# Patient Record
Sex: Male | Born: 1955 | Race: Black or African American | Hispanic: No | Marital: Married | Smoking: Former smoker
Health system: Southern US, Community
[De-identification: ages and names within clinical notes are randomized; demographics above are authoritative.]

## PROBLEM LIST (undated history)

## (undated) DIAGNOSIS — Z932 Ileostomy status: Secondary | ICD-10-CM

## (undated) DIAGNOSIS — K5090 Crohn's disease, unspecified, without complications: Secondary | ICD-10-CM

## (undated) DIAGNOSIS — Z8739 Personal history of other diseases of the musculoskeletal system and connective tissue: Secondary | ICD-10-CM

## (undated) DIAGNOSIS — C801 Malignant (primary) neoplasm, unspecified: Secondary | ICD-10-CM

## (undated) DIAGNOSIS — M1990 Unspecified osteoarthritis, unspecified site: Secondary | ICD-10-CM

## (undated) DIAGNOSIS — N210 Calculus in bladder: Secondary | ICD-10-CM

## (undated) DIAGNOSIS — R569 Unspecified convulsions: Secondary | ICD-10-CM

## (undated) DIAGNOSIS — Z8579 Personal history of other malignant neoplasms of lymphoid, hematopoietic and related tissues: Secondary | ICD-10-CM

## (undated) DIAGNOSIS — E119 Type 2 diabetes mellitus without complications: Secondary | ICD-10-CM

## (undated) DIAGNOSIS — Z87442 Personal history of urinary calculi: Secondary | ICD-10-CM

## (undated) HISTORY — PX: COLONOSCOPY: SHX174

## (undated) HISTORY — DX: Type 2 diabetes mellitus without complications: E11.9

## (undated) HISTORY — PX: OTHER SURGICAL HISTORY: SHX169

---

## 1981-11-13 HISTORY — PX: OTHER SURGICAL HISTORY: SHX169

## 1981-11-13 HISTORY — PX: ILEOCECETOMY: SHX5857

## 1984-11-13 HISTORY — PX: OTHER SURGICAL HISTORY: SHX169

## 2000-02-13 ENCOUNTER — Encounter: Payer: Self-pay | Admitting: Gastroenterology

## 2000-02-13 ENCOUNTER — Ambulatory Visit (HOSPITAL_COMMUNITY): Admission: RE | Admit: 2000-02-13 | Discharge: 2000-02-13 | Payer: Self-pay | Admitting: Gastroenterology

## 2000-03-21 ENCOUNTER — Encounter (HOSPITAL_COMMUNITY): Admission: RE | Admit: 2000-03-21 | Discharge: 2000-06-19 | Payer: Self-pay | Admitting: Gastroenterology

## 2000-07-30 ENCOUNTER — Encounter: Payer: Self-pay | Admitting: Emergency Medicine

## 2000-07-30 ENCOUNTER — Emergency Department (HOSPITAL_COMMUNITY): Admission: EM | Admit: 2000-07-30 | Discharge: 2000-07-30 | Payer: Self-pay | Admitting: Emergency Medicine

## 2000-07-31 ENCOUNTER — Encounter: Payer: Self-pay | Admitting: Emergency Medicine

## 2000-08-01 ENCOUNTER — Encounter (HOSPITAL_COMMUNITY): Admission: RE | Admit: 2000-08-01 | Discharge: 2000-10-30 | Payer: Self-pay | Admitting: Gastroenterology

## 2000-08-13 ENCOUNTER — Encounter: Payer: Self-pay | Admitting: Gastroenterology

## 2000-08-13 ENCOUNTER — Inpatient Hospital Stay (HOSPITAL_COMMUNITY): Admission: EM | Admit: 2000-08-13 | Discharge: 2000-08-16 | Payer: Self-pay | Admitting: Gastroenterology

## 2000-10-16 ENCOUNTER — Inpatient Hospital Stay (HOSPITAL_COMMUNITY): Admission: EM | Admit: 2000-10-16 | Discharge: 2000-12-20 | Payer: Self-pay | Admitting: Gastroenterology

## 2000-10-16 ENCOUNTER — Encounter (INDEPENDENT_AMBULATORY_CARE_PROVIDER_SITE_OTHER): Payer: Self-pay | Admitting: Specialist

## 2000-10-16 ENCOUNTER — Encounter (INDEPENDENT_AMBULATORY_CARE_PROVIDER_SITE_OTHER): Payer: Self-pay

## 2000-10-16 ENCOUNTER — Encounter: Payer: Self-pay | Admitting: Gastroenterology

## 2000-10-17 ENCOUNTER — Encounter: Payer: Self-pay | Admitting: Gastroenterology

## 2000-10-19 ENCOUNTER — Encounter: Payer: Self-pay | Admitting: Gastroenterology

## 2000-10-29 HISTORY — PX: OTHER SURGICAL HISTORY: SHX169

## 2000-10-30 ENCOUNTER — Encounter: Payer: Self-pay | Admitting: General Surgery

## 2000-11-04 ENCOUNTER — Encounter: Payer: Self-pay | Admitting: Gastroenterology

## 2000-11-04 HISTORY — PX: OTHER SURGICAL HISTORY: SHX169

## 2000-11-05 ENCOUNTER — Encounter: Payer: Self-pay | Admitting: General Surgery

## 2000-11-06 ENCOUNTER — Encounter: Payer: Self-pay | Admitting: General Surgery

## 2000-11-07 ENCOUNTER — Encounter: Payer: Self-pay | Admitting: General Surgery

## 2000-11-08 ENCOUNTER — Encounter: Payer: Self-pay | Admitting: General Surgery

## 2000-11-09 ENCOUNTER — Encounter: Payer: Self-pay | Admitting: General Surgery

## 2000-11-10 ENCOUNTER — Encounter: Payer: Self-pay | Admitting: General Surgery

## 2000-11-11 ENCOUNTER — Encounter: Payer: Self-pay | Admitting: General Surgery

## 2000-11-20 ENCOUNTER — Encounter: Payer: Self-pay | Admitting: Surgery

## 2000-12-06 ENCOUNTER — Encounter: Payer: Self-pay | Admitting: Surgery

## 2000-12-07 ENCOUNTER — Encounter: Payer: Self-pay | Admitting: Surgery

## 2000-12-15 ENCOUNTER — Encounter: Payer: Self-pay | Admitting: Surgery

## 2000-12-20 ENCOUNTER — Inpatient Hospital Stay: Admission: RE | Admit: 2000-12-20 | Discharge: 2001-01-02 | Payer: Self-pay | Admitting: Surgery

## 2001-01-17 ENCOUNTER — Emergency Department (HOSPITAL_COMMUNITY): Admission: EM | Admit: 2001-01-17 | Discharge: 2001-01-17 | Payer: Self-pay | Admitting: Emergency Medicine

## 2001-05-21 ENCOUNTER — Emergency Department (HOSPITAL_COMMUNITY): Admission: EM | Admit: 2001-05-21 | Discharge: 2001-05-21 | Payer: Self-pay | Admitting: Emergency Medicine

## 2001-05-21 ENCOUNTER — Encounter: Payer: Self-pay | Admitting: Emergency Medicine

## 2001-05-29 ENCOUNTER — Ambulatory Visit (HOSPITAL_COMMUNITY): Admission: RE | Admit: 2001-05-29 | Discharge: 2001-05-29 | Payer: Self-pay | Admitting: Gastroenterology

## 2001-05-29 ENCOUNTER — Encounter: Payer: Self-pay | Admitting: Gastroenterology

## 2001-07-10 ENCOUNTER — Encounter (HOSPITAL_COMMUNITY): Admission: RE | Admit: 2001-07-10 | Discharge: 2001-10-08 | Payer: Self-pay | Admitting: Gastroenterology

## 2002-03-05 ENCOUNTER — Encounter (HOSPITAL_COMMUNITY): Admission: RE | Admit: 2002-03-05 | Discharge: 2002-06-03 | Payer: Self-pay | Admitting: Gastroenterology

## 2002-06-19 ENCOUNTER — Encounter (HOSPITAL_COMMUNITY): Admission: RE | Admit: 2002-06-19 | Discharge: 2002-09-17 | Payer: Self-pay | Admitting: Gastroenterology

## 2002-10-07 ENCOUNTER — Encounter (HOSPITAL_COMMUNITY): Admission: RE | Admit: 2002-10-07 | Discharge: 2003-01-05 | Payer: Self-pay | Admitting: Gastroenterology

## 2002-11-23 ENCOUNTER — Encounter: Payer: Self-pay | Admitting: Emergency Medicine

## 2002-11-23 ENCOUNTER — Emergency Department (HOSPITAL_COMMUNITY): Admission: EM | Admit: 2002-11-23 | Discharge: 2002-11-23 | Payer: Self-pay | Admitting: Emergency Medicine

## 2002-12-01 ENCOUNTER — Ambulatory Visit (HOSPITAL_BASED_OUTPATIENT_CLINIC_OR_DEPARTMENT_OTHER): Admission: RE | Admit: 2002-12-01 | Discharge: 2002-12-02 | Payer: Self-pay | Admitting: Urology

## 2003-03-31 ENCOUNTER — Encounter (HOSPITAL_COMMUNITY): Admission: RE | Admit: 2003-03-31 | Discharge: 2003-06-29 | Payer: Self-pay | Admitting: Gastroenterology

## 2003-08-05 ENCOUNTER — Encounter (HOSPITAL_COMMUNITY): Admission: RE | Admit: 2003-08-05 | Discharge: 2003-11-03 | Payer: Self-pay | Admitting: Gastroenterology

## 2003-10-07 ENCOUNTER — Ambulatory Visit (HOSPITAL_BASED_OUTPATIENT_CLINIC_OR_DEPARTMENT_OTHER): Admission: RE | Admit: 2003-10-07 | Discharge: 2003-10-07 | Payer: Self-pay | Admitting: Urology

## 2003-10-07 ENCOUNTER — Ambulatory Visit (HOSPITAL_COMMUNITY): Admission: RE | Admit: 2003-10-07 | Discharge: 2003-10-07 | Payer: Self-pay | Admitting: Urology

## 2003-10-07 HISTORY — PX: OTHER SURGICAL HISTORY: SHX169

## 2003-12-10 ENCOUNTER — Encounter (HOSPITAL_COMMUNITY): Admission: RE | Admit: 2003-12-10 | Discharge: 2004-03-09 | Payer: Self-pay | Admitting: Gastroenterology

## 2004-08-02 ENCOUNTER — Ambulatory Visit (HOSPITAL_COMMUNITY): Admission: RE | Admit: 2004-08-02 | Discharge: 2004-08-02 | Payer: Self-pay | Admitting: Gastroenterology

## 2005-04-06 ENCOUNTER — Encounter (HOSPITAL_COMMUNITY): Admission: RE | Admit: 2005-04-06 | Discharge: 2005-07-05 | Payer: Self-pay | Admitting: Gastroenterology

## 2005-04-20 ENCOUNTER — Ambulatory Visit (HOSPITAL_COMMUNITY): Admission: RE | Admit: 2005-04-20 | Discharge: 2005-04-20 | Payer: Self-pay | Admitting: Urology

## 2005-04-24 ENCOUNTER — Ambulatory Visit (HOSPITAL_COMMUNITY): Admission: RE | Admit: 2005-04-24 | Discharge: 2005-04-24 | Payer: Self-pay | Admitting: Urology

## 2005-04-24 ENCOUNTER — Ambulatory Visit (HOSPITAL_BASED_OUTPATIENT_CLINIC_OR_DEPARTMENT_OTHER): Admission: RE | Admit: 2005-04-24 | Discharge: 2005-04-24 | Payer: Self-pay | Admitting: Urology

## 2005-08-23 ENCOUNTER — Ambulatory Visit (HOSPITAL_COMMUNITY): Admission: RE | Admit: 2005-08-23 | Discharge: 2005-08-23 | Payer: Self-pay | Admitting: Gastroenterology

## 2005-09-19 ENCOUNTER — Encounter: Admission: RE | Admit: 2005-09-19 | Discharge: 2005-09-19 | Payer: Self-pay | Admitting: Gastroenterology

## 2005-11-12 ENCOUNTER — Inpatient Hospital Stay (HOSPITAL_COMMUNITY): Admission: EM | Admit: 2005-11-12 | Discharge: 2005-11-14 | Payer: Self-pay | Admitting: Emergency Medicine

## 2006-12-29 ENCOUNTER — Emergency Department (HOSPITAL_COMMUNITY): Admission: EM | Admit: 2006-12-29 | Discharge: 2006-12-29 | Payer: Self-pay | Admitting: Emergency Medicine

## 2009-02-02 ENCOUNTER — Emergency Department (HOSPITAL_COMMUNITY): Admission: EM | Admit: 2009-02-02 | Discharge: 2009-02-02 | Payer: Self-pay | Admitting: Emergency Medicine

## 2009-02-17 ENCOUNTER — Inpatient Hospital Stay (HOSPITAL_COMMUNITY): Admission: EM | Admit: 2009-02-17 | Discharge: 2009-02-19 | Payer: Self-pay | Admitting: Emergency Medicine

## 2011-02-22 LAB — URINE CULTURE
Colony Count: NO GROWTH
Culture: NO GROWTH

## 2011-02-22 LAB — TSH: TSH: 1.68 u[IU]/mL (ref 0.350–4.500)

## 2011-02-22 LAB — LIPID PANEL
Cholesterol: 167 mg/dL (ref 0–200)
HDL: 47 mg/dL (ref >39)
LDL Cholesterol: 63 mg/dL (ref 0–99)
Total CHOL/HDL Ratio: 3.6 RATIO
Triglycerides: 287 mg/dL — ABNORMAL HIGH (ref <150)
VLDL: 57 mg/dL — ABNORMAL HIGH (ref 0–40)

## 2011-02-22 LAB — URINALYSIS, ROUTINE W REFLEX MICROSCOPIC
Bilirubin Urine: NEGATIVE
Glucose, UA: NEGATIVE mg/dL
Nitrite: NEGATIVE
Protein, ur: NEGATIVE mg/dL
Specific Gravity, Urine: 1.025 (ref 1.005–1.030)
Urobilinogen, UA: 0.2 mg/dL (ref 0.0–1.0)
pH: 5.5 (ref 5.0–8.0)

## 2011-02-22 LAB — POCT I-STAT, CHEM 8
BUN: 61 mg/dL — ABNORMAL HIGH (ref 6–23)
Calcium, Ion: 1.08 mmol/L — ABNORMAL LOW (ref 1.12–1.32)
Chloride: 109 mEq/L (ref 96–112)
Creatinine, Ser: 2.6 mg/dL — ABNORMAL HIGH (ref 0.4–1.5)
Glucose, Bld: 115 mg/dL — ABNORMAL HIGH (ref 70–99)
HCT: 54 % — ABNORMAL HIGH (ref 39.0–52.0)
Hemoglobin: 18.4 g/dL — ABNORMAL HIGH (ref 13.0–17.0)
Potassium: 5.5 mEq/L — ABNORMAL HIGH (ref 3.5–5.1)
Sodium: 130 mEq/L — ABNORMAL LOW (ref 135–145)
TCO2: 18 mmol/L (ref 0–100)

## 2011-02-22 LAB — BASIC METABOLIC PANEL
BUN: 10 mg/dL (ref 6–23)
CO2: 23 mEq/L (ref 19–32)
CO2: 26 mEq/L (ref 19–32)
Calcium: 7.6 mg/dL — ABNORMAL LOW (ref 8.4–10.5)
Chloride: 105 mEq/L (ref 96–112)
Creatinine, Ser: 0.85 mg/dL (ref 0.4–1.5)
Creatinine, Ser: 0.88 mg/dL (ref 0.4–1.5)
GFR calc Af Amer: >60 mL/min (ref >60)

## 2011-02-22 LAB — CBC
HCT: 45.8 % (ref 39.0–52.0)
HCT: 48.9 % (ref 39.0–52.0)
Hemoglobin: 15.1 g/dL (ref 13.0–17.0)
Hemoglobin: 16.2 g/dL (ref 13.0–17.0)
MCHC: 32.8 g/dL (ref 30.0–36.0)
MCHC: 32.9 g/dL (ref 30.0–36.0)
MCHC: 33.2 g/dL (ref 30.0–36.0)
MCHC: 33.2 g/dL (ref 30.0–36.0)
MCV: 88.3 fL (ref 78.0–100.0)
MCV: 89 fL (ref 78.0–100.0)
MCV: 89.5 fL (ref 78.0–100.0)
Platelets: 150 10*3/uL (ref 150–400)
Platelets: 191 10*3/uL (ref 150–400)
Platelets: 252 10*3/uL (ref 150–400)
RBC: 4.19 MIL/uL — ABNORMAL LOW (ref 4.22–5.81)
RBC: 5.15 MIL/uL (ref 4.22–5.81)
RBC: 5.54 MIL/uL (ref 4.22–5.81)
RDW: 14.2 % (ref 11.5–15.5)
RDW: 14.9 % (ref 11.5–15.5)
RDW: 14.9 % (ref 11.5–15.5)
WBC: 4.6 10*3/uL (ref 4.0–10.5)
WBC: 4.8 10*3/uL (ref 4.0–10.5)
WBC: 8.3 10*3/uL (ref 4.0–10.5)

## 2011-02-22 LAB — CULTURE, BLOOD (ROUTINE X 2)
Culture: NO GROWTH
Culture: NO GROWTH

## 2011-02-22 LAB — COMPREHENSIVE METABOLIC PANEL
ALT: 23 U/L (ref 0–53)
AST: 28 U/L (ref 0–37)
Albumin: 4 g/dL (ref 3.5–5.2)
Alkaline Phosphatase: 87 U/L (ref 39–117)
BUN: 30 mg/dL — ABNORMAL HIGH (ref 6–23)
CO2: 23 mEq/L (ref 19–32)
Calcium: 8.7 mg/dL (ref 8.4–10.5)
Chloride: 104 mEq/L (ref 96–112)
Creatinine, Ser: 1.32 mg/dL (ref 0.4–1.5)
GFR calc Af Amer: >60 mL/min (ref >60)
GFR calc non Af Amer: 57 mL/min — ABNORMAL LOW (ref >60)
Glucose, Bld: 132 mg/dL — ABNORMAL HIGH (ref 70–99)
Potassium: 4.2 mEq/L (ref 3.5–5.1)
Sodium: 135 mEq/L (ref 135–145)
Total Bilirubin: 1 mg/dL (ref 0.3–1.2)
Total Protein: 6.8 g/dL (ref 6.0–8.3)

## 2011-02-22 LAB — POTASSIUM: Potassium: 5.8 mEq/L — ABNORMAL HIGH (ref 3.5–5.1)

## 2011-02-22 LAB — URINE MICROSCOPIC-ADD ON

## 2011-02-22 LAB — DIFFERENTIAL
Basophils Absolute: 0 10*3/uL (ref 0.0–0.1)
Basophils Relative: 0 % (ref 0–1)
Eosinophils Absolute: 0.2 10*3/uL (ref 0.0–0.7)
Eosinophils Relative: 3 % (ref 0–5)
Lymphocytes Relative: 22 % (ref 12–46)
Lymphs Abs: 1.9 10*3/uL (ref 0.7–4.0)
Monocytes Absolute: 0.9 10*3/uL (ref 0.1–1.0)
Monocytes Relative: 11 % (ref 3–12)
Neutro Abs: 5.3 10*3/uL (ref 1.7–7.7)
Neutrophils Relative %: 64 % (ref 43–77)

## 2011-02-22 LAB — GLUCOSE, CAPILLARY: Glucose-Capillary: 120 mg/dL — ABNORMAL HIGH (ref 70–99)

## 2011-02-22 LAB — BRAIN NATRIURETIC PEPTIDE: Pro B Natriuretic peptide (BNP): <30 pg/mL (ref 0.0–100.0)

## 2011-02-22 LAB — STONE ANALYSIS: Stone Weight KSTONE: 0.084 g

## 2011-02-23 LAB — URINALYSIS, ROUTINE W REFLEX MICROSCOPIC
Nitrite: NEGATIVE
Specific Gravity, Urine: 1.02 (ref 1.005–1.030)
Urobilinogen, UA: 0.2 mg/dL (ref 0.0–1.0)

## 2011-02-23 LAB — DIFFERENTIAL
Basophils Absolute: 0 10*3/uL (ref 0.0–0.1)
Basophils Relative: 0 % (ref 0–1)
Eosinophils Absolute: 0 10*3/uL (ref 0.0–0.7)
Neutro Abs: 11.9 10*3/uL — ABNORMAL HIGH (ref 1.7–7.7)
Neutrophils Relative %: 88 % — ABNORMAL HIGH (ref 43–77)

## 2011-02-23 LAB — COMPREHENSIVE METABOLIC PANEL
Alkaline Phosphatase: 95 U/L (ref 39–117)
BUN: 29 mg/dL — ABNORMAL HIGH (ref 6–23)
CO2: 18 mEq/L — ABNORMAL LOW (ref 19–32)
Chloride: 98 mEq/L (ref 96–112)
GFR calc non Af Amer: 34 mL/min — ABNORMAL LOW (ref >60)
Glucose, Bld: 178 mg/dL — ABNORMAL HIGH (ref 70–99)
Potassium: 5 mEq/L (ref 3.5–5.1)
Total Bilirubin: 0.9 mg/dL (ref 0.3–1.2)

## 2011-02-23 LAB — CBC
HCT: 52.5 % — ABNORMAL HIGH (ref 39.0–52.0)
Hemoglobin: 17.4 g/dL — ABNORMAL HIGH (ref 13.0–17.0)
RBC: 5.92 MIL/uL — ABNORMAL HIGH (ref 4.22–5.81)
RDW: 14.9 % (ref 11.5–15.5)
WBC: 13.6 10*3/uL — ABNORMAL HIGH (ref 4.0–10.5)

## 2011-02-23 LAB — URINE MICROSCOPIC-ADD ON

## 2011-03-28 NOTE — H&P (Signed)
NAMEMUTASIM, TUCKEY              ACCOUNT NO.:  [id number]   MEDICAL RECORD NO.:  [id number]          PATIENT TYPE:  INP   LOCATION:  1515                         FACILITY:  [facility]   PHYSICIAN:  Lonia Blood, M.D.      DATE OF BIRTH:  01-Dec-1955   DATE OF ADMISSION:  02/16/2009  DATE OF DISCHARGE:                              HISTORY & PHYSICAL   PRIMARY CARE PHYSICIAN:  The patient is unassigned.   GASTROENTEROLOGIST:  Llana Aliment. Randa Evens, M.D.   UROLOGYLoraine Leriche C. Vernie Ammons, M.D.   PRESENTING COMPLAINT:  Abdominal pain, vomiting and diarrhea.   HISTORY OF PRESENT ILLNESS:  The patient is a 55 year old gentleman with  known history of Crohn disease as well as kidney stones, status post  multiple procedures.  He apparently has been doing okay until the last 3  days when he started having severe abdominal pain.  Pain is rated as  8/10, localized to the left flank radiating to the front, associated  with some nausea, vomiting and then diarrhea.  He denied any fever or  chills.  Pain is unbearable and the patient decided to come to the  emergency room..  Pain felt like his similar kidney stone but also has  had similar pains in the past when he had a flare of his Crohn disease.  He denied any melena, denied any hematemesis.  He denied any specific  symptoms.  He denied taking NSAIDS and no alcohol intake recently.   PAST MEDICAL HISTORY:  Significant for:  1. History of Crohn disease.  2. History of kidney stones.  3. Previous history of tobacco abuse.  4. 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   ALLERGIES:  He has no known drug allergies.   MEDICATIONS:  Potassium citrate 40 mEq daily.   SOCIAL HISTORY:  The patient lives in [place] with his wife.  He is a  previous smoker, currently not smoking.   FAMILY HISTORY:  Significant for coronary artery disease, diabetes,  hypertension and colon cancer.   REVIEW OF SYSTEMS:  Mainly pain, rated as 8/10; otherwise 12-point  review of systems is per HPI.   PHYSICAL EXAMINATION:  Temperature is 98.0, blood pressure 107/73, pulse  1064, pulse 98, respiratory rate 18, saturation 97% on room air.  GENERAL: The patient is awake, alert, oriented in mild distress due to  pain.  HEENT: PERRL.  EOMI.  NECK:  Supple.  No JVD, no lymphadenopathy.  RESPIRATORY:  He has good air entry bilaterally.  No wheezes, no rales.  CARDIOVASCULAR SYSTEM:  S1, S2; no murmur.  ABDOMEN:  Soft, nontender with ileostomy bag in place, no leaks.  Nontender with positive bowel sounds.  EXTREMITIES: No edema, cyanosis or clubbing.   LABORATORY DATA:  His white count is 8.3, hemoglobin 16.2, platelet  count 252 with normal differential.  Urinalysis shows small leukocyte  esterase.  Urine wbc's 3-6, rbc's 7-10, many bacteria and some granular  casts.  Sodium 130, potassium 5.5, chloride 109, BUN 16 and creatinine  2.6, glucose 115, calcium 1.08.  CT abdomen and pelvis showed left  greater than right  renal collecting system calculi.  There was a large  stone in the lower pole of left kidney which measures over 1 cm,  significantly unchanged from previous scans.  CT pelvis also showed no  significant change from previous CT with mesenteric adenitis.   ASSESSMENT:  This a 55 year old gentleman with known history of  bilateral kidney stones and Crohn disease presenting with nausea and  vomiting, abdominal pain and diarrhea.  The patient also has elements of  urinary tract infection, hyperkalemia, acute renal failure with symptoms  of azotemia, dehydration with hyponatremia.  He still has multiple  stones in place.   PLAN:  1. Abdominal pain, nausea, vomiting and diarrhea.  The differentials      here are between acute Crohn disease flare versus nephrolithiasis.      It is slightly favoring left nephrolithiasis based on the patient's      symptoms as well as CT findings.  He has had some stones, passing      them today in his urine, and he  has had been broken than before by      Dr. Vernie Ammons.  Our goal now will be to admit the patient, hydrate      him, give pain control and get urology and GI consult for further      advice.  2. Multiple kidney stones.  Again this is being followed by Dr.      Vernie Ammons and will get his opinion on what to do next.  3. Acute renal failure.  This looks like obstructive versus prerenal.      Will hydrate the patient and follow his BUN and creatinine.  I will      get a renal ultrasound once more looking for hydronephrosis and      depending on what urology decides to do after the ultrasound will      proceed.  4. Urinary tract infection.  I will put him on Cipro and suspect this      is related to the stones.  Will get urine culture as well as blood      cultures.  5. Ileostomy.  Will continue ileostomy care during hospitalization.   Further treatment will depend on the patient's response to these  measures.      Lonia Blood, M.D.  Electronically Signed     LG/MEDQ  D:  02/17/2009  T:  02/17/2009  Job:  [id number]

## 2011-03-28 NOTE — Consult Note (Signed)
NAMEDARRIL, PATRIARCA              ACCOUNT NO.:  [id number]   MEDICAL RECORD NO.:  [id number]          PATIENT TYPE:  INP   LOCATION:  1515                         FACILITY:  [facility]   PHYSICIAN:  John C. Madilyn Fireman, M.D.    DATE OF BIRTH:  June 11, 1956   DATE OF CONSULTATION:  02/17/2009  DATE OF DISCHARGE:                                 CONSULTATION   REASON FOR CONSULTATION:  Abdominal pain, nausea and vomiting, history  of Crohn's disease.   HISTORY OF PRESENT ILLNESS:  The patient is a 55 year old black male  with longstanding Crohn's disease and recurrent symptomatic  nephrolithiasis, who had presented to the emergency room on February 02, 2009, with left flank pain, nausea and vomiting with CT scan showing  bilateral left greater than right kidney stones and some nonobstructing  left ureteral stones.  The patient was hydrated and treated  symptomatically and given follow-up with Dr. Vernie Ammons, who has done  multiple stone extracting procedures and placed stents in the past, but  similar symptoms recurred on February 14, 2009, with mainly left flank pain  with persistent nausea and vomiting until he presented back to the  emergency room today.  Dr. Vernie Ammons was called and felt that there was no  urologic intervention needed tonight.  The [facility] was  called to admit the patient and requested an urgent GI consult.   The patient is followed by Dr. Randa Evens for Crohn's disease.  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  He has since been followed  by Dr. Randa Evens over the years and according to the patient has not had  any recent active documented disease recurrence and is on no medicines  for his Crohn's disease.  He reportedly had a colonoscopy in about 2006,  which to his knowledge was relatively unremarkable.  He has had  multiple  episodes of symptomatic nephrolithiasis since then and has had multiple  procedures done and had been followed by Dr. Vernie Ammons since 2001.  On  February 02, 2009, he had a creatinine of 2.2 with a BUN in the 20s, but  today's BUN is 61 with creatinine of 2.6.  He had 3-6 RBCs and 6-12 WBCs  in his urine.  His serum WBC count was 6 and hemoglobin was 16.  CT scan  on this admission was similar to his previous one with the exception  that the small UPJ stones on the left were not seen and it was noted  that he had stable borderline mesenteric adenopathy unchanged from  previous studies, otherwise no obvious active Crohn's disease.   PAST MEDICAL HISTORY:  1. In addition to the above, aseptic necrosis of bilateral hips.  2. Mycosis fungoides.   MEDICATIONS:  Potassium.   ALLERGIES:  NONE.   PHYSICAL EXAMINATION:  GENERAL:  A well-developed, well-nourished black  male, alert, pleasant, currently in no acute distress.  HEART:  Regular  rate and rhythm without murmur.  LUNGS:  Clear.  ABDOMEN:  Soft, distended, somewhat asymmetric with a well-healed  midline surgical scar and an ileostomy in the right mid abdomen with  diffuse surrounding protrusion, probably indicating a diffuse mild  abdominal wall hernia.  Bowel sounds are positive.  There is mild  diffuse tenderness.  There is brown liquid stool in the ostomy bag.  There is mild to moderate CVA tenderness on the left.   IMPRESSION:  Likely symptomatic kidney stones, rule out active Crohn's  disease or small bowel obstruction or viral gastroenteritis.   PLAN:  1. Admit.  2. Hydrate.  3. Pain control.  4. Strain urine.  5. Urology consult  6. We will follow with you.           ______________________________  Everardo All. Madilyn Fireman, M.D.     JCH/MEDQ  D:  02/17/2009  T:  02/17/2009  Job:  [id number]   cc:   Loraine Leriche C. Vernie Ammons, M.D.  Fax: [phone]   Llana Aliment. Malon Kindle., M.D.  Fax: [phone]

## 2011-03-28 NOTE — Discharge Summary (Signed)
[name], [name]              ACCOUNT NO.:  [id number]   MEDICAL RECORD NO.:  [id number]          PATIENT TYPE:  INP   LOCATION:  1515                         FACILITY:  [facility]   PHYSICIAN:  Isidor Holts, M.D.  DATE OF BIRTH:  [birth year]   DATE OF ADMISSION:  02/16/2009  DATE OF DISCHARGE:  02/19/2009                               DISCHARGE SUMMARY   PRIMARY GASTROENTEROLOGIST:  Dr. Carman Ching.   PRIMARY UROLOGIST:  Dr. Ihor Gully.   DISCHARGE DIAGNOSES:  1. Symptomatic urolithiasis.  2. Acute pyelonephritis.  3. Crohn disease/diarrhea.  4. Dehydration/acute renal failure.   DISCHARGE MEDICATIONS:  1. Potassium citrate 40 mEq p.o. q.i.d.  2. Ciprofloxacin 500 mg p.o. b.i.d. for 4 days, from February 20, 2009.  3. Vicodin (5/325) one p.o. p.r.n. q. 4h. A total of 42 pills have      been dispensed.   PROCEDURES:  1. Abdominal/pelvic CT scan done February 16, 2009.  This showed left      greater than right bilateral renal calculi without hydronephrosis      or ureteric stone.  Right-sided loop colostomy without acute      complication.  Mesenteric adenopathy which is likely reactive and      related to Crohn disease in the pelvis, although the pelvic      portions of the exam are mildly degraded by artifact.  The      previously described bilateral ureterovesical junction stones are      felt to have passed.  Otherwise, no acute pelvic process.      Bilateral femoral head avascular necrosis.  2. Renal ultrasound scan, done February 17, 2009.  This was negative for      hydronephrosis.  There was left nephrolithiasis.   CONSULTATIONS:  1. Dr. Dorena Cookey, gastroenterologist.  2. Dr. Sherron Monday, urologist.   ADMISSION HISTORY:  As in H and P notes of February 16, 2009, dictated by  Dr. Lonia Blood. However, in brief, this is a 55 year old male, with  known history of Crohn disease, status post ileostomy, history of  urolithiasis, bilateral hip aseptic necrosis, presenting with  abdominal  pain, vomiting and diarrhea of approximately 3 days' duration.  He  described his pain as localized to the left flank, radiating to the  front, associated with nausea, vomiting and then subsequently diarrhea,  without fever or chills.  He eventually presented to the emergency  department and was admitted for further evaluation, investigation and  management.   CLINICAL COURSE.:  1. Symptomatic urolithiasis:  For details of presentation, refer to      admission history above.  The patient underwent pelvic/abdominal CT      scan which demonstrated bilateral urolithiasis and also patient,      during the course of his hospitalization, passed several urinary      stones.  Urology consultation was kindly provided by Dr.      Sherron Monday, who opined that this patient was spontaneously passing      stones, and as such, there was no need for invasive procedure at      the present time, particularly  as imaging studies demonstrated no      evidence of hydronephrosis.  He recommended analgesic medication      and continued follow-up on outpatient basis with primary urologist,      Dr. Vernie Ammons.   1. Acute pyelonephritis:  The patient's urinalysis demonstrated      positive urinary sediment, consistent with urinary tract infection.      He was managed with intravenous fluid hydration and parenteral      Ciprofloxacin.  Urine cultures, however, showed no growth.  Blood      cultures remained negative.  The patient however, felt considerably      better by February 19, 2009, on day #3 of Ciprofloxacin.  He has been      transitioned to oral Ciprofloxacin to complete a further 4 days of      antibiotic therapy, i.e. total of 7 days antibiotic therapy.   1. Crohn disease:  The patient has a known history of Crohn disease,      and is under the care of Dr. Carman Ching, gastroenterologist.      Because he did have diarrhea as part of his presenting symptoms, it      was felt that this may be  suspicious for Crohn's flare.      Gastroenterology consultation was called, which was kindly provided      by Dr. Dorena Cookey.  For details of that consultation, refer to      consultation notes of February 17, 2009.  He opined however, the      patient did not have active Crohn disease.  The patient's diarrhea      responded to bowel rest, as well as intravenous fluid hydration,      and then subsequently subsided.  It is possible that this may have      been simply a viral syndrome.   1. Dehydration/acute renal failure.  This was secondary to #3 above.      The patient, at time of presentation, had a BUN of 16, creatinine      of 2.6.  He was managed with intravenous fluid hydration and we      were pleased to note that, by February 19, 2009, BUN had normalized at      4 with a creatinine of 0.8, i.e. acute renal failure had resolved.   DISPOSITION:  The patient on February 19, 2009, was asymptomatic and keen  to be discharged.  Diet had been advanced on February 18, 2009, and the  patient tolerated this without any deleterious effects.  He was  therefore discharged accordingly, following clearance by  gastroenterologist and urologist.   DIET:  No restrictions.   ACTIVITY:  As tolerated.   FOLLOW-UP INSTRUCTIONS:  The patient is to follow up with Dr. Randa Evens,  his primary gastroenterologist, for prior scheduled appointment.  He is  also to follow up with Dr. Vernie Ammons, urologist, in the coming week.  He  has been instructed to call for an appointment.  Telephone number is [phone].      Isidor Holts, M.D.  Electronically Signed     CO/MEDQ  D:  02/19/2009  T:  02/19/2009  Job:  [id number]   cc:   Fayrene Fearing L. Malon Kindle., M.D.  Fax: [phone]   Veverly Fells. Vernie Ammons, M.D.  Fax: [phone]

## 2011-03-31 NOTE — Procedures (Signed)
[facility]  Patient:    [name], [name]                     MRN: [id number] Proc. Date: 10/24/00 Adm. Date:  [id number] Attending:  Orland Mustard CC:         Thornton [place]. Daphine Deutscher, M.D.   Procedure Report  PROCEDURE:  Colonoscopy.  MEDICATIONS:  Fentanyl 125 mcg, Versed 10 mg IV.  INDICATIONS:  A nice gentleman with a history of Crohns who has come with persistent severe abdominal pain.  The patient is doing better in the hospital on liquids.  The small bowel does show marked activity consistent with Crohns.  This is done to evaluate his colon.  He has had previous ileocecal surgery.  It was not clear exactly how much surgery he has had.  DESCRIPTION OF PROCEDURE:  The procedure had been explained to the patient and consent obtained.  With the patient in the left lateral decubitus, the adult video colonoscope was inserted and advanced under direct visualization.  The prep was quite good.  We were able to reach the right colon and cecum.  There was an area that was somewhat stenotic that appeared to be the anastomosis.  I could not get the scope in.  It was estimated that the great majority of the colon was still left.  His cecum may have been removed, but clearly the majority of the colon was still present.  The scope was withdrawn, and biopsies of the right and proximal transverse colon were taken and placed in jar #1.  Random biopsies of the left colon were placed in jar #2.  There was no evidence of active Crohns throughout the colon grossly.  The patient tolerated the procedure well and was maintained on low-flow oxygen and pulse oximetry throughout the procedure.  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  PLAN:  Will discuss further work-up with Dr. Daphine Deutscher. DD:  10/24/00 TD:  10/24/00 Job: [id number]

## 2011-03-31 NOTE — H&P (Signed)
[facility]  Patient:    [name], [name]                     MRN: [id number] Adm. Date:  [id number] Attending:  Orland Mustard CC:         [name], M.D.  [name]. [name], M.D.   History and Physical  DATE OF BIRTH:  [birth year]  REASON FOR ADMISSION:  Nausea, vomiting, abdominal pain, and dehydration.  HISTORY OF PRESENT ILLNESS:  The patient is a 55 year old gentleman with Crohns disease for a number of years.  He had this diagnosed in 1983 and has been doing reasonably well since that time.  He has not had any other surgery. He has had increasing symptoms over the past six months.  In April of this year, he had active disease in the small intestines by small bowel series.  He received Remicade two times in May of this year.  Then in September became ill and had to be hospitalized with increasing abdominal pain, bloating, and vomiting.  In October, he received Remicade in the hospital.  After his hospitalization with bowel rest and the Remicade dose he received in the hospital, he improved.  He was subsequently seen in the office on August 31, 2000, about two weeks after discharge, was back to a low-residue diet, and was feeling much better.  According to the patient and his wife, he has really continued to decline since several days following that visit with increasing pain.  It has been worsened with eating.  He has been taking basically liquid. He felt fatigued.  He just generally had a multitude of constitutional symptoms.  He has felt so bad that he has had to force himself to get out of bed to go to work.  He has been able to do that until recently.  He has had nausea, vomiting, hiccuping, belching, and cramping abdominal pain with some loose stools, but really had not been eating very much over the past four days.  He has lost weight and feels weak and dizzy.  He came into the office today with these symptoms.  CURRENT  MEDICATIONS: 1. Pentasa four tablets q.i.d. 2. Prednisone 20 mg b.i.d. 3. Imuran 50 mg b.i.d. 4. Resource daily. 5. Ultram p.r.n. for pain.  ALLERGIES:  He has not drug allergies.  PAST MEDICAL HISTORY:  1. 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  He has received a bone graft in the left hip done at [facility].  2. Mycoses fungoides followed by [name]. [name], M.D., at [facility].  FAMILY HISTORY:  Remarkable for Crohns disease.  SOCIAL HISTORY:  The patient works at [employer].  He is married.  He does not smoke or drink.  REVIEW OF SYSTEMS:  Remarkable for inability to eat, belching, pain, and marked constitution symptoms.  PHYSICAL EXAMINATION:  Temperature 97.8 degree, pulse 116, blood pressure 124/88.  WEIGHT:  130 pounds, down 16 pounds from one month ago.  GENERAL APPEARANCE:  The patient is pale and appears ill.  He is vomiting what appears to be spit in the emesis basis.  HEENT:  Anicteric.  Extraocular movements intact.  Oral mucous membranes are markedly dry and pale.  NECK:  Supple.  No lymphadenopathy.  LUNGS:  Clear.  HEART:  Regular rate  and rhythm without murmurs or gallops.  ABDOMEN:  Distended and soft with really not much distention with tenderness in the right lower quadrant.  RECTAL:  Stool was loose and trace positive for blood.  ASSESSMENT:  Nausea, vomiting, dehydration, and abdominal pain, all probably due to the recurrence of Crohns.  I think this gentleman is very likely headed for surgery.  He had Imuran, steroids, and most recently Remicade, with very little ongoing remission.  At this point I think it is unlikely anything else is going to help.  I think that we need to go ahead and get him in the hospital and make sure nothing  more acutely is going on, give him a few days of bowel rest, and IV steroids and then try to make a decision at that time.  PLAN:  Will admit to the hospital.  Give IV steroids.  Keep NPO.  Will obtain an acute abdominal series to rule out a perforation or obstruction and go from there. DD:  10/16/00 TD:  10/16/00 Job: [id number]

## 2011-03-31 NOTE — Consult Note (Signed)
NAME:  [name], [name]                        ACCOUNT NO.:  [id number]   MEDICAL RECORD NO.:  [id number]                   PATIENT TYPE:  EMS   LOCATION:  MINO                                 FACILITY:  [facility]   PHYSICIAN:  Mark C. Vernie Ammons, M.D.               DATE OF BIRTH:  February 16, 1956   DATE OF CONSULTATION:  11/23/2002  DATE OF DISCHARGE:                                   CONSULTATION   HISTORY OF PRESENT ILLNESS:  The patient is a 55 year old black male with a  3 day history of  flank pain intermittent, became severe today. This was  associated with nausea and vomiting but no hematuria. He has no prior  history of stones. He does have a history of Crohn's disease with ileostomy.  He has not had a lot of difficulty with diarrhea recently or trouble from  his Crohn's disease.   PAST MEDICAL HISTORY:  The patient has a history of Crohn's disease. He  underwent surgery in 2001 for that. He had complications with abscess  formation and required the ileostomy and his wound has been healing by  secondary intent.   CURRENT MEDICATIONS:  None.   ALLERGIES:  No known drug allergies.   SOCIAL HISTORY:  No tobacco or ethanol use.   FAMILY HISTORY:  Positive for kidney stones in his mother.   REVIEW OF SYSTEMS:  Negative for constipation, diarrhea or blood in the  stool. He has not seen hematuria. He has no frequency or dysuria. No fever  or chills. No pulmonary or cardiac complaints.   PHYSICAL EXAMINATION:  GENERAL:  The patient is a well developed, well  nourished black male in no apparent distress.  VITAL SIGNS:  Stable per chart.  HEENT:  Normocephalic, atraumatic. Oropharynx clear.  NECK:  Supple.  CHEST:  Clear.  CARDIOVASCULAR:  Regular rate and rhythm.  ABDOMEN:  Protuberant, soft with a healing midline incision with an open  wound that does not appear infected. He has an ileostomy in his right lower  quadrant. No hepatosplenomegaly is palpable.  GENITOURINARY:  He has  normal male external genitalia. No inguinal hernias  or adenopathy. He has a normal glans, meatus, scrotum, testicles,  epididymus, anus and perineum.  RECTAL:  Deferred.  EXTREMITIES:  No cyanosis, clubbing or edema.  NEUROLOGIC:  He is slightly somnolent but oriented, easily arousable and in  minimal discomfort at this time.   LABORATORY DATA:  White count 7.1, hemoglobin 12.7, hematocrit 38.0,  platelets 190,000. Sodium 142, potassium 3.5, chloride 102, CO2 27, BUN 8,  creatinine 1.5, glucose 110. Liver function tests normal.   A CT scan was reviewed and reveals approximately an 8 mm stone in the  proximal right ureter with fairly significant edema surrounding it,  indicating likely longer duration in that location. He has asymptomatic  simple cysts in both kidneys.   IMPRESSION:  Right upper ureteral calculus. It is likely  uric acid as I  cannot see the stone well on plain film. His pain is now controlled. He  actually wanted not only to go home but to go to work tonight. I told him  that he could not go to work but he could be discharged to home with pain  medication. Since his stone is most likely uric acid clinically, I am going  to try to alkalinize his urine to dissolve the stone with the understanding  that he is likely not a candidate for lithotripsy and may require  ureteroscopic extraction if he continues to have pain.   PLAN:  1. Urocit K for dissolution therapy using 10 mEq 2 p.o. q.i.d.  2. Dilaudid 4 mg tablets 1 to 2 q.4h. p.r.n., #40.  3. Toradol 10 mg 1 p.o. q.6h. p.r.n., #36.  4. Phenergan 25 mg tablets 1 p.o. q.6h. p.r.n. nausea and vomiting, #12.  5. He was given my card and will follow up in my office the latter part of     this week, sooner if he has further difficulty.                                                 Mark C. Vernie Ammons, M.D.    MCO/MEDQ  D:  11/23/2002  T:  11/23/2002  Job:  [id number]

## 2011-03-31 NOTE — Op Note (Signed)
[facility]  Patient:    [name], [name]                     MRN: [id number] Proc. Date: 11/04/00 Adm. Date:  [id number] Attending:  Katha Cabal                           Operative Report  PREOPERATIVE DIAGNOSIS:  Abdominal perforation.  POSTOPERATIVE DIAGNOSIS:  Abdominal perforation.  PROCEDURE:  Exploratory laparotomy, resection of ileocolonic anastomosis and creation of ileostomy.  SURGEON:  Ollen Gross. Carolynne Edouard, M.D.  ASSISTANT:  Arvil Persons. Magnus Ivan, M.D.  ANESTHESIA:  General endotracheal.  PROCEDURE:  After informed consent was obtained, the patient was brought to the operating room and placed in a supine position on the operating room table.  After adequate induction of general anesthesia, the patients abdomen was prepped with Betadine and draped in usual sterile manner.  A hemostat was used to remove the staples from his previous incision.  The skin was then opened bluntly and the previous suture closure was opened with scissors. Suture was then removed and the abdomen and the abdominal cavity  was opened. There was an immediate return of a large amount of stool-appearing material. Follow of which was cultured and irrigated.  There was a significant amount of adhesions and interloop abscesses, which were all opened carefully with blunt dissection and finger fracture technique.  The current incision did not allow [place] to see adequately the rest of the abdomen and a small lower midline vertically-oriented incision was connected to this lower transverse incision at the midline.  This was opened under direct vision using the Bovie electrocautery.  This allowed much better visualization of the rest of the abdomen.  A perforation with active leakage of succus was identified several mm proximal to the anastomosis.  A healthy-appearing portion of the small bowel was chosen just proximal to this leaking area and the mesentery was opened adjacent  to the bowel wall using the Bovie electrocautery.  A GIA-75 stapler was then used to divide the small bowel in this place.  The right colon was then mobilized further and a healthy-appearing section of bowel of colon just distal to the anastomosis was identified.  Mesentery adjacent to this bowel wall was opened with the Bovie electrocautery and a TA-90 stapler was initially fired across this bowel.  I was unsatisfied with the firing of the stapler and so just proximal to this staple line, the bowel was then divided again with multiple fires of a GIA-75 stapler.  The mesentery between these two divided pieces of bowel was scored with the Bovie electrocautery and then serially clamped with Kelly clamps, divided with Metzenbaum scissors and ligated with a combination of 2-0 silk ties and 2-0 silk suture ligatures until the specimen was removed from the patient.  The rest of the bowel was then inspected and no other leaking areas could be identified.  The abdomen was then irrigated with copious amounts of saline. An appropriate place on the anterior abdominal wall was then chosen for an ileostomy and this portion of skin was then grasped with a Kocher clamp and a circular piece of skin was removed.  A core of subcutaneous fat was then removed with the Bovie electrocautery until the fascia of the anterior abdominal wall was encountered.  The fascia of the anterior abdominal wall was opened in a cruciate manner until four fingers could be inserted through this  hole.  A Babcock clamp was then inserted through this hole and grasped the staple line of the terminal ileum and the terminal ileum was brought out through this hole.  The abdominal incisions were then closed with four running #1 PDS sutures.  The skin was left open because of the amount of contamination in the abdomen and the wounds were packed with Kerlix.  The wounds were then covered with green towels and the ileostomy was opened along  its staple line and matured with interrupted 3-0 Vicryl sutures.  A ostomy bag was attached and dressings were applied to the rest of the abdomen.  The patient tolerated the procedure well.  At the end of the case, all needle, sponge and instrument counts were correct.  The patient was then taken to the ICU intubated in stable, but critical condition. DD:  11/05/00 TD:  11/05/00 Job: [id number]

## 2011-03-31 NOTE — Discharge Summary (Signed)
[name], [name]              ACCOUNT NO.:  [id number]   MEDICAL RECORD NO.:  [id number]          PATIENT TYPE:  INP   LOCATION:  1509                         FACILITY:  [facility]   PHYSICIAN:  Hettie Holstein, D.O.    DATE OF BIRTH:  [birth year]   DATE OF ADMISSION:  11/11/2005  DATE OF DISCHARGE:                                 DISCHARGE SUMMARY   PRIMARY CARE PHYSICIAN:  Unassigned   UROLOGIST:  Dr. Vernie Holt   PRINCIPAL DIAGNOSES:  1.  Ureterolithiasis and nephrolithiasis with intractable nausea and      vomiting and left flank pain.  2.  Crohn's disease, stable.  3.  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  4.  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  5.  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  6.  Multiple episodes of ureteral calculus followed by Dr. Vernie Holt.   DISCHARGE MEDICATIONS:  Patient was instructed to continue his calcium  citrate as he was prior to admission and increase his fluid intake.  In  addition, he is provided a prescription for Cipro to conclude on January 7.  In addition he will be provided with Phenergan 12.5 mg q.4h. p.r.n. nausea  as well as Vicodin 5/500 mg q.6h. p.r.n. pain.   DISPOSITION:  He is, as noted above, instructed to increase his water intake  and call Dr. Vernie Holt to schedule a follow-up.  He is going to have a CT scan  in one week as he did have CT evidence of a 13 mm non-obstructing stone in  his left kidney.  I discussed this with Dr. Brunilda Payor who felt that this is  unlikely this would pass and he may need intervention in the outpatient  setting.  Currently, he is awaiting lower extremity Doppler studies as he  has complained of calf pain, though he has been on DVT prophylaxis during  his hospital course.  He does have some marked pain.  Will rule this out.  If these are okay he should be able to go home with some pain medications.   HISTORY OF PRESENT ILLNESS:  For full details  please refer to the H&P as  dictated by Dr. Fatima Sanger B. Bakare.  However, briefly, [name] is a  pleasant 55 year old male with significant history of Crohn's disease status  post terminal ileum resection.  He had an ileostomy.  He had a history of  ureteroteric stones with multiple procedures in the past.  He started  feeling unwell three days previously with nausea and vomiting and left flank  pain.  He passed a stone on Thursday.  He continued to pass a stone in the  emergency department, though prior to the CT scan revealing the 13 mm stone.  He was admitted for pain management.   HOSPITAL COURSE:  Initially [name] had an elevated creatinine and  perhaps a combination of dehydration and prerenal status as well as possible  post-obstructive etiology, though the stone had passed and there was no  hydronephrosis noted on imaging studies performed on this admission.  He  continued to pass stones throughout his hospital course, small and granular  and these were sent for analysis though the results are not available at  time of discharge.  He is medically stable and his renal function has  returned to his baseline and he has remained hemodynamically stable,  tolerating his diet well.  He did have episodes of intractable hiccups but  these have resolved.  He is being discharged in medically stable condition  to follow up with his urologist, Dr. Vernie Holt.  I have instructed him to  follow up this week.   FINAL LABORATORY DATA:  Sodium 138, potassium 4.2, BUN 13, creatinine 0.9,  glucose 110.  Stone analysis results are not available at this time and can  be followed up in the outpatient setting.      Hettie Holstein, D.O.  Electronically Signed     ESS/MEDQ  D:  11/14/2005  T:  11/14/2005  Job:  [id number]   cc:   Veverly Fells. Vernie Holt, M.D.  Fax: [phone]

## 2011-03-31 NOTE — H&P (Signed)
[name], [name]              ACCOUNT NO.:  [id number]   MEDICAL RECORD NO.:  [id number]          PATIENT TYPE:  INP   LOCATION:  0102                         FACILITY:  [facility]   PHYSICIAN:  Mobolaji B. Bakare, M.D.DATE OF BIRTH:  1956/08/07   DATE OF ADMISSION:  11/11/2005  DATE OF DISCHARGE:                                HISTORY & PHYSICAL   PRIMARY CARE PHYSICIAN:  Unassigned.   GASTROENTEROLOGIST:  Llana Aliment. Randa Evens, M.D.   UROLOGISTLoraine Leriche C. Vernie Ammons, M.D.   CHIEF COMPLAINT:  Nausea, vomiting and left flank pain for three days.   HISTORY OF PRESENTING COMPLAINT:  [name] is a pleasant 55 year old  African American male with significant history of Crohn's disease, status  post terminal ileum resection.  He has an ileostomy.  He has history of  ureteric stones and multiple procedures in the past.  He started feeling  unwell three days ago with nausea and vomiting and left flank pain.  He did  pass one stool on 16-Apr-2023.  Today he has passed three stools. One was in  the emergency room.  He has also had generalized body cramps involving his  legs and torso.  There has been no fever but he has occasionally expressed  chills.  He has no shortness of breath, sore throat.  He has hiccups.  He  has increased frequency of micturition and strangury.  He noted that his  urine output is somewhat decreased.  No dysuria.   REVIEW OF SYSTEMS:  He has no headaches, no fever.  There has been no  increase in the ileostomy output.  No chest pain,  no shortness of breath.  He has poor p.o. intake and has been vomiting.   PAST MEDICAL HISTORY:  1.  Crohn's disease. He has undergone terminal ileum resection and he has an      ileostomy.  He has had multiple surgeries related to the Crohn's      disease.  2.  Aseptic necrosis of both hips.  He is status post bone graft.  3.  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  4.  History of ureteric stones with hydronephrosis in November  2004.  5.  He has had multiple episodes of ureteral calculus.  This is followed by      Dr. Vernie Ammons.   MEDICATIONS:  Calcium citrate 10 mEq four tablets q.i.d.   ALLERGIES:  No known drug allergies.   FAMILY HISTORY:  Significant for Crohn's disease in his mother who is alive  and she is 66 years old.  Father is alive and no known ailment.  One sister  has throat cancer.  He is married and has three children.   SOCIAL HISTORY:  Does not smoke cigarettes, does not drink alcohol.  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   PHYSICAL EXAMINATION:  VITAL SIGNS:  Blood pressure 110/47, temperature  97.6, pulse 113, now 79.  Respiratory rate 16, oxygen saturation 98% on room  air.  GENERAL:  On examination the patient is not in respiratory distress.  Somewhat  uncomfortable.  HEENT: Normocephalic, atraumatic.  Pupils equal, round, and reactive to  light.  No pale anicteric.  NECK:  No elevated JVD.  No carotid bruits.  LUNGS:  Clear to auscultation.  CARDIOVASCULAR:  S1 and S2 regular.  No murmur, no gallops.  ABDOMEN:  Distended.  Soft..  Tenderness in the left lumbar and left lower  quadrant region.  No rebound.  No guarding.  There is a ileostomy bag in  situ.  Bowel sounds present.  EXTREMITIES:  No pedal edema, no calf tenderness.  Dorsalis pedis pulse 2+  bilaterally.  CNS:  No focal neurological deficit.   LABORATORY DATA:  White cells 12.8, hemoglobin 17.7, hematocrit 53.3,  platelets 325, neutrophils 88, lymphocytes 7, absolute neutrophil count  11.2.  Sodium 137, potassium 4.9, chloride 96, bicarb 19, glucose 134, BUN  33, creatinine 3, calcium 10.4.  Urinalysis:  Cloudy in appearance, specific  gravity 1.025, small leukocytes, protein 30, small bilirubin.  Microscopic:  White cells 3-6, bacteria few. Red blood cells 3-6.   RADIOLOGICAL DATA:  Abdominal x-ray showed positive bowel gas and gas  collection in the right lower abdomen  unusual but nonspecific; probable  bowel loop.  CT scan of the abdomen and pelvis:  Bilateral renal stones  without hydronephrosis.  Mild stranding around the left ureter with punctate  stone at left UVJ.  Small bowel anastomosis accounts for rounded loops seen  on x-ray and is expected non-acute finding.  No free fluid or acute findings  aside from the urine stones.   ASSESSMENT/PLAN:  [name] is a 55 year old African American male with  history of ureteric stones and Crohn's disease.  He is presenting with left  flank pain, nausea, vomiting, passage of stones and elevated BUN and  creatinine.  1.  Ureteric stones/bilateral kidney stones.  We give IV fluid normal saline      at 200 mL per hour, Dilaudid 1-2 mg IV q.4h. p.r.n. for pain.  Continue      calcium citrate, Phenergan 12.5 mg q.4h. p.r.n. nausea and vomiting.      Will treat with antibiotics, Ceclor 40 mg IV q.12h. pending urine      culture.  Will obtain urologic consult in the a.m.  2.  Renal failure.  Most likely secondary to combination of ureteric stones      (no hydronephrosis noted on CT scan) and dehydration.  Will institute IV      fluid as mentioned above. I would expect the solution to be saline.  3.  Hyponatremia.  Most likely secondary to poor p.o. intake.  Will give IV      fluid normal saline as above.  4.  Crohn's disease.  This appears quiescent at this time.      Mobolaji B. Corky Downs, M.D.  Electronically Signed     MBB/MEDQ  D:  11/12/2005  T:  11/12/2005  Job:  [id number]   cc:   Veverly Fells. Vernie Ammons, M.D.  Fax: [phone]   Llana Aliment. Malon Kindle., M.D.  Fax: [phone]

## 2011-03-31 NOTE — H&P (Signed)
[facility]  Patient:    [name], [name]                     MRN: [id number] Adm. Date:  [id number] Attending:  Orland Mustard                         History and Physical  REASON FOR ADMISSION:  Crohns disease, fever and abdominal pain.  HISTORY:  [age]-year-old gentleman, with a long history of Crohns disease, has been having increasing abdominal pain for some weeks.  Back in April of this year, he had active disease by small bowel series.  He has received Remicade two times, in May of this year as well as September 19th. His last infusion was about two weeks ago.  He got better for several days but last week began to get worse again with increasing pain, low-grade fevers, bloating, etc.  Over the weekend, he had Tylenol for a temperature of 102 and bloating, abdominal pain and somewhat loose bowel movements.  His pain has gotten more severe and he has been unable to eat.  He was seen in the office today due to these symptoms.  He notes that he has had to take narcotics for the pain it has been so severe.  This has all gotten worse since this past Thursday.  CURRENT MEDICATIONS 1. Pentasa 1 g q.i.d. 2. Imuran 100 mg q.d. 3. Prednisone 20 mg b.i.d.  ALLERGIES:  He has no drug allergies.  MEDICAL HISTORY 1. Crohns disease, diagnosed by terminal ileal resection done at [facility] in 1983.  He has never had surgery here in [place].  Has seen    Dr. Luisa Hart L. Ballen in the past.  Last small bowel series in April of    this year showed active Crohns in the terminal ileum. 2. Aseptic necrosis of probably both hips.  He is followed by the orthopedic    clinic at [facility].  It is felt to be prednisone related and has received a    bone graft for the left hip, done at [facility]. 3. Mycosis fungoides.  FAMILY HISTORY:  Mother has Crohns.  SOCIAL HISTORY:  Patient works for [employer], is married and does not smoke or drink.  PHYSICAL  EXAMINATION  VITAL SIGNS:  Temperature 99.2, blood pressure 104/76, pulse 128.  GENERAL:  A thin black male who appears ill.  He appears somewhat clammy and pale.  HEENT:  Sclerae nonicteric.  Extraocular movements intact.  Throat:  Mucous membranes dry.  NECK:  Supple.  No lymphadenopathy.  LUNGS:  Clear.  HEART:  Regular rate and rhythm, without murmurs or gallops.  ABDOMEN:  Generally soft, markedly tender in the right lower quadrant, possibly with rebound.  It is slightly distended.  RECTAL:  Rectal reveals stool to be absent, mucus heme-negative.  ASSESSMENT:  Continued abdominal pain and fever in a young man with Crohns who is already on Imuran and steroids and has been recently treated with Remicade.  I am concerned that he may have developed an abscess or a microperforation.  He clearly is worse despite the recent Remicade therapy.  PLAN:  Will admit, give IV Solu-Medrol, empiric antibiotics.  We will obtain a CT of the abdomen and pelvis and make further recommendations depending on the results. DD:  08/13/00 TD:  08/13/00 Job: [id number]

## 2011-03-31 NOTE — Op Note (Signed)
[facility]  Patient:    [name], [name]                     MRN: [id number] Proc. Date: 10/30/00 Adm. Date:  [id number] Attending:  Orland Mustard                           Operative Report  PREOPERATIVE DIAGNOSIS:  Postoperative abdominal bleeding.  POSTOPERATIVE DIAGNOSIS:  Postoperative abdominal bleeding.  OPERATION:  Exploratory laparotomy and control of abdominal bleeding.  SURGEON:  Lorne Skeens. Hoxworth, M.D.  ASSISTANT:  Catalina Lunger, M.D.  ANESTHESIA:  General.  BRIEF HISTORY:  [name] is a 55 year old black male who approximately 6-8 hours prior to this procedure underwent extensive abdominal surgery for recurrent Crohns disease with small bowel and colonic resections.  Initially was stable in recovery room; however, postoperative lab revealed elevated INR of 2.1 and hemoglobin of 6.3.  He was transferred and observed in the ICU and transfused with packed cells and fresh frozen plasma.  Initially improved and remained stable; however, early this morning developed tachycardia in the 160s and hypotension despite both transfusions.  Laparotomy for bleeding has been recommended and accepted by the patient and the family, and he is brought to the operating room for this procedure.  DESCRIPTION OF PROCEDURE:  The patient was brought to the operating room, placed in the supine position on the operating table, and general endotracheal anesthesia was induced.  A large bore internal jugular catheter had been placed.  The abdomen was sterilely prepped and draped.  He was given antibiotics preoperatively.  The staples were removed and the previous suture removed from the transverse right lower abdominal incision.  A large amount of blood and clot were evacuated from the peritoneal cavity.  This totalled approximately two liters.  Dry packings were placed in the four quadrants and careful exploration performed.  The most notable  area of bleeding appeared to be from the small bowel mesentery where there was slow arterial bleeding. This was controlled with several figure-of-eight sutures and pressure and Surgicel.  There was, however, somewhat diffuse oozing also from the right peritoneal gutter and from the omentum.  There was also some moderate bleeding from the stapled end of the small bowel.  All of these areas were controlled with either figure-of-eight sutures of silk and/or cautery, Surgicel, and pressure.  During the procedure, the patient was resuscitated with three units of fresh frozen plasma and three units of packed cells with return of normal vital signs.  The abdomen was observed for a good while with these measures and no significant bleeding was ongoing at the end of the procedure.  The viscera returned to the anatomic position.  The abdominal fascia and musculature was closed with running #1 Novofil beginning at the incision and tied centrally.  The skin was closed with staples.  Sponge, needle and instrument counts were correct.  Dry sterile dressing was applied, and the patient was returned to the ICU intubated in stable condition. DD:  10/30/00 TD:  10/30/00 Job: [id number]

## 2011-03-31 NOTE — Op Note (Signed)
NAME:  [name], [name]                        ACCOUNT NO.:  [id number]   MEDICAL RECORD NO.:  [id number]                   PATIENT TYPE:  AMB   LOCATION:  [facility]                                 FACILITY:  [facility]   PHYSICIAN:  Mark C. Vernie Ammons, M.D.               DATE OF BIRTH:  18-Jan-1956   DATE OF PROCEDURE:  10/07/2003  DATE OF DISCHARGE:                                 OPERATIVE REPORT   PREOPERATIVE DIAGNOSIS:  Bilateral renal calculi with hydronephrosis and  azotemia.   POSTOPERATIVE DIAGNOSIS:  Bilateral renal calculi with hydronephrosis and  azotemia.   OPERATION/PROCEDURE:  1. Cystoscopy.  2. Bilateral retrograde pyelograms with interpretation.  3. Bilateral ureteroscopy, left.  4. Laser in situ lithotripsy.  5. Bilateral double-J stent placements.   SURGEON:  Mark C. Vernie Ammons, M.D.   ANESTHESIA:  General.   ESTIMATED BLOOD LOSS:  Less than 10 mL.   DRAINS:  6-French, 26 cm double-J stents in both ureters.   SPECIMENS:  None.   COMPLICATIONS:  None.   INDICATIONS:  The patient is a 55 year old black male with Crohn's disease  who developed some uric acid stones.  He had a known stone in his kidney  that was not causing any difficulty.  He came in the other day with flank  pain and groin pain.  He was found on CT scan to have bilateral upper  ureteral calculi.  His creatinine had elevated at that time but his  potassium and recheck of that today revealed further elevation of his  creatinine but normal potassium.  He is brought to the OR for cystoscopy and  double-J stent placement with attempted laser of the stones.  The risks,  complications and alternatives were discussed.   DESCRIPTION OF PROCEDURE:  After informed consent, the patient was brought  to the major operating room, placed on the table, administration of general  anesthesia, then moved to the dorsal lithotomy position. His genitalia was  sterilely prepped and draped and initially the 6-French  ureteroscope was  introduced per urethra which was noted to be normal including the prostatic  urethra.  The bladder had no tumor, stones, or inflammatory lesions seen.  The left orifice was identified.  A guide wire was passed partially up the  left ureter and the ureteroscope removed after which a ureteral access  sheath was passed over the guide wire to dilate the distal ureter.  I then  left the guide wire in place and reinserted the rigid 6-French ureteroscope  and passed this up the ureter next to the guide wire.  I was able to  visualize the stone, photograph and engage it in the nitinol basket.  I then  pulled it down to about the level of the crossing of the iliac vessels but  could not extract it further safely.  I, therefore, left it in the basket  and used the holmium laser to fragment the stone  in situ.  After complete  fragmentation of the stone, I then removed the nitinol basket and the  ureteroscope.  I backloaded the cystoscope over the guide wire and passed  the double-J stent into the renal pelvis, removing the guide wire with good  curl being noted in the renal pelvis and bladder.   With the ureteral access sheath in the left ureter, I did perform retrograde  pyelogram and I noted significant hydronephrosis proximal to the stone which  was seen as a filling defect.  Identical procedures were performed on the  right hand side.  Again with the access sheath I injected contrast through  this, noted hydronephrosis up to about the level of the UPJ or just distal.  I did not see a filling defect there.  I, therefore, inserted the flexible  ureteroscope over the guide wire, passed it up the ureter under direct  visualization and noted no stone.  There was a rough, irritated area with  some mild edema at the location that I had seen the change in caliber of the  ureter, just distal to the UPJ indicating whether the stone had previously  been located.  I fully inspected the  kidney but could not locate any large  stones for laser fragmentation, but having seen some small stones, I felt  dissolution therapy was likely the best treatment.  I, therefore, left the  guide wire in place and inserted the double-J stent in an identical fashion  the contralateral side, then drained the bladder, and removed the  cystoscope.  Two percent lidocaine jelly was inserted in the urethra.  The  patient was awakened and taken to the recovery room in stable and  satisfactory condition.  He tolerated the procedure well.  There were no  intraoperative complications.   He will be given a prescription for 24 Vicoprofin, 28 Pyridium Plus and he  will be started on allopurinol 300 mg a day.  When he returns to my office,  I will check a creatinine.  As long as that is normal, I will begin him on  Uroset K, but I do not feel additional potassium load would be appropriate  at this time with elevated creatinine.                                               Mark C. Vernie Ammons, M.D.    MCO/MEDQ  D:  10/07/2003  T:  10/07/2003  Job:  [id number]

## 2011-03-31 NOTE — Op Note (Signed)
NAMESEBASTIAN, [name]              ACCOUNT NO.:  [id number]   MEDICAL RECORD NO.:  [id number]          PATIENT TYPE:  AMB   LOCATION:  [facility]                         FACILITY:  [facility]   PHYSICIAN:  Mark C. Vernie Ammons, M.D.  DATE OF BIRTH:  [birth year]   DATE OF PROCEDURE:  04/24/2005  DATE OF DISCHARGE:                                 OPERATIVE REPORT   PREOPERATIVE DIAGNOSIS:  Right ureteral calculus.   POSTOPERATIVE DIAGNOSES:  1.  Right ureteral calculus.  2.  Right renal calculi.   PROCEDURES:  Cystoscopy, right retrograde pyelogram with interpretation,  right ureteroscopy with laser lithotripsy and stent placement.   SURGEON:  Mark C. Vernie Ammons, M.D.   ANESTHESIA:  General.   DRAINS:  A 6-French, 24-cm, Double-J stent in the right ureter (no string).   SPECIMENS:  Larina Bras will be sent for analysis.   BLOOD LOSS:  Minimal.   COMPLICATIONS:  None.   INDICATIONS:  The patient is a 55 year old black male with a history of uric  acid stones. He was found to have a right ureteral calculus with some  hydronephrosis proximally, and I attempted lithotripsy of the stone, but was  unable to visualize it. He has placed on empiric Urocit-K in the hopes that  it will assist in dissolution of the stone. He is brought to the OR today  for treatment of the obstructing right ureteral calculus. The risks,  complications, and alternatives have been discussed.  The patient  understands and elected to proceed.   DESCRIPTION OF OPERATION:  After informed consent, the patient was brought  to main OR, placed on the operating table, administered  general anesthesia,  then moved to the dorsal lithotomy position. His genitalia was sterilely  prepped and draped, and a 6-French rigid ureteroscope was then passed per  urethra into the bladder. The bladder and the urethra was noted be normal.  The prostatic urethra was without lesions or obstruction, and the bladder  itself had no tumor, stones, or  inflammatory lesions.   The right orifice was identified, and the ureteroscope was then passed up  the right orifice under direct visualization, and the stone was visualized.   A right retrograde pyelogram was then performed by injecting contrast  material through the ureteroscope. It had been outlined the stone;  however,  the injection of the contrast caused the stone to migrate back up into the  kidney. The remainder of the collecting system appeared normal other than  smooth round filling defects noted that appeared to be the renal calculi  seen on CT scan.   A 0.038-inch floppy-tip guide wire was passed through the ureteroscope and  into the renal pelvis. I then removed the rigid ureteroscope and passed the  6-French flexible ureteroscope over the guide wire into the renal pelvis and  removed the guide wire. I was able to identify the stone which had migrated  into the upper pole. I grasped it with a Nitinol basket and brought it down  into the ureter. I could not get the stone all the way out the ureter, so I  left it engaged  in the basket, backed the flexible ureteroscope off the  basket, and passed the rigid 6-French rigid ureteroscope up next to the  basket and then used the Holmium laser to fragment the stone completely. I  then was able to remove the basket and re-inserted the guide wire and the  flexible scope for a second pass. Upon passing the flexible scope into the  kidney, the guide wire was again removed, and a second stone was identified.  It was grasped with a Nitinol basket, pulled into the ureter, and treated  with laser lithotripsy in an identical fashion to the previous stone. I then  re-inserted the guide wire and removed the ureteroscope and back-loaded the  cystoscope over the guide wire. The Double-J stent was then passed over the  guide wire into the renal pelvis, and then the guide wire was removed with  good curl being noted in the area of the renal pelvis  and the bladder. I  then drained the bladder and removed the cystoscope. Small stone fragments  were obtained for analysis. and the patient was awakened and taken to the  recovery room in stable satisfactory condition. He tolerated the procedure  well with no intraoperative complications.   He will be given a prescription for 28 Pyridium of 200 mg and 36 Vicodin HP.  He will then return to my office in two weeks for followup. In the meantime,  the stone will be sent for analysis.       MCO/MEDQ  D:  04/24/2005  T:  04/24/2005  Job:  [id number]

## 2011-03-31 NOTE — Discharge Summary (Signed)
[place]  Patient:    [name], [name]                     MRN: [id number] Adm. Date:  [id number] Disc. Date: [id number] Attending:  Orland Mustard CC:         Th[place]. Daphine Deutscher, M.D.                           Discharge Summary  HOSPITAL COURSE:  The patient was admitted to the subacute care unit on February 7, for increases in nutrition and ambulation following extended hospital stay at [facility].  He was admitted December 4, with intractable Crohns disease and underwent at least three operations with first operation of small bowel, the second the same evening due to intraperitoneal bleeding and the third several days later following fall with rupture of the small bowel.  The course was complicated with wound dehiscence and prolonged bed rest and the patient had been on IV fluids and TNA.  It was felt that he would improve subacute care therapy prior to his discharge and he was transferred over to the subacute care unit.  For more details of the original hospitalization, please see the discharge summary from that hospitalization.  The patient was admitted to the subacute care unit.  He was continued to see the wound care nurse for his wound dehiscence.  He was on the V.A.C. for a period of time with wet-to-dry dressings and this type of thing.  He was followed by a nutritionist and was on TNA and this was continued.  PPD was placed and this was negative.  He continued to have some abdominal pain, but was gradually advanced up to full liquids.  His pain gradually improved and his diet was advanced.  He was ambulating with physical therapy and he was up and around able to get around the room.  Calorie count showed that the patient was doing reasonably well with about 75% or more of his needs and with his p.o. intake.  TNA was subsequently stopped.  He continued to have some pain requiring pain medications and with ambulation he  continued to improve. Ostomy care nurse discontinued the V.A.C. and continued wet-to-dry dressings and his wound gradually improved.  It was felt after consultation with Dr. Daphine Deutscher that he was in satisfactory condition for discharge.  DISCHARGE DIAGNOSES: 1. Crohns disease, status post resection of terminal ileum with subsequent    laparotomies with repair of perforated bowel and ileostomy. 2. Wound dehiscence with extended wound care required with wound apparently    healing well at this point and time. 3. Abdominal pain, probably due to adhesions and postoperative pain as well as    pain from his dehiscent abdominal wound.  DISCHARGE MEDICATIONS: 1. Prednisone 2.5 mg three tablets daily for a total of 7.5 mg daily. 2. Pepcid 20 mg b.i.d. 3. Ensure plus one can three times a day between meals. 4. Percocet two tablets every four to six hours as needed for pain. 5. Phenergan p.r.n. for nausea.  DIET:  Low residue diet.  ACTIVITY:  The patient will be up in the room as needed.  Advanced home health care nurse will visit initially to help with the dressing of his wound.  This will be managed in the future by Dr. Daphine Deutscher.  FOLLOWUP:  Follow up with Dr. Daphine Deutscher in two weeks and with Dr. Randa Evens in one month.  We will call to make those arrangements. DD:  01/16/01 TD:  01/17/01 Job: [id number]

## 2011-03-31 NOTE — Op Note (Signed)
NAME:  [name], [name]                        ACCOUNT NO.:  [id number]   MEDICAL RECORD NO.:  [id number]                   PATIENT TYPE:  AMB   LOCATION:  [facility]                                 FACILITY:  [facility]   PHYSICIAN:  Mark C. Vernie Ammons, M.D.               DATE OF BIRTH:  03/25/1956   DATE OF PROCEDURE:  12/01/2002  DATE OF DISCHARGE:                                 OPERATIVE REPORT   PREOPERATIVE DIAGNOSIS:  Right ureteral calculus.   POSTOPERATIVE DIAGNOSIS:  Right ureteral calculus.   PROCEDURE:  Cystoscopy.  Right retrograde pyelogram with interpretation,  right ureteroscopy with stone extraction and double-J stent placement.   SURGEON:  Mark C. Vernie Ammons, M.D.   ANESTHESIA:  General.   SPECIMENS:  Stone given to the patient.   ESTIMATED BLOOD LOSS:  Less than 1 cubic centimeters.   DRAINS:  4.5 French, 26 cm double-J stent in the right ureter with string.   COMPLICATIONS:  None.   INDICATIONS:  The patient is a 55 year old black male with Crohn's disease  who had acute onset ureteral colic and found to have an 8 mm stone in the  right upper ureter.  The stone could not be visualized on plane film and was  found to be uric acid and was started on a dissolution therapy but continued  to have pain and hydronephrosis seen on ultrasound.  I therefore recommended  ureteroscopic extraction of the stone.  The risks, complications and  alternatives were fully discussed and are noted in my office notes which are  in place in the chart.   DESCRIPTION OF PROCEDURE:  After informed consent, the patient was brought  to the OR, placed on the table and administered general anesthesia and then  moved into the dorsal lithotomy position.  The genitalia were sterilely  prepped and draped and a 6 French rigid ureteroscope was then passed and the  urethra was noted to be entirely normal.  The sphincter was intact.  The  prostatic urethra had no lesions.  The right orifice was identified  and  noted to be very small.  I therefore passed a 0.03 floppy tip guidewire  through the ureteroscope and up the right ureter under fluoroscopic control.  I then removed the ureteroscope and passed an open ended ureteral catheter  over the guidewire and removed the guidewire.  The right retrograde  pyelogram was then performed and revealed no filling defect within the  ureter.  My feeling was that the stent had likely pushed the stone up into  the kidney.  I did not feel that there had been enough time for the stone to  completely dissolve.  The guidewire was reinserted through the opened ended  catheter and passed into the region of the renal pelvis.  I then passed a  ureteral access sheath over the guidewire and removed the inner cannula and  guidewire.  The flexible cystoscope was  then passed through the access  sheath and into the area of the renal pelvis, and all calyces were  inspected.  There was a single stone fell within a middle pole calyx and I  grasped it with the nitinol basket and was able to pull it into the open end  of the access sheath and then remove the ureteroscope and the sheath in toto  removing the stone.   I reinserted the rigid ureteroscope and passed it up the ureter part way and  then passed the guidewire through that and then removed the ureteroscope and  back loaded the cystoscope.  Next, the ureteral stent was then passed up the  ureter under fluoroscopic guidance and the guidewire removed.  The curl was  noted in the renal pelvis and bladder.  I then drained the bladder after  inspecting it, noting no tumor, stones or inflammatory lesions.  The patient  received a B&O suppository and the string was affixed to the dorsum of the  penis.  He was then awakened and taken to the recovery room in stable and  satisfactory condition.  He tolerated the procedure well with no  intraoperative complications.   DISPOSITION:  He will be given a prescription for 30  Pyridium Plus and 36  Vicodin.  He is to follow up in my office in six days for stent removal.                                               Mark C. Vernie Ammons, M.D.    MCO/MEDQ  D:  12/01/2002  T:  12/01/2002  Job:  [id number]   cc:   Fayrene Fearing L. Malon Kindle., M.D.  [address], [address]  [place]  [place] [address]  Fax: [phone]

## 2011-03-31 NOTE — Discharge Summary (Signed)
[facility]  Patient:    [name], [name]                     MRN: [id number] Adm. Date:  [id number] Disc. Date: 12/19/00 Attending:  Katha Cabal CC:         Llana Aliment. Randa Evens, M.D.                           Discharge Summary  ADMISSION DIAGNOSES:  Nausea, vomiting, abdominal pain, dehydration - manifestations of chronic Crohns disease.  BRIEF HISTORY:  [name] is a 55 year old male with Crohns disease diagnosed in 57.  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  It was in fact Crohns disease and he has been treated and managed by Dr. Randa Evens accordingly.  MEDICATIONS:  [name] has been on Remicade on multiple occasions in 2001 for more aggressive treatment of his Crohns.  In addition he takes Pentasa four tabs q.i.d., prednisone 20 mg b.i.d., Imuran 50 mg b.i.d., daily Resource and Ultram for pain.  ALLERGIES:  He denied any drug allergies.  PAST MEDICAL HISTORY:  Remarkable also in that he had mycosis fungoides followed by Dr. Corky Downs at [facility].  HOSPITAL COURSE:  [name] was admitted by Dr. Randa Evens and he was given nutrition enterally with a liquid diet.  Because it was unclear whether he had an area of partial obstruction that has caused his recurrent cramps and possibly at the anastomosis, Dr. Daphine Deutscher was called to see the patient first on October 20, 2000.  I did retrieve his operative note from [facility] and studied that.  He underwent a colonoscopy looking at stenosis.  We realized he was at high risk because of his chronic Crohns for having many adhesions possibly and we went ahead and made arrangements for surgery on December 17.  Informed consent was obtained and he seemed to be very aware of the risks and benefits of surgery, and he  was taken for a laparotomy with distal small bowel resection and ascending colon resection and takedown of fistula with enteroenterostomy done on October 29, 2000.  This surgery was complicated by oozing and bleeding and he required re-exploration on December 18 at 4 a.m. by Dr. Johna Sheriff and no obvious source of bleeding was seen, being basically evacuated then of some blood and closed him.  The patient was extubated.  He had some problems with thrombocytopenia for which he was seen by Dr. Myna Hidalgo, and this seemed to resolve.  At that point, we realized he was fairly significantly immunosuppressed.  He was getting along much better and he had another setback on December 23 after he had fallen trying to get up. He began having abdominal pain and was found to have developed a leak somewhere in his terminal ileum.  This required laparotomy by Dr. Carolynne Edouard on December 23 and ileostomy.  He had irrigation and debridement.  He was maintained on his high-dose Solu-Medrol and was on nutritional support. Despite that, he developed a wound infection with factual dehiscence and this was complicating his care at that point.  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  He has had adherent bowel to the base of the wound.  The fistula has  closed on TNA.  Subsequently a Vac dressing was placed over the wound and he developed good beefy red tissue and he has been taking TNA and then subsequently he has been started on some clear liquids which he seems to tolerate.  The fistula seems to have stopped.  He has had two CT scans, the first showing peri right colon fluid collection, which has resolved as recently as December 15, 2000.  He has been getting up in a chair and regaining his strength.  He is in the process of being prepared for transfer to [facility] to the [facility].  Anticipated date of discharge is December 19, 2000.  The patient has been  maintained on TNA.  For full details of his MAR, which is not available in this chart, will have to be attached separately from nursing printout.  FINAL DIAGNOSES:  Crohns disease and mycosis fungoides.  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 DD:  12/18/00 TD:  12/18/00 Job: [id number]

## 2011-03-31 NOTE — Op Note (Signed)
[facility]  Patient:    [name], [name]                     MRN: [id number] Proc. Date: 10/29/00 Adm. Date:  [id number] Attending:  Orland Mustard CC:         Llana Aliment. Randa Evens, M.D.  Rolly Pancake. Corky Downs, M.D., [facility]   Operative Report  PREOPERATIVE DIAGNOSIS:  Crohns disease with anastomotic stricture.  POSTOPERATIVE DIAGNOSIS:  Crohns disease with anastomotic inflammatory mass consistent with Crohns and enterofistula.  OPERATION:  Exploratory laparotomy with enterolysis, resection of inflammatory mass in right lower quadrant, and take-down of fistula with enteroenterostomy and resection with the small bowel to ascending colon anastomosis.  SURGEON:  Thornton Park. Daphine Deutscher, M.D.  ASSISTANT:  Rose Phi. Maple Hudson, M.D.  ANESTHESIA:  General endotracheal.  DESCRIPTION OF PROCEDURE:  Mr. Wetherington was taken to OR #1 at 5 p.m., October 29, 2000, and given general anesthesia.  He received a bolus of steroids and 2 grams of Cefotan preoperatively.  The abdomen was prepped widely with Betadine and perineum was prepped, and a Foley catheter was inserted, and the patient was draped sterilely.  I excised his old scar in the right lower quadrant and entered the abdomen without difficulty.  He had a marked number of adhesions upon entering which I took down with sharp dissection and was able to mobilize the small intestine proximally up toward the ligament of Treitz, and distally I found a loop of bowel that was stuck down into the this very dense segment of bowel.  I began taking that down and found this to be a fistula from more proximal ileum into the inflammatory mass, and I divided and got into the fistula and took it down.  I subsequently resected the area of granulation tissue in that segment of bowel and performed an enteroenterostomy using the GIA and closing the common defect with a TA60.  This enabled me to preserve about 2 feet of  bowel distal to this before resectioning the bowel at that point and dividing with the GIA.  This area was markedly dilated and was in the area immediately adjacent to the obstruction.  Once divided proximally, I then went distally and after mobilizing this, I divided it in the ascending colon.  The mesentery was markedly thickened, and I went through that with the [facility] clamps with some difficulty, taking little bites and oversewing with figure-of-eight sutures of 2-0 silk.  The patient has a propensity to ooze quite a bit, but we were able to control this with just continuing to put in numerous 2-0 silk sutures.  Once resected, irrigated, and we changed our gloves since I had been looking at this, and sent it to Dr. Guilford Shi who found I very marked inflammatory mass consistent with Crohns.  I then fashioned a functional end-to-end anastomosis by laying the antimesenteric border of the small bowel, which had been stapled off distally, along the teniae of the ascending colon.  These were sutured together with a back row through the teniae with 3-0 silk.  The bowel was opened and decompressed under controlled circumstances.  Proximally the small bowel had a bowel clamp on it, but I went ahead and sutured a back row of 3-0 silk, then opened it, and sutured an inner layer of running 4-0 PDS carried anteriorly in a Connell-Mayo fashion to complete the first portion of the two layer anastomosis.  The outer layer was then completed using  3-0 silk limbered sutures.  I then irrigated copiously, changed gloves, and maintained fresh instruments. I then began a careful look throughout the abdomen.  There were some bleeders that I uncovered in the omentum which were oversewn with figure-of-eight sutures of 2-0 silk and on the proximal anastomosis.  The area was inspected again, and no active bleeding could be seen, and the abdomen was then closed with a single layer of running #1 Prolene.  The wound  was irrigated with saline, and the skin was closed with the stapler.  The patient tolerated the procedure well and was taken to recovery room in satisfactory condition.  FINAL DIAGNOSIS:  Apparent Crohns involving the terminal ileum with multiple fistulae and obstruction. DD:  10/29/00 TD:  10/30/00 Job: [id number]

## 2011-03-31 NOTE — Discharge Summary (Signed)
[facility]  Patient:    [name], [name]                     MRN: [id number] Adm. Date:  [id number] Disc. Date: [id number] Attending:  Orland Mustard                           Discharge Summary  ADMITTING DIAGNOSES: 1. Abdominal pain and fever in a gentleman with Crohns disease.  FINAL DIAGNOSES: 1. Active Crohns disease improved with conservative therapy. 2. Mycosis oncoides. 3. 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  PERTINENT HISTORY:  A 55 year old gentleman with a long history of Crohns has been on chronic steroids intermittently and has received Remicade 2 times. It began to get worse with some increasing pain, low grade fever, bloating, etc. His pain got so severe he was taking narcotics. He was seen in the office for these symptoms.  PHYSICAL EXAMINATION:  VITAL SIGNS:  Temperature 99.2, blood pressure 104/76, pulse 128.  GENERAL:  A thin white male who appeared clammy and pale.  HEENT:  Normal.  LUNGS:  Clear.  ABDOMEN:  Soft with marked tenderness in the right lower quadrant and was distended.  RECTAL:  Stool was absent. Mucus was heme negative. For more details, please see the dictated admission history and physical.  HOSPITAL COURSE:  The patient was admitted to the medical floor and placed on IV fluids and kept n.p.o. other than clear small amounts of clear liquids. He received IV Cefotan and IV Solu-Medrol. Lab work was obtained revealing a white count of 5.6, sed rate of 55, unremarkable liver function tests and normal urinalysis. CT scan of the abdomen and pelvis was obtained to rule out an abscess. There were markedly abnormal loops of small bowel, thick walled and dilated with stranding consistent with active Crohns disease. There was no obvious obstruction. The patient was still tender in the right lower quadrant but after 24 hours of bowel rest felt better. He was due for  another dose of Remicade and we went ahead during this hospitalization and gave him 350 mg of Remicade over 2 hours. He tolerated this well. The next morning, he was feeling better and was tolerating clear liquids and overall was feeling much improved. We switched everything over to p.o. and continued him in clear liquids. He was able to tolerate his medicines and clear liquids without problems and it was felt that he could continue this therapy at home.  DISPOSITION:   The patient is discharged home on October 4 on a clear liquid diet with a Resource 1 can 4-6 times daily. In addition to this, he will take eggs, potatoes and rice.  DISCHARGE MEDICATIONS:  Prednisone 20 mg b.i.d., Imuran 50 mg b.i.d., Pentasa 1 gm q.i.d., Ultram 50 mg q. 4h p.r.n. and Resource. He will see Dr. Randa Evens in the office in 2 weeks. DD:  09/25/00 TD:  09/25/00 Job: [id number]

## 2012-01-29 ENCOUNTER — Other Ambulatory Visit: Payer: Self-pay | Admitting: Urology

## 2012-02-05 ENCOUNTER — Encounter (HOSPITAL_BASED_OUTPATIENT_CLINIC_OR_DEPARTMENT_OTHER): Payer: Self-pay | Admitting: *Deleted

## 2012-02-05 NOTE — Progress Notes (Signed)
NPO AFTER MN. ARRIVES AT 0930. NEEDS HG AND EKG.

## 2012-02-10 NOTE — Discharge Instructions (Signed)

## 2012-02-10 NOTE — H&P (Signed)
istory of Present Illness  Nephrolithiasis: He has known bilateral renal calculi and had a history of passing stones spontaneously on a frequent basis. 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 He was therefore placed on potassium citrate. A CT scan done in 7/11 revealed persistent bilateral nephrolithiasis.   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   Interval history: At his last visit he was found to have bilateral nephrolithiasis as well as a large ladder calculus. Dissolution therapy was initiated with potassium citrate since he has a known history of uric acid calculi. He tolerated the medication but still is having some voiding symptoms. No hematuria.    Past Medical History Problems  1. History of  Crohn's Disease 555.9 2. History of  Skin Cancer V10.83  Surgical History Problems  1. History of  Lithotripsy  Current Meds 1. Cialis 20 MG Oral Tablet; TAKE 1 TABLET As Directed; Therapy: 15Jul2011 to (Last  Rx:07Feb2013)  Requested for: 07Feb2013 2. Hydrocodone-Acetaminophen 10-325 MG Oral Tablet; TAKE 1-2 TABLETS EVERY 6 HOURS AS  NEEDED; Therapy: 07Feb2013 to (Evaluate:19Feb2013); Last Rx:07Feb2013 3. Ondansetron 8 MG Oral Tablet Dispersible; TAKE 1 TABLET Every 4 hours PRN nausea or  vomiting; Therapy: 07Feb2013 to (Last Rx:07Feb2013)  Requested for: 07Feb2013 4. Potassium Citrate ER 10 MEQ (1080 MG) Oral Tablet Extended Release; TAKE 4 TABLETS BY  MOUTH 4 TIMES DAILY; Therapy: 04Sep2009 to (Evaluate:09Mar2013)  Requested for:  07Feb2013; Last Rx:07Feb2013  Allergies Medication  1. No Known Drug Allergies  Family History Problems  1. Maternal history of  Chronic Renal Failure 2. Family history of  Family Health Status Number Of Children 2 sons; 1 daughter 3. Maternal history of  Urologic Disorder V18.7 kidney stones  Social History Problems  1. Caffeine Use tea  daily 2. Marital History - Currently Married 3. Occupation: Dance movement psychotherapist 4. Tobacco Use V15.82 1 pk a wk for 7 yrs; quit 15 yrs ago Denied  5. Alcohol Use  Review of Systems Genitourinary, constitutional, skin, eye, otolaryngeal, hematologic/lymphatic, cardiovascular, pulmonary, endocrine, musculoskeletal, gastrointestinal, neurological and psychiatric system(s) were reviewed and pertinent findings if present are noted.  Genitourinary: urinary frequency, feelings of urinary urgency and erectile dysfunction, but no hematuria.  Gastrointestinal: nausea and abdominal pain.    Vitals Vital Signs BMI Calculated: 28.93 BSA Calculated: 1.92 Height: 5 ft 6 in Weight: 180 lb  Blood Pressure: 121 / 79 Heart Rate: 106  Review of Systems: Pertinent items are noted in HPI. A comprehensive review of systems was negative except as above   Physical Exam: General appearance: alert and appears stated age Head: Normocephalic, without obvious abnormality, atraumatic Eyes: conjunctivae/corneas clear. EOM's intact.  Oropharynx: moist mucous membranes Neck: supple, symmetrical, trachea midline Resp: normal respiratory effort Cardio: regular rate and rhythm Back: symmetric, no curvature. ROM normal. No CVA tenderness. GI: soft, non-tender; bowel sounds normal; no masses,  no organomegaly Male genitalia: penis: normal male phallus with no lesions or discharge. Testes: bilaterally descended with no masses or tenderness. no hernias Extremities: extremities normal, atraumatic, no cyanosis or edema Skin: Skin color normal. No visible rashes or lesions Neurologic: Grossly normal  Assessment Assessed  1. Bladder Calculus 594.1   It appears that dissolution therapy was unsuccessful. Because of that I have recommended cystolitholapaxy. I've gone over the procedure with him in detail including its risks and complications. He understands and has elected to proceed.   Plan    He will  be  scheduled for cystolitholapaxy.

## 2012-02-12 ENCOUNTER — Ambulatory Visit (HOSPITAL_BASED_OUTPATIENT_CLINIC_OR_DEPARTMENT_OTHER)
Admission: RE | Admit: 2012-02-12 | Discharge: 2012-02-12 | Disposition: A | Discharge status: Home / Self Care | Payer: 59 | Source: Ambulatory Visit | Attending: Urology | Admitting: Urology

## 2012-02-12 ENCOUNTER — Encounter (HOSPITAL_BASED_OUTPATIENT_CLINIC_OR_DEPARTMENT_OTHER): Payer: Self-pay | Admitting: Anesthesiology

## 2012-02-12 ENCOUNTER — Ambulatory Visit (HOSPITAL_BASED_OUTPATIENT_CLINIC_OR_DEPARTMENT_OTHER): Payer: 59 | Admitting: Anesthesiology

## 2012-02-12 ENCOUNTER — Encounter (HOSPITAL_BASED_OUTPATIENT_CLINIC_OR_DEPARTMENT_OTHER): Payer: Self-pay | Admitting: *Deleted

## 2012-02-12 ENCOUNTER — Other Ambulatory Visit: Payer: Self-pay

## 2012-02-12 ENCOUNTER — Encounter (HOSPITAL_BASED_OUTPATIENT_CLINIC_OR_DEPARTMENT_OTHER)
Admission: RE | Disposition: A | Discharge status: Home / Self Care | Payer: Self-pay | Source: Ambulatory Visit | Attending: Urology

## 2012-02-12 DIAGNOSIS — K5090 Crohn's disease, unspecified, without complications: Secondary | ICD-10-CM | POA: Insufficient documentation

## 2012-02-12 DIAGNOSIS — N210 Calculus in bladder: Secondary | ICD-10-CM | POA: Insufficient documentation

## 2012-02-12 DIAGNOSIS — Z79899 Other long term (current) drug therapy: Secondary | ICD-10-CM | POA: Insufficient documentation

## 2012-02-12 DIAGNOSIS — Z932 Ileostomy status: Secondary | ICD-10-CM | POA: Insufficient documentation

## 2012-02-12 DIAGNOSIS — Z85828 Personal history of other malignant neoplasm of skin: Secondary | ICD-10-CM | POA: Insufficient documentation

## 2012-02-12 DIAGNOSIS — E860 Dehydration: Secondary | ICD-10-CM | POA: Insufficient documentation

## 2012-02-12 HISTORY — DX: Personal history of urinary calculi: Z87.442

## 2012-02-12 HISTORY — DX: Crohn's disease, unspecified, without complications: K50.90

## 2012-02-12 HISTORY — DX: Ileostomy status: Z93.2

## 2012-02-12 HISTORY — DX: Calculus in bladder: N21.0

## 2012-02-12 HISTORY — DX: Personal history of other diseases of the musculoskeletal system and connective tissue: Z87.39

## 2012-02-12 HISTORY — PX: CYSTOSCOPY WITH LITHOLAPAXY: SHX1425

## 2012-02-12 HISTORY — DX: Personal history of other malignant neoplasms of lymphoid, hematopoietic and related tissues: Z85.79

## 2012-02-12 LAB — POCT HEMOGLOBIN-HEMACUE: Hemoglobin: 15.7 g/dL (ref 13.0–17.0)

## 2012-02-12 SURGERY — CYSTOSCOPY, WITH BLADDER CALCULUS LITHOLAPAXY
Anesthesia: General | Site: Bladder | Wound class: Clean Contaminated

## 2012-02-12 MED ORDER — CIPROFLOXACIN IN D5W 200 MG/100ML IV SOLN
200.0000 mg | INTRAVENOUS | Status: AC
  Administered 2012-02-12: 200 mg via INTRAVENOUS

## 2012-02-12 MED ORDER — FENTANYL CITRATE 0.05 MG/ML IJ SOLN: 25.0000 ug | INTRAMUSCULAR | Status: DC | PRN

## 2012-02-12 MED ORDER — MIDAZOLAM HCL 5 MG/5ML IJ SOLN
INTRAMUSCULAR | Status: DC | PRN
  Administered 2012-02-12: 2 mg via INTRAVENOUS

## 2012-02-12 MED ORDER — ONDANSETRON HCL 4 MG/2ML IJ SOLN
INTRAMUSCULAR | Status: DC | PRN
  Administered 2012-02-12: 4 mg via INTRAVENOUS

## 2012-02-12 MED ORDER — PHENAZOPYRIDINE HCL 200 MG PO TABS: 200.0000 mg | ORAL_TABLET | Freq: Three times a day (TID) | ORAL | Status: AC | PRN

## 2012-02-12 MED ORDER — HYDROCODONE-ACETAMINOPHEN 5-325 MG PO TABS
1.0000 | ORAL_TABLET | ORAL | Status: DC | PRN
  Administered 2012-02-12: 1 via ORAL

## 2012-02-12 MED ORDER — HYDROCODONE-ACETAMINOPHEN 10-300 MG PO TABS: 1.0000 | ORAL_TABLET | Freq: Four times a day (QID) | ORAL | Status: DC | PRN

## 2012-02-12 MED ORDER — DEXAMETHASONE SODIUM PHOSPHATE 4 MG/ML IJ SOLN
INTRAMUSCULAR | Status: DC | PRN
  Administered 2012-02-12: 4 mg via INTRAVENOUS

## 2012-02-12 MED ORDER — PHENAZOPYRIDINE HCL 200 MG PO TABS
200.0000 mg | ORAL_TABLET | Freq: Once | ORAL | Status: AC
  Administered 2012-02-12: 200 mg via ORAL

## 2012-02-12 MED ORDER — FENTANYL CITRATE 0.05 MG/ML IJ SOLN
INTRAMUSCULAR | Status: DC | PRN
  Administered 2012-02-12 (×2): 25 ug via INTRAVENOUS
  Administered 2012-02-12: 50 ug via INTRAVENOUS
  Administered 2012-02-12 (×2): 25 ug via INTRAVENOUS
  Administered 2012-02-12: 50 ug via INTRAVENOUS

## 2012-02-12 MED ORDER — PROMETHAZINE HCL 25 MG/ML IJ SOLN: 6.2500 mg | INTRAMUSCULAR | Status: DC | PRN

## 2012-02-12 MED ORDER — LACTATED RINGERS IV SOLN
INTRAVENOUS | Status: DC | PRN
  Administered 2012-02-12: 10:00:00 via INTRAVENOUS

## 2012-02-12 MED ORDER — SODIUM CHLORIDE 0.9 % IR SOLN
Status: DC | PRN
  Administered 2012-02-12: 6000 mL

## 2012-02-12 MED ORDER — PROPOFOL 10 MG/ML IV EMUL
INTRAVENOUS | Status: DC | PRN
  Administered 2012-02-12: 80 mg via INTRAVENOUS

## 2012-02-12 MED ORDER — LACTATED RINGERS IV SOLN
INTRAVENOUS | Status: DC
  Administered 2012-02-12: 12:00:00 via INTRAVENOUS
  Administered 2012-02-12: 100 mL/h via INTRAVENOUS

## 2012-02-12 SURGICAL SUPPLY — 39 items
ADAPTER CATH URET PLST 4-6FR (CATHETERS) IMPLANT
ADPR CATH URET STRL DISP 4-6FR (CATHETERS)
BAG DRAIN URO-CYSTO SKYTR STRL (DRAIN) ×2 IMPLANT
BAG DRN UROCATH (DRAIN) ×1
BASKET LASER NITINOL 1.9FR (BASKET) IMPLANT
BASKET STNLS GEMINI 4WIRE 3FR (BASKET) IMPLANT
BASKET ZERO TIP NITINOL 2.4FR (BASKET) IMPLANT
BRUSH URET BIOPSY 3F (UROLOGICAL SUPPLIES) IMPLANT
BSKT STON RTRVL 120 1.9FR (BASKET)
BSKT STON RTRVL GEM 120X11 3FR (BASKET)
BSKT STON RTRVL ZERO TP 2.4FR (BASKET)
CANISTER SUCT LVC 12 LTR MEDI- (MISCELLANEOUS) IMPLANT
CATH INTERMIT  6FR 70CM (CATHETERS) IMPLANT
CATH URET 5FR 28IN CONE TIP (BALLOONS)
CATH URET 5FR 70CM CONE TIP (BALLOONS) IMPLANT
CLOTH BEACON ORANGE TIMEOUT ST (SAFETY) ×2 IMPLANT
DRAPE CAMERA CLOSED 9X96 (DRAPES) ×2 IMPLANT
ELECT REM PT RETURN 9FT ADLT (ELECTROSURGICAL)
ELECTRODE REM PT RTRN 9FT ADLT (ELECTROSURGICAL) IMPLANT
EVACUATOR MICROVAS BLADDER (UROLOGICAL SUPPLIES) ×1 IMPLANT
GLOVE BIO SURGEON STRL SZ8 (GLOVE) ×2 IMPLANT
GOWN PREVENTION PLUS LG XLONG (DISPOSABLE) ×2 IMPLANT
GOWN STRL REIN XL XLG (GOWN DISPOSABLE) ×2 IMPLANT
GOWN XL W/COTTON TOWEL STD (GOWNS) ×2 IMPLANT
GUIDEWIRE 0.038 PTFE COATED (WIRE) ×2 IMPLANT
GUIDEWIRE ANG ZIPWIRE 038X150 (WIRE) IMPLANT
GUIDEWIRE STR DUAL SENSOR (WIRE) ×2 IMPLANT
IV NS IRRIG 3000ML ARTHROMATIC (IV SOLUTION) ×4 IMPLANT
KIT BALLIN UROMAX 15FX10 (LABEL) IMPLANT
KIT BALLN UROMAX 15FX4 (MISCELLANEOUS) IMPLANT
KIT BALLN UROMAX 26 75X4 (MISCELLANEOUS)
LASER FIBER DISP (UROLOGICAL SUPPLIES) IMPLANT
LASER FIBER DISP 1000U (UROLOGICAL SUPPLIES) ×2 IMPLANT
NS IRRIG 500ML POUR BTL (IV SOLUTION) IMPLANT
PACK CYSTOSCOPY (CUSTOM PROCEDURE TRAY) ×2 IMPLANT
SET HIGH PRES BAL DIL (LABEL)
SHEATH URET ACCESS 12FR/35CM (UROLOGICAL SUPPLIES) IMPLANT
SHEATH URET ACCESS 12FR/55CM (UROLOGICAL SUPPLIES) IMPLANT
WATER STERILE IRR 3000ML UROMA (IV SOLUTION) IMPLANT

## 2012-02-12 NOTE — Anesthesia Postprocedure Evaluation (Signed)
  Anesthesia Post-op Note  Patient: [name]  Procedure(s) Performed: Procedure(s) (LRB): CYSTOSCOPY WITH LITHOLAPAXY (N/A) HOLMIUM LASER APPLICATION (N/A)  Patient Location: PACU  Anesthesia Type: General  Level of Consciousness: awake and alert   Airway and Oxygen Therapy: Patient Spontanous Breathing  Post-op Pain: mild  Post-op Assessment: Post-op Vital signs reviewed, Patient's Cardiovascular Status Stable, Respiratory Function Stable, Patent Airway and No signs of Nausea or vomiting  Post-op Vital Signs: stable  Complications: No apparent anesthesia complications

## 2012-02-12 NOTE — Anesthesia Procedure Notes (Signed)
Procedure Name: LMA Insertion Date/Time: 02/12/2012 10:43 AM Performed by: Jessica Priest Pre-anesthesia Checklist: Patient identified, Emergency Drugs available, Suction available and Patient being monitored Patient Re-evaluated:Patient Re-evaluated prior to inductionOxygen Delivery Method: Circle System Utilized Preoxygenation: Pre-oxygenation with 100% oxygen Intubation Type: IV induction Ventilation: Mask ventilation without difficulty LMA: LMA inserted LMA Size: 4.0 Number of attempts: 1 Airway Equipment and Method: bite block Placement Confirmation: positive ETCO2 Tube secured with: Tape Dental Injury: Teeth and Oropharynx as per pre-operative assessment

## 2012-02-12 NOTE — Interval H&P Note (Signed)
History and Physical Interval Note:  02/12/2012 10:16 AM  [name]  has presented today for surgery, with the diagnosis of Bladder Stone  The various methods of treatment have been discussed with the patient and family. After consideration of risks, benefits and other options for treatment, the patient has consented to  Procedure(s) (LRB): CYSTOSCOPY WITH LITHOLAPAXY (N/A) HOLMIUM LASER APPLICATION (N/A) as a surgical intervention .  The patients' history has been reviewed, patient examined, no change in status, stable for surgery.  I have reviewed the patients' chart and labs.  Questions were answered to the patient's satisfaction.     Garnett Farm

## 2012-02-12 NOTE — Anesthesia Preprocedure Evaluation (Addendum)
Anesthesia Evaluation  Patient identified by MRN, date of birth, ID band Patient awake    Reviewed: Allergy & Precautions, H&P , NPO status , Patient's Chart, lab work & pertinent test results  Airway Mallampati: II TM Distance: >3 FB Neck ROM: Full    Dental No notable dental hx.    Pulmonary former smoker breath sounds clear to auscultation  Pulmonary exam normal       Cardiovascular Rhythm:Regular Rate:Normal  ECG: NSR, LAD   Neuro/Psych negative neurological ROS  negative psych ROS   GI/Hepatic Neg liver ROS, H/o crohn's disease, s/p resection. Denies significant GERD   Endo/Other  negative endocrine ROS  Renal/GU negative Renal ROS  negative genitourinary   Musculoskeletal negative musculoskeletal ROS (+)   Abdominal   Peds negative pediatric ROS (+)  Hematology negative hematology ROS (+)   Anesthesia Other Findings   Reproductive/Obstetrics negative OB ROS                          Anesthesia Physical Anesthesia Plan  ASA: II  Anesthesia Plan: General   Post-op Pain Management:    Induction: Intravenous  Airway Management Planned: LMA  Additional Equipment:   Intra-op Plan:   Post-operative Plan: Extubation in OR  Informed Consent: I have reviewed the patients History and Physical, chart, labs and discussed the procedure including the risks, benefits and alternatives for the proposed anesthesia with the patient or authorized representative who has indicated his/her understanding and acceptance.   Dental advisory given  Plan Discussed with: CRNA  Anesthesia Plan Comments:         Anesthesia Quick Evaluation

## 2012-02-12 NOTE — Transfer of Care (Signed)
Immediate Anesthesia Transfer of Care Note  Patient: [name]  Procedure(s) Performed: Procedure(s) (LRB): CYSTOSCOPY WITH LITHOLAPAXY (N/A) HOLMIUM LASER APPLICATION (N/A)  Patient Location: PACU  Anesthesia Type: General  Level of Consciousness: awake, sedated, patient cooperative and responds to stimulation  Airway & Oxygen Therapy: Patient Spontanous Breathing and Patient connected to face mask oxygen  Post-op Assessment: Report given to PACU RN, Post -op Vital signs reviewed and stable and Patient moving all extremities  Post vital signs: Reviewed and stable  Complications: No apparent anesthesia complications

## 2012-02-12 NOTE — Op Note (Signed)
PATIENT:  [name]  PRE-OPERATIVE DIAGNOSIS: Bladder stone  POST-OPERATIVE DIAGNOSIS: Same  PROCEDURE:  Procedure(s): Cystolitholapaxy (2.6 cm stone)   SURGEON:  Surgeon(s): Garnett Farm  ANESTHESIA:   General  EBL:  Minimal  DRAINS: None  SPECIMEN:  Stone given the patient   Indication: [name] is a 56 year old male patient with a history of nephrolithiasis. He also has relative, chronic dehydration secondary to ileostomy for Crohn's disease. His results and hypocitraturia. He was recently found to have a large bladder stone which I tried to dissolve with urine alkalinization but was unsuccessful and therefore he is brought to the operating room for cystolitholapaxy.  Description of operation: The patient was taken to the operating room and administered general anesthesia. He was then placed on the table and moved to the dorsal lithotomy position after which his genitalia was sterilely prepped and draped. An official timeout was then performed.  The 22 French rigid cystoscope was then passed under direct vision down the urethra using the 12 lens. The urethra was noted be entirely normal. The prostatic urethra revealed no significant lateral lobe hypertrophy or obstructive changes. The bladder was entered and noted to have 1+ trabeculation. A single large oval stone measuring approximately 2.6 cm was seen on the floor of the bladder. Ureteral orifices had some edema because of the presence of the stone but were in normal position and appeared to be of normal configuration.  The 1000  holmium laser fiber was then passed through the cystoscope and into the bladder. I used this to fragment the stone. I then tried using the lithotrite to fragment the stone further but the stone was too hard for this device. I therefore continued use the holmium laser until the stone was fully fragmented and intermittently I removed fragments using the Microvasive evacuator until all stone  fragments had been removed from the bladder. The bladder was then reinspected and noted to be intact with no evidence of perforation or injury. I therefore drained the bladder and remove the cystoscope. The patient tolerated the procedure well with no intraoperative complications. He was taken to the recovery room in stable and satisfactory condition.  PLAN OF CARE: Discharge to home after PACU  PATIENT DISPOSITION:  PACU - hemodynamically stable.

## 2012-02-19 ENCOUNTER — Encounter (HOSPITAL_BASED_OUTPATIENT_CLINIC_OR_DEPARTMENT_OTHER): Payer: Self-pay | Admitting: Urology

## 2014-08-24 ENCOUNTER — Emergency Department (HOSPITAL_COMMUNITY)
Admission: EM | Admit: 2014-08-24 | Discharge: 2014-08-24 | Disposition: A | Discharge status: Home / Self Care | Payer: 59 | Attending: Emergency Medicine | Admitting: Emergency Medicine

## 2014-08-24 ENCOUNTER — Encounter (HOSPITAL_COMMUNITY): Payer: Self-pay | Admitting: Emergency Medicine

## 2014-08-24 ENCOUNTER — Emergency Department (HOSPITAL_COMMUNITY): Payer: 59

## 2014-08-24 DIAGNOSIS — Z8589 Personal history of malignant neoplasm of other organs and systems: Secondary | ICD-10-CM | POA: Diagnosis not present

## 2014-08-24 DIAGNOSIS — Y9389 Activity, other specified: Secondary | ICD-10-CM | POA: Diagnosis not present

## 2014-08-24 DIAGNOSIS — Z79899 Other long term (current) drug therapy: Secondary | ICD-10-CM | POA: Diagnosis not present

## 2014-08-24 DIAGNOSIS — S82842A Displaced bimalleolar fracture of left lower leg, initial encounter for closed fracture: Secondary | ICD-10-CM

## 2014-08-24 DIAGNOSIS — S99912A Unspecified injury of left ankle, initial encounter: Secondary | ICD-10-CM | POA: Diagnosis present

## 2014-08-24 DIAGNOSIS — W1789XA Other fall from one level to another, initial encounter: Secondary | ICD-10-CM | POA: Insufficient documentation

## 2014-08-24 DIAGNOSIS — Z8719 Personal history of other diseases of the digestive system: Secondary | ICD-10-CM | POA: Insufficient documentation

## 2014-08-24 DIAGNOSIS — Z87442 Personal history of urinary calculi: Secondary | ICD-10-CM | POA: Diagnosis not present

## 2014-08-24 DIAGNOSIS — W19XXXA Unspecified fall, initial encounter: Secondary | ICD-10-CM

## 2014-08-24 DIAGNOSIS — M25572 Pain in left ankle and joints of left foot: Secondary | ICD-10-CM

## 2014-08-24 DIAGNOSIS — Y9289 Other specified places as the place of occurrence of the external cause: Secondary | ICD-10-CM | POA: Insufficient documentation

## 2014-08-24 MED ORDER — NAPROXEN 500 MG PO TABS: 500.0000 mg | ORAL_TABLET | Freq: Two times a day (BID) | ORAL | Status: DC | PRN

## 2014-08-24 MED ORDER — MORPHINE SULFATE 4 MG/ML IJ SOLN
4.0000 mg | Freq: Once | INTRAMUSCULAR | Status: AC
  Administered 2014-08-24: 4 mg via INTRAMUSCULAR
  Filled 2014-08-24: qty 1

## 2014-08-24 MED ORDER — HYDROMORPHONE HCL 1 MG/ML IJ SOLN
1.0000 mg | Freq: Once | INTRAMUSCULAR | Status: AC
  Administered 2014-08-24: 1 mg via INTRAMUSCULAR
  Filled 2014-08-24: qty 1

## 2014-08-24 MED ORDER — OXYCODONE-ACETAMINOPHEN 5-325 MG PO TABS: 1.0000 | ORAL_TABLET | Freq: Four times a day (QID) | ORAL | Status: DC | PRN

## 2014-08-24 NOTE — ED Provider Notes (Signed)
CSN: [id number]     Arrival date & time 08/24/14  1632 History  This chart was scribed for [employer], PA, working with Babette Relic, MD found by Starleen Arms, ED Scribe. This patient was seen in room WTR7/WTR7 and the patient's care was started at 7:05 PM.   Chief Complaint  Patient presents with  . Ankle Pain   Patient is a 58 y.o. male presenting with ankle pain. The history is provided by the patient. No language interpreter was used.  Ankle Pain Location:  Ankle Time since incident:  4 hours Injury: yes   Mechanism of injury: fall   Mechanism of injury comment:  Approx 5-83feet Fall:    Fall occurred: off a 5-19ft wall.   Height of fall:  Approx 5-11ft   Impact surface:  Grass   Point of impact:  Feet   Entrapped after fall: no   Ankle location:  L ankle Pain details:    Quality:  Aching and throbbing   Radiates to:  Does not radiate   Severity:  Severe   Onset quality:  Sudden   Duration:  4 hours   Timing:  Constant   Progression:  Worsening Chronicity:  New Dislocation: no   Foreign body present:  No foreign bodies Prior injury to area:  No Relieved by:  Nothing Worsened by:  Nothing tried (did not try to ambulate) Ineffective treatments:  Ice Associated symptoms: decreased ROM and swelling   Associated symptoms: no back pain, no muscle weakness, no neck pain, no numbness and no tingling    HPI Comments: [name] is a 58 y.o. male with a PMHx of b/l AVN and nephrolithiasis, who presents to the Emergency Department complaining of a left ankle injury sustained earlier today after a mechanical fall from a 6 foot wall while cutting grass.  He reports he fell backwards and believes he left leg made direct straight down contact of the ground without a twisting motion or ankle twisting injury.  Patient reports the pain is located in both sides of his ankle, constant, nonradiating, 9/10, throbbing and aching, unrelieved with ice, without any known aggravating  or alleviating factors since he has not tried anything PTA.  Patient denies trying to walk on the ankle but doesn't believe he could.  Patient denies any head trauma or LOC but states he felt stunned for approximately 1 minute after the fall.   Patient reports his last meal was 7 hours ago.  Patient denies history of HTN or cardiac issues.  Patient denies HA, LOC, syncope, CP, SOB, back pain, knee pain, numbness, weakness, bowel/bladder incontinence, cauda equina symptoms, tingling, or myalgias. Able to still feel and wiggle all digits. States swelling occurred immediately and has not worsened any.   Past Medical History  Diagnosis Date  . Crohn's disease   . History of aseptic necrosis of bone BILATERAL HIPS    S/P BONE GRAFT  . History of kidney stones   . Bladder stone   . S/P ileostomy   . History of mycosis fungoides    Past Surgical History  Procedure Laterality Date  . Right ureteroscopic stone extraciton  04-24-2005  & 12-01-2002  . Cysto/ bilateral retrograde pyelogram/ left ureteral stone extraction / bilateral stent placement  10-07-2003  . Exploratory laparotomy/ resection of ileocolonic anastomosis and creation of ileostomy  11-04-2000    ABD. PERFORATION  . Expl. lap. w/ enterolysis, resection inflammatory mass rlq / take-down of fistula with enteroenterostomy/ resection with the small  bowel to ascending colon anastomosis  10-29-2000    CROHN'S DISEASE W/ ANASTOMOTIC INFLAMMATORY MASS/    10-30-2000 EXPL. LAP. CONTROL POST-OP ABD. BLEEDING  . Bone graft of left hip  1986    ASEPTIC NECROSIS  . Resection of terminal ileum  1983  . Cystoscopy with litholapaxy  02/12/2012    Procedure: CYSTOSCOPY WITH LITHOLAPAXY;  Surgeon: Claybon Jabs, MD;  Location: [facility];  Service: Urology;  Laterality: N/A;   History reviewed. No pertinent family history. History  Substance Use Topics  . Smoking status: Not on file  . Smokeless tobacco: Not on file  . Alcohol Use:      Review of Systems  HENT: Negative for facial swelling.   Respiratory: Negative for shortness of breath.   Cardiovascular: Negative for chest pain.  Gastrointestinal: Negative for nausea, vomiting and abdominal pain.  Genitourinary:       Denies bowel/bladder incontinence  Musculoskeletal: Positive for arthralgias (L ankle), gait problem (unable to bear weight in L ankle) and joint swelling (L ankle). Negative for back pain, myalgias and neck pain.  Skin: Negative for color change and wound.  Neurological: Negative for dizziness, syncope, weakness, light-headedness, numbness and headaches.  Psychiatric/Behavioral: Negative for confusion.   10 Systems reviewed and all are negative for acute change except as noted in the HPI.   Allergies  Review of patient's allergies indicates no known allergies.  Home Medications   Prior to Admission medications   Medication Sig Start Date End Date Taking? Authorizing Provider  Hydrocodone-Acetaminophen (VICODIN HP) 10-300 MG TABS Take 1-2 tablets by mouth every 6 (six) hours as needed. 02/12/12   Claybon Jabs, MD  potassium citrate (UROCIT-K) 10 MEQ (1080 MG) SR tablet Take 40 mEq by mouth 4 (four) times daily.     Historical Provider, MD   BP 114/70  Pulse 87  Temp(Src) 98 F (36.7 C) (Oral)  Resp 16  SpO2 94% Physical Exam  Nursing note and vitals reviewed. Constitutional: He is oriented to person, place, and time. Vital signs are normal. He appears well-developed and well-nourished. He appears distressed (in pain).  VSS, appears to be in pain  HENT:  Head: Normocephalic and atraumatic.  Mouth/Throat: Mucous membranes are normal.  [place]/AT, no scalp tenderness or deformity  Eyes: Conjunctivae and EOM are normal. Right eye exhibits no discharge. Left eye exhibits no discharge.  Neck: Normal range of motion. Neck supple. No spinous process tenderness and no muscular tenderness present. No rigidity. Normal range of motion present.  FROM  intact without spinous process or paraspinous muscle TTP, no bony stepoffs or deformities, no muscle spasms. No rigidity or meningeal signs. No bruising or swelling.  Cardiovascular: Normal rate and intact distal pulses.   Pulses:      Dorsalis pedis pulses are 2+ on the right side, and 2+ on the left side.  DP pulses intact bilaterally, unable to assess PT pulse in L ankle due to pain and swelling, cap refill brisk and present in all digits  Pulmonary/Chest: Effort normal. No respiratory distress.  Abdominal: Normal appearance. He exhibits no distension.  Musculoskeletal:       Left ankle: He exhibits decreased range of motion and swelling. He exhibits no deformity and normal pulse. Tenderness. Lateral malleolus and medial malleolus tenderness found. Achilles tendon normal.  L ankle with limited ROM due to pain, strength limited due to pain, bimalleolar TTP with swelling noted to the ankle. Calf soft and nontender, compartments soft. DP pulses equal  bilaterally. Brisk cap refill in all digits. Wiggles all digits well. Sensation grossly intact. Unable to ambulate on L ankle. All spinal levels without TTP. Hips and knees without TTP or deformity.  Neurological: He is alert and oriented to person, place, and time. No sensory deficit. Gait abnormal.  Unable to ambulate on L ankle. Strength of L ankle limited secondary to pain. Sensation grossly intact  Skin: Skin is warm, dry and intact. No abrasion and no rash noted.  No wounds or abrasions  Psychiatric: He has a normal mood and affect. His behavior is normal.    ED Course  Procedures (including critical care time)  DIAGNOSTIC STUDIES: Oxygen Saturation is 94% on RA, adequate by my interpretation.    COORDINATION OF CARE:  7:11 PM Informed patient that imaging was positive for fracture.  Advised patient that his injury will be splinted.  Will consult with orthopaedic surgery. Will order pain medication.  Patient acknowledges and agrees with  plan.    Labs Review Labs Reviewed - No data to display  Imaging Review Dg Ankle Complete Left  08/24/2014   CLINICAL DATA:  [place] off a estimated 5 foot wall today. Left ankle pain and swelling.  EXAM: LEFT ANKLE COMPLETE - 3+ VIEW  COMPARISON:  None.  FINDINGS: There is a mildly displaced fracture of the medial malleolus. There is a fracture of the lateral malleolus at the level of the metaphysis which is nondisplaced. There is a prior osteotomy of the mid shaft fibula. Ankle mortise appears intact. Calcaneus is normal.  IMPRESSION: 1. Displaced fracture of the medial malleolus. 2. Nondisplaced fracture of the lateral malleolus. 3. Remote osteotomy of the midshaft fibula. 4. Ankle mortise intact.   Electronically Signed   By: Suzy Bouchard M.D.   On: 08/24/2014 17:21     EKG Interpretation None      MDM   Final diagnoses:  Bimalleolar ankle fracture, left, closed, initial encounter  Fall, initial encounter  Ankle pain, left    57y/o male with ankle pain after fall, xray obtained and reveals bimalleolar ankle fx, mildly displaced, neurovascularly intact with soft compartments. Ortho consulted since pt had bimalleolar fx and was mildly displaced, Dr. Tamera Punt aware of pt and will see him on Wednesday. Splinted and given crutches for all weight bearing activites, advised pt to monitor for s/sx of compartment syndrome or swelling causing neurovascular damage. Pain improved after morphine and dilaudid, will send home with pain meds. RICE therapy discussed. No lacerations or abrasions, therefore no need for tetanus or abx. I explained the diagnosis and have given explicit precautions to return to the ER including for any other new or worsening symptoms. The patient understands and accepts the medical plan as it's been dictated and I have answered their questions. Discharge instructions concerning home care and prescriptions have been given. The patient is STABLE and is discharged to home in  good condition.   I personally performed the services described in this documentation, which was scribed in my presence. The recorded information has been reviewed and is accurate.  BP 120/74  Pulse 92  Temp(Src) 98 F (36.7 C) (Oral)  Resp 17  SpO2 100%  Meds ordered this encounter  Medications  . morphine 4 MG/ML injection 4 mg    Sig:   . HYDROmorphone (DILAUDID) injection 1 mg    Sig:   . oxyCODONE-acetaminophen (PERCOCET) 5-325 MG per tablet    Sig: Take 1 tablet by mouth every 6 (six) hours as needed for severe  pain.    Dispense:  15 tablet    Refill:  0    Order Specific Question:  Supervising Provider    Answer:  Noemi Chapel D [[id number]]  . naproxen (NAPROSYN) 500 MG tablet    Sig: Take 1 tablet (500 mg total) by mouth 2 (two) times daily as needed for mild pain, moderate pain or headache (TAKE WITH MEALS.).    Dispense:  20 tablet    Refill:  0    Order Specific Question:  Supervising Provider    Answer:  Johnna Acosta [address] [place], PA-C 08/24/14 2042

## 2014-08-24 NOTE — ED Notes (Signed)
Ortho called 

## 2014-08-24 NOTE — Discharge Instructions (Signed)
Wear ankle splint at all times until you see the orthopedist. Use crutches at all times for all weight bearing activities until you see the orthopedist. Ice and elevate ankle throughout the day. Alternate between naprosyn and percocet for pain relief. Do not drive or operate machinery with pain medication use. Call orthopedist tomorrow to schedule followup appointment for Wednesday. Return to the ER for changes or worsening symptoms. Watch for signs of compartment syndrome which include numbness/tingling in your leg that don't go away after you've loosened the ace wrap on your splint.   Ankle Fracture A fracture is a break in a bone. A cast or splint may be used to protect the ankle and heal the break. Sometimes, surgery is needed. HOME CARE  Use crutches as told by your doctor. It is very important that you use your crutches correctly.  Do not put weight or pressure on the injured ankle until told by your doctor.  Keep your ankle raised (elevated) when sitting or lying down.  Apply ice to the ankle:  Put ice in a plastic bag.  Place a towel between your cast and the bag.  Leave the ice on for 20 minutes, 2-3 times a day.  If you have a plaster or fiberglass cast:  Do not try to scratch under the cast with any objects.  Check the skin around the cast every day. You may put lotion on red or sore areas.  Keep your cast dry and clean.  If you have a plaster splint:  Wear the splint as told by your doctor.  You can loosen the elastic around the splint if your toes get numb, tingle, or turn cold or blue.  Do not put pressure on any part of your cast or splint. It may break. Rest your plaster splint or cast only on a pillow the first 24 hours until it is fully hardened.  Cover your cast or splint with a plastic bag during showers.  Do not lower your cast or splint into water.  Take medicine as told by your doctor.  Do not drive until your doctor says it is safe.  Follow-up with  your doctor as told. It is very important that you go to your follow-up visits. GET HELP IF: The swelling and discomfort gets worse.  GET HELP RIGHT AWAY IF:   Your splint or cast breaks.  You continue to have very bad pain.  You have new pain or swelling after your splint or cast was put on.  Your skin or toes below the injured ankle:  Turn blue or gray.  Feel cold, numb, or you cannot feel them.  There is a bad smell or yellowish white fluid (pus) coming from under the splint or cast. MAKE SURE YOU:   Understand these instructions.  Will watch your condition.  Will get help right away if you are not doing well or get worse. Document Released: 08/27/2009 Document Revised: 08/20/2013 Document Reviewed: 05/29/2013 [facility] Patient Information 2015 [place], [place]. This information is not intended to replace advice given to you by your health care provider. Make sure you discuss any questions you have with your health care provider.  Cast or Splint Care Casts and splints support injured limbs and keep bones from moving while they heal. It is important to care for your cast or splint at home.  HOME CARE INSTRUCTIONS  Keep the cast or splint uncovered during the drying period. It can take 24 to 48 hours to dry if it is  made of plaster. A fiberglass cast will dry in less than 1 hour.  Do not rest the cast on anything harder than a pillow for the first 24 hours.  Do not put weight on your injured limb or apply pressure to the cast until your health care provider gives you permission.  Keep the cast or splint dry. Wet casts or splints can lose their shape and may not support the limb as well. A wet cast that has lost its shape can also create harmful pressure on your skin when it dries. Also, wet skin can become infected.  Cover the cast or splint with a plastic bag when bathing or when out in the rain or snow. If the cast is on the trunk of the body, take sponge baths until the  cast is removed.  If your cast does become wet, dry it with a towel or a blow dryer on the cool setting only.  Keep your cast or splint clean. Soiled casts may be wiped with a moistened cloth.  Do not place any hard or soft foreign objects under your cast or splint, such as cotton, toilet paper, lotion, or powder.  Do not try to scratch the skin under the cast with any object. The object could get stuck inside the cast. Also, scratching could lead to an infection. If itching is a problem, use a blow dryer on a cool setting to relieve discomfort.  Do not trim or cut your cast or remove padding from inside of it.  Exercise all joints next to the injury that are not immobilized by the cast or splint. For example, if you have a long leg cast, exercise the hip joint and toes. If you have an arm cast or splint, exercise the shoulder, elbow, thumb, and fingers.  Elevate your injured arm or leg on 1 or 2 pillows for the first 1 to 3 days to decrease swelling and pain.It is best if you can comfortably elevate your cast so it is higher than your heart. SEEK MEDICAL CARE IF:   Your cast or splint cracks.  Your cast or splint is too tight or too loose.  You have unbearable itching inside the cast.  Your cast becomes wet or develops a soft spot or area.  You have a bad smell coming from inside your cast.  You get an object stuck under your cast.  Your skin around the cast becomes red or raw.  You have new pain or worsening pain after the cast has been applied. SEEK IMMEDIATE MEDICAL CARE IF:   You have fluid leaking through the cast.  You are unable to move your fingers or toes.  You have discolored (blue or white), cool, painful, or very swollen fingers or toes beyond the cast.  You have tingling or numbness around the injured area.  You have severe pain or pressure under the cast.  You have any difficulty with your breathing or have shortness of breath.  You have chest  pain. Document Released: 10/27/2000 Document Revised: 08/20/2013 Document Reviewed: 05/08/2013 [facility] Patient Information 2015 [place], [place]. This information is not intended to replace advice given to you by your health care provider. Make sure you discuss any questions you have with your health care provider.  Cryotherapy Cryotherapy is when you put ice on your injury. Ice helps lessen pain and puffiness (swelling) after an injury. Ice works the best when you start using it in the first 24 to 48 hours after an injury. HOME CARE  Put a dry or damp towel between the ice pack and your skin.  You may press gently on the ice pack.  Leave the ice on for no more than 10 to 20 minutes at a time.  Check your skin after 5 minutes to make sure your skin is okay.  Rest at least 20 minutes between ice pack uses.  Stop using ice when your skin loses feeling (numbness).  Do not use ice on someone who cannot tell you when it hurts. This includes small children and people with memory problems (dementia). GET HELP RIGHT AWAY IF:  You have white spots on your skin.  Your skin turns blue or pale.  Your skin feels waxy or hard.  Your puffiness gets worse. MAKE SURE YOU:   Understand these instructions.  Will watch your condition.  Will get help right away if you are not doing well or get worse. Document Released: 04/17/2008 Document Revised: 01/22/2012 Document Reviewed: 06/22/2011 [facility] Patient Information 2015 [place], [place]. This information is not intended to replace advice given to you by your health care provider. Make sure you discuss any questions you have with your health care provider.

## 2014-08-24 NOTE — ED Notes (Signed)
Pt reports he fell off a estimated 5 foot wall, injured left ankle. Pain 9/10.

## 2014-08-24 NOTE — ED Notes (Addendum)
AVS explained in detail. Knows not to drink/drive/operate heavy machinery with medications. Splint in place, educated on proper use of crutches. Knows to follow up ASAP with orthopedics. No other questions/concerns. Work note given.

## 2014-08-26 ENCOUNTER — Encounter (HOSPITAL_COMMUNITY): Payer: Self-pay | Admitting: *Deleted

## 2014-08-26 ENCOUNTER — Other Ambulatory Visit: Payer: Self-pay | Admitting: Orthopedic Surgery

## 2014-08-26 ENCOUNTER — Encounter (HOSPITAL_COMMUNITY): Payer: Self-pay | Admitting: Pharmacy Technician

## 2014-08-26 NOTE — Progress Notes (Signed)
Pre-op orders not signed, called Dr. Bettina Gavia office and requested orders be signed. Spoke with [place].

## 2014-08-27 ENCOUNTER — Encounter (HOSPITAL_COMMUNITY): Payer: 59 | Admitting: Certified Registered Nurse Anesthetist

## 2014-08-27 ENCOUNTER — Ambulatory Visit (HOSPITAL_COMMUNITY)
Admission: RE | Admit: 2014-08-27 | Discharge: 2014-08-27 | Disposition: A | Discharge status: Home / Self Care | Payer: 59 | Source: Ambulatory Visit | Attending: Orthopedic Surgery | Admitting: Orthopedic Surgery

## 2014-08-27 ENCOUNTER — Encounter (HOSPITAL_COMMUNITY): Payer: Self-pay | Admitting: Certified Registered Nurse Anesthetist

## 2014-08-27 ENCOUNTER — Ambulatory Visit (HOSPITAL_COMMUNITY): Payer: 59

## 2014-08-27 ENCOUNTER — Encounter (HOSPITAL_COMMUNITY)
Admission: RE | Disposition: A | Discharge status: Home / Self Care | Payer: Self-pay | Source: Ambulatory Visit | Attending: Orthopedic Surgery

## 2014-08-27 ENCOUNTER — Ambulatory Visit (HOSPITAL_COMMUNITY): Payer: 59 | Admitting: Certified Registered Nurse Anesthetist

## 2014-08-27 DIAGNOSIS — X58XXXA Exposure to other specified factors, initial encounter: Secondary | ICD-10-CM | POA: Insufficient documentation

## 2014-08-27 DIAGNOSIS — K5090 Crohn's disease, unspecified, without complications: Secondary | ICD-10-CM | POA: Insufficient documentation

## 2014-08-27 DIAGNOSIS — Z87442 Personal history of urinary calculi: Secondary | ICD-10-CM | POA: Insufficient documentation

## 2014-08-27 DIAGNOSIS — Y929 Unspecified place or not applicable: Secondary | ICD-10-CM | POA: Diagnosis not present

## 2014-08-27 DIAGNOSIS — Z8572 Personal history of non-Hodgkin lymphomas: Secondary | ICD-10-CM | POA: Insufficient documentation

## 2014-08-27 DIAGNOSIS — Z87891 Personal history of nicotine dependence: Secondary | ICD-10-CM | POA: Diagnosis not present

## 2014-08-27 DIAGNOSIS — S82842A Displaced bimalleolar fracture of left lower leg, initial encounter for closed fracture: Secondary | ICD-10-CM | POA: Diagnosis present

## 2014-08-27 DIAGNOSIS — Z9889 Other specified postprocedural states: Secondary | ICD-10-CM

## 2014-08-27 DIAGNOSIS — Z8781 Personal history of (healed) traumatic fracture: Secondary | ICD-10-CM

## 2014-08-27 HISTORY — DX: Malignant (primary) neoplasm, unspecified: C80.1

## 2014-08-27 HISTORY — PX: ORIF ANKLE FRACTURE: SHX5408

## 2014-08-27 LAB — BASIC METABOLIC PANEL
Anion gap: 14 (ref 5–15)
BUN: 16 mg/dL (ref 6–23)
CO2: 18 mEq/L — ABNORMAL LOW (ref 19–32)
Calcium: 8.5 mg/dL (ref 8.4–10.5)
Chloride: 105 mEq/L (ref 96–112)
Creatinine, Ser: 1.1 mg/dL (ref 0.50–1.35)
GFR calc non Af Amer: 72 mL/min — ABNORMAL LOW (ref >90)
GFR, EST AFRICAN AMERICAN: 83 mL/min — AB (ref >90)
GLUCOSE: 148 mg/dL — AB (ref 70–99)
POTASSIUM: 3.9 meq/L (ref 3.7–5.3)
Sodium: 137 mEq/L (ref 137–147)

## 2014-08-27 LAB — CBC
HEMATOCRIT: 35.2 % — AB (ref 39.0–52.0)
Hemoglobin: 11.9 g/dL — ABNORMAL LOW (ref 13.0–17.0)
MCH: 30.7 pg (ref 26.0–34.0)
MCHC: 33.8 g/dL (ref 30.0–36.0)
MCV: 91 fL (ref 78.0–100.0)
Platelets: 212 10*3/uL (ref 150–400)
RBC: 3.87 MIL/uL — ABNORMAL LOW (ref 4.22–5.81)
RDW: 13.1 % (ref 11.5–15.5)
WBC: 5.6 10*3/uL (ref 4.0–10.5)

## 2014-08-27 SURGERY — OPEN REDUCTION INTERNAL FIXATION (ORIF) ANKLE FRACTURE
Anesthesia: General | Site: Ankle | Laterality: Left

## 2014-08-27 MED ORDER — HYDROMORPHONE HCL 1 MG/ML IJ SOLN
0.2500 mg | INTRAMUSCULAR | Status: DC | PRN
  Administered 2014-08-27 (×4): 0.5 mg via INTRAVENOUS
  Administered 2014-08-27: 0.25 mg via INTRAVENOUS
  Administered 2014-08-27: 0.5 mg via INTRAVENOUS

## 2014-08-27 MED ORDER — LACTATED RINGERS IV SOLN
INTRAVENOUS | Status: DC
  Administered 2014-08-27: 09:00:00 via INTRAVENOUS
  Administered 2014-08-27: 1000 mL via INTRAVENOUS

## 2014-08-27 MED ORDER — HYDROMORPHONE HCL 1 MG/ML IJ SOLN
INTRAMUSCULAR | Status: AC
  Filled 2014-08-27: qty 1

## 2014-08-27 MED ORDER — ARTIFICIAL TEARS OP OINT
TOPICAL_OINTMENT | OPHTHALMIC | Status: AC
  Filled 2014-08-27: qty 3.5

## 2014-08-27 MED ORDER — BUPIVACAINE-EPINEPHRINE (PF) 0.5% -1:200000 IJ SOLN
INTRAMUSCULAR | Status: DC | PRN
  Administered 2014-08-27: 30 mL via PERINEURAL

## 2014-08-27 MED ORDER — ONDANSETRON HCL 4 MG/2ML IJ SOLN
INTRAMUSCULAR | Status: DC | PRN
  Administered 2014-08-27: 4 mg via INTRAVENOUS

## 2014-08-27 MED ORDER — PROPOFOL 10 MG/ML IV BOLUS
INTRAVENOUS | Status: AC
  Filled 2014-08-27: qty 20

## 2014-08-27 MED ORDER — MIDAZOLAM HCL 2 MG/2ML IJ SOLN
INTRAMUSCULAR | Status: AC
  Administered 2014-08-27: 2 mg
  Filled 2014-08-27: qty 2

## 2014-08-27 MED ORDER — MIDAZOLAM HCL 2 MG/2ML IJ SOLN
INTRAMUSCULAR | Status: AC
  Filled 2014-08-27: qty 2

## 2014-08-27 MED ORDER — OXYCODONE-ACETAMINOPHEN 5-325 MG PO TABS: 1.0000 | ORAL_TABLET | ORAL | Status: DC | PRN

## 2014-08-27 MED ORDER — LACTATED RINGERS IV SOLN
INTRAVENOUS | Status: DC | PRN
  Administered 2014-08-27: 10:00:00 via INTRAVENOUS

## 2014-08-27 MED ORDER — MIDAZOLAM HCL 2 MG/2ML IJ SOLN: 0.5000 mg | Freq: Once | INTRAMUSCULAR | Status: DC | PRN

## 2014-08-27 MED ORDER — PROMETHAZINE HCL 25 MG/ML IJ SOLN
INTRAMUSCULAR | Status: AC
  Filled 2014-08-27: qty 1

## 2014-08-27 MED ORDER — FENTANYL CITRATE 0.05 MG/ML IJ SOLN
INTRAMUSCULAR | Status: DC | PRN
  Administered 2014-08-27: 50 ug via INTRAVENOUS

## 2014-08-27 MED ORDER — FENTANYL CITRATE 0.05 MG/ML IJ SOLN
100.0000 ug | Freq: Once | INTRAMUSCULAR | Status: AC
  Administered 2014-08-27: 100 ug via INTRAVENOUS

## 2014-08-27 MED ORDER — ARTIFICIAL TEARS OP OINT
TOPICAL_OINTMENT | OPHTHALMIC | Status: DC | PRN
  Administered 2014-08-27: 1 via OPHTHALMIC

## 2014-08-27 MED ORDER — MEPERIDINE HCL 25 MG/ML IJ SOLN: 6.2500 mg | INTRAMUSCULAR | Status: DC | PRN

## 2014-08-27 MED ORDER — CEFAZOLIN SODIUM-DEXTROSE 2-3 GM-% IV SOLR
2.0000 g | INTRAVENOUS | Status: AC
  Administered 2014-08-27: 2 g via INTRAVENOUS

## 2014-08-27 MED ORDER — CEFAZOLIN SODIUM-DEXTROSE 2-3 GM-% IV SOLR
INTRAVENOUS | Status: AC
  Filled 2014-08-27: qty 50

## 2014-08-27 MED ORDER — MIDAZOLAM HCL 2 MG/2ML IJ SOLN: 2.0000 mg | Freq: Once | INTRAMUSCULAR | Status: DC

## 2014-08-27 MED ORDER — ONDANSETRON HCL 4 MG/2ML IJ SOLN
INTRAMUSCULAR | Status: AC
  Filled 2014-08-27: qty 2

## 2014-08-27 MED ORDER — POVIDONE-IODINE 7.5 % EX SOLN: Freq: Once | CUTANEOUS | Status: DC

## 2014-08-27 MED ORDER — OXYCODONE HCL 5 MG PO TABS
ORAL_TABLET | ORAL | Status: AC
  Filled 2014-08-27: qty 1

## 2014-08-27 MED ORDER — LIDOCAINE HCL (CARDIAC) 20 MG/ML IV SOLN
INTRAVENOUS | Status: DC | PRN
  Administered 2014-08-27: 80 mg via INTRAVENOUS

## 2014-08-27 MED ORDER — OXYCODONE HCL 5 MG PO TABS: 5.0000 mg | ORAL_TABLET | Freq: Once | ORAL | Status: DC | PRN

## 2014-08-27 MED ORDER — HYDROMORPHONE HCL 1 MG/ML IJ SOLN
INTRAMUSCULAR | Status: AC
  Administered 2014-08-27: 0.5 mg via INTRAVENOUS
  Filled 2014-08-27: qty 2

## 2014-08-27 MED ORDER — OXYCODONE HCL 5 MG/5ML PO SOLN: 5.0000 mg | Freq: Once | ORAL | Status: DC | PRN

## 2014-08-27 MED ORDER — FENTANYL CITRATE 0.05 MG/ML IJ SOLN
INTRAMUSCULAR | Status: AC
  Filled 2014-08-27: qty 2

## 2014-08-27 MED ORDER — PROPOFOL 10 MG/ML IV BOLUS
INTRAVENOUS | Status: DC | PRN
  Administered 2014-08-27: 130 mg via INTRAVENOUS

## 2014-08-27 MED ORDER — PROMETHAZINE HCL 25 MG/ML IJ SOLN
6.2500 mg | INTRAMUSCULAR | Status: DC | PRN
  Administered 2014-08-27: 12.5 mg via INTRAVENOUS

## 2014-08-27 MED ORDER — FENTANYL CITRATE 0.05 MG/ML IJ SOLN
INTRAMUSCULAR | Status: AC
  Filled 2014-08-27: qty 5

## 2014-08-27 MED ORDER — DOCUSATE SODIUM 100 MG PO CAPS: 100.0000 mg | ORAL_CAPSULE | Freq: Three times a day (TID) | ORAL | Status: DC | PRN

## 2014-08-27 SURGICAL SUPPLY — 68 items
BANDAGE ELASTIC 6 VELCRO ST LF (GAUZE/BANDAGES/DRESSINGS) ×5 IMPLANT
BANDAGE ESMARK 6X9 LF (GAUZE/BANDAGES/DRESSINGS) ×1 IMPLANT
BIT DRILL 2.5X110 QC LCP DISP (BIT) ×2 IMPLANT
BIT DRILL 2.8 (BIT) ×1
BIT DRILL CANN 2.7X625 NONSTRL (BIT) ×2 IMPLANT
BIT DRILL CANN QC 2.8X165 (BIT) IMPLANT
BLADE SURG 10 STRL SS (BLADE) ×3 IMPLANT
BLADE SURG ROTATE 9660 (MISCELLANEOUS) IMPLANT
BNDG CMPR 9X6 STRL LF SNTH (GAUZE/BANDAGES/DRESSINGS) ×1
BNDG COHESIVE 4X5 TAN STRL (GAUZE/BANDAGES/DRESSINGS) ×3 IMPLANT
BNDG ESMARK 6X9 LF (GAUZE/BANDAGES/DRESSINGS) ×3
COVER MAYO STAND STRL (DRAPES) ×3 IMPLANT
COVER SURGICAL LIGHT HANDLE (MISCELLANEOUS) ×3 IMPLANT
CUFF TOURNIQUET SINGLE 34IN LL (TOURNIQUET CUFF) ×3 IMPLANT
CUFF TOURNIQUET SINGLE 44IN (TOURNIQUET CUFF) IMPLANT
DRAPE OEC MINIVIEW 54X84 (DRAPES) IMPLANT
DRAPE U-SHAPE 47X51 STRL (DRAPES) ×3 IMPLANT
DRILL BIT 2.8MM (BIT) ×3
DRSG PAD ABDOMINAL 8X10 ST (GAUZE/BANDAGES/DRESSINGS) ×2 IMPLANT
DURAPREP 26ML APPLICATOR (WOUND CARE) ×3 IMPLANT
ELECT REM PT RETURN 9FT ADLT (ELECTROSURGICAL) ×3
ELECTRODE REM PT RTRN 9FT ADLT (ELECTROSURGICAL) ×1 IMPLANT
GAUZE SPONGE 4X4 12PLY STRL (GAUZE/BANDAGES/DRESSINGS) ×3 IMPLANT
GAUZE XEROFORM 1X8 LF (GAUZE/BANDAGES/DRESSINGS) ×3 IMPLANT
GLOVE BIO SURGEON STRL SZ7 (GLOVE) ×3 IMPLANT
GLOVE BIO SURGEON STRL SZ7.5 (GLOVE) ×3 IMPLANT
GLOVE BIOGEL PI IND STRL 8 (GLOVE) ×1 IMPLANT
GLOVE BIOGEL PI INDICATOR 8 (GLOVE) ×2
GOWN STRL REUS W/ TWL LRG LVL3 (GOWN DISPOSABLE) ×3 IMPLANT
GOWN STRL REUS W/ TWL XL LVL3 (GOWN DISPOSABLE) ×1 IMPLANT
GOWN STRL REUS W/TWL LRG LVL3 (GOWN DISPOSABLE) ×9
GOWN STRL REUS W/TWL XL LVL3 (GOWN DISPOSABLE) ×3
GUIDEWIRE THREADED 150MM (WIRE) ×4 IMPLANT
KIT 1/3 TUB PL 6H 73M (Orthopedic Implant) IMPLANT
KIT BASIN OR (CUSTOM PROCEDURE TRAY) ×3 IMPLANT
KIT ROOM TURNOVER OR (KITS) ×3 IMPLANT
MANIFOLD NEPTUNE II (INSTRUMENTS) ×3 IMPLANT
NEEDLE 22X1 1/2 (OR ONLY) (NEEDLE) IMPLANT
NS IRRIG 1000ML POUR BTL (IV SOLUTION) ×3 IMPLANT
PACK ORTHO EXTREMITY (CUSTOM PROCEDURE TRAY) ×3 IMPLANT
PAD ARMBOARD 7.5X6 YLW CONV (MISCELLANEOUS) ×6 IMPLANT
PAD CAST 4YDX4 CTTN HI CHSV (CAST SUPPLIES) ×1 IMPLANT
PADDING CAST COTTON 4X4 STRL (CAST SUPPLIES) ×3
PROS 1/3 TUB PL 6H 73M (Orthopedic Implant) ×3 IMPLANT
SCREW CANC FT/18 4.0 (Screw) ×2 IMPLANT
SCREW CORTEX 3.5 14MM (Screw) ×2 IMPLANT
SCREW LOCK CORT ST 3.5X14 (Screw) IMPLANT
SCREW LOCK T15 FT 14X3.5X2.9X (Screw) IMPLANT
SCREW LOCK T15 FT 18X3.5X2.9X (Screw) IMPLANT
SCREW LOCKING 3.5X14 (Screw) ×6 IMPLANT
SCREW LOCKING 3.5X18 (Screw) ×3 IMPLANT
SCREW SHORT THREAD 4.0X40 (Screw) ×4 IMPLANT
SPONGE GAUZE 4X4 12PLY STER LF (GAUZE/BANDAGES/DRESSINGS) ×2 IMPLANT
SPONGE LAP 4X18 X RAY DECT (DISPOSABLE) ×6 IMPLANT
STAPLER VISISTAT 35W (STAPLE) IMPLANT
SUCTION FRAZIER TIP 10 FR DISP (SUCTIONS) ×3 IMPLANT
SUT ETHILON 3 0 PS 1 (SUTURE) ×6 IMPLANT
SUT ETHILON 4 0 PS 2 18 (SUTURE) IMPLANT
SUT VIC AB 0 CTB1 27 (SUTURE) IMPLANT
SUT VIC AB 2-0 CT1 27 (SUTURE) ×3
SUT VIC AB 2-0 CT1 TAPERPNT 27 (SUTURE) IMPLANT
SUT VIC AB 2-0 FS1 27 (SUTURE) IMPLANT
SYR CONTROL 10ML LL (SYRINGE) IMPLANT
TOWEL OR 17X24 6PK STRL BLUE (TOWEL DISPOSABLE) ×3 IMPLANT
TOWEL OR 17X26 10 PK STRL BLUE (TOWEL DISPOSABLE) ×3 IMPLANT
TUBE CONNECTING 12'X1/4 (SUCTIONS) ×1
TUBE CONNECTING 12X1/4 (SUCTIONS) ×2 IMPLANT
WATER STERILE IRR 1000ML POUR (IV SOLUTION) ×3 IMPLANT

## 2014-08-27 NOTE — Op Note (Signed)
Procedure(s):  open reduction internal fixation left ankle Procedure Note  [name] male 58 y.o. 08/27/2014  Procedure(s) and Anesthesia Type:    *  open reduction internal fixation bimalleolar left ankle -   Surgeon(s) and Role:    * Nita Sells, MD - Primary   Indications:  57 y.o. male s/p fall with left ankle fracture. Indicated for surgery to promote anatomic restoration of joint.     Surgeon: Nita Sells   Assistants: Jeanmarie Hubert PA-C (Danielle was present and scrubbed throughout the procedure and was essential in positioning, retraction, exposure, and closure)  Anesthesia: General endotracheal anesthesia with preoperative regional block given by the attending anesthesiologist    Procedure Detail   open reduction internal fixation left ankle  Findings: Anatomic reduction with a 6 hole one third tubular plate laterally with a combination of locking and non-locking screws, 2 4-0 cannulated medial screws. Bone quality was poor.  Estimated Blood Loss:  Minimal         Drains: none  Blood Given: none         Specimens: none        Complications:  * No complications entered in OR log *         Disposition: PACU - hemodynamically stable.         Condition: stable    Procedure:  The patient was identified in the preoperative  holding area where I personally marked the operative site after  verifying site side and procedure with the patient. The patient was taken back  to the operating room where general anesthesia was induced without  Complication. The patient was placed in supine position with a bump under the operative hip. A non sterile tourniquet was applied to the thigh. The patient did receive IV antibiotics prior to the incision.   After the appropriate time-out, the limb was exsanguinated and the tourniquet was elevated to 300 mmHg.   A lateral incision was made over the fracture site and dissection was carried down  the lateral fibula.  The fracture was a exposed and cleaned of hematoma.  Reduction was carried out using reduction forceps and manipulation of the ankle.  An interfragmentary lag screw was not placed.  A 6 hole plate was laid laterally and felt to be appropriate sized.  Holes proximal and distal to the fracture were sequentially drill, measured and filled with appropriate sized bicortical and unicortical screws. Locking screws were used to possible given the poor bone quality. Appropriate length and alignment were verified on fluoroscopic imaging.    Attention was then turned to the medial side were a approximate 4 cm curvalinear incision was made over the distal medial malleolus.  Dissection was carried down to the fracture and the fracture was exposed. The joint was irrigated.  No loose fragment were noted in the joint.  The fracture was held anatomically reduced with a reduction forceps and two guidewires were passed across the fracture. The reduction and pin position was verified with fluoro and then 2 40 mm partially threaded cancellous screws were placed after over drilling the outer cortex.  Good fixation was noted.  The syndesmosis was stressed and felt to be intact.   The wounds were then copiously irrigated and closed in layers with 2-0 vicryl in a deep layer and 3-0 nylon for skin closure.  Sterile dressings were then applied and well padded well molded splint in a plantigrade position was applied.  The tourniquet was let down for total tourniquet  time of approximately 50 minutes.  The patient was then allowed to awaken from [place], taken to the PACU in stable condition.  POSTOPERATIVE PLAN: The patient will be non-weightbearing on the operative Extremity and will follow up in 10-14 days for wound check.

## 2014-08-27 NOTE — H&P (Signed)
[name] is an 58 y.o. male.   Chief Complaint: L ankle injury HPI: L ankle bimal fx s/p fall.  Past Medical History  Diagnosis Date  . Crohn's disease   . History of aseptic necrosis of bone BILATERAL HIPS    S/P BONE GRAFT  . History of kidney stones   . Bladder stone   . S/P ileostomy   . History of mycosis fungoides   . Cancer     mycosis fungoides    Past Surgical History  Procedure Laterality Date  . Right ureteroscopic stone extraciton  04-24-2005  & 12-01-2002  . Cysto/ bilateral retrograde pyelogram/ left ureteral stone extraction / bilateral stent placement  10-07-2003  . Exploratory laparotomy/ resection of ileocolonic anastomosis and creation of ileostomy  11-04-2000    ABD. PERFORATION  . Expl. lap. w/ enterolysis, resection inflammatory mass rlq / take-down of fistula with enteroenterostomy/ resection with the small bowel to ascending colon anastomosis  10-29-2000    CROHN'S DISEASE W/ ANASTOMOTIC INFLAMMATORY MASS/    10-30-2000 EXPL. LAP. CONTROL POST-OP ABD. BLEEDING  . Bone graft of left hip  1986    ASEPTIC NECROSIS  . Resection of terminal ileum  1983  . Cystoscopy with litholapaxy  02/12/2012    Procedure: CYSTOSCOPY WITH LITHOLAPAXY;  Surgeon: Claybon Jabs, MD;  Location: [facility];  Service: Urology;  Laterality: N/A;  . Colonoscopy      Family History  Problem Relation Age of Onset  . Alzheimer's disease Father   . Cancer Father   . Cancer Sister    Social History:  reports that he quit smoking about 20 years ago. He has never used smokeless tobacco. He reports that he does not drink alcohol or use illicit drugs.  Allergies: No Known Allergies  Medications Prior to Admission  Medication Sig Dispense Refill  . naproxen (NAPROSYN) 500 MG tablet Take 1 tablet (500 mg total) by mouth 2 (two) times daily as needed for mild pain, moderate pain or headache (TAKE WITH MEALS.).  20 tablet  0  . oxyCODONE-acetaminophen (PERCOCET)  5-325 MG per tablet Take 1 tablet by mouth every 6 (six) hours as needed for severe pain.  15 tablet  0  . potassium citrate (UROCIT-K) 10 MEQ (1080 MG) SR tablet Take 40 mEq by mouth 4 (four) times daily.         Results for orders placed during the hospital encounter of 08/27/14 (from the past 48 hour(s))  CBC     Status: Abnormal   Collection Time    08/27/14  8:02 AM      Result Value Ref Range   WBC 5.6  4.0 - 10.5 K/uL   RBC 3.87 (*) 4.22 - 5.81 MIL/uL   Hemoglobin 11.9 (*) 13.0 - 17.0 g/dL   HCT 35.2 (*) 39.0 - 52.0 %   MCV 91.0  78.0 - 100.0 fL   MCH 30.7  26.0 - 34.0 pg   MCHC 33.8  30.0 - 36.0 g/dL   RDW 13.1  11.5 - 15.5 %   Platelets 212  150 - 400 K/uL  BASIC METABOLIC PANEL     Status: Abnormal   Collection Time    08/27/14  8:11 AM      Result Value Ref Range   Sodium 137  137 - 147 mEq/L   Potassium 3.9  3.7 - 5.3 mEq/L   Chloride 105  96 - 112 mEq/L   CO2 18 (*) 19 - 32 mEq/L  Glucose, Bld 148 (*) 70 - 99 mg/dL   BUN 16  6 - 23 mg/dL   Creatinine, Ser 1.10  0.50 - 1.35 mg/dL   Calcium 8.5  8.4 - 10.5 mg/dL   GFR calc non Af Amer 72 (*) >90 mL/min   GFR calc Af Amer 83 (*) >90 mL/min   Comment: (NOTE)     The eGFR has been calculated using the CKD EPI equation.     This calculation has not been validated in all clinical situations.     eGFR's persistently <90 mL/min signify possible Chronic Kidney     Disease.   Anion gap 14  5 - 15   No results found.  Review of Systems  All other systems reviewed and are negative.   Blood pressure 95/68, pulse 76, temperature 98 F (36.7 C), temperature source Oral, resp. rate 11, height $RemoveBe'5\' 6"'IEhmtsECf$  (1.676 m), weight 72.576 kg (160 lb), SpO2 100.00%. Physical Exam  Constitutional: He is oriented to person, place, and time. He appears well-developed and well-nourished.  HENT:  Head: Atraumatic.  Eyes: EOM are normal.  Cardiovascular: Intact distal pulses.   Respiratory: Effort normal.  Musculoskeletal:  L ankle mild  swelling. NVID.  Neurological: He is alert and oriented to person, place, and time.  Skin: Skin is warm and dry.  Psychiatric: He has a normal mood and affect.     Assessment/Plan L ankle bimal fx Plan ORIF Risks / benefits of surgery discussed Consent on chart  NPO for OR Preop antibiotics   [name] 08/27/2014, 9:48 AM

## 2014-08-27 NOTE — Transfer of Care (Signed)
Immediate Anesthesia Transfer of Care Note  Patient: [name]  Procedure(s) Performed: Procedure(s) with comments:  open reduction internal fixation left ankle (Left) - Left open reduction internal fixation ankle  Patient Location: PACU  Anesthesia Type:General  Level of Consciousness: awake, alert  and oriented  Airway & Oxygen Therapy: Patient Spontanous Breathing and Patient connected to nasal cannula oxygen  Post-op Assessment: Report given to PACU RN and Post -op Vital signs reviewed and stable  Post vital signs: Reviewed and stable  Complications: No apparent anesthesia complications

## 2014-08-27 NOTE — Anesthesia Postprocedure Evaluation (Signed)
  Anesthesia Post-op Note  Patient: [name]  Procedure(s) Performed: Procedure(s) with comments:  open reduction internal fixation left ankle (Left) - Left open reduction internal fixation ankle  Patient Location: PACU  Anesthesia Type:GA combined with regional for post-op pain  Level of Consciousness: awake, alert , oriented and patient cooperative  Airway and Oxygen Therapy: Patient Spontanous Breathing and Patient connected to nasal cannula oxygen  Post-op Pain: mild  Post-op Assessment: Post-op Vital signs reviewed, Patient's Cardiovascular Status Stable, Respiratory Function Stable, Patent Airway, No signs of Nausea or vomiting and Pain level controlled  Post-op Vital Signs: Reviewed and stable  Last Vitals:  Filed Vitals:   08/27/14 1245  BP: 110/66  Pulse: 86  Temp: 36.6 C  Resp: 14    Complications: No apparent anesthesia complications

## 2014-08-27 NOTE — Discharge Instructions (Signed)
Discharge Instructions after Ankle fracture  Keep splint on until your first post op visit with Dr. Tamera Punt, keep it dry.  Do not put any weight on operative leg, use cruches Elevate as frequently as possible. Pain medicine has been prescribed for you.  Use your medicine liberally over the first 48 hours, and then you can begin to taper your use. You may take Extra Strength Tylenol or Tylenol only in place of the pain pills. DO NOT take ANY nonsteroidal anti-inflammatory pain medications: Advil, Motrin, Ibuprofen, Aleve, Naproxen or Naprosyn. .  Take one aspirin, a day for 2 weeks after surgery, unless you have an aspirin sensitivity/ allergy or asthma.   Please call [phone] during normal business hours or [phone] after hours for any problems. Including the following:  - excessive redness of the incisions - drainage for more than 4 days - fever of more than 101.5 F  *Please note that pain medications will not be refilled after hours or on weekends.  What to eat:  For your first meals, you should eat lightly; only small meals initially.  If you do not have nausea, you may eat larger meals.  Avoid spicy, greasy and heavy food.    General Anesthesia, Adult, Care After  Refer to this sheet in the next few weeks. These instructions provide you with information on caring for yourself after your procedure. Your health care provider may also give you more specific instructions. Your treatment has been planned according to current medical practices, but problems sometimes occur. Call your health care provider if you have any problems or questions after your procedure.  WHAT TO EXPECT AFTER THE PROCEDURE  After the procedure, it is typical to experience:  Sleepiness.  Nausea and vomiting. HOME CARE INSTRUCTIONS  For the first 24 hours after general anesthesia:  Have a responsible person with you.  Do not drive a car. If you are alone, do not take public transportation.  Do not drink  alcohol.  Do not take medicine that has not been prescribed by your health care provider.  Do not sign important papers or make important decisions.  You may resume a normal diet and activities as directed by your health care provider.  Change bandages (dressings) as directed.  If you have questions or problems that seem related to general anesthesia, call the hospital and ask for the anesthetist or anesthesiologist on call. SEEK MEDICAL CARE IF:  You have nausea and vomiting that continue the day after anesthesia.  You develop a rash. SEEK IMMEDIATE MEDICAL CARE IF:  You have difficulty breathing.  You have chest pain.  You have any allergic problems. Document Released: 02/05/2001 Document Revised: 07/02/2013 Document Reviewed: 05/15/2013  [facility] Patient Information 2014 [place], [place].

## 2014-08-27 NOTE — Anesthesia Procedure Notes (Addendum)
Anesthesia Regional Block:  Popliteal block  Pre-Anesthetic Checklist: ,, timeout performed, Correct Patient, Correct Site, Correct Laterality, Correct Procedure, Correct Position, site marked, Risks and benefits discussed, pre-op evaluation,  At surgeon's request and post-op pain management  Laterality: Lower and Left  Prep: chloraprep       Needles:  Injection technique: Single-shot  Needle Type: Stimulator Needle - 80     Needle Length: 9cm 9 cm Needle Gauge: 22 and 22 G  Needle insertion depth: 6 cm   Additional Needles:  Procedures: ultrasound guided (picture in chart) and nerve stimulator Popliteal block  Nerve Stimulator or Paresthesia:  Response: Twitch elicited, 0.8 mA, 0.3 ms, 6 cm  Additional Responses:   Narrative:  Start time: 08/27/2014 9:15 AM End time: 08/27/2014 9:30 AM Injection made incrementally with aspirations every 30 mL.  Performed by: Personally  Anesthesiologist: Ethelene Hal, MD  Additional Notes: This is a lateral approach to the popliteal fossa area. A stimulator needle is used starting at 1.5 mAmp current and descending as nerve contact is made. Injection of anesthetic is in 5 ml increments with multiple neg aspirations before continuing. Total volume is 40 cc.     Procedure Name: LMA Insertion Date/Time: 08/27/2014 10:49 AM Performed by: Garner Nash Pre-anesthesia Checklist: Patient identified, Timeout performed, Emergency Drugs available, Suction available and Patient being monitored Patient Re-evaluated:Patient Re-evaluated prior to inductionOxygen Delivery Method: Circle system utilized Preoxygenation: Pre-oxygenation with 100% oxygen Intubation Type: IV induction Ventilation: Mask ventilation without difficulty LMA: LMA inserted LMA Size: 4.0 Tube type: Oral Placement Confirmation: positive ETCO2,  CO2 detector and breath sounds checked- equal and bilateral Dental Injury: Teeth and Oropharynx as per pre-operative assessment

## 2014-08-27 NOTE — Anesthesia Preprocedure Evaluation (Addendum)
Anesthesia Evaluation  Patient identified by MRN, date of birth, ID band Patient awake    Reviewed: Allergy & Precautions, H&P , NPO status , Patient's Chart, lab work & pertinent test results  Airway Mallampati: I  Neck ROM: Full    Dental  (+) Dental Advisory Given   Pulmonary former smoker,          Cardiovascular Rhythm:Regular     Neuro/Psych    GI/Hepatic chrohns disease   Endo/Other    Renal/GU Renal diseaseStones      Musculoskeletal  (+) Arthritis -,   Abdominal   Peds  Hematology   Anesthesia Other Findings   Reproductive/Obstetrics                          Anesthesia Physical Anesthesia Plan  ASA: II  Anesthesia Plan: General   Post-op Pain Management:    Induction: Intravenous  Airway Management Planned:   Additional Equipment:   Intra-op Plan:   Post-operative Plan: Extubation in OR  Informed Consent: I have reviewed the patients History and Physical, chart, labs and discussed the procedure including the risks, benefits and alternatives for the proposed anesthesia with the patient or authorized representative who has indicated his/her understanding and acceptance.     Plan Discussed with:   Anesthesia Plan Comments:         Anesthesia Quick Evaluation

## 2014-08-29 NOTE — ED Provider Notes (Signed)
Medical screening examination/treatment/procedure(s) were performed by non-physician practitioner and as supervising physician I was immediately available for consultation/collaboration.   EKG Interpretation None       Babette Relic, MD 08/29/14 2210

## 2014-09-01 ENCOUNTER — Encounter (HOSPITAL_COMMUNITY): Payer: Self-pay | Admitting: Orthopedic Surgery

## 2014-09-09 ENCOUNTER — Encounter (HOSPITAL_BASED_OUTPATIENT_CLINIC_OR_DEPARTMENT_OTHER): Payer: Self-pay | Admitting: *Deleted

## 2014-09-09 ENCOUNTER — Encounter (HOSPITAL_BASED_OUTPATIENT_CLINIC_OR_DEPARTMENT_OTHER)
Admission: RE | Disposition: A | Discharge status: Home / Self Care | Payer: Self-pay | Source: Ambulatory Visit | Attending: Orthopedic Surgery

## 2014-09-09 ENCOUNTER — Other Ambulatory Visit: Payer: Self-pay | Admitting: Orthopedic Surgery

## 2014-09-09 ENCOUNTER — Ambulatory Visit (HOSPITAL_BASED_OUTPATIENT_CLINIC_OR_DEPARTMENT_OTHER)
Admission: RE | Admit: 2014-09-09 | Discharge: 2014-09-09 | Disposition: A | Discharge status: Home / Self Care | Payer: 59 | Source: Ambulatory Visit | Attending: Orthopedic Surgery | Admitting: Orthopedic Surgery

## 2014-09-09 DIAGNOSIS — L03011 Cellulitis of right finger: Secondary | ICD-10-CM | POA: Diagnosis present

## 2014-09-09 DIAGNOSIS — L02511 Cutaneous abscess of right hand: Secondary | ICD-10-CM

## 2014-09-09 HISTORY — PX: IRRIGATION AND DEBRIDEMENT ABSCESS: SHX5252

## 2014-09-09 SURGERY — MINOR INCISION AND DRAINAGE OF ABSCESS
Anesthesia: LOCAL | Site: Finger | Laterality: Right

## 2014-09-09 MED ORDER — BUPIVACAINE HCL (PF) 0.5 % IJ SOLN
INTRAMUSCULAR | Status: AC
  Filled 2014-09-09: qty 30

## 2014-09-09 MED ORDER — LIDOCAINE HCL (PF) 1 % IJ SOLN
INTRAMUSCULAR | Status: AC
  Filled 2014-09-09: qty 30

## 2014-09-09 MED ORDER — LIDOCAINE-EPINEPHRINE 1 %-1:100000 IJ SOLN
INTRAMUSCULAR | Status: AC
  Filled 2014-09-09: qty 1

## 2014-09-09 MED ORDER — BUPIVACAINE HCL (PF) 0.25 % IJ SOLN
INTRAMUSCULAR | Status: AC
  Filled 2014-09-09: qty 30

## 2014-09-09 MED ORDER — LIDOCAINE-EPINEPHRINE (PF) 1 %-1:200000 IJ SOLN
INTRAMUSCULAR | Status: DC | PRN
  Administered 2014-09-09: 5 mL

## 2014-09-09 MED ORDER — SODIUM BICARBONATE 4 % IV SOLN
INTRAVENOUS | Status: AC
  Filled 2014-09-09: qty 5

## 2014-09-09 SURGICAL SUPPLY — 65 items
APL SKNCLS STERI-STRIP NONHPOA (GAUZE/BANDAGES/DRESSINGS)
BAG DECANTER FOR FLEXI CONT (MISCELLANEOUS) IMPLANT
BANDAGE ELASTIC 3 VELCRO ST LF (GAUZE/BANDAGES/DRESSINGS) ×2 IMPLANT
BANDAGE ELASTIC 4 VELCRO ST LF (GAUZE/BANDAGES/DRESSINGS) IMPLANT
BENZOIN TINCTURE PRP APPL 2/3 (GAUZE/BANDAGES/DRESSINGS) IMPLANT
BLADE SURG 15 STRL LF DISP TIS (BLADE) ×1 IMPLANT
BLADE SURG 15 STRL SS (BLADE) ×2
BNDG CMPR 9X4 STRL LF SNTH (GAUZE/BANDAGES/DRESSINGS)
BNDG COHESIVE 1X5 TAN STRL LF (GAUZE/BANDAGES/DRESSINGS) ×1 IMPLANT
BNDG ESMARK 4X9 LF (GAUZE/BANDAGES/DRESSINGS) IMPLANT
BNDG GAUZE ELAST 4 BULKY (GAUZE/BANDAGES/DRESSINGS) IMPLANT
CORDS BIPOLAR (ELECTRODE) ×1 IMPLANT
COVER BACK TABLE 60X90IN (DRAPES) ×2 IMPLANT
CUFF TOURNIQUET SINGLE 18IN (TOURNIQUET CUFF) IMPLANT
DECANTER SPIKE VIAL GLASS SM (MISCELLANEOUS) IMPLANT
DRAPE EXTREMITY TIBURON (DRAPES) ×2 IMPLANT
DRAPE SURG 17X23 STRL (DRAPES) ×2 IMPLANT
DURAPREP 26ML APPLICATOR (WOUND CARE) ×2 IMPLANT
GAUZE PACKING IODOFORM 1/4X15 (GAUZE/BANDAGES/DRESSINGS) IMPLANT
GAUZE SPONGE 4X4 12PLY STRL (GAUZE/BANDAGES/DRESSINGS) ×2 IMPLANT
GAUZE XEROFORM 1X8 LF (GAUZE/BANDAGES/DRESSINGS) ×1 IMPLANT
GLOVE SURG SS PI 7.0 STRL IVOR (GLOVE) ×1 IMPLANT
GLOVE SURG SYN 8.0 (GLOVE) ×2 IMPLANT
GLOVE SURG SYN 8.0 PF PI (GLOVE) ×2 IMPLANT
GOWN STRL REUS W/ TWL LRG LVL3 (GOWN DISPOSABLE) ×1 IMPLANT
GOWN STRL REUS W/TWL LRG LVL3 (GOWN DISPOSABLE) ×2
GOWN STRL REUS W/TWL XL LVL3 (GOWN DISPOSABLE) ×2 IMPLANT
HANDPIECE INTERPULSE COAX TIP (DISPOSABLE)
IV NS IRRIG 3000ML ARTHROMATIC (IV SOLUTION) IMPLANT
LOOP VESSEL MAXI BLUE (MISCELLANEOUS) IMPLANT
NDL HYPO 25X1 1.5 SAFETY (NEEDLE) IMPLANT
NEEDLE 27GAX1X1/2 (NEEDLE) IMPLANT
NEEDLE HYPO 25X1 1.5 SAFETY (NEEDLE) IMPLANT
NS IRRIG 1000ML POUR BTL (IV SOLUTION) ×1 IMPLANT
PACK BASIN DAY SURGERY FS (CUSTOM PROCEDURE TRAY) ×2 IMPLANT
PAD CAST 3X4 CTTN HI CHSV (CAST SUPPLIES) ×1 IMPLANT
PADDING CAST ABS 3INX4YD NS (CAST SUPPLIES)
PADDING CAST ABS 4INX4YD NS (CAST SUPPLIES)
PADDING CAST ABS COTTON 3X4 (CAST SUPPLIES) ×1 IMPLANT
PADDING CAST ABS COTTON 4X4 ST (CAST SUPPLIES) ×1 IMPLANT
PADDING CAST COTTON 3X4 STRL (CAST SUPPLIES)
SET HNDPC FAN SPRY TIP SCT (DISPOSABLE) IMPLANT
SHEET MEDIUM DRAPE 40X70 STRL (DRAPES) ×2 IMPLANT
SPLINT PLASTER CAST XFAST 3X15 (CAST SUPPLIES) IMPLANT
SPLINT PLASTER CAST XFAST 4X15 (CAST SUPPLIES) ×5 IMPLANT
SPLINT PLASTER XTRA FAST SET 4 (CAST SUPPLIES)
SPLINT PLASTER XTRA FASTSET 3X (CAST SUPPLIES)
STOCKINETTE 4X48 STRL (DRAPES) ×2 IMPLANT
STRIP CLOSURE SKIN 1/2X4 (GAUZE/BANDAGES/DRESSINGS) IMPLANT
SUT ETHILON 3 0 PS 1 (SUTURE) IMPLANT
SUT ETHILON 5 0 PS 2 18 (SUTURE) IMPLANT
SUT PROLENE 3 0 PS 2 (SUTURE) IMPLANT
SUT VIC AB 4-0 P-3 18XBRD (SUTURE) IMPLANT
SUT VIC AB 4-0 P3 18 (SUTURE)
SUT VICRYL RAPIDE 4-0 (SUTURE) IMPLANT
SUT VICRYL RAPIDE 4/0 PS 2 (SUTURE) IMPLANT
SWAB COLLECTION DEVICE MRSA (MISCELLANEOUS) ×1 IMPLANT
SYR BULB 3OZ (MISCELLANEOUS) ×2 IMPLANT
SYR CONTROL 10ML LL (SYRINGE) IMPLANT
SYRINGE 10CC LL (SYRINGE) ×2 IMPLANT
TOWEL OR 17X24 6PK STRL BLUE (TOWEL DISPOSABLE) ×2 IMPLANT
TUBE ANAEROBIC SPECIMEN COL (MISCELLANEOUS) ×1 IMPLANT
TUBE CONNECTING 20X1/4 (TUBING) IMPLANT
UNDERPAD 30X30 INCONTINENT (UNDERPADS AND DIAPERS) ×2 IMPLANT
YANKAUER SUCT BULB TIP NO VENT (SUCTIONS) IMPLANT

## 2014-09-09 NOTE — Op Note (Signed)
See note [phone]

## 2014-09-09 NOTE — H&P (Signed)
[name] is an 58 y.o. male.   Chief Complaint: right long volar pain and swelling HPI: as above with several day h/o right long volar pain and swelling  Past Medical History  Diagnosis Date  . Crohn's disease   . History of aseptic necrosis of bone BILATERAL HIPS    S/P BONE GRAFT  . History of kidney stones   . Bladder stone   . S/P ileostomy   . History of mycosis fungoides   . Cancer     mycosis fungoides    Past Surgical History  Procedure Laterality Date  . Right ureteroscopic stone extraciton  04-24-2005  & 12-01-2002  . Cysto/ bilateral retrograde pyelogram/ left ureteral stone extraction / bilateral stent placement  10-07-2003  . Exploratory laparotomy/ resection of ileocolonic anastomosis and creation of ileostomy  11-04-2000    ABD. PERFORATION  . Expl. lap. w/ enterolysis, resection inflammatory mass rlq / take-down of fistula with enteroenterostomy/ resection with the small bowel to ascending colon anastomosis  10-29-2000    CROHN'S DISEASE W/ ANASTOMOTIC INFLAMMATORY MASS/    10-30-2000 EXPL. LAP. CONTROL POST-OP ABD. BLEEDING  . Bone graft of left hip  1986    ASEPTIC NECROSIS  . Resection of terminal ileum  1983  . Cystoscopy with litholapaxy  02/12/2012    Procedure: CYSTOSCOPY WITH LITHOLAPAXY;  Surgeon: Claybon Jabs, MD;  Location: [facility];  Service: Urology;  Laterality: N/A;  . Colonoscopy    . Orif ankle fracture Left 08/27/2014    Procedure:  open reduction internal fixation left ankle;  Surgeon: Nita Sells, MD;  Location: [place];  Service: Orthopedics;  Laterality: Left;  Left open reduction internal fixation ankle    Family History  Problem Relation Age of Onset  . Alzheimer's disease Father   . Cancer Father   . Cancer Sister    Social History:  reports that he quit smoking about 20 years ago. He has never used smokeless tobacco. He reports that he does not drink alcohol or use illicit drugs.  Allergies: No  Known Allergies  Medications Prior to Admission  Medication Sig Dispense Refill  . oxyCODONE-acetaminophen (ROXICET) 5-325 MG per tablet Take 1-2 tablets by mouth every 4 (four) hours as needed for severe pain.  60 tablet  0  . potassium citrate (UROCIT-K) 10 MEQ (1080 MG) SR tablet Take 40 mEq by mouth 4 (four) times daily.       Marland Kitchen docusate sodium (COLACE) 100 MG capsule Take 1 capsule (100 mg total) by mouth 3 (three) times daily as needed.  20 capsule  0    No results found for this or any previous visit (from the past 48 hour(s)). No results found.  Review of Systems  All other systems reviewed and are negative.   Blood pressure 129/89, pulse 109, temperature 98.5 F (36.9 C), temperature source Oral, resp. rate 20, SpO2 99.00%. Physical Exam  Constitutional: He is oriented to person, place, and time. He appears well-developed and well-nourished.  HENT:  Head: Normocephalic and atraumatic.  Cardiovascular: Normal rate.   Respiratory: Effort normal.  Musculoskeletal:       Right hand: He exhibits tenderness and swelling.  Right long felon  Neurological: He is alert and oriented to person, place, and time.  Skin: Skin is warm.  Psychiatric: He has a normal mood and affect. His behavior is normal. Judgment and thought content normal.     Assessment/Plan As above  Plan I and D  Desiree Fleming A 09/09/2014, 2:08 PM

## 2014-09-10 ENCOUNTER — Encounter (HOSPITAL_BASED_OUTPATIENT_CLINIC_OR_DEPARTMENT_OTHER): Payer: Self-pay | Admitting: Orthopedic Surgery

## 2014-09-10 NOTE — Op Note (Signed)
NAMEARSAL, TAPPAN              ACCOUNT NO.:  [id number]  MEDICAL RECORD NO.:  [id number]  LOCATION:                                 FACILITY:  PHYSICIAN:  Sheral Apley. Alannie Amodio, M.D.DATE OF BIRTH:  [birth year]  DATE OF PROCEDURE:  09/09/2014 DATE OF DISCHARGE:  09/09/2014                              OPERATIVE REPORT   PREOPERATIVE DIAGNOSIS:  Right long finger paronychia/felon.  POSTOPERATIVE DIAGNOSIS:  Right long finger paronychia/felon.  PROCEDURE:  Incision and drainage above.  SURGEON:  Sheral Apley. Burney Gauze, MD  ASSISTANT:  None.  ANESTHESIA:  Local, 5 mL of 1% lidocaine with epinephrine 1:100,000 and 0.5 mL bicarb solution.  TOURNIQUET:  None.  COMPLICATIONS:  None.  DRAINS:  None.  Cultures x2 were sent.  Wound packed open.  DESCRIPTION OF PROCEDURE:  25 minutes prior being taken to the operating suite, I injected 5 mL of 1% lidocaine with epinephrine 1:100,000 and 0.5 mL of bicarb into the proximal phalangeal and middle phalangeal areas volarly, 2.5 mL in each area.  He was then taken to the operating suite.  25 minutes later, we performed an I and D of the felon and paronychia.  We did a radial-sided mid lateral incision on the right long finger up to the paronychia and felon.  Purulence was cultured for aerobic, anaerobic, Gram stain.  Also elevated the felon to the ulnar nail plate and evacuated purulence from under the nail on the ulnar side consistent with a paronychia.  We irrigated with 500 mL if normal saline, packed both wounds open with 1 x 8 Xeroform gauze.  4x4s and Coban wrap was applied.  The patient tolerated the procedure well and went to recovery room in stable fashion.     Sheral Apley Burney Gauze, M.D.    MAW/MEDQ  D:  09/09/2014  T:  09/10/2014  Job:  [id number]

## 2014-09-12 LAB — CULTURE, ROUTINE-ABSCESS

## 2014-09-14 LAB — ANAEROBIC CULTURE

## 2015-07-08 NOTE — Patient Instructions (Signed)
FORTUNE BRANNIGAN  07/08/2015   Your procedure is scheduled on:   07/23/15    Report to [facility]  Entrance take [place]  elevators to 3rd floor to  [place] at    0730 AM.  Call this number if you have problems the morning of surgery [phone]   Remember: ONLY 1 PERSON MAY GO WITH YOU TO SHORT STAY TO GET  READY MORNING OF [place].  Do not eat food or drink liquids :After Midnight.     Take these medicines the morning of surgery with A SIP OF WATER: none                                You may not have any metal on your body including hair pins and              piercings  Do not wear jewelry, , lotions, powders or perfumes, deodorant                      Men may shave face and neck.   Do not bring valuables to the hospital. [place].  Contacts, dentures or bridgework may not be worn into surgery.  Leave suitcase in the car. After surgery it may be brought to your room.   Marland Kitchen    Special Instructions: coughing and deep breathing exercises, leg exercises               Please read over the following fact sheets you were given: _____________________________________________________________________             [facility] - Preparing for Surgery Before surgery, you can play an important role.  Because skin is not sterile, your skin needs to be as free of germs as possible.  You can reduce the number of germs on your skin by washing with CHG (chlorahexidine gluconate) soap before surgery.  CHG is an antiseptic cleaner which kills germs and bonds with the skin to continue killing germs even after washing. Please DO NOT use if you have an allergy to CHG or antibacterial soaps.  If your skin becomes reddened/irritated stop using the CHG and inform your nurse when you arrive at Short Stay. Do not shave (including legs and underarms) for at least 48 hours prior to the first CHG shower.  You may shave your  face/neck. Please follow these instructions carefully:  1.  Shower with CHG Soap the night before surgery and the  morning of Surgery.  2.  If you choose to wash your hair, wash your hair first as usual with your  normal  shampoo.  3.  After you shampoo, rinse your hair and body thoroughly to remove the  shampoo.                           4.  Use CHG as you would any other liquid soap.  You can apply chg directly  to the skin and wash                       Gently with a scrungie or clean washcloth.  5.  Apply the CHG Soap to your  body ONLY FROM THE NECK DOWN.   Do not use on face/ open                           Wound or open sores. Avoid contact with eyes, ears mouth and genitals (private parts).                       Wash face,  Genitals (private parts) with your normal soap.             6.  Wash thoroughly, paying special attention to the area where your surgery  will be performed.  7.  Thoroughly rinse your body with warm water from the neck down.  8.  DO NOT shower/wash with your normal soap after using and rinsing off  the CHG Soap.                9.  Pat yourself dry with a clean towel.            10.  Wear clean pajamas.            11.  Place clean sheets on your bed the night of your first shower and do not  sleep with pets. Day of Surgery : Do not apply any lotions/deodorants the morning of surgery.  Please wear clean clothes to the hospital/surgery center.  FAILURE TO FOLLOW THESE INSTRUCTIONS MAY RESULT IN THE CANCELLATION OF YOUR SURGERY PATIENT SIGNATURE_________________________________  NURSE SIGNATURE__________________________________  ________________________________________________________________________  WHAT IS A BLOOD TRANSFUSION? Blood Transfusion Information  A transfusion is the replacement of blood or some of its parts. Blood is made up of multiple cells which provide different functions.  Red blood cells carry oxygen and are used for blood loss  replacement.  White blood cells fight against infection.  Platelets control bleeding.  Plasma helps clot blood.  Other blood products are available for specialized needs, such as hemophilia or other clotting disorders. BEFORE THE TRANSFUSION  Who gives blood for transfusions?   Healthy volunteers who are fully evaluated to make sure their blood is safe. This is blood bank blood. Transfusion therapy is the safest it has ever been in the practice of medicine. Before blood is taken from a donor, a complete history is taken to make sure that person has no history of diseases nor engages in risky social behavior (examples are intravenous drug use or sexual activity with multiple partners). The donor's travel history is screened to minimize risk of transmitting infections, such as malaria. The donated blood is tested for signs of infectious diseases, such as HIV and hepatitis. The blood is then tested to be sure it is compatible with you in order to minimize the chance of a transfusion reaction. If you or a relative donates blood, this is often done in anticipation of surgery and is not appropriate for emergency situations. It takes many days to process the donated blood. RISKS AND COMPLICATIONS Although transfusion therapy is very safe and saves many lives, the main dangers of transfusion include:   Getting an infectious disease.  Developing a transfusion reaction. This is an allergic reaction to something in the blood you were given. Every precaution is taken to prevent this. The decision to have a blood transfusion has been considered carefully by your caregiver before blood is given. Blood is not given unless the benefits outweigh the risks. AFTER THE TRANSFUSION  Right after receiving a blood transfusion, you will usually feel  much better and more energetic. This is especially true if your red blood cells have gotten low (anemic). The transfusion raises the level of the red blood cells which  carry oxygen, and this usually causes an energy increase.  The nurse administering the transfusion will monitor you carefully for complications. HOME CARE INSTRUCTIONS  No special instructions are needed after a transfusion. You may find your energy is better. Speak with your caregiver about any limitations on activity for underlying diseases you may have. SEEK MEDICAL CARE IF:   Your condition is not improving after your transfusion.  You develop redness or irritation at the intravenous (IV) site. SEEK IMMEDIATE MEDICAL CARE IF:  Any of the following symptoms occur over the next 12 hours:  Shaking chills.  You have a temperature by mouth above 102 F (38.9 C), not controlled by medicine.  Chest, back, or muscle pain.  People around you feel you are not acting correctly or are confused.  Shortness of breath or difficulty breathing.  Dizziness and fainting.  You get a rash or develop hives.  You have a decrease in urine output.  Your urine turns a dark color or changes to pink, red, or brown. Any of the following symptoms occur over the next 10 days:  You have a temperature by mouth above 102 F (38.9 C), not controlled by medicine.  Shortness of breath.  Weakness after normal activity.  The white part of the eye turns yellow (jaundice).  You have a decrease in the amount of urine or are urinating less often.  Your urine turns a dark color or changes to pink, red, or brown. Document Released: 10/27/2000 Document Revised: 01/22/2012 Document Reviewed: 06/15/2008 [facility] Patient Information 2014 [place], [place].  _______________________________________________________________________

## 2015-07-09 ENCOUNTER — Encounter (HOSPITAL_COMMUNITY)
Admission: RE | Admit: 2015-07-09 | Discharge: 2015-07-09 | Disposition: A | Discharge status: Home / Self Care | Payer: 59 | Source: Ambulatory Visit | Attending: Surgery | Admitting: Surgery

## 2015-07-09 ENCOUNTER — Encounter (HOSPITAL_COMMUNITY): Payer: Self-pay

## 2015-07-09 DIAGNOSIS — Z01818 Encounter for other preprocedural examination: Secondary | ICD-10-CM | POA: Diagnosis present

## 2015-07-09 LAB — CBC
HCT: 41.4 % (ref 39.0–52.0)
HEMOGLOBIN: 13.8 g/dL (ref 13.0–17.0)
MCH: 30.8 pg (ref 26.0–34.0)
MCHC: 33.3 g/dL (ref 30.0–36.0)
MCV: 92.4 fL (ref 78.0–100.0)
Platelets: 261 10*3/uL (ref 150–400)
RBC: 4.48 MIL/uL (ref 4.22–5.81)
RDW: 13.6 % (ref 11.5–15.5)
WBC: 7.2 10*3/uL (ref 4.0–10.5)

## 2015-07-09 NOTE — Progress Notes (Signed)
07-09-15 - No orders for surgery at preop appointment on 07-09-15

## 2015-07-12 NOTE — Progress Notes (Addendum)
07-12-15 - No orders for surgery on 07-23-15.  Progress note faxed  to Dr. Johnathan Hausen via [facility]

## 2015-07-13 ENCOUNTER — Ambulatory Visit: Payer: Self-pay | Admitting: Surgery

## 2015-07-22 NOTE — H&P (Signed)
[name]. [name] 05/20/2015 12:42 PM Location: [facility] Patient #: [id number] DOB: [birth year] Married / Language: English / Race: Black or African American Male  History of Present Illness Eric Key B. Hassell Done MD; 05/20/2015 1:14 PM) Patient words: f/u ileostomy  This 59 year old man wants to get ileostomy takedown. Peng had a small bowel resection for Crohn's in Dec 2001 and stayed in the hospital until March 2002 with multiple complications of this surgery requiring reops. He is complicated. Will try to get in in the left upper quadrant and then find a suitable spot to enter the abdomen and create a ileocolostomy.   I discussed this with him and his wife [name].      Allergies Jeralyn Ruths, [place]; 05/20/2015 12:44 PM) No Known Drug Allergies07/05/2015  Medication History Jeralyn Ruths, CMA; 05/20/2015 12:44 PM) Urocit-K 10 (1080MG  Tablet, Oral daily) Active. Medications Reconciled  Vitals Jearld Fenton Morris CMA; 05/20/2015 12:44 PM) 05/20/2015 12:44 PM Weight: 155.6 lb Height: 66in Body Surface Area: 1.81 m Body Mass Index: 25.11 kg/m Temp.: 98.83F(Oral)  Pulse: 88 (Regular)  Resp.: 20 (Unlabored)  BP: 110/68 (Sitting, Left Arm, Standard)    Physical Exam (Savon Cobbs B. 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

## 2015-07-23 ENCOUNTER — Encounter (HOSPITAL_COMMUNITY): Payer: Self-pay | Admitting: *Deleted

## 2015-07-23 ENCOUNTER — Encounter (HOSPITAL_COMMUNITY)
Admission: RE | Disposition: A | Discharge status: Home Health | Payer: Self-pay | Source: Ambulatory Visit | Attending: Surgery

## 2015-07-23 ENCOUNTER — Inpatient Hospital Stay (HOSPITAL_COMMUNITY): Payer: 59 | Admitting: Anesthesiology

## 2015-07-23 ENCOUNTER — Inpatient Hospital Stay (HOSPITAL_COMMUNITY)
Admission: RE | Admit: 2015-07-23 | Discharge: 2015-08-13 | DRG: 330 | Disposition: A | Discharge status: Home Health | Payer: 59 | Source: Ambulatory Visit | Attending: Surgery | Admitting: Surgery

## 2015-07-23 DIAGNOSIS — E8809 Other disorders of plasma-protein metabolism, not elsewhere classified: Secondary | ICD-10-CM | POA: Diagnosis not present

## 2015-07-23 DIAGNOSIS — K50914 Crohn's disease, unspecified, with abscess: Secondary | ICD-10-CM

## 2015-07-23 DIAGNOSIS — K660 Peritoneal adhesions (postprocedural) (postinfection): Secondary | ICD-10-CM | POA: Diagnosis present

## 2015-07-23 DIAGNOSIS — R739 Hyperglycemia, unspecified: Secondary | ICD-10-CM | POA: Diagnosis present

## 2015-07-23 DIAGNOSIS — K5090 Crohn's disease, unspecified, without complications: Secondary | ICD-10-CM | POA: Diagnosis present

## 2015-07-23 DIAGNOSIS — E876 Hypokalemia: Secondary | ICD-10-CM | POA: Diagnosis not present

## 2015-07-23 DIAGNOSIS — R000 Tachycardia, unspecified: Secondary | ICD-10-CM | POA: Diagnosis not present

## 2015-07-23 DIAGNOSIS — K439 Ventral hernia without obstruction or gangrene: Secondary | ICD-10-CM | POA: Diagnosis present

## 2015-07-23 DIAGNOSIS — D62 Acute posthemorrhagic anemia: Secondary | ICD-10-CM | POA: Diagnosis not present

## 2015-07-23 DIAGNOSIS — R066 Hiccough: Secondary | ICD-10-CM | POA: Diagnosis not present

## 2015-07-23 DIAGNOSIS — Z9049 Acquired absence of other specified parts of digestive tract: Secondary | ICD-10-CM | POA: Diagnosis present

## 2015-07-23 DIAGNOSIS — K9419 Other complications of enterostomy: Principal | ICD-10-CM | POA: Diagnosis present

## 2015-07-23 DIAGNOSIS — Z01812 Encounter for preprocedural laboratory examination: Secondary | ICD-10-CM

## 2015-07-23 DIAGNOSIS — K435 Parastomal hernia without obstruction or  gangrene: Secondary | ICD-10-CM | POA: Diagnosis present

## 2015-07-23 HISTORY — PX: INCISIONAL HERNIA REPAIR: SHX193

## 2015-07-23 HISTORY — PX: ILEOSTOMY CLOSURE: SHX1784

## 2015-07-23 HISTORY — DX: Crohn's disease, unspecified, with abscess: K50.914

## 2015-07-23 HISTORY — PX: LAPAROSCOPY: SHX197

## 2015-07-23 LAB — CBC
HCT: 36.6 % — ABNORMAL LOW (ref 39.0–52.0)
HEMOGLOBIN: 12 g/dL — AB (ref 13.0–17.0)
MCH: 30.7 pg (ref 26.0–34.0)
MCHC: 32.8 g/dL (ref 30.0–36.0)
MCV: 93.6 fL (ref 78.0–100.0)
PLATELETS: 250 10*3/uL (ref 150–400)
RBC: 3.91 MIL/uL — ABNORMAL LOW (ref 4.22–5.81)
RDW: 14.1 % (ref 11.5–15.5)
WBC: 11.4 10*3/uL — ABNORMAL HIGH (ref 4.0–10.5)

## 2015-07-23 LAB — CREATININE, SERUM
CREATININE: 1.8 mg/dL — AB (ref 0.61–1.24)
GFR calc Af Amer: 45 mL/min — ABNORMAL LOW (ref >60)
GFR, EST NON AFRICAN AMERICAN: 39 mL/min — AB (ref >60)

## 2015-07-23 LAB — MRSA PCR SCREENING: MRSA by PCR: NEGATIVE

## 2015-07-23 SURGERY — CLOSURE, ILEOSTOMY
Anesthesia: General

## 2015-07-23 MED ORDER — LIDOCAINE HCL (CARDIAC) 20 MG/ML IV SOLN
INTRAVENOUS | Status: DC | PRN
  Administered 2015-07-23: 50 mg via INTRAVENOUS

## 2015-07-23 MED ORDER — ESMOLOL HCL 10 MG/ML IV SOLN
INTRAVENOUS | Status: DC | PRN
  Administered 2015-07-23 (×2): 10 mg via INTRAVENOUS

## 2015-07-23 MED ORDER — DEXTROSE 5 % IV SOLN
2.0000 g | INTRAVENOUS | Status: AC
  Administered 2015-07-23: 2 g via INTRAVENOUS
  Filled 2015-07-23: qty 2

## 2015-07-23 MED ORDER — ALBUMIN HUMAN 5 % IV SOLN
INTRAVENOUS | Status: AC
  Filled 2015-07-23: qty 250

## 2015-07-23 MED ORDER — LIP MEDEX EX OINT
TOPICAL_OINTMENT | CUTANEOUS | Status: AC
  Filled 2015-07-23: qty 7

## 2015-07-23 MED ORDER — FENTANYL CITRATE (PF) 100 MCG/2ML IJ SOLN
INTRAMUSCULAR | Status: AC
  Filled 2015-07-23: qty 2

## 2015-07-23 MED ORDER — ACETAMINOPHEN 10 MG/ML IV SOLN
INTRAVENOUS | Status: AC
  Filled 2015-07-23: qty 100

## 2015-07-23 MED ORDER — PROPOFOL 10 MG/ML IV BOLUS
INTRAVENOUS | Status: DC | PRN
  Administered 2015-07-23: 140 mg via INTRAVENOUS

## 2015-07-23 MED ORDER — LACTATED RINGERS IR SOLN
Status: DC | PRN
  Administered 2015-07-23: 1000 mL

## 2015-07-23 MED ORDER — ONDANSETRON HCL 4 MG/2ML IJ SOLN
INTRAMUSCULAR | Status: DC | PRN
  Administered 2015-07-23: 4 mg via INTRAVENOUS

## 2015-07-23 MED ORDER — ALBUMIN HUMAN 5 % IV SOLN
INTRAVENOUS | Status: DC | PRN
  Administered 2015-07-23: 14:00:00 via INTRAVENOUS

## 2015-07-23 MED ORDER — METOPROLOL TARTRATE 1 MG/ML IV SOLN
INTRAVENOUS | Status: AC
Start: 2015-07-23 — End: 2015-07-23
  Filled 2015-07-23: qty 5

## 2015-07-23 MED ORDER — MIDAZOLAM HCL 2 MG/2ML IJ SOLN
INTRAMUSCULAR | Status: AC
  Filled 2015-07-23: qty 4

## 2015-07-23 MED ORDER — CHLORHEXIDINE GLUCONATE CLOTH 2 % EX PADS: 6.0000 | MEDICATED_PAD | Freq: Once | CUTANEOUS | Status: DC

## 2015-07-23 MED ORDER — HYDROMORPHONE HCL 1 MG/ML IJ SOLN
0.2500 mg | INTRAMUSCULAR | Status: DC | PRN
  Administered 2015-07-23 (×4): 0.5 mg via INTRAVENOUS

## 2015-07-23 MED ORDER — HYDROMORPHONE HCL 1 MG/ML IJ SOLN
INTRAMUSCULAR | Status: AC
  Filled 2015-07-23: qty 1

## 2015-07-23 MED ORDER — BUPIVACAINE LIPOSOME 1.3 % IJ SUSP
20.0000 mL | Freq: Once | INTRAMUSCULAR | Status: DC
  Filled 2015-07-23: qty 20

## 2015-07-23 MED ORDER — BACITRACIN-NEOMYCIN-POLYMYXIN OINTMENT TUBE
TOPICAL_OINTMENT | CUTANEOUS | Status: DC | PRN
  Administered 2015-07-23: 1 via TOPICAL

## 2015-07-23 MED ORDER — LACTATED RINGERS IV SOLN
INTRAVENOUS | Status: DC
  Administered 2015-07-23 (×2): via INTRAVENOUS
  Administered 2015-07-23: 1000 mL via INTRAVENOUS
  Administered 2015-07-23: 11:00:00 via INTRAVENOUS

## 2015-07-23 MED ORDER — HEPARIN SODIUM (PORCINE) 5000 UNIT/ML IJ SOLN
5000.0000 [IU] | Freq: Three times a day (TID) | INTRAMUSCULAR | Status: DC
  Administered 2015-07-23 – 2015-08-13 (×63): 5000 [IU] via SUBCUTANEOUS
  Filled 2015-07-23 (×68): qty 1

## 2015-07-23 MED ORDER — ONDANSETRON HCL 4 MG/2ML IJ SOLN: 4.0000 mg | Freq: Four times a day (QID) | INTRAMUSCULAR | Status: DC | PRN

## 2015-07-23 MED ORDER — OXYCODONE HCL 5 MG PO TABS: 5.0000 mg | ORAL_TABLET | Freq: Once | ORAL | Status: DC | PRN

## 2015-07-23 MED ORDER — ACETAMINOPHEN 10 MG/ML IV SOLN
1000.0000 mg | Freq: Once | INTRAVENOUS | Status: AC
  Administered 2015-07-23: 1000 mg via INTRAVENOUS

## 2015-07-23 MED ORDER — ATROPINE SULFATE 0.4 MG/ML IJ SOLN
INTRAMUSCULAR | Status: AC
  Filled 2015-07-23: qty 2

## 2015-07-23 MED ORDER — ONDANSETRON HCL 4 MG/2ML IJ SOLN
4.0000 mg | Freq: Four times a day (QID) | INTRAMUSCULAR | Status: DC | PRN
  Administered 2015-07-23 – 2015-07-24 (×2): 4 mg via INTRAVENOUS
  Filled 2015-07-23 (×3): qty 2

## 2015-07-23 MED ORDER — HYDROMORPHONE HCL 2 MG/ML IJ SOLN
INTRAMUSCULAR | Status: AC
  Filled 2015-07-23: qty 1

## 2015-07-23 MED ORDER — EPHEDRINE SULFATE 50 MG/ML IJ SOLN
INTRAMUSCULAR | Status: AC
  Filled 2015-07-23: qty 1

## 2015-07-23 MED ORDER — MORPHINE SULFATE (PF) 10 MG/ML IV SOLN
1.0000 mg | INTRAVENOUS | Status: DC | PRN
  Administered 2015-07-23 – 2015-07-24 (×6): 1 mg via INTRAVENOUS
  Filled 2015-07-23 (×6): qty 1

## 2015-07-23 MED ORDER — SUGAMMADEX SODIUM 200 MG/2ML IV SOLN
INTRAVENOUS | Status: DC | PRN
  Administered 2015-07-23: 139.8 mg via INTRAVENOUS

## 2015-07-23 MED ORDER — MIDAZOLAM HCL 5 MG/5ML IJ SOLN
INTRAMUSCULAR | Status: DC | PRN
  Administered 2015-07-23: 2 mg via INTRAVENOUS

## 2015-07-23 MED ORDER — LIDOCAINE HCL (CARDIAC) 20 MG/ML IV SOLN
INTRAVENOUS | Status: AC
  Filled 2015-07-23: qty 5

## 2015-07-23 MED ORDER — ONDANSETRON HCL 4 MG PO TABS
4.0000 mg | ORAL_TABLET | Freq: Four times a day (QID) | ORAL | Status: DC | PRN
  Administered 2015-08-11: 4 mg via ORAL
  Filled 2015-07-23 (×2): qty 1

## 2015-07-23 MED ORDER — KCL IN DEXTROSE-NACL 20-5-0.45 MEQ/L-%-% IV SOLN
INTRAVENOUS | Status: AC
  Filled 2015-07-23: qty 1000

## 2015-07-23 MED ORDER — HEPARIN SODIUM (PORCINE) 5000 UNIT/ML IJ SOLN
5000.0000 [IU] | Freq: Once | INTRAMUSCULAR | Status: AC
  Administered 2015-07-23: 5000 [IU] via SUBCUTANEOUS
  Filled 2015-07-23: qty 1

## 2015-07-23 MED ORDER — LIP MEDEX EX OINT
TOPICAL_OINTMENT | CUTANEOUS | Status: AC
  Administered 2015-07-23: 20:00:00
  Filled 2015-07-23: qty 7

## 2015-07-23 MED ORDER — BACITRACIN-NEOMYCIN-POLYMYXIN 400-5-5000 EX OINT
TOPICAL_OINTMENT | CUTANEOUS | Status: AC
  Filled 2015-07-23: qty 1

## 2015-07-23 MED ORDER — FENTANYL CITRATE (PF) 250 MCG/5ML IJ SOLN
INTRAMUSCULAR | Status: AC
  Filled 2015-07-23: qty 25

## 2015-07-23 MED ORDER — ONDANSETRON HCL 4 MG/2ML IJ SOLN
INTRAMUSCULAR | Status: AC
  Filled 2015-07-23: qty 2

## 2015-07-23 MED ORDER — HYDROMORPHONE HCL 1 MG/ML IJ SOLN
INTRAMUSCULAR | Status: DC | PRN
  Administered 2015-07-23: 1 mg via INTRAVENOUS
  Administered 2015-07-23 (×2): 0.5 mg via INTRAVENOUS

## 2015-07-23 MED ORDER — DEXTROSE 5 % IV SOLN
2.0000 g | Freq: Two times a day (BID) | INTRAVENOUS | Status: AC
  Administered 2015-07-23: 2 g via INTRAVENOUS
  Filled 2015-07-23: qty 2

## 2015-07-23 MED ORDER — PHENYLEPHRINE HCL 10 MG/ML IJ SOLN
INTRAMUSCULAR | Status: DC | PRN
  Administered 2015-07-23 (×4): 80 ug via INTRAVENOUS

## 2015-07-23 MED ORDER — ROCURONIUM BROMIDE 100 MG/10ML IV SOLN
INTRAVENOUS | Status: DC | PRN
  Administered 2015-07-23: 20 mg via INTRAVENOUS
  Administered 2015-07-23: 50 mg via INTRAVENOUS
  Administered 2015-07-23 (×2): 10 mg via INTRAVENOUS

## 2015-07-23 MED ORDER — 0.9 % SODIUM CHLORIDE (POUR BTL) OPTIME
TOPICAL | Status: DC | PRN
  Administered 2015-07-23: 5000 mL

## 2015-07-23 MED ORDER — KCL IN DEXTROSE-NACL 20-5-0.45 MEQ/L-%-% IV SOLN
INTRAVENOUS | Status: DC
  Administered 2015-07-23 – 2015-07-24 (×3): via INTRAVENOUS
  Administered 2015-07-25: 100 mL via INTRAVENOUS
  Administered 2015-07-26: 11:00:00 via INTRAVENOUS
  Administered 2015-07-26: 100 mL via INTRAVENOUS
  Administered 2015-07-27 – 2015-08-13 (×21): via INTRAVENOUS
  Filled 2015-07-23 (×40): qty 1000

## 2015-07-23 MED ORDER — METOPROLOL TARTRATE 1 MG/ML IV SOLN
INTRAVENOUS | Status: DC | PRN
  Administered 2015-07-23: 1 mg via INTRAVENOUS

## 2015-07-23 MED ORDER — HYDROMORPHONE HCL 1 MG/ML IJ SOLN
INTRAMUSCULAR | Status: AC
Start: 2015-07-23 — End: 2015-07-24
  Filled 2015-07-23: qty 1

## 2015-07-23 MED ORDER — OXYCODONE HCL 5 MG/5ML PO SOLN
5.0000 mg | Freq: Once | ORAL | Status: DC | PRN
  Filled 2015-07-23: qty 5

## 2015-07-23 MED ORDER — FENTANYL CITRATE (PF) 100 MCG/2ML IJ SOLN
50.0000 ug | INTRAMUSCULAR | Status: AC | PRN
  Administered 2015-07-23: 50 ug via INTRAVENOUS
  Administered 2015-07-23: 100 ug via INTRAVENOUS
  Administered 2015-07-23 (×4): 50 ug via INTRAVENOUS

## 2015-07-23 MED ORDER — SUGAMMADEX SODIUM 200 MG/2ML IV SOLN
INTRAVENOUS | Status: AC
Start: 2015-07-23 — End: 2015-07-23
  Filled 2015-07-23: qty 2

## 2015-07-23 SURGICAL SUPPLY — 78 items
APL SKNCLS STERI-STRIP NONHPOA (GAUZE/BANDAGES/DRESSINGS)
BENZOIN TINCTURE PRP APPL 2/3 (GAUZE/BANDAGES/DRESSINGS) IMPLANT
BINDER ABDOMINAL 12 ML 46-62 (SOFTGOODS) ×2 IMPLANT
BLADE EXTENDED COATED 6.5IN (ELECTRODE) ×2 IMPLANT
BLADE HEX COATED 2.75 (ELECTRODE) ×3 IMPLANT
CLOSURE WOUND 1/2 X4 (GAUZE/BANDAGES/DRESSINGS)
COVER MAYO STAND STRL (DRAPES) ×5 IMPLANT
COVER SURGICAL LIGHT HANDLE (MISCELLANEOUS) ×6 IMPLANT
DECANTER SPIKE VIAL GLASS SM (MISCELLANEOUS) IMPLANT
DEVICE TROCAR PUNCTURE CLOSURE (ENDOMECHANICALS) ×2 IMPLANT
DRAPE LAPAROSCOPIC ABDOMINAL (DRAPES) ×3 IMPLANT
DRAPE SHEET LG 3/4 BI-LAMINATE (DRAPES) IMPLANT
DRAPE UTILITY XL STRL (DRAPES) ×2 IMPLANT
DRAPE WARM FLUID 44X44 (DRAPE) ×3 IMPLANT
DRSG OPSITE POSTOP 4X10 (GAUZE/BANDAGES/DRESSINGS) IMPLANT
DRSG OPSITE POSTOP 4X6 (GAUZE/BANDAGES/DRESSINGS) IMPLANT
DRSG OPSITE POSTOP 4X8 (GAUZE/BANDAGES/DRESSINGS) IMPLANT
DRSG PAD ABDOMINAL 8X10 ST (GAUZE/BANDAGES/DRESSINGS) ×5 IMPLANT
ELECT REM PT RETURN 9FT ADLT (ELECTROSURGICAL) ×3
ELECTRODE REM PT RTRN 9FT ADLT (ELECTROSURGICAL) ×1 IMPLANT
GAUZE SPONGE 4X4 12PLY STRL (GAUZE/BANDAGES/DRESSINGS) ×3 IMPLANT
GLOVE BIOGEL M 8.0 STRL (GLOVE) ×6 IMPLANT
GLOVE BIOGEL PI IND STRL 7.0 (GLOVE) ×1 IMPLANT
GLOVE BIOGEL PI INDICATOR 7.0 (GLOVE) ×4
GOWN SPEC L4 XLG W/TWL (GOWN DISPOSABLE) ×1 IMPLANT
GOWN STRL REUS W/TWL LRG LVL3 (GOWN DISPOSABLE) ×5 IMPLANT
GOWN STRL REUS W/TWL XL LVL3 (GOWN DISPOSABLE) ×19 IMPLANT
HANDLE STAPLE EGIA 4 XL (STAPLE) ×2 IMPLANT
HOLDER FOLEY CATH W/STRAP (MISCELLANEOUS) ×2 IMPLANT
KIT BASIN OR (CUSTOM PROCEDURE TRAY) ×3 IMPLANT
PACK COLON (CUSTOM PROCEDURE TRAY) ×2 IMPLANT
PACK GENERAL/GYN (CUSTOM PROCEDURE TRAY) ×1 IMPLANT
PAD ABD 8X10 STRL (GAUZE/BANDAGES/DRESSINGS) ×2 IMPLANT
PEN SKIN MARKING BROAD (MISCELLANEOUS) ×3 IMPLANT
RELOAD EGIA 60 MED/THCK PURPLE (STAPLE) ×3 IMPLANT
RELOAD PROXIMATE 75MM BLUE (ENDOMECHANICALS) ×3 IMPLANT
RELOAD STAPLE 60 MED/THCK ART (STAPLE) IMPLANT
RELOAD STAPLE 75 3.8 BLU REG (ENDOMECHANICALS) IMPLANT
SCRUB PCMX 4 OZ (MISCELLANEOUS) ×3 IMPLANT
SET IRRIG TUBING LAPAROSCOPIC (IRRIGATION / IRRIGATOR) ×2 IMPLANT
SHEARS HARMONIC ACE PLUS 36CM (ENDOMECHANICALS) ×2 IMPLANT
SLEEVE Z-THREAD 5X100MM (TROCAR) IMPLANT
SOLUTION ANTI FOG 6CC (MISCELLANEOUS) ×3 IMPLANT
SPONGE LAP 18X18 X RAY DECT (DISPOSABLE) ×8 IMPLANT
STAPLER PROXIMATE 75MM BLUE (STAPLE) ×2 IMPLANT
STAPLER VISISTAT 35W (STAPLE) ×3 IMPLANT
STRIP CLOSURE SKIN 1/2X4 (GAUZE/BANDAGES/DRESSINGS) IMPLANT
SUCTION POOLE TIP (SUCTIONS) ×1 IMPLANT
SUT ETHILON 4 0 PS 2 18 (SUTURE) ×6 IMPLANT
SUT NOVA 1 T20/GS 25DT (SUTURE) ×8 IMPLANT
SUT NOVA NAB DX-16 0-1 5-0 T12 (SUTURE) IMPLANT
SUT NOVA NAB GS-21 0 18 T12 DT (SUTURE) IMPLANT
SUT PDS AB 1 CTX 36 (SUTURE) IMPLANT
SUT PDS AB 4-0 SH 27 (SUTURE) ×4 IMPLANT
SUT PROLENE 0 CT 1 CR/8 (SUTURE) IMPLANT
SUT SILK 2 0 (SUTURE) ×3
SUT SILK 2 0 SH CR/8 (SUTURE) ×3 IMPLANT
SUT SILK 2-0 18XBRD TIE 12 (SUTURE) ×1 IMPLANT
SUT SILK 3 0 (SUTURE) ×3
SUT SILK 3 0 SH CR/8 (SUTURE) ×5 IMPLANT
SUT SILK 3-0 18XBRD TIE 12 (SUTURE) ×1 IMPLANT
SUT VIC AB 2-0 CT2 27 (SUTURE) IMPLANT
SUT VIC AB 2-0 SH 18 (SUTURE) ×2 IMPLANT
SUT VIC AB 4-0 SH 18 (SUTURE) IMPLANT
SYR 30ML LL (SYRINGE) ×1 IMPLANT
TAPE CLOTH SURG 6X10 WHT LF (GAUZE/BANDAGES/DRESSINGS) ×2 IMPLANT
TISSUE MATRIX STRATTICE 20X30 (Mesh General) ×2 IMPLANT
TOWEL OR 17X26 10 PK STRL BLUE (TOWEL DISPOSABLE) ×2 IMPLANT
TOWEL OR NON WOVEN STRL DISP B (DISPOSABLE) ×5 IMPLANT
TRAY FOLEY W/METER SILVER 14FR (SET/KITS/TRAYS/PACK) ×1 IMPLANT
TRAY FOLEY W/METER SILVER 16FR (SET/KITS/TRAYS/PACK) ×3 IMPLANT
TRAY LAPAROSCOPIC (CUSTOM PROCEDURE TRAY) ×3 IMPLANT
TROCAR BLADELESS OPT 5 100 (ENDOMECHANICALS) ×2 IMPLANT
TROCAR XCEL NON-BLD 11X100MML (ENDOMECHANICALS) IMPLANT
TROCAR XCEL UNIV SLVE 11M 100M (ENDOMECHANICALS) IMPLANT
TUBING INSUFFLATION 10FT LAP (TUBING) ×3 IMPLANT
YANKAUER SUCT BULB TIP 10FT TU (MISCELLANEOUS) ×2 IMPLANT
YANKAUER SUCT BULB TIP NO VENT (SUCTIONS) ×5 IMPLANT

## 2015-07-23 NOTE — Interval H&P Note (Signed)
History and Physical Interval Note:  07/23/2015 9:46 AM  [name]  has presented today for surgery, with the diagnosis of prolapse ileostomy  The various methods of treatment have been discussed with the patient and family. After consideration of risks, benefits and other options for treatment, the patient has consented to  Procedure(s): ILEOSTOMY TAKEDOWN (N/A) LAPAROSCOPY DIAGNOSTIC (N/A) as a surgical intervention .  The patient's history has been reviewed, patient examined, no change in status, stable for surgery.  I have reviewed the patient's chart and labs. I have explained that our goal is ileostomy closure but if unable to do that will try to repair ileostomy hernia.   Questions were answered to the patient's satisfaction.     Rufino Staup B

## 2015-07-23 NOTE — Anesthesia Procedure Notes (Signed)
Procedure Name: Intubation Performed by: [name] Pre-anesthesia Checklist: Patient identified, Emergency Drugs available, Suction available, Patient being monitored and Timeout performed Patient Re-evaluated:Patient Re-evaluated prior to inductionOxygen Delivery Method: Circle system utilized Preoxygenation: Pre-oxygenation with 100% oxygen Intubation Type: IV induction Ventilation: Mask ventilation without difficulty Laryngoscope Size: Mac and 3 Grade View: Grade I Tube type: Oral Tube size: 7.5 mm Number of attempts: 1 Airway Equipment and Method: Stylet Placement Confirmation: ETT inserted through vocal cords under direct vision,  positive ETCO2,  CO2 detector and breath sounds checked- equal and bilateral Secured at: 23 cm Dental Injury: Teeth and Oropharynx as per pre-operative assessment

## 2015-07-23 NOTE — Anesthesia Preprocedure Evaluation (Signed)
Anesthesia Evaluation  Patient identified by MRN, date of birth, ID band Patient awake    Reviewed: Allergy & Precautions, NPO status , Patient's Chart, lab work & pertinent test results  Airway Mallampati: II   Neck ROM: full    Dental   Pulmonary former smoker,    breath sounds clear to auscultation       Cardiovascular negative cardio ROS   Rhythm:regular Rate:Normal     Neuro/Psych    GI/Hepatic Crohn's disease.  Pt has ileostomy s/p ex-lap.   Endo/Other    Renal/GU      Musculoskeletal   Abdominal   Peds  Hematology   Anesthesia Other Findings   Reproductive/Obstetrics                             Anesthesia Physical Anesthesia Plan  ASA: II  Anesthesia Plan: General   Post-op Pain Management:    Induction: Intravenous  Airway Management Planned: Oral ETT  Additional Equipment:   Intra-op Plan:   Post-operative Plan: Extubation in OR  Informed Consent: I have reviewed the patients History and Physical, chart, labs and discussed the procedure including the risks, benefits and alternatives for the proposed anesthesia with the patient or authorized representative who has indicated his/her understanding and acceptance.     Plan Discussed with: CRNA, Anesthesiologist and Surgeon  Anesthesia Plan Comments:         Anesthesia Quick Evaluation

## 2015-07-23 NOTE — Progress Notes (Signed)
Dr. Marcie Bal aware of continued HR 110-115 with BP 115/80. Urine output 31ml dar yellow color. No new orders. Will monitor.

## 2015-07-23 NOTE — Anesthesia Postprocedure Evaluation (Signed)
Anesthesia Post Note  Patient: [name]  Procedure(s) Performed: Procedure(s) (LRB): Takedown ileostomy and repair of ostomy hernia, extensive entrolysis (2.5 hrs), ileostransverse colon anastomosis; closure of massive ventral hernia with 20 x 30 Strattice mesh (N/A) LAPAROSCOPY DIAGNOSTIC (N/A)  Anesthesia type: General  Patient location: PACU  Post pain: Pain level controlled and Adequate analgesia  Post assessment: Post-op Vital signs reviewed, Patient's Cardiovascular Status Stable, Respiratory Function Stable, Patent Airway and Pain level controlled  Last Vitals:  Filed Vitals:   07/23/15 1545  BP: 122/77  Pulse: 110  Temp:   Resp: 18    Post vital signs: Reviewed and stable  Level of consciousness: awake, alert  and oriented  Complications: No apparent anesthesia complications

## 2015-07-23 NOTE — Op Note (Signed)
Surgeon: Kaylyn Lim, MD, FACS  Asst:  Alphonsa Overall, MD, FACS and Sharin Grave, RNFA  Anes:  General   Procedure: Attempted laparoscopy, laparotomy with extensive enterolysis (2.5 hours) and takedown of paraileostomy hernia, creation of distal small bowel to transverse colon anastomosis, repair of ventral hernia with 20 x 30 cm Strattice mesh  Diagnosis: Crohn's disease and prior ileostomy after open abdomen and secondary closure  Complications: None noted  EBL:   75 cc  Drains: none  Description of Procedure:  The patient was taken to OR 4 at [facility].  After anesthesia was administered and the patient was prepped a timeout was performed.  The operation began with attempted laparoscopy. A site in the left upper quadrant was chosen and the 5 mm Optiview was passed into the abdomen. Insufflation revealed this to be in a area of adhesions and although I did not encounter bowel was unable to adequately insufflate the abdominal cavity.  , Then went ahead and made in the upper midline and made an incision in a fresh area entering the abdomen without difficulty. I was unable to use my finger then began taking a tremendous amount of abdominal occasions down. The patient's prior laparotomy back in 2001 which resulted in I will open wound and secondary granulation over the hernia wasn't entered in. Careful sharp they'll sharp bowel adhesiolyse is performed with scissors. No enterotomies were created. This went on for approximately 2-1/2 hours to completely free the anterior abdominal wall and the scar and also to mobilize the ileostomy. When the ileostomy had been immobilized with an cut down the skin and brought it into the abdomen where stapled off the tip.  We then worked and found the transverse colon. He was stuck down in the right gutter and I had to chisel it out. I did create a colotomy at that point this was in a defunctionalized area. There was 60 year old stool or in the transverse colon which we  manually removed.  I then irrigated with the transverse colon with a single bulb of Betadine liquid. I then stapled under these 2 out of Meis. We then created an ileotransverse anastomosis and for this I used the [employer] stapler 6 cm using a purple cartridge. Common channel was made and then the opening was closed in 2 layers with 4-0 PDS #3-0 silk. We then irrigated with several liters of saline and then we changed our gown and gloves and followed the colon protocol.  I selected a piece of Stratus 20 x 30 cm mesh oriented transversely with the long axis. 4 sutures of 0 Novafil were placed in this and the inner clot and wrote Endo Close used to pull this up and anchored in these 4 locations. The fascia above was closed with interrupted #1 Novafils for about 4 inches. The remaining skin which was a previous full-thickness scar was approximated with vertical mattress sutures of  4-0 nylon and also with staples.     The patient tolerated the procedure well and was taken to the PACU in stable condition.     [name] Done, [place], [facility], [place]

## 2015-07-23 NOTE — Transfer of Care (Signed)
Immediate Anesthesia Transfer of Care Note  Patient: [name]  Procedure(s) Performed: Procedure(s): Takedown ileostomy and repair of ostomy hernia, extensive entrolysis (2.5 hrs), ileostransverse colon anastomosis; closure of massive ventral hernia with 20 x 30 Strattice mesh (N/A) LAPAROSCOPY DIAGNOSTIC (N/A)  Patient Location: PACU  Anesthesia Type:General  Level of Consciousness:  sedated, patient cooperative and responds to stimulation  Airway & Oxygen Therapy:Patient Spontanous Breathing and Patient connected to face mask oxgen  Post-op Assessment:  Report given to PACU RN and Post -op Vital signs reviewed and stable  Post vital signs:  Reviewed and stable  Last Vitals:  Filed Vitals:   07/23/15 1517  BP: 129/79  Pulse: 109  Temp:   Resp: 21    Complications: No apparent anesthesia complications

## 2015-07-24 LAB — GLUCOSE, CAPILLARY
GLUCOSE-CAPILLARY: 276 mg/dL — AB (ref 65–99)
Glucose-Capillary: 201 mg/dL — ABNORMAL HIGH (ref 65–99)
Glucose-Capillary: 165 mg/dL — ABNORMAL HIGH (ref 65–99)
Glucose-Capillary: 197 mg/dL — ABNORMAL HIGH (ref 65–99)

## 2015-07-24 LAB — CBC
HCT: 36.1 % — ABNORMAL LOW (ref 39.0–52.0)
Hemoglobin: 12 g/dL — ABNORMAL LOW (ref 13.0–17.0)
MCH: 30.7 pg (ref 26.0–34.0)
MCHC: 33.2 g/dL (ref 30.0–36.0)
MCV: 92.3 fL (ref 78.0–100.0)
PLATELETS: 265 10*3/uL (ref 150–400)
RBC: 3.91 MIL/uL — ABNORMAL LOW (ref 4.22–5.81)
RDW: 14.1 % (ref 11.5–15.5)
WBC: 12.4 10*3/uL — AB (ref 4.0–10.5)

## 2015-07-24 LAB — BASIC METABOLIC PANEL
Anion gap: 11 (ref 5–15)
BUN: 25 mg/dL — AB (ref 6–20)
CALCIUM: 7.2 mg/dL — AB (ref 8.9–10.3)
CO2: 19 mmol/L — ABNORMAL LOW (ref 22–32)
CREATININE: 1.65 mg/dL — AB (ref 0.61–1.24)
Chloride: 103 mmol/L (ref 101–111)
GFR calc Af Amer: 51 mL/min — ABNORMAL LOW (ref >60)
GFR, EST NON AFRICAN AMERICAN: 44 mL/min — AB (ref >60)
Glucose, Bld: 336 mg/dL — ABNORMAL HIGH (ref 65–99)
POTASSIUM: 4.5 mmol/L (ref 3.5–5.1)
SODIUM: 133 mmol/L — AB (ref 135–145)

## 2015-07-24 LAB — HEMOGLOBIN A1C
HEMOGLOBIN A1C: 8.5 % — AB (ref 4.8–5.6)
Mean Plasma Glucose: 197 mg/dL

## 2015-07-24 MED ORDER — INSULIN ASPART 100 UNIT/ML ~~LOC~~ SOLN
0.0000 [IU] | SUBCUTANEOUS | Status: DC
  Administered 2015-07-24: 5 [IU] via SUBCUTANEOUS
  Administered 2015-07-24 (×2): 3 [IU] via SUBCUTANEOUS
  Administered 2015-07-24: 8 [IU] via SUBCUTANEOUS
  Administered 2015-07-25: 5 [IU] via SUBCUTANEOUS
  Administered 2015-07-25 (×4): 3 [IU] via SUBCUTANEOUS
  Administered 2015-07-25: 2 [IU] via SUBCUTANEOUS
  Administered 2015-07-25: 5 [IU] via SUBCUTANEOUS
  Administered 2015-07-26 (×4): 2 [IU] via SUBCUTANEOUS
  Administered 2015-07-26: 3 [IU] via SUBCUTANEOUS
  Administered 2015-07-26 – 2015-07-28 (×7): 2 [IU] via SUBCUTANEOUS
  Administered 2015-07-28: 3 [IU] via SUBCUTANEOUS
  Administered 2015-07-28: 5 [IU] via SUBCUTANEOUS
  Administered 2015-07-29: 2 [IU] via SUBCUTANEOUS
  Administered 2015-07-29: 3 [IU] via SUBCUTANEOUS
  Administered 2015-07-29 (×2): 2 [IU] via SUBCUTANEOUS

## 2015-07-24 MED ORDER — SODIUM CHLORIDE 0.9 % IV SOLN
12.5000 mg | Freq: Four times a day (QID) | INTRAVENOUS | Status: AC | PRN
  Administered 2015-07-24 – 2015-07-25 (×5): 12.5 mg via INTRAVENOUS
  Filled 2015-07-24 (×9): qty 0.5

## 2015-07-24 MED ORDER — SODIUM CHLORIDE 0.9 % IV BOLUS (SEPSIS): 1000.0000 mL | Freq: Once | INTRAVENOUS | Status: DC

## 2015-07-24 MED ORDER — LORAZEPAM 2 MG/ML IJ SOLN
INTRAMUSCULAR | Status: AC
  Filled 2015-07-24: qty 1

## 2015-07-24 MED ORDER — LORAZEPAM 2 MG/ML IJ SOLN
1.0000 mg | Freq: Once | INTRAMUSCULAR | Status: AC
  Administered 2015-07-24: 1 mg via INTRAVENOUS

## 2015-07-24 MED ORDER — METOPROLOL TARTRATE 1 MG/ML IV SOLN
2.5000 mg | Freq: Four times a day (QID) | INTRAVENOUS | Status: DC | PRN
  Administered 2015-07-24 – 2015-07-25 (×3): 2.5 mg via INTRAVENOUS
  Filled 2015-07-24 (×3): qty 5

## 2015-07-24 MED ORDER — MORPHINE SULFATE 1 MG/ML IV SOLN
INTRAVENOUS | Status: DC
  Administered 2015-07-24: 9 mg via INTRAVENOUS
  Administered 2015-07-24: 08:00:00 via INTRAVENOUS
  Administered 2015-07-24: 15 mg via INTRAVENOUS
  Administered 2015-07-25: 7.5 mg via INTRAVENOUS
  Administered 2015-07-25: 05:00:00 via INTRAVENOUS
  Administered 2015-07-25: 1.5 mg via INTRAVENOUS
  Administered 2015-07-25: 4.5 mg via INTRAVENOUS
  Administered 2015-07-25: 9 mg via INTRAVENOUS
  Administered 2015-07-26: 1.5 mg via INTRAVENOUS
  Administered 2015-07-26: 22:00:00 via INTRAVENOUS
  Administered 2015-07-26: 7.5 mg via INTRAVENOUS
  Administered 2015-07-26: 6 mg via INTRAVENOUS
  Administered 2015-07-26: 11:00:00 via INTRAVENOUS
  Administered 2015-07-27: 4.36 mg via INTRAVENOUS
  Administered 2015-07-27: 6.4 mg via INTRAVENOUS
  Administered 2015-07-27: 14:00:00 via INTRAVENOUS
  Administered 2015-07-28: 6 mg via INTRAVENOUS
  Administered 2015-07-28: 2.39 mg via INTRAVENOUS
  Administered 2015-07-28: 7.5 mg via INTRAVENOUS
  Administered 2015-07-28: 4.5 mg via INTRAVENOUS
  Administered 2015-07-28: 07:00:00 via INTRAVENOUS
  Administered 2015-07-28: 4.75 mg via INTRAVENOUS
  Administered 2015-07-29: 9 mg via INTRAVENOUS
  Administered 2015-07-29: 6 mg via INTRAVENOUS
  Administered 2015-07-29: 4.5 mg via INTRAVENOUS
  Administered 2015-07-29: 6 mg via INTRAVENOUS
  Administered 2015-07-29: 02:00:00 via INTRAVENOUS
  Administered 2015-07-29: 10.5 mg via INTRAVENOUS
  Administered 2015-07-30: 14:00:00 via INTRAVENOUS
  Administered 2015-07-30: 5.82 mg via INTRAVENOUS
  Administered 2015-07-30: 4.5 mg via INTRAVENOUS
  Administered 2015-07-31: 9 mg via INTRAVENOUS
  Administered 2015-07-31 (×2): via INTRAVENOUS
  Administered 2015-07-31: 9 mg via INTRAVENOUS
  Administered 2015-07-31: 7.5 mg via INTRAVENOUS
  Administered 2015-07-31: 5.66 mg via INTRAVENOUS
  Administered 2015-07-31: 13.9 mg via INTRAVENOUS
  Administered 2015-08-01: 12 mg via INTRAVENOUS
  Administered 2015-08-01: 02:00:00 via INTRAVENOUS
  Administered 2015-08-01: 8.76 mg via INTRAVENOUS
  Administered 2015-08-01: 8.39 mg via INTRAVENOUS
  Filled 2015-07-24 (×15): qty 25

## 2015-07-24 MED ORDER — DIPHENHYDRAMINE HCL 12.5 MG/5ML PO ELIX: 12.5000 mg | ORAL_SOLUTION | Freq: Four times a day (QID) | ORAL | Status: DC | PRN

## 2015-07-24 MED ORDER — SODIUM CHLORIDE 0.9 % IJ SOLN: 9.0000 mL | INTRAMUSCULAR | Status: DC | PRN

## 2015-07-24 MED ORDER — DIPHENHYDRAMINE HCL 50 MG/ML IJ SOLN: 12.5000 mg | Freq: Four times a day (QID) | INTRAMUSCULAR | Status: DC | PRN

## 2015-07-24 MED ORDER — NALOXONE HCL 0.4 MG/ML IJ SOLN: 0.4000 mg | INTRAMUSCULAR | Status: DC | PRN

## 2015-07-24 MED ORDER — ONDANSETRON HCL 4 MG/2ML IJ SOLN: 4.0000 mg | Freq: Four times a day (QID) | INTRAMUSCULAR | Status: DC | PRN

## 2015-07-24 NOTE — Progress Notes (Signed)
1 Day Post-Op Ex lap, take down of ileostomy, hernia repair Subjective: Pt having pain issues and hiccups overnight.  Blood glucose levels elevated  Objective: Vital signs in last 24 hours: Temp:  [97.8 F (36.6 C)-98.8 F (37.1 C)] 98.8 F (37.1 C) (09/10 0400) Pulse Rate:  [109-126] 123 (09/10 0600) Resp:  [9-28] 23 (09/10 0824) BP: (106-134)/(69-84) 123/77 mmHg (09/10 0600) SpO2:  [95 %-100 %] 100 % (09/10 0824) FiO2 (%):  [100 %] 100 % (09/10 0824) Weight:  [69 kg (152 lb 1.9 oz)-71.4 kg (157 lb 6.5 oz)] 71.4 kg (157 lb 6.5 oz) (09/10 0400)   Intake/Output from previous day: 09/09 0701 - 09/10 0700 In: 6650 [I.V.:6300; IV Piggyback:350] Out: 1110 [Urine:910; Blood:200] Intake/Output this shift:     General appearance: alert and cooperative GI: normal findings: soft, appropriately tender  Incision: dressing intact with some serous output  Lab Results:   Recent Labs  07/23/15 2025 07/24/15 0405  WBC 11.4* 12.4*  HGB 12.0* 12.0*  HCT 36.6* 36.1*  PLT 250 265   BMET  Recent Labs  07/23/15 2025 07/24/15 0405  NA  --  133*  K  --  4.5  CL  --  103  CO2  --  19*  GLUCOSE  --  336*  BUN  --  25*  CREATININE 1.80* 1.65*  CALCIUM  --  7.2*   PT/INR No results for input(s): LABPROT, INR in the last 72 hours. ABG No results for input(s): PHART, HCO3 in the last 72 hours.  Invalid input(s): PCO2, PO2  MEDS, Scheduled . heparin subcutaneous  5,000 Units Subcutaneous 3 times per day  . insulin aspart  0-15 Units Subcutaneous 6 times per day  . morphine   Intravenous 6 times per day    Studies/Results: No results found.  Assessment: s/p Procedure(s): Takedown ileostomy and repair of ostomy hernia, extensive entrolysis (2.5 hrs), ileo-transverse colon anastomosis; closure of massive ventral hernia with 20 x 30 Strattice mesh LAPAROSCOPY DIAGNOSTIC Patient Active Problem List   Diagnosis Date Noted  . Crohn's disease 07/23/2015   Pt tachycardic but  not hypotensive.  Expected for this patient.  UOP trending up.  Cr trending down, most likely pain related   Plan: cont NPO, cont fluids PCA for better pain control Thorazine for hiccups Sliding scale insulin Re-enforce dressing today.   LOS: 1 day     .Rosario Adie, [facility], [place]   07/24/2015 8:38 AM

## 2015-07-24 NOTE — Progress Notes (Signed)
Surgery on call made aware of pt having hiccups and continued pain issues.  No new orders, will continue to monitor and report.

## 2015-07-24 NOTE — Progress Notes (Signed)
Have called Dr. Georgette Dover multiple times about increased HR.  Gave a 1000cc bolus first this am, no change in HR.  Called again for increased HR and gave 1 mg Ativan IV with a response of HR increased more from 140 to 148/min.  Continue to monitor patient.  Patient with adequate urine output, and better pain control with Morphine PCA.  Follow patient closely.  Aireanna Luellen Roselie Awkward RN

## 2015-07-25 LAB — GLUCOSE, CAPILLARY
GLUCOSE-CAPILLARY: 136 mg/dL — AB (ref 65–99)
GLUCOSE-CAPILLARY: 163 mg/dL — AB (ref 65–99)
GLUCOSE-CAPILLARY: 175 mg/dL — AB (ref 65–99)
GLUCOSE-CAPILLARY: 192 mg/dL — AB (ref 65–99)
GLUCOSE-CAPILLARY: 204 mg/dL — AB (ref 65–99)
Glucose-Capillary: 209 mg/dL — ABNORMAL HIGH (ref 65–99)
Glucose-Capillary: 179 mg/dL — ABNORMAL HIGH (ref 65–99)

## 2015-07-25 LAB — BASIC METABOLIC PANEL
Anion gap: 6 (ref 5–15)
BUN: 24 mg/dL — ABNORMAL HIGH (ref 6–20)
CHLORIDE: 109 mmol/L (ref 101–111)
CO2: 21 mmol/L — ABNORMAL LOW (ref 22–32)
CREATININE: 1.36 mg/dL — AB (ref 0.61–1.24)
Calcium: 6.7 mg/dL — ABNORMAL LOW (ref 8.9–10.3)
GFR, EST NON AFRICAN AMERICAN: 55 mL/min — AB (ref >60)
Glucose, Bld: 217 mg/dL — ABNORMAL HIGH (ref 65–99)
POTASSIUM: 4.4 mmol/L (ref 3.5–5.1)
SODIUM: 136 mmol/L (ref 135–145)

## 2015-07-25 LAB — CBC
HCT: 30.4 % — ABNORMAL LOW (ref 39.0–52.0)
HEMOGLOBIN: 10.2 g/dL — AB (ref 13.0–17.0)
MCH: 31.1 pg (ref 26.0–34.0)
MCHC: 33.6 g/dL (ref 30.0–36.0)
MCV: 92.7 fL (ref 78.0–100.0)
Platelets: 185 10*3/uL (ref 150–400)
RBC: 3.28 MIL/uL — AB (ref 4.22–5.81)
RDW: 14.4 % (ref 11.5–15.5)
WBC: 9.6 10*3/uL (ref 4.0–10.5)

## 2015-07-25 MED ORDER — METOPROLOL TARTRATE 1 MG/ML IV SOLN
5.0000 mg | Freq: Four times a day (QID) | INTRAVENOUS | Status: DC | PRN
  Administered 2015-07-25 (×2): 5 mg via INTRAVENOUS
  Filled 2015-07-25: qty 5

## 2015-07-25 MED ORDER — CETYLPYRIDINIUM CHLORIDE 0.05 % MT LIQD
7.0000 mL | Freq: Two times a day (BID) | OROMUCOSAL | Status: DC
  Administered 2015-07-26 – 2015-08-13 (×29): 7 mL via OROMUCOSAL

## 2015-07-25 MED ORDER — CHLORHEXIDINE GLUCONATE 0.12 % MT SOLN
15.0000 mL | Freq: Two times a day (BID) | OROMUCOSAL | Status: DC
  Administered 2015-07-25 – 2015-08-13 (×37): 15 mL via OROMUCOSAL
  Filled 2015-07-25 (×37): qty 15

## 2015-07-25 NOTE — Progress Notes (Signed)
Utilization Review Completed.[name] T9/09/2015  

## 2015-07-25 NOTE — Progress Notes (Signed)
2 Days Post-Op Ex lap, take down of ileostomy, hernia repair Subjective: Pt feeling better.  Still having hiccups.  No nausea.  OOB to chair yesterday  Objective: Vital signs in last 24 hours: Temp:  [98.4 F (36.9 C)-98.7 F (37.1 C)] 98.7 F (37.1 C) (09/11 0000) Pulse Rate:  [127-146] 139 (09/11 0630) Resp:  [10-30] 15 (09/11 0630) BP: (98-135)/(62-115) 113/76 mmHg (09/11 0655) SpO2:  [96 %-100 %] 98 % (09/11 0630) FiO2 (%):  [99 %-100 %] 99 % (09/10 1600)   Intake/Output from previous day: 09/10 0701 - 09/11 0700 In: 2975 [I.V.:1900; IV Piggyback:1075] Out: 1555 [Urine:1555] Intake/Output this shift:     General appearance: alert and cooperative GI: normal findings: soft, appropriately tender  Incision: clean and intact   Lab Results:   Recent Labs  07/24/15 0405 07/25/15 0433  WBC 12.4* 9.6  HGB 12.0* 10.2*  HCT 36.1* 30.4*  PLT 265 185   BMET  Recent Labs  07/24/15 0405 07/25/15 0433  NA 133* 136  K 4.5 4.4  CL 103 109  CO2 19* 21*  GLUCOSE 336* 217*  BUN 25* 24*  CREATININE 1.65* 1.36*  CALCIUM 7.2* 6.7*   PT/INR No results for input(s): LABPROT, INR in the last 72 hours. ABG No results for input(s): PHART, HCO3 in the last 72 hours.  Invalid input(s): PCO2, PO2  MEDS, Scheduled . heparin subcutaneous  5,000 Units Subcutaneous 3 times per day  . insulin aspart  0-15 Units Subcutaneous 6 times per day  . morphine   Intravenous 6 times per day  . sodium chloride  1,000 mL Intravenous Once    Studies/Results: No results found.  Assessment: s/p Procedure(s): Takedown ileostomy and repair of ostomy hernia, extensive entrolysis (2.5 hrs), ileo-transverse colon anastomosis; closure of massive ventral hernia with 20 x 30 Strattice mesh LAPAROSCOPY DIAGNOSTIC Patient Active Problem List   Diagnosis Date Noted  . Crohn's disease 07/23/2015   Pt tachycardic but not hypotensive.  Expected for this patient.  UOP excellent.  Cr trending down.      Plan: cont NPO, cont IV fluids PCA for pain control Thorazine for hiccups Sliding scale insulin for elevated blood glucose levels Cont metoprolol PRN for tachycardia, hold for low BP Cont SDU due to tachycardia D/c foley   LOS: 2 days     .[name], [facility], [place]   07/25/2015 7:55 AM

## 2015-07-26 LAB — BASIC METABOLIC PANEL
ANION GAP: 8 (ref 5–15)
BUN: 19 mg/dL (ref 6–20)
CHLORIDE: 111 mmol/L (ref 101–111)
CO2: 21 mmol/L — AB (ref 22–32)
Calcium: 7.3 mg/dL — ABNORMAL LOW (ref 8.9–10.3)
Creatinine, Ser: 1.06 mg/dL (ref 0.61–1.24)
GFR calc Af Amer: >60 mL/min (ref >60)
GLUCOSE: 156 mg/dL — AB (ref 65–99)
POTASSIUM: 4 mmol/L (ref 3.5–5.1)
Sodium: 140 mmol/L (ref 135–145)

## 2015-07-26 LAB — GLUCOSE, CAPILLARY
GLUCOSE-CAPILLARY: 131 mg/dL — AB (ref 65–99)
GLUCOSE-CAPILLARY: 154 mg/dL — AB (ref 65–99)
Glucose-Capillary: 150 mg/dL — ABNORMAL HIGH (ref 65–99)
Glucose-Capillary: 126 mg/dL — ABNORMAL HIGH (ref 65–99)
Glucose-Capillary: 127 mg/dL — ABNORMAL HIGH (ref 65–99)

## 2015-07-26 LAB — HEMOGLOBIN A1C
Hgb A1c MFr Bld: 8.3 % — ABNORMAL HIGH (ref 4.8–5.6)
MEAN PLASMA GLUCOSE: 192 mg/dL

## 2015-07-26 LAB — CBC
HEMATOCRIT: 28.3 % — AB (ref 39.0–52.0)
HEMOGLOBIN: 9.3 g/dL — AB (ref 13.0–17.0)
MCH: 31 pg (ref 26.0–34.0)
MCHC: 32.9 g/dL (ref 30.0–36.0)
MCV: 94.3 fL (ref 78.0–100.0)
Platelets: 182 10*3/uL (ref 150–400)
RBC: 3 MIL/uL — AB (ref 4.22–5.81)
RDW: 14.5 % (ref 11.5–15.5)
WBC: 9 10*3/uL (ref 4.0–10.5)

## 2015-07-26 LAB — PREALBUMIN: PREALBUMIN: 8.2 mg/dL — AB (ref 18–38)

## 2015-07-26 NOTE — Progress Notes (Signed)
3 Days Post-Op  Subjective: No complaints OOB x 1 yesterday, no flatus, no big complaints on PCA.  Objective: Vital signs in last 24 hours: Temp:  [98 F (36.7 C)-98.4 F (36.9 C)] 98 F (36.7 C) (09/12 0749) Pulse Rate:  [109-137] 118 (09/12 0600) Resp:  [9-20] 11 (09/12 1036) BP: (100-131)/(62-79) 114/72 mmHg (09/12 0600) SpO2:  [97 %-100 %] 100 % (09/12 1036) FiO2 (%):  [98 %-100 %] 100 % (09/12 1036) Weight:  [72.8 kg (160 lb 7.9 oz)] 72.8 kg (160 lb 7.9 oz) (09/12 0428) Last BM Date:  (had a ileostomy on friday before surgery) Afebrile, VSS   HR 118,  BP stable BMP and CBC stable Intake/Output from previous day: 09/11 0701 - 09/12 0700 In: 2350 [I.V.:2300; IV Piggyback:50] Out: 1250 [Urine:1250] Intake/Output this shift:    General appearance: alert, cooperative and no distress Resp: clear to auscultation bilaterally and BS down some in the Bases GI: distended, some serous drainage from the lower abdominal site.  Ostomy site looks fine, no nausea or vomiting, BS present   Lab Results:   Recent Labs  07/25/15 0433 07/26/15 0358  WBC 9.6 9.0  HGB 10.2* 9.3*  HCT 30.4* 28.3*  PLT 185 182    BMET  Recent Labs  07/25/15 0433 07/26/15 0358  NA 136 140  K 4.4 4.0  CL 109 111  CO2 21* 21*  GLUCOSE 217* 156*  BUN 24* 19  CREATININE 1.36* 1.06  CALCIUM 6.7* 7.3*   PT/INR No results for input(s): LABPROT, INR in the last 72 hours.  No results for input(s): AST, ALT, ALKPHOS, BILITOT, PROT, ALBUMIN in the last 168 hours.   Lipase     Component Value Date/Time   LIPASE 24 02/02/2009 0643     Studies/Results: No results found.  Medications: . antiseptic oral rinse  7 mL Mouth Rinse q12n4p  . chlorhexidine  15 mL Mouth Rinse BID  . heparin subcutaneous  5,000 Units Subcutaneous 3 times per day  . insulin aspart  0-15 Units Subcutaneous 6 times per day  . morphine   Intravenous 6 times per day  . sodium chloride  1,000 mL Intravenous Once   .  dextrose 5 % and 0.45 % NaCl with KCl 20 mEq/L 100 mL/hr at 07/26/15 1053   Prior to Admission medications   Medication Sig Start Date End Date Taking? Authorizing Provider  potassium citrate (UROCIT-K) 10 MEQ (1080 MG) SR tablet Take 40 mEq by mouth 4 (four) times daily.    Yes Historical Provider, MD  docusate sodium (COLACE) 100 MG capsule Take 1 capsule (100 mg total) by mouth 3 (three) times daily as needed. Patient not taking: Reported on 07/08/2015 08/27/14   Grier Mitts, PA-C  oxyCODONE-acetaminophen (ROXICET) 5-325 MG per tablet Take 1-2 tablets by mouth every 4 (four) hours as needed for severe pain. Patient not taking: Reported on 07/08/2015 08/27/14   Grier Mitts, PA-C     Assessment/Plan    Crohn's disease and prior ileostomy after open abdomen and secondary closure   No Crohn's treatment for 2 years prior to admission  - (followed by Dr. Laurence Spates) Prior Exploratory laparotomy with enterolysis, resection of inflammatory mass in right lower quadrant, and take-down of fistula with enteroenterostomy and resection with the small bowel to ascending colon anastomosis, Dr. Hassell Done, 10/29/2000.  Resection of ileocolonic anastomosis and creation of ileostomy, 11/04/2000, Dr. Hassell Done (hospitalized 10/2000-01/2001)     Attempted laparoscopy, laparotomy with extensive enterolysis (2.5 hours) and takedown of  paraileostomy hernia, creation of distal small bowel to transverse colon anastomosis, repair of ventral hernia with 20 x 30 cm Strattice mesh, 07/23/15, DR. Hassell Done. 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  On PCA  No flatus so far.  Mobilize, and work on [place].  LOS: 3 days    [name] 07/26/2015

## 2015-07-27 ENCOUNTER — Encounter (HOSPITAL_COMMUNITY): Payer: Self-pay | Admitting: Surgery

## 2015-07-27 LAB — GLUCOSE, CAPILLARY
GLUCOSE-CAPILLARY: 148 mg/dL — AB (ref 65–99)
GLUCOSE-CAPILLARY: 143 mg/dL — AB (ref 65–99)
GLUCOSE-CAPILLARY: 137 mg/dL — AB (ref 65–99)
Glucose-Capillary: 140 mg/dL — ABNORMAL HIGH (ref 65–99)
Glucose-Capillary: 124 mg/dL — ABNORMAL HIGH (ref 65–99)
Glucose-Capillary: 127 mg/dL — ABNORMAL HIGH (ref 65–99)

## 2015-07-27 NOTE — Evaluation (Signed)
Physical Therapy Evaluation Patient Details Name: [name] MRN: [id number] DOB: 15-Jul-1956 Today's Date: 07/27/2015   History of Present Illness  59 y.o. male with h/o small bowel resection for Crohn's in Dec 2001 admitted for take down of ileostomy and hernia repair.   Clinical Impression  Pt admitted with above diagnosis. Pt currently with functional limitations due to the deficits listed below (see PT Problem List). Pt ambulated 1600' with RW without loss of balance, HR 133-142 with walking.  Pt will benefit from skilled PT to increase their independence and safety with mobility to allow discharge to the venue listed below.       Follow Up Recommendations No PT follow up    Equipment Recommendations  Rolling walker with 5" wheels    Recommendations for Other Services       Precautions / Restrictions Precautions Precautions: Other (comment) Precaution Comments: monitor HR Restrictions Weight Bearing Restrictions: No      Mobility  Bed Mobility               General bed mobility comments: NT-up in recliner  Transfers Overall transfer level: Modified independent Equipment used: Rolling walker (2 wheeled)             General transfer comment: used armrests to push up from recliner  Ambulation/Gait Ambulation/Gait assistance: Supervision Ambulation Distance (Feet): 1600 Feet Assistive device: Rolling walker (2 wheeled) Gait Pattern/deviations: WFL(Within Functional Limits)   Gait velocity interpretation: at or above normal speed for age/gender General Gait Details: steady with RW, no LOB, HR 133-142 with walking  Stairs            Wheelchair Mobility    Modified Rankin (Stroke Patients Only)       Balance Overall balance assessment: Modified Independent                                           Pertinent Vitals/Pain Pain Assessment: No/denies pain Pain Score: 8  Pain Location: incision on abdomen Pain  Intervention(s): PCA encouraged;Monitored during session    Home Living Family/patient expects to be discharged to:: Private residence Living Arrangements: Spouse/significant other Available Help at Discharge: Available 24 hours/day         Home Layout: One level Home Equipment: None      Prior Function Level of Independence: Independent         Comments: worked at [employer]        Extremity/Trunk Assessment   Upper Extremity Assessment: Overall WFL for tasks assessed           Lower Extremity Assessment: Overall WFL for tasks assessed      Cervical / Trunk Assessment: Normal  Communication   Communication: No difficulties  Cognition Arousal/Alertness: Awake/alert Behavior During Therapy: WFL for tasks assessed/performed Overall Cognitive Status: Within Functional Limits for tasks assessed                      General Comments      Exercises        Assessment/Plan    PT Assessment Patient needs continued PT services  PT Diagnosis Acute pain   PT Problem List Decreased mobility  PT Treatment Interventions Gait training;Stair training;Therapeutic exercise   PT Goals (Current goals can be found in the Care Plan section) Acute Rehab PT Goals Patient Stated Goal: fishing, return  to work at [employer] PT Goal Formulation: With patient Time For Goal Achievement: 08/10/15 Potential to Achieve Goals: Good    Frequency Min 3X/week   Barriers to discharge        Co-evaluation               End of Session   Activity Tolerance: Patient tolerated treatment well Patient left: in chair;with call bell/phone within reach Nurse Communication: Mobility status         Time: 3267-1245 PT Time Calculation (min) (ACUTE ONLY): 36 min   Charges:     PT Treatments $Gait Training: 8-22 mins   PT G Codes:        Philomena Doheny 07/27/2015, 1:38 PM [phone]

## 2015-07-27 NOTE — Progress Notes (Signed)
4 Days Post-Op  Subjective: No flatus, still having some pain and using PCA more in the day than at night.  He does have some BS. 15 mg of morphine yesterday reocrded, he has 16 mg recorded for today so far. Objective: Vital signs in last 24 hours: Temp:  [97.9 F (36.6 C)-98.3 F (36.8 C)] 97.9 F (36.6 C) (09/13 0759) Pulse Rate:  [30-120] 100 (09/13 0800) Resp:  [8-22] 19 (09/13 1134) BP: (92-138)/(67-88) 130/69 mmHg (09/13 0800) SpO2:  [97 %-100 %] 99 % (09/13 1134) Weight:  [74.6 kg (164 lb 7.4 oz)] 74.6 kg (164 lb 7.4 oz) (09/13 0600) Last BM Date:  (had a ileostomy on friday before surgery) NPO No BM recorded Afebrile, VSS, HR up in the 120's No labs today Intake/Output from previous day: 09/12 0701 - 09/13 0700 In: 1700 [I.V.:1700] Out: 1150 [Urine:1150] Intake/Output this shift:    General appearance: alert, cooperative and no distress Resp: clear to auscultation bilaterally GI: up in chair, still having pain, + hyperactive BS, no flatus or BM, wound is about the same as yesterday.  Lab Results:   Recent Labs  07/25/15 0433 07/26/15 0358  WBC 9.6 9.0  HGB 10.2* 9.3*  HCT 30.4* 28.3*  PLT 185 182    BMET  Recent Labs  07/25/15 0433 07/26/15 0358  NA 136 140  K 4.4 4.0  CL 109 111  CO2 21* 21*  GLUCOSE 217* 156*  BUN 24* 19  CREATININE 1.36* 1.06  CALCIUM 6.7* 7.3*   PT/INR No results for input(s): LABPROT, INR in the last 72 hours.  No results for input(s): AST, ALT, ALKPHOS, BILITOT, PROT, ALBUMIN in the last 168 hours.   Lipase     Component Value Date/Time   LIPASE 24 02/02/2009 0643     Studies/Results: No results found.  Medications: . antiseptic oral rinse  7 mL Mouth Rinse q12n4p  . chlorhexidine  15 mL Mouth Rinse BID  . heparin subcutaneous  5,000 Units Subcutaneous 3 times per day  . insulin aspart  0-15 Units Subcutaneous 6 times per day  . morphine   Intravenous 6 times per day  . sodium chloride  1,000 mL Intravenous  Once   . dextrose 5 % and 0.45 % NaCl with KCl 20 mEq/L 100 mL (07/26/15 2100)    Assessment/Plan Crohn's disease and prior ileostomy after open abdomen and secondary closure  No Crohn's treatment for 2 years prior to admission - (followed by Dr. Laurence Spates) Prior Exploratory laparotomy with enterolysis, resection of inflammatory mass in right lower quadrant, and take-down of fistula with enteroenterostomy and resection with the small bowel to ascending colon anastomosis, Dr. Hassell Done, 10/29/2000. Resection of ileocolonic anastomosis and creation of ileostomy, 11/04/2000, Dr. Hassell Done (hospitalized 10/2000-01/2001)  Attempted laparoscopy, laparotomy with extensive enterolysis (2.5 hours) and takedown of paraileostomy hernia, creation of distal small bowel to transverse colon anastomosis, repair of ventral hernia with 20 x 30 cm Strattice mesh, 07/23/15, DR. Hassell Done.  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  i will recheck his labs in the AM.    LOS: 4 days    [name] 07/27/2015

## 2015-07-28 LAB — COMPREHENSIVE METABOLIC PANEL
ALT: 7 U/L — ABNORMAL LOW (ref 17–63)
ANION GAP: 7 (ref 5–15)
AST: 20 U/L (ref 15–41)
Albumin: 2.3 g/dL — ABNORMAL LOW (ref 3.5–5.0)
Alkaline Phosphatase: 63 U/L (ref 38–126)
BUN: 9 mg/dL (ref 6–20)
CHLORIDE: 109 mmol/L (ref 101–111)
CO2: 25 mmol/L (ref 22–32)
Calcium: 8 mg/dL — ABNORMAL LOW (ref 8.9–10.3)
Creatinine, Ser: 1.1 mg/dL (ref 0.61–1.24)
Glucose, Bld: 153 mg/dL — ABNORMAL HIGH (ref 65–99)
POTASSIUM: 3.7 mmol/L (ref 3.5–5.1)
Sodium: 141 mmol/L (ref 135–145)
Total Bilirubin: 0.6 mg/dL (ref 0.3–1.2)
Total Protein: 5.9 g/dL — ABNORMAL LOW (ref 6.5–8.1)

## 2015-07-28 LAB — GLUCOSE, CAPILLARY
GLUCOSE-CAPILLARY: 135 mg/dL — AB (ref 65–99)
Glucose-Capillary: 131 mg/dL — ABNORMAL HIGH (ref 65–99)
Glucose-Capillary: 117 mg/dL — ABNORMAL HIGH (ref 65–99)
Glucose-Capillary: 220 mg/dL — ABNORMAL HIGH (ref 65–99)
Glucose-Capillary: 117 mg/dL — ABNORMAL HIGH (ref 65–99)

## 2015-07-28 LAB — CBC
HCT: 27.6 % — ABNORMAL LOW (ref 39.0–52.0)
Hemoglobin: 9.3 g/dL — ABNORMAL LOW (ref 13.0–17.0)
MCH: 31.7 pg (ref 26.0–34.0)
MCHC: 33.7 g/dL (ref 30.0–36.0)
MCV: 94.2 fL (ref 78.0–100.0)
PLATELETS: 241 10*3/uL (ref 150–400)
RBC: 2.93 MIL/uL — ABNORMAL LOW (ref 4.22–5.81)
RDW: 14.1 % (ref 11.5–15.5)
WBC: 7 10*3/uL (ref 4.0–10.5)

## 2015-07-28 NOTE — Progress Notes (Signed)
Patient ID: [name], male   DOB: Sep 08, 1956, 59 y.o.   MRN: [id number] [facility] Progress Note:   5 Days Post-Op  Subjective: Mental status is clear.  Transferred to 4 east Objective: Vital signs in last 24 hours: Temp:  [98 F (36.7 C)-98.8 F (37.1 C)] 98.2 F (36.8 C) (09/14 0445) Pulse Rate:  [95-142] 108 (09/14 0445) Resp:  [11-23] 18 (09/14 0830) BP: (112-136)/(59-77) 134/75 mmHg (09/14 0445) SpO2:  [97 %-100 %] 100 % (09/14 0830)  Intake/Output from previous day: 09/13 0701 - 09/14 0700 In: 2500 [I.V.:2500] Out: 1450 [Urine:1450] Intake/Output this shift:    Physical Exam: Work of breathing is normal;  Abdomen protuberant;  Had 2 Bms last night.    Lab Results:  Results for orders placed or performed during the hospital encounter of 07/23/15 (from the past 48 hour(s))  Prealbumin     Status: Abnormal   Collection Time: 07/26/15 12:18 PM  Result Value Ref Range   Prealbumin 8.2 (L) 18 - 38 mg/dL    Comment: Performed at [facility]  Glucose, capillary     Status: Abnormal   Collection Time: 07/26/15  1:16 PM  Result Value Ref Range   Glucose-Capillary 154 (H) 65 - 99 mg/dL   Comment 1 Notify RN    Comment 2 Document in Chart   Glucose, capillary     Status: Abnormal   Collection Time: 07/26/15  4:54 PM  Result Value Ref Range   Glucose-Capillary 126 (H) 65 - 99 mg/dL   Comment 1 Notify RN    Comment 2 Document in Chart   Glucose, capillary     Status: Abnormal   Collection Time: 07/26/15  8:00 PM  Result Value Ref Range   Glucose-Capillary 127 (H) 65 - 99 mg/dL  Glucose, capillary     Status: Abnormal   Collection Time: 07/26/15 11:08 PM  Result Value Ref Range   Glucose-Capillary 140 (H) 65 - 99 mg/dL   Comment 1 Notify RN    Comment 2 Document in Chart   Glucose, capillary     Status: Abnormal   Collection Time: 07/27/15  4:17 AM  Result Value Ref Range   Glucose-Capillary 124 (H) 65 - 99 mg/dL   Comment 1 Notify RN    Comment 2 Document in Chart   Glucose, capillary     Status: Abnormal   Collection Time: 07/27/15  8:49 AM  Result Value Ref Range   Glucose-Capillary 127 (H) 65 - 99 mg/dL   Comment 1 Notify RN    Comment 2 Document in Chart   Glucose, capillary     Status: Abnormal   Collection Time: 07/27/15 12:53 PM  Result Value Ref Range   Glucose-Capillary 148 (H) 65 - 99 mg/dL   Comment 1 Notify RN    Comment 2 Document in Chart   Glucose, capillary     Status: Abnormal   Collection Time: 07/27/15  4:07 PM  Result Value Ref Range   Glucose-Capillary 143 (H) 65 - 99 mg/dL   Comment 1 Notify RN    Comment 2 Document in Chart   Glucose, capillary     Status: Abnormal   Collection Time: 07/27/15  8:12 PM  Result Value Ref Range   Glucose-Capillary 137 (H) 65 - 99 mg/dL  Glucose, capillary     Status: Abnormal   Collection Time: 07/28/15 12:10 AM  Result Value Ref Range   Glucose-Capillary 135 (H) 65 - 99 mg/dL  Glucose,  capillary     Status: Abnormal   Collection Time: 07/28/15  4:40 AM  Result Value Ref Range   Glucose-Capillary 131 (H) 65 - 99 mg/dL  CBC     Status: Abnormal   Collection Time: 07/28/15  5:07 AM  Result Value Ref Range   WBC 7.0 4.0 - 10.5 K/uL   RBC 2.93 (L) 4.22 - 5.81 MIL/uL   Hemoglobin 9.3 (L) 13.0 - 17.0 g/dL   HCT 27.6 (L) 39.0 - 52.0 %   MCV 94.2 78.0 - 100.0 fL   MCH 31.7 26.0 - 34.0 pg   MCHC 33.7 30.0 - 36.0 g/dL   RDW 14.1 11.5 - 15.5 %   Platelets 241 150 - 400 K/uL  Comprehensive metabolic panel     Status: Abnormal   Collection Time: 07/28/15  5:07 AM  Result Value Ref Range   Sodium 141 135 - 145 mmol/L   Potassium 3.7 3.5 - 5.1 mmol/L   Chloride 109 101 - 111 mmol/L   CO2 25 22 - 32 mmol/L   Glucose, Bld 153 (H) 65 - 99 mg/dL   BUN 9 6 - 20 mg/dL   Creatinine, Ser 1.10 0.61 - 1.24 mg/dL   Calcium 8.0 (L) 8.9 - 10.3 mg/dL   Total Protein 5.9 (L) 6.5 - 8.1 g/dL   Albumin 2.3 (L) 3.5 - 5.0 g/dL   AST 20 15 - 41 U/L   ALT 7 (L) 17 - 63 U/L    Alkaline Phosphatase 63 38 - 126 U/L   Total Bilirubin 0.6 0.3 - 1.2 mg/dL   GFR calc non Af Amer >60 >60 mL/min   GFR calc Af Amer >60 >60 mL/min    Comment: (NOTE) The eGFR has been calculated using the CKD EPI equation. This calculation has not been validated in all clinical situations. eGFR's persistently <60 mL/min signify possible Chronic Kidney Disease.    Anion gap 7 5 - 15  Glucose, capillary     Status: Abnormal   Collection Time: 07/28/15  7:31 AM  Result Value Ref Range   Glucose-Capillary 117 (H) 65 - 99 mg/dL    Radiology/Results: No results found.  Anti-infectives: Anti-infectives    Start     Dose/Rate Route Frequency Ordered Stop   07/23/15 2200  cefoTEtan (CEFOTAN) 2 g in dextrose 5 % 50 mL IVPB     2 g 100 mL/hr over 30 Minutes Intravenous Every 12 hours 07/23/15 1734 07/23/15 2234   07/23/15 0742  cefoTEtan (CEFOTAN) 2 g in dextrose 5 % 50 mL IVPB     2 g 100 mL/hr over 30 Minutes Intravenous On call to O.R. 07/23/15 0742 07/23/15 1043      Assessment/Plan: Problem List: Patient Active Problem List   Diagnosis Date Noted  . Crohn's disease 07/23/2015    Begin clear liquids PO.  Reduce IV rate 5 Days Post-Op    LOS: 5 days   [name] Done, MD, [facility] [phone] beeper [phone]  07/28/2015 10:05 AM

## 2015-07-28 NOTE — Progress Notes (Signed)
Physical Therapy Treatment/Discharge from PT Patient Details Name: [name] MRN: [id number] DOB: 29-Mar-1956 Today's Date: 07/28/2015    History of Present Illness 59 y.o. male with h/o small bowel resection for Crohn's in Dec 2001 admitted for take down of ileostomy and hernia repair.     PT Comments    Pt continues to mobilize at sup-Mod Ind level-walked ~2000 feet on today with support of IV pole. Feel nursing can/should oversee pt's mobility for remainder of hospital stay. Pt should be walking at least 2x/day. Do not feel skilled PT is still warranted at this time. Pt could also walk with family if/when they are present. Will sign off.   Follow Up Recommendations  No PT follow up     Equipment Recommendations  None recommended by PT    Recommendations for Other Services       Precautions / Restrictions Precautions Precautions: Other (comment) Precaution Comments: monitor HR Required Braces or Orthoses: Other Brace/Splint Other Brace/Splint: abdominal binder Restrictions Weight Bearing Restrictions: No    Mobility  Bed Mobility Overal bed mobility: Modified Independent             General bed mobility comments: HOB elevated. Increased time  Transfers Overall transfer level: Modified independent                  Ambulation/Gait Ambulation/Gait assistance: Supervision Ambulation Distance (Feet): 2000 Feet Assistive device:  (IV pole-2 hand support) Gait Pattern/deviations: Step-through pattern;Decreased stride length     General Gait Details: slow but steady gait speed. HR 150 bpm at highest. Several brief standing rest breaks taken to allow for recovery. No LOB.    Stairs            Wheelchair Mobility    Modified Rankin (Stroke Patients Only)       Balance                                    Cognition Arousal/Alertness: Awake/alert Behavior During Therapy: WFL for tasks assessed/performed Overall Cognitive  Status: Within Functional Limits for tasks assessed                      Exercises      General Comments        Pertinent Vitals/Pain Pain Assessment: 0-10 Pain Score: 7  Pain Location: abdomen Pain Descriptors / Indicators: Sore Pain Intervention(s): Monitored during session;PCA encouraged    Home Living                      Prior Function            PT Goals (current goals can now be found in the care plan section) Progress towards PT goals: Progressing toward goals (Nursing can oversee pt's mobility for remainder of hospital stay. Pt is mobilizing at sup-Mod Ind level. Skilled PT is no longer warranted at this time.)    Frequency       PT Plan Other (comment) (d/c from PT-Nursing can oversee pt's mobility for remainder of hospital stay. Pt is mobilizing at sup-Mod Ind level.)    Co-evaluation             End of Session   Activity Tolerance: Patient tolerated treatment well Patient left: in bed;with call bell/phone within reach;with bed alarm set     Time: 1410-1459 PT Time Calculation (min) (ACUTE ONLY): 49 min  Charges:  $  Gait Training: 38-52 mins                    G Codes:      Weston Anna, MPT Pager: [phone]

## 2015-07-28 NOTE — Care Management Note (Signed)
Case Management Note  Patient Details  Name: KENZO OZMENT MRN: [id number] Date of Birth: Apr 03, 1956  Subjective/Objective:  POD#5 Ventral hernia repair,takedown paraileostomy.clears,ivf,iv pain control. From home.                  Action/Plan:d/c plan home.   Expected Discharge Date:                  Expected Discharge Plan:  Home/Self Care  In-House Referral:     Discharge planning Services  CM Consult  Post Acute Care Choice:    Choice offered to:     DME Arranged:    DME Agency:     HH Arranged:    HH Agency:     Status of Service:  In process, will continue to follow  Medicare Important Message Given:    Date Medicare IM Given:    Medicare IM give by:    Date Additional Medicare IM Given:    Additional Medicare Important Message give by:     If discussed at [place] of Stay Meetings, dates discussed:    Additional Comments:  Dessa Phi, RN 07/28/2015, 8:33 PM

## 2015-07-29 LAB — GLUCOSE, CAPILLARY
GLUCOSE-CAPILLARY: 163 mg/dL — AB (ref 65–99)
GLUCOSE-CAPILLARY: 118 mg/dL — AB (ref 65–99)
GLUCOSE-CAPILLARY: 152 mg/dL — AB (ref 65–99)
Glucose-Capillary: 136 mg/dL — ABNORMAL HIGH (ref 65–99)
Glucose-Capillary: 148 mg/dL — ABNORMAL HIGH (ref 65–99)
Glucose-Capillary: 141 mg/dL — ABNORMAL HIGH (ref 65–99)
Glucose-Capillary: 156 mg/dL — ABNORMAL HIGH (ref 65–99)

## 2015-07-29 MED ORDER — INSULIN ASPART 100 UNIT/ML ~~LOC~~ SOLN
0.0000 [IU] | Freq: Three times a day (TID) | SUBCUTANEOUS | Status: DC
  Administered 2015-07-30: 5 [IU] via SUBCUTANEOUS
  Administered 2015-07-30 (×2): 2 [IU] via SUBCUTANEOUS
  Administered 2015-07-31: 3 [IU] via SUBCUTANEOUS
  Administered 2015-07-31 (×3): 2 [IU] via SUBCUTANEOUS
  Administered 2015-08-01 (×2): 3 [IU] via SUBCUTANEOUS
  Administered 2015-08-01: 2 [IU] via SUBCUTANEOUS
  Administered 2015-08-01: 3 [IU] via SUBCUTANEOUS
  Administered 2015-08-02 (×2): 2 [IU] via SUBCUTANEOUS
  Administered 2015-08-02 – 2015-08-03 (×3): 3 [IU] via SUBCUTANEOUS
  Administered 2015-08-03: 2 [IU] via SUBCUTANEOUS
  Administered 2015-08-03 – 2015-08-04 (×3): 3 [IU] via SUBCUTANEOUS
  Administered 2015-08-04: 1 [IU] via SUBCUTANEOUS
  Administered 2015-08-05 – 2015-08-06 (×5): 3 [IU] via SUBCUTANEOUS
  Administered 2015-08-06: 2 [IU] via SUBCUTANEOUS
  Administered 2015-08-07: 3 [IU] via SUBCUTANEOUS
  Administered 2015-08-07 (×2): 2 [IU] via SUBCUTANEOUS
  Administered 2015-08-07: 3 [IU] via SUBCUTANEOUS
  Administered 2015-08-08: 5 [IU] via SUBCUTANEOUS
  Administered 2015-08-08: 2 [IU] via SUBCUTANEOUS
  Administered 2015-08-08 – 2015-08-09 (×2): 3 [IU] via SUBCUTANEOUS
  Administered 2015-08-09: 8 [IU] via SUBCUTANEOUS
  Administered 2015-08-09: 2 [IU] via SUBCUTANEOUS
  Administered 2015-08-10: 5 [IU] via SUBCUTANEOUS
  Administered 2015-08-10: 2 [IU] via SUBCUTANEOUS
  Administered 2015-08-10 – 2015-08-11 (×3): 3 [IU] via SUBCUTANEOUS
  Administered 2015-08-12 (×2): 2 [IU] via SUBCUTANEOUS
  Administered 2015-08-12 – 2015-08-13 (×2): 3 [IU] via SUBCUTANEOUS

## 2015-07-29 NOTE — Progress Notes (Signed)
RN requested the PCP on call re: [facility] orders to be changed to [facility] iif medically appropriate.  Awaiting any new orders.

## 2015-07-30 LAB — GLUCOSE, CAPILLARY
GLUCOSE-CAPILLARY: 148 mg/dL — AB (ref 65–99)
GLUCOSE-CAPILLARY: 126 mg/dL — AB (ref 65–99)
GLUCOSE-CAPILLARY: 110 mg/dL — AB (ref 65–99)
Glucose-Capillary: 221 mg/dL — ABNORMAL HIGH (ref 65–99)

## 2015-07-31 LAB — GLUCOSE, CAPILLARY
GLUCOSE-CAPILLARY: 138 mg/dL — AB (ref 65–99)
GLUCOSE-CAPILLARY: 135 mg/dL — AB (ref 65–99)
Glucose-Capillary: 162 mg/dL — ABNORMAL HIGH (ref 65–99)
Glucose-Capillary: 150 mg/dL — ABNORMAL HIGH (ref 65–99)

## 2015-07-31 MED ORDER — BACITRACIN-NEOMYCIN-POLYMYXIN 400-5-5000 EX OINT: 1.0000 "application " | TOPICAL_OINTMENT | CUTANEOUS | Status: DC | PRN

## 2015-07-31 NOTE — Progress Notes (Signed)
  Progress Note: General Surgery Service   Subjective: Pain control controlled with pca but still severe if he doesn't press it for a few minutes  Objective: Vital signs in last 24 hours: Temp:  [98.2 F (36.8 C)-99.9 F (37.7 C)] 98.7 F (37.1 C) (09/17 1030) Pulse Rate:  [101-126] 101 (09/17 1030) Resp:  [12-19] 18 (09/17 1030) BP: (118-135)/(61-80) 120/64 mmHg (09/17 1030) SpO2:  [96 %-100 %] 100 % (09/17 1030) Last BM Date: 07/31/15  Intake/Output from previous day: 09/16 0701 - 09/17 0700 In: 1858.8 [P.O.:720; I.V.:1138.8] Out: 1275 [Urine:1275] Intake/Output this shift: Total I/O In: -  Out: 275 [Urine:275]  Lungs: ctab  Abd: soft, attp, wound clean and intact, some weeping from the central portion of the wound, will monitor ostomy site open and packed, clean base  Extremities: no edema  Neuro: aox4  Lab Results: CBC  No results for input(s): WBC, HGB, HCT, PLT in the last 72 hours. BMET No results for input(s): NA, K, CL, CO2, GLUCOSE, BUN, CREATININE, CALCIUM in the last 72 hours. PT/INR No results for input(s): LABPROT, INR in the last 72 hours. ABG No results for input(s): PHART, HCO3 in the last 72 hours.  Invalid input(s): PCO2, PO2  Studies/Results: No results found.  Anti-infectives: Anti-infectives    Start     Dose/Rate Route Frequency Ordered Stop   07/23/15 2200  cefoTEtan (CEFOTAN) 2 g in dextrose 5 % 50 mL IVPB     2 g 100 mL/hr over 30 Minutes Intravenous Every 12 hours 07/23/15 1734 07/23/15 2234   07/23/15 0742  cefoTEtan (CEFOTAN) 2 g in dextrose 5 % 50 mL IVPB     2 g 100 mL/hr over 30 Minutes Intravenous On call to O.R. 07/23/15 0742 07/23/15 1043      Medicaions: Scheduled Meds: . antiseptic oral rinse  7 mL Mouth Rinse q12n4p  . chlorhexidine  15 mL Mouth Rinse BID  . heparin subcutaneous  5,000 Units Subcutaneous 3 times per day  . insulin aspart  0-15 Units Subcutaneous TID WC & HS  . morphine   Intravenous 6 times per  day  . sodium chloride  1,000 mL Intravenous Once   Continuous Infusions: . dextrose 5 % and 0.45 % NaCl with KCl 20 mEq/L 75 mL/hr at 07/30/15 0700   PRN Meds:.diphenhydrAMINE **OR** diphenhydrAMINE, metoprolol, naloxone **AND** sodium chloride, ondansetron **OR** ondansetron (ZOFRAN) IV, ondansetron (ZOFRAN) IV  Assessment/Plan: Patient Active Problem List   Diagnosis Date Noted  . Crohn's disease 07/23/2015   s/p Procedure(s): Takedown ileostomy and repair of ostomy hernia, extensive entrolysis (2.5 hrs), ileostransverse colon anastomosis; closure of massive ventral hernia with 20 x 30 Strattice mesh LAPAROSCOPY DIAGNOSTIC Advance diet Continue pca for pain control after large abd wall reconstruction.  LOS: 8 days   Mickeal Skinner, MD Pg# [phone] [place] [facility]

## 2015-08-01 LAB — GLUCOSE, CAPILLARY
GLUCOSE-CAPILLARY: 200 mg/dL — AB (ref 65–99)
Glucose-Capillary: 146 mg/dL — ABNORMAL HIGH (ref 65–99)
Glucose-Capillary: 155 mg/dL — ABNORMAL HIGH (ref 65–99)
Glucose-Capillary: 163 mg/dL — ABNORMAL HIGH (ref 65–99)

## 2015-08-01 MED ORDER — MORPHINE SULFATE (PF) 2 MG/ML IV SOLN
2.0000 mg | INTRAVENOUS | Status: DC | PRN
Start: 2015-08-01 — End: 2015-08-13
  Administered 2015-08-01: 4 mg via INTRAVENOUS
  Administered 2015-08-01: 2 mg via INTRAVENOUS
  Administered 2015-08-01 (×2): 4 mg via INTRAVENOUS
  Administered 2015-08-01: 2 mg via INTRAVENOUS
  Administered 2015-08-02 – 2015-08-03 (×7): 4 mg via INTRAVENOUS
  Administered 2015-08-04 – 2015-08-05 (×5): 2 mg via INTRAVENOUS
  Administered 2015-08-06: 4 mg via INTRAVENOUS
  Administered 2015-08-06 (×4): 2 mg via INTRAVENOUS
  Administered 2015-08-07 (×3): 4 mg via INTRAVENOUS
  Administered 2015-08-07: 2 mg via INTRAVENOUS
  Administered 2015-08-08 – 2015-08-12 (×22): 4 mg via INTRAVENOUS
  Administered 2015-08-12: 2 mg via INTRAVENOUS
  Administered 2015-08-12 – 2015-08-13 (×2): 4 mg via INTRAVENOUS
  Filled 2015-08-01: qty 2
  Filled 2015-08-01: qty 1
  Filled 2015-08-01 (×4): qty 2
  Filled 2015-08-01 (×2): qty 1
  Filled 2015-08-01: qty 2
  Filled 2015-08-01: qty 1
  Filled 2015-08-01 (×2): qty 2
  Filled 2015-08-01: qty 1
  Filled 2015-08-01 (×4): qty 2
  Filled 2015-08-01: qty 1
  Filled 2015-08-01 (×10): qty 2
  Filled 2015-08-01: qty 1
  Filled 2015-08-01: qty 2
  Filled 2015-08-01 (×2): qty 1
  Filled 2015-08-01 (×2): qty 2
  Filled 2015-08-01: qty 1
  Filled 2015-08-01 (×3): qty 2
  Filled 2015-08-01: qty 1
  Filled 2015-08-01 (×4): qty 2
  Filled 2015-08-01: qty 1
  Filled 2015-08-01 (×2): qty 2
  Filled 2015-08-01: qty 1
  Filled 2015-08-01 (×5): qty 2
  Filled 2015-08-01: qty 1

## 2015-08-01 MED ORDER — ONDANSETRON HCL 4 MG/2ML IJ SOLN
4.0000 mg | INTRAMUSCULAR | Status: DC | PRN
  Administered 2015-08-10 – 2015-08-13 (×2): 4 mg via INTRAVENOUS
  Filled 2015-08-01 (×3): qty 2

## 2015-08-01 MED ORDER — OXYCODONE HCL 5 MG PO TABS
5.0000 mg | ORAL_TABLET | ORAL | Status: DC | PRN
  Administered 2015-08-01 – 2015-08-04 (×9): 10 mg via ORAL
  Administered 2015-08-04: 5 mg via ORAL
  Administered 2015-08-05: 10 mg via ORAL
  Administered 2015-08-05: 5 mg via ORAL
  Administered 2015-08-05 – 2015-08-13 (×22): 10 mg via ORAL
  Filled 2015-08-01 (×35): qty 2
  Filled 2015-08-01: qty 1
  Filled 2015-08-01: qty 2

## 2015-08-01 NOTE — Progress Notes (Signed)
9 Days Post-Op  Subjective: Pain getting better.  Tolerating solid diet.  Having liquid BMs.  Objective: Vital signs in last 24 hours: Temp:  [98.4 F (36.9 C)-99.6 F (37.6 C)] 98.5 F (36.9 C) (09/18 0537) Pulse Rate:  [99-111] 110 (09/18 0537) Resp:  [10-23] 20 (09/18 0800) BP: (110-127)/(56-72) 110/62 mmHg (09/18 0537) SpO2:  [95 %-100 %] 100 % (09/18 0800) Last BM Date: 07/31/15  Intake/Output from previous day: 09/17 0701 - 09/18 0700 In: 2515 [P.O.:720; I.V.:1795] Out: 375 [Urine:375] Intake/Output this shift:    PE: General- In NAD Abdomen-soft, ostomy wound clean, midline incision clean with 2 small, superficial open areas draining serous fluid, no erythema.  Lab Results:  No results for input(s): WBC, HGB, HCT, PLT in the last 72 hours. BMET No results for input(s): NA, K, CL, CO2, GLUCOSE, BUN, CREATININE, CALCIUM in the last 72 hours. PT/INR No results for input(s): LABPROT, INR in the last 72 hours. Comprehensive Metabolic Panel:    Component Value Date/Time   NA 141 07/28/2015 0507   NA 140 07/26/2015 0358   K 3.7 07/28/2015 0507   K 4.0 07/26/2015 0358   CL 109 07/28/2015 0507   CL 111 07/26/2015 0358   CO2 25 07/28/2015 0507   CO2 21* 07/26/2015 0358   BUN 9 07/28/2015 0507   BUN 19 07/26/2015 0358   CREATININE 1.10 07/28/2015 0507   CREATININE 1.06 07/26/2015 0358   GLUCOSE 153* 07/28/2015 0507   GLUCOSE 156* 07/26/2015 0358   CALCIUM 8.0* 07/28/2015 0507   CALCIUM 7.3* 07/26/2015 0358   AST 20 07/28/2015 0507   AST 28 02/17/2009 0755   ALT 7* 07/28/2015 0507   ALT 23 02/17/2009 0755   ALKPHOS 63 07/28/2015 0507   ALKPHOS 87 02/17/2009 0755   BILITOT 0.6 07/28/2015 0507   BILITOT 1.0 02/17/2009 0755   PROT 5.9* 07/28/2015 0507   PROT 6.8 02/17/2009 0755   ALBUMIN 2.3* 07/28/2015 0507   ALBUMIN 4.0 02/17/2009 0755     Studies/Results: No results found.  Anti-infectives: Anti-infectives    Start     Dose/Rate Route Frequency  Ordered Stop   07/23/15 2200  cefoTEtan (CEFOTAN) 2 g in dextrose 5 % 50 mL IVPB     2 g 100 mL/hr over 30 Minutes Intravenous Every 12 hours 07/23/15 1734 07/23/15 2234   07/23/15 0742  cefoTEtan (CEFOTAN) 2 g in dextrose 5 % 50 mL IVPB     2 g 100 mL/hr over 30 Minutes Intravenous On call to O.R. 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  Change to oral pain meds with IV backup.   ROSENBOWER,TODD J 08/01/2015

## 2015-08-02 LAB — GLUCOSE, CAPILLARY
GLUCOSE-CAPILLARY: 129 mg/dL — AB (ref 65–99)
GLUCOSE-CAPILLARY: 157 mg/dL — AB (ref 65–99)
GLUCOSE-CAPILLARY: 129 mg/dL — AB (ref 65–99)
GLUCOSE-CAPILLARY: 179 mg/dL — AB (ref 65–99)

## 2015-08-02 NOTE — Progress Notes (Signed)
Patient ID: [name], male   DOB: 1956-04-19, 59 y.o.   MRN: [id number] [facility] Surgery Progress Note:   10 Days Post-Op  Subjective: Mental status is clear.  Was awakened this morning with some new right sided abdominal pain.  Resolved with pain meds.   Objective: Vital signs in last 24 hours: Temp:  [97.7 F (36.5 C)-98.5 F (36.9 C)] 97.9 F (36.6 C) (09/19 0855) Pulse Rate:  [91-108] 91 (09/19 0855) Resp:  [19-20] 19 (09/19 0855) BP: (117-133)/(7-79) 127/79 mmHg (09/19 0855) SpO2:  [97 %-100 %] 99 % (09/19 0855)  Intake/Output from previous day: 09/18 0701 - 09/19 0700 In: 1275 [I.V.:1275] Out: 200 [Urine:200] Intake/Output this shift:    Physical Exam: Work of breathing is normal.  Incision with staples and sutures in place and with some exudative drainage.    Lab Results:  Results for orders placed or performed during the hospital encounter of 07/23/15 (from the past 48 hour(s))  Glucose, capillary     Status: Abnormal   Collection Time: 07/31/15 11:57 AM  Result Value Ref Range   Glucose-Capillary 135 (H) 65 - 99 mg/dL  Glucose, capillary     Status: Abnormal   Collection Time: 07/31/15  4:20 PM  Result Value Ref Range   Glucose-Capillary 162 (H) 65 - 99 mg/dL  Glucose, capillary     Status: Abnormal   Collection Time: 07/31/15 10:46 PM  Result Value Ref Range   Glucose-Capillary 150 (H) 65 - 99 mg/dL  Glucose, capillary     Status: Abnormal   Collection Time: 08/01/15  7:52 AM  Result Value Ref Range   Glucose-Capillary 146 (H) 65 - 99 mg/dL  Glucose, capillary     Status: Abnormal   Collection Time: 08/01/15 11:42 AM  Result Value Ref Range   Glucose-Capillary 200 (H) 65 - 99 mg/dL  Glucose, capillary     Status: Abnormal   Collection Time: 08/01/15  3:59 PM  Result Value Ref Range   Glucose-Capillary 155 (H) 65 - 99 mg/dL  Glucose, capillary     Status: Abnormal   Collection Time: 08/01/15  8:19 PM  Result Value Ref Range   Glucose-Capillary 163 (H) 65 - 99 mg/dL  Glucose, capillary     Status: Abnormal   Collection Time: 08/02/15  7:33 AM  Result Value Ref Range   Glucose-Capillary 129 (H) 65 - 99 mg/dL   Comment 1 Notify RN    Comment 2 Document in Chart     Radiology/Results: No results found.  Anti-infectives: Anti-infectives    Start     Dose/Rate Route Frequency Ordered Stop   07/23/15 2200  cefoTEtan (CEFOTAN) 2 g in dextrose 5 % 50 mL IVPB     2 g 100 mL/hr over 30 Minutes Intravenous Every 12 hours 07/23/15 1734 07/23/15 2234   07/23/15 0742  cefoTEtan (CEFOTAN) 2 g in dextrose 5 % 50 mL IVPB     2 g 100 mL/hr over 30 Minutes Intravenous On call to O.R. 07/23/15 0742 07/23/15 1043      Assessment/Plan: Problem List: Patient Active Problem List   Diagnosis Date Noted  . Crohn's disease 07/23/2015    Will check lab in am.  Decrease IV rate.  Wound management is different in that he has no fascia to close and the skin flaps from his old granulated wound are covering the Strattice mesh.   10 Days Post-Op    LOS: 10 days   [name] Done, MD, FACS  [facility] [phone] beeper [phone]  08/02/2015 11:04 AM

## 2015-08-02 NOTE — Care Management Note (Signed)
Case Management Note  Patient Details  Name: DAELYN MOZER MRN: [id number] Date of Birth: [birth year]  Subjective/Objective:     Patient chose [facility] for [place] change instruction. Email to [facility] aware of referral. Await HHRN, face to face order.               Action/Plan:d/c home w/HHC   Expected Discharge Date:                  Expected Discharge Plan:  [place]  In-House Referral:     Discharge planning Services  CM Consult  Post Acute Care Choice:    Choice offered to:  Patient  DME Arranged:    DME Agency:     HH Arranged:    [place] Agency:  [place]  Status of Service:  In process, will continue to follow  Medicare Important Message Given:    Date Medicare IM Given:    Medicare IM give by:    Date Additional Medicare IM Given:    Additional Medicare Important Message give by:     If discussed at [place] of Stay Meetings, dates discussed:    Additional Comments:  Dessa Phi, RN 08/02/2015, 4:02 PM

## 2015-08-02 NOTE — Care Management Note (Signed)
Case Management Note  Patient Details  Name: LEARY MCNULTY MRN: [id number] Date of Birth: 06/30/1956  Subjective/Objective:  Noted may need HHRN. If ordered can arrange-HHRN, face to face.Will provide [facility] agency list for choice.Await HHC order.                  Action/Plan:d/c plan home w/HHC.   Expected Discharge Date:                  Expected Discharge Plan:  [place]  In-House Referral:     Discharge planning Services  CM Consult  Post Acute Care Choice:    Choice offered to:  Patient  DME Arranged:    DME Agency:     HH Arranged:    [place] Agency:     Status of Service:  In process, will continue to follow  Medicare Important Message Given:    Date Medicare IM Given:    Medicare IM give by:    Date Additional Medicare IM Given:    Additional Medicare Important Message give by:     If discussed at [place] of Stay Meetings, dates discussed:    Additional Comments:  Dessa Phi, RN 08/02/2015, 11:40 AM

## 2015-08-03 LAB — CBC WITH DIFFERENTIAL/PLATELET
BASOS ABS: 0 10*3/uL (ref 0.0–0.1)
BASOS PCT: 0 %
Eosinophils Absolute: 0.1 10*3/uL (ref 0.0–0.7)
Eosinophils Relative: 1 %
HEMATOCRIT: 24 % — AB (ref 39.0–52.0)
HEMOGLOBIN: 8 g/dL — AB (ref 13.0–17.0)
Lymphocytes Relative: 14 %
Lymphs Abs: 0.9 10*3/uL (ref 0.7–4.0)
MCH: 30.8 pg (ref 26.0–34.0)
MCHC: 33.3 g/dL (ref 30.0–36.0)
MCV: 92.3 fL (ref 78.0–100.0)
Monocytes Absolute: 0.6 10*3/uL (ref 0.1–1.0)
Monocytes Relative: 9 %
NEUTROS ABS: 5 10*3/uL (ref 1.7–7.7)
NEUTROS PCT: 76 %
Platelets: 349 10*3/uL (ref 150–400)
RBC: 2.6 MIL/uL — AB (ref 4.22–5.81)
RDW: 14.1 % (ref 11.5–15.5)
WBC: 6.5 10*3/uL (ref 4.0–10.5)

## 2015-08-03 LAB — BASIC METABOLIC PANEL
ANION GAP: 11 (ref 5–15)
BUN: <5 mg/dL — ABNORMAL LOW (ref 6–20)
CALCIUM: 6.4 mg/dL — AB (ref 8.9–10.3)
CHLORIDE: 106 mmol/L (ref 101–111)
CO2: 23 mmol/L (ref 22–32)
Creatinine, Ser: 1.09 mg/dL (ref 0.61–1.24)
GFR calc non Af Amer: >60 mL/min (ref >60)
Glucose, Bld: 146 mg/dL — ABNORMAL HIGH (ref 65–99)
Potassium: 3 mmol/L — ABNORMAL LOW (ref 3.5–5.1)
Sodium: 140 mmol/L (ref 135–145)

## 2015-08-03 LAB — GLUCOSE, CAPILLARY
GLUCOSE-CAPILLARY: 199 mg/dL — AB (ref 65–99)
GLUCOSE-CAPILLARY: 159 mg/dL — AB (ref 65–99)
Glucose-Capillary: 134 mg/dL — ABNORMAL HIGH (ref 65–99)
Glucose-Capillary: 151 mg/dL — ABNORMAL HIGH (ref 65–99)

## 2015-08-03 MED ORDER — CALCIUM CITRATE-VITAMIN D 500-400 MG-UNIT PO CHEW
1.0000 | CHEWABLE_TABLET | Freq: Two times a day (BID) | ORAL | Status: DC
  Administered 2015-08-03 – 2015-08-05 (×5): 1 via ORAL
  Filled 2015-08-03 (×6): qty 1

## 2015-08-03 MED ORDER — MAGNESIUM OXIDE 400 (241.3 MG) MG PO TABS
200.0000 mg | ORAL_TABLET | Freq: Every day | ORAL | Status: DC
  Administered 2015-08-03 – 2015-08-13 (×11): 200 mg via ORAL
  Filled 2015-08-03 (×11): qty 1

## 2015-08-03 NOTE — Progress Notes (Signed)
CRITICAL VALUE ALERT  Critical value received:  Calcium 6.4  Date of notification:  08/03/15   Time of notification:  0625am   Critical value read back:Yes.    Nurse who received alert:   Lynwood Dawley  MD notified (1st page):  Johnathan Hausen  Time of first page:  0630  MD notified (2nd page):  Time of second page:  Responding MD:  Dr. Hassell Done  Time MD responded:  [phone]

## 2015-08-03 NOTE — Progress Notes (Signed)
Patient ID: [name], male   DOB: 04-07-1956, 59 y.o.   MRN: [id number] [facility] Surgery Progress Note:   11 Days Post-Op  Subjective: Mental status is clear.  Up walking around Objective: Vital signs in last 24 hours: Temp:  [98.4 F (36.9 C)-98.7 F (37.1 C)] 98.4 F (36.9 C) (09/20 0442) Pulse Rate:  [94-107] 107 (09/20 0442) Resp:  [16-18] 18 (09/20 0442) BP: (120-135)/(64-71) 120/64 mmHg (09/20 0442) SpO2:  [99 %-100 %] 100 % (09/20 0442) Weight:  [76.5 kg (168 lb 10.4 oz)-77.792 kg (171 lb 8 oz)] 76.5 kg (168 lb 10.4 oz) (09/20 0400)  Intake/Output from previous day: 09/19 0701 - 09/20 0700 In: 2497.5 [P.O.:1200; I.V.:1297.5] Out: 400 [Urine:400] Intake/Output this shift: Total I/O In: 280 [P.O.:280] Out: -   Physical Exam: Work of breathing is normal.  Incision covered  Lab Results:  Results for orders placed or performed during the hospital encounter of 07/23/15 (from the past 48 hour(s))  Glucose, capillary     Status: Abnormal   Collection Time: 08/01/15 11:42 AM  Result Value Ref Range   Glucose-Capillary 200 (H) 65 - 99 mg/dL  Glucose, capillary     Status: Abnormal   Collection Time: 08/01/15  3:59 PM  Result Value Ref Range   Glucose-Capillary 155 (H) 65 - 99 mg/dL  Glucose, capillary     Status: Abnormal   Collection Time: 08/01/15  8:19 PM  Result Value Ref Range   Glucose-Capillary 163 (H) 65 - 99 mg/dL  Glucose, capillary     Status: Abnormal   Collection Time: 08/02/15  7:33 AM  Result Value Ref Range   Glucose-Capillary 129 (H) 65 - 99 mg/dL   Comment 1 Notify RN    Comment 2 Document in Chart   Glucose, capillary     Status: Abnormal   Collection Time: 08/02/15 11:48 AM  Result Value Ref Range   Glucose-Capillary 157 (H) 65 - 99 mg/dL   Comment 1 Notify RN    Comment 2 Document in Chart   Glucose, capillary     Status: Abnormal   Collection Time: 08/02/15  5:28 PM  Result Value Ref Range   Glucose-Capillary 129 (H) 65 - 99  mg/dL   Comment 1 Notify RN    Comment 2 Document in Chart   Glucose, capillary     Status: Abnormal   Collection Time: 08/02/15  9:32 PM  Result Value Ref Range   Glucose-Capillary 179 (H) 65 - 99 mg/dL  CBC with Differential/Platelet     Status: Abnormal   Collection Time: 08/03/15  4:56 AM  Result Value Ref Range   WBC 6.5 4.0 - 10.5 K/uL   RBC 2.60 (L) 4.22 - 5.81 MIL/uL   Hemoglobin 8.0 (L) 13.0 - 17.0 g/dL   HCT 24.0 (L) 39.0 - 52.0 %   MCV 92.3 78.0 - 100.0 fL   MCH 30.8 26.0 - 34.0 pg   MCHC 33.3 30.0 - 36.0 g/dL   RDW 14.1 11.5 - 15.5 %   Platelets 349 150 - 400 K/uL   Neutrophils Relative % 76 %   Neutro Abs 5.0 1.7 - 7.7 K/uL   Lymphocytes Relative 14 %   Lymphs Abs 0.9 0.7 - 4.0 K/uL   Monocytes Relative 9 %   Monocytes Absolute 0.6 0.1 - 1.0 K/uL   Eosinophils Relative 1 %   Eosinophils Absolute 0.1 0.0 - 0.7 K/uL   Basophils Relative 0 %   Basophils Absolute 0.0 0.0 -  0.1 K/uL  Basic metabolic panel     Status: Abnormal   Collection Time: 08/03/15  4:56 AM  Result Value Ref Range   Sodium 140 135 - 145 mmol/L   Potassium 3.0 (L) 3.5 - 5.1 mmol/L   Chloride 106 101 - 111 mmol/L   CO2 23 22 - 32 mmol/L   Glucose, Bld 146 (H) 65 - 99 mg/dL   BUN <5 (L) 6 - 20 mg/dL   Creatinine, Ser 1.09 0.61 - 1.24 mg/dL   Calcium 6.4 (LL) 8.9 - 10.3 mg/dL    Comment: CRITICAL RESULT CALLED TO, READ BACK BY AND VERIFIED WITH: CORRIDON,C/4W _0  ON 08/03/15 BY KARCZEWSKI,S.    GFR calc non Af Amer >60 >60 mL/min   GFR calc Af Amer >60 >60 mL/min    Comment: (NOTE) The eGFR has been calculated using the CKD EPI equation. This calculation has not been validated in all clinical situations. eGFR's persistently <60 mL/min signify possible Chronic Kidney Disease.    Anion gap 11 5 - 15  Glucose, capillary     Status: Abnormal   Collection Time: 08/03/15  7:52 AM  Result Value Ref Range   Glucose-Capillary 134 (H) 65 - 99 mg/dL    Radiology/Results: No results  found.  Anti-infectives: Anti-infectives    Start     Dose/Rate Route Frequency Ordered Stop   07/23/15 2200  cefoTEtan (CEFOTAN) 2 g in dextrose 5 % 50 mL IVPB     2 g 100 mL/hr over 30 Minutes Intravenous Every 12 hours 07/23/15 1734 07/23/15 2234   07/23/15 0742  cefoTEtan (CEFOTAN) 2 g in dextrose 5 % 50 mL IVPB     2 g 100 mL/hr over 30 Minutes Intravenous On call to O.R. 07/23/15 0742 07/23/15 1043      Assessment/Plan: Problem List: Patient Active Problem List   Diagnosis Date Noted  . Crohn's disease 07/23/2015    Ca++ and K are down.  Will give Ca and Mg supplements orally.  Not ready for discharge 11 Days Post-Op    LOS: 11 days   [name] Done, MD, [facility] [phone] beeper [phone]  08/03/2015 10:58 AM

## 2015-08-03 NOTE — Progress Notes (Signed)
Nutrition Brief Note  Pt seen for LOS (day #11).  Wt Readings from Last 15 Encounters:  08/03/15 168 lb 10.4 oz (76.5 kg)  07/09/15 154 lb (69.854 kg)  08/27/14 160 lb (72.576 kg)  02/05/12 185 lb (83.915 kg)    Body mass index is 27.23 kg/(m^2). Patient meets criteria for over weight based on current BMI.   Current diet order is Regular, patient is consuming approximately 75-100% of meals at this time and since admission. Labs and medications reviewed. Per chart review, pt has gained 14 lbs in the past 1 month. He is POD # 11 takedown ileostomy and repair of ostomy hernia, extensive entrolysis (2.5 hrs), ileostransverse colon anastomosis; closure of massive ventral hernia with 20 x 30 Strattice mesh.  No nutrition interventions warranted at this time. If nutrition issues arise, please consult RD.      Jarome Matin, RD, LDN Inpatient Clinical Dietitian Pager # [phone] After hours/weekend pager # [phone]

## 2015-08-04 LAB — GLUCOSE, CAPILLARY
Glucose-Capillary: 131 mg/dL — ABNORMAL HIGH (ref 65–99)
Glucose-Capillary: 167 mg/dL — ABNORMAL HIGH (ref 65–99)
Glucose-Capillary: 106 mg/dL — ABNORMAL HIGH (ref 65–99)
Glucose-Capillary: 115 mg/dL — ABNORMAL HIGH (ref 65–99)

## 2015-08-04 NOTE — Progress Notes (Signed)
Patient ID: [name], male   DOB: Mar 06, 1956, 59 y.o.   MRN: [id number] [facility] Surgery Progress Note:   12 Days Post-Op  Subjective: Mental status is clear.  Feeling a bit better.   Objective: Vital signs in last 24 hours: Temp:  [97.6 F (36.4 C)-98.2 F (36.8 C)] 97.6 F (36.4 C) (09/21 0403) Pulse Rate:  [88] 88 (09/21 0403) Resp:  [16-20] 20 (09/21 0403) BP: (110-114)/(63-74) 114/74 mmHg (09/21 0403) SpO2:  [100 %] 100 % (09/21 0403)  Intake/Output from previous day: 09/20 0701 - 09/21 0700 In: 830 [P.O.:280; I.V.:550] Out: 375 [Urine:375] Intake/Output this shift:    Physical Exam: Work of breathing is not labored.  Incision in midline with scant drainage.  Ileostomy site looks good  Lab Results:  Results for orders placed or performed during the hospital encounter of 07/23/15 (from the past 48 hour(s))  Glucose, capillary     Status: Abnormal   Collection Time: 08/02/15  5:28 PM  Result Value Ref Range   Glucose-Capillary 129 (H) 65 - 99 mg/dL   Comment 1 Notify RN    Comment 2 Document in Chart   Glucose, capillary     Status: Abnormal   Collection Time: 08/02/15  9:32 PM  Result Value Ref Range   Glucose-Capillary 179 (H) 65 - 99 mg/dL  CBC with Differential/Platelet     Status: Abnormal   Collection Time: 08/03/15  4:56 AM  Result Value Ref Range   WBC 6.5 4.0 - 10.5 K/uL   RBC 2.60 (L) 4.22 - 5.81 MIL/uL   Hemoglobin 8.0 (L) 13.0 - 17.0 g/dL   HCT 24.0 (L) 39.0 - 52.0 %   MCV 92.3 78.0 - 100.0 fL   MCH 30.8 26.0 - 34.0 pg   MCHC 33.3 30.0 - 36.0 g/dL   RDW 14.1 11.5 - 15.5 %   Platelets 349 150 - 400 K/uL   Neutrophils Relative % 76 %   Neutro Abs 5.0 1.7 - 7.7 K/uL   Lymphocytes Relative 14 %   Lymphs Abs 0.9 0.7 - 4.0 K/uL   Monocytes Relative 9 %   Monocytes Absolute 0.6 0.1 - 1.0 K/uL   Eosinophils Relative 1 %   Eosinophils Absolute 0.1 0.0 - 0.7 K/uL   Basophils Relative 0 %   Basophils Absolute 0.0 0.0 - 0.1 K/uL  Basic  metabolic panel     Status: Abnormal   Collection Time: 08/03/15  4:56 AM  Result Value Ref Range   Sodium 140 135 - 145 mmol/L   Potassium 3.0 (L) 3.5 - 5.1 mmol/L   Chloride 106 101 - 111 mmol/L   CO2 23 22 - 32 mmol/L   Glucose, Bld 146 (H) 65 - 99 mg/dL   BUN <5 (L) 6 - 20 mg/dL   Creatinine, Ser 1.09 0.61 - 1.24 mg/dL   Calcium 6.4 (LL) 8.9 - 10.3 mg/dL    Comment: CRITICAL RESULT CALLED TO, READ BACK BY AND VERIFIED WITH: CORRIDON,C/4W _0  ON 08/03/15 BY KARCZEWSKI,S.    GFR calc non Af Amer >60 >60 mL/min   GFR calc Af Amer >60 >60 mL/min    Comment: (NOTE) The eGFR has been calculated using the CKD EPI equation. This calculation has not been validated in all clinical situations. eGFR's persistently <60 mL/min signify possible Chronic Kidney Disease.    Anion gap 11 5 - 15  Glucose, capillary     Status: Abnormal   Collection Time: 08/03/15  7:52 AM  Result Value  Ref Range   Glucose-Capillary 134 (H) 65 - 99 mg/dL  Glucose, capillary     Status: Abnormal   Collection Time: 08/03/15 12:22 PM  Result Value Ref Range   Glucose-Capillary 199 (H) 65 - 99 mg/dL  Glucose, capillary     Status: Abnormal   Collection Time: 08/03/15  5:44 PM  Result Value Ref Range   Glucose-Capillary 159 (H) 65 - 99 mg/dL  Glucose, capillary     Status: Abnormal   Collection Time: 08/03/15  8:52 PM  Result Value Ref Range   Glucose-Capillary 151 (H) 65 - 99 mg/dL  Glucose, capillary     Status: Abnormal   Collection Time: 08/04/15  7:29 AM  Result Value Ref Range   Glucose-Capillary 131 (H) 65 - 99 mg/dL  Glucose, capillary     Status: Abnormal   Collection Time: 08/04/15 11:43 AM  Result Value Ref Range   Glucose-Capillary 167 (H) 65 - 99 mg/dL    Radiology/Results: No results found.  Anti-infectives: Anti-infectives    Start     Dose/Rate Route Frequency Ordered Stop   07/23/15 2200  cefoTEtan (CEFOTAN) 2 g in dextrose 5 % 50 mL IVPB     2 g 100 mL/hr over 30 Minutes  Intravenous Every 12 hours 07/23/15 1734 07/23/15 2234   07/23/15 0742  cefoTEtan (CEFOTAN) 2 g in dextrose 5 % 50 mL IVPB     2 g 100 mL/hr over 30 Minutes Intravenous On call to O.R. 07/23/15 0742 07/23/15 1043      Assessment/Plan: Problem List: Patient Active Problem List   Diagnosis Date Noted  . Crohn's disease 07/23/2015    Slow recovery.  Recheck labs tomorrow especially looking for hypocalcemia.   12 Days Post-Op    LOS: 12 days   [name] Done, MD, [facility] [phone] beeper [phone]  08/04/2015 1:19 PM

## 2015-08-05 LAB — COMPREHENSIVE METABOLIC PANEL
ALT: 7 U/L — ABNORMAL LOW (ref 17–63)
AST: 15 U/L (ref 15–41)
Albumin: 2.1 g/dL — ABNORMAL LOW (ref 3.5–5.0)
Alkaline Phosphatase: 47 U/L (ref 38–126)
Anion gap: 9 (ref 5–15)
BUN: <5 mg/dL — ABNORMAL LOW (ref 6–20)
CO2: 25 mmol/L (ref 22–32)
Calcium: 6.3 mg/dL — CL (ref 8.9–10.3)
Chloride: 106 mmol/L (ref 101–111)
Creatinine, Ser: 1.1 mg/dL (ref 0.61–1.24)
GFR calc Af Amer: >60 mL/min (ref >60)
GFR calc non Af Amer: >60 mL/min (ref >60)
Glucose, Bld: 148 mg/dL — ABNORMAL HIGH (ref 65–99)
Potassium: 2.9 mmol/L — ABNORMAL LOW (ref 3.5–5.1)
Sodium: 140 mmol/L (ref 135–145)
Total Bilirubin: 0.1 mg/dL — ABNORMAL LOW (ref 0.3–1.2)
Total Protein: 5.6 g/dL — ABNORMAL LOW (ref 6.5–8.1)

## 2015-08-05 LAB — CBC WITH DIFFERENTIAL/PLATELET
Basophils Absolute: 0 10*3/uL (ref 0.0–0.1)
Basophils Relative: 0 %
Eosinophils Absolute: 0.1 10*3/uL (ref 0.0–0.7)
Eosinophils Relative: 2 %
HCT: 23.7 % — ABNORMAL LOW (ref 39.0–52.0)
Hemoglobin: 7.9 g/dL — ABNORMAL LOW (ref 13.0–17.0)
Lymphocytes Relative: 19 %
Lymphs Abs: 1.2 10*3/uL (ref 0.7–4.0)
MCH: 30.7 pg (ref 26.0–34.0)
MCHC: 33.3 g/dL (ref 30.0–36.0)
MCV: 92.2 fL (ref 78.0–100.0)
Monocytes Absolute: 0.6 10*3/uL (ref 0.1–1.0)
Monocytes Relative: 10 %
Neutro Abs: 4.2 10*3/uL (ref 1.7–7.7)
Neutrophils Relative %: 69 %
Platelets: 410 10*3/uL — ABNORMAL HIGH (ref 150–400)
RBC: 2.57 MIL/uL — ABNORMAL LOW (ref 4.22–5.81)
RDW: 14 % (ref 11.5–15.5)
WBC: 6.2 10*3/uL (ref 4.0–10.5)

## 2015-08-05 LAB — GLUCOSE, CAPILLARY
GLUCOSE-CAPILLARY: 168 mg/dL — AB (ref 65–99)
GLUCOSE-CAPILLARY: 197 mg/dL — AB (ref 65–99)
Glucose-Capillary: 105 mg/dL — ABNORMAL HIGH (ref 65–99)
Glucose-Capillary: 175 mg/dL — ABNORMAL HIGH (ref 65–99)

## 2015-08-05 MED ORDER — POTASSIUM CHLORIDE CRYS ER 20 MEQ PO TBCR
20.0000 meq | EXTENDED_RELEASE_TABLET | Freq: Three times a day (TID) | ORAL | Status: DC
  Administered 2015-08-05 – 2015-08-13 (×25): 20 meq via ORAL
  Filled 2015-08-05 (×25): qty 1

## 2015-08-05 MED ORDER — CALCIUM CITRATE-VITAMIN D 500-400 MG-UNIT PO CHEW
2.0000 | CHEWABLE_TABLET | Freq: Two times a day (BID) | ORAL | Status: DC
  Administered 2015-08-05 – 2015-08-13 (×16): 2 via ORAL
  Filled 2015-08-05 (×19): qty 2

## 2015-08-05 NOTE — Progress Notes (Signed)
Spoke with pt who states that he will need [facility] when he is discharged and will continue with [place].  MD, need HHRN orders and Face to Face. Thanks.

## 2015-08-05 NOTE — Progress Notes (Signed)
Patient ID: [name], male   DOB: February 18, 1956, 59 y.o.   MRN: [id number] [facility] Surgery Progress Note:   13 Days Post-Op  Subjective: Mental status is clear.  Having intermittent pain and is worried about caring for self at home.   Objective: Vital signs in last 24 hours: Temp:  [98 F (36.7 C)-98.4 F (36.9 C)] 98.2 F (36.8 C) (09/22 0501) Pulse Rate:  [82-87] 87 (09/22 0501) Resp:  [18-20] 20 (09/22 0501) BP: (100-114)/(64-66) 100/65 mmHg (09/22 0501) SpO2:  [99 %-100 %] 99 % (09/22 0501)  Intake/Output from previous day: 09/21 0701 - 09/22 0700 In: 1440 [P.O.:240; I.V.:1200] Out: -  Intake/Output this shift:    Physical Exam: Work of breathing is normal.  Incisions are being dressed daily  Lab Results:  Results for orders placed or performed during the hospital encounter of 07/23/15 (from the past 48 hour(s))  Glucose, capillary     Status: Abnormal   Collection Time: 08/03/15 12:22 PM  Result Value Ref Range   Glucose-Capillary 199 (H) 65 - 99 mg/dL  Glucose, capillary     Status: Abnormal   Collection Time: 08/03/15  5:44 PM  Result Value Ref Range   Glucose-Capillary 159 (H) 65 - 99 mg/dL  Glucose, capillary     Status: Abnormal   Collection Time: 08/03/15  8:52 PM  Result Value Ref Range   Glucose-Capillary 151 (H) 65 - 99 mg/dL  Glucose, capillary     Status: Abnormal   Collection Time: 08/04/15  7:29 AM  Result Value Ref Range   Glucose-Capillary 131 (H) 65 - 99 mg/dL  Glucose, capillary     Status: Abnormal   Collection Time: 08/04/15 11:43 AM  Result Value Ref Range   Glucose-Capillary 167 (H) 65 - 99 mg/dL  Glucose, capillary     Status: Abnormal   Collection Time: 08/04/15  4:30 PM  Result Value Ref Range   Glucose-Capillary 106 (H) 65 - 99 mg/dL  Glucose, capillary     Status: Abnormal   Collection Time: 08/04/15  8:57 PM  Result Value Ref Range   Glucose-Capillary 115 (H) 65 - 99 mg/dL  Comprehensive metabolic panel     Status:  Abnormal   Collection Time: 08/05/15  4:49 AM  Result Value Ref Range   Sodium 140 135 - 145 mmol/L   Potassium 2.9 (L) 3.5 - 5.1 mmol/L   Chloride 106 101 - 111 mmol/L   CO2 25 22 - 32 mmol/L   Glucose, Bld 148 (H) 65 - 99 mg/dL   BUN <5 (L) 6 - 20 mg/dL   Creatinine, Ser 1.10 0.61 - 1.24 mg/dL   Calcium 6.3 (LL) 8.9 - 10.3 mg/dL    Comment: CRITICAL RESULT CALLED TO, READ BACK BY AND VERIFIED WITH: F.WILFONG,RN AT 5732 ON 08/05/15 BY W.SHEA    Total Protein 5.6 (L) 6.5 - 8.1 g/dL   Albumin 2.1 (L) 3.5 - 5.0 g/dL   AST 15 15 - 41 U/L   ALT 7 (L) 17 - 63 U/L   Alkaline Phosphatase 47 38 - 126 U/L   Total Bilirubin 0.1 (L) 0.3 - 1.2 mg/dL   GFR calc non Af Amer >60 >60 mL/min   GFR calc Af Amer >60 >60 mL/min    Comment: (NOTE) The eGFR has been calculated using the CKD EPI equation. This calculation has not been validated in all clinical situations. eGFR's persistently <60 mL/min signify possible Chronic Kidney Disease.    Anion gap 9 5 -  15  CBC with Differential/Platelet     Status: Abnormal   Collection Time: 08/05/15  4:49 AM  Result Value Ref Range   WBC 6.2 4.0 - 10.5 K/uL   RBC 2.57 (L) 4.22 - 5.81 MIL/uL   Hemoglobin 7.9 (L) 13.0 - 17.0 g/dL   HCT 23.7 (L) 39.0 - 52.0 %   MCV 92.2 78.0 - 100.0 fL   MCH 30.7 26.0 - 34.0 pg   MCHC 33.3 30.0 - 36.0 g/dL   RDW 14.0 11.5 - 15.5 %   Platelets 410 (H) 150 - 400 K/uL   Neutrophils Relative % 69 %   Neutro Abs 4.2 1.7 - 7.7 K/uL   Lymphocytes Relative 19 %   Lymphs Abs 1.2 0.7 - 4.0 K/uL   Monocytes Relative 10 %   Monocytes Absolute 0.6 0.1 - 1.0 K/uL   Eosinophils Relative 2 %   Eosinophils Absolute 0.1 0.0 - 0.7 K/uL   Basophils Relative 0 %   Basophils Absolute 0.0 0.0 - 0.1 K/uL  Glucose, capillary     Status: Abnormal   Collection Time: 08/05/15  7:56 AM  Result Value Ref Range   Glucose-Capillary 105 (H) 65 - 99 mg/dL    Radiology/Results: No results found.  Anti-infectives: Anti-infectives    Start      Dose/Rate Route Frequency Ordered Stop   07/23/15 2200  cefoTEtan (CEFOTAN) 2 g in dextrose 5 % 50 mL IVPB     2 g 100 mL/hr over 30 Minutes Intravenous Every 12 hours 07/23/15 1734 07/23/15 2234   07/23/15 0742  cefoTEtan (CEFOTAN) 2 g in dextrose 5 % 50 mL IVPB     2 g 100 mL/hr over 30 Minutes Intravenous On call to O.R. 07/23/15 0742 07/23/15 1043      Assessment/Plan: Problem List: Patient Active Problem List   Diagnosis Date Noted  . Crohn's disease 07/23/2015    HG down below 8;  Hypokalemia persists.  Increase supplements.  Recheck lytes tomorrow 13 Days Post-Op    LOS: 13 days   [name] Done, MD, [facility] [phone] beeper [phone]  08/05/2015 9:24 AM

## 2015-08-06 LAB — GLUCOSE, CAPILLARY
Glucose-Capillary: 123 mg/dL — ABNORMAL HIGH (ref 65–99)
Glucose-Capillary: 180 mg/dL — ABNORMAL HIGH (ref 65–99)
Glucose-Capillary: 152 mg/dL — ABNORMAL HIGH (ref 65–99)
Glucose-Capillary: 156 mg/dL — ABNORMAL HIGH (ref 65–99)

## 2015-08-06 LAB — BASIC METABOLIC PANEL
Anion gap: 9 (ref 5–15)
CHLORIDE: 104 mmol/L (ref 101–111)
CO2: 26 mmol/L (ref 22–32)
CREATININE: 1.17 mg/dL (ref 0.61–1.24)
Calcium: 6.4 mg/dL — CL (ref 8.9–10.3)
GFR calc Af Amer: >60 mL/min (ref >60)
GFR calc non Af Amer: >60 mL/min (ref >60)
GLUCOSE: 139 mg/dL — AB (ref 65–99)
Potassium: 3.2 mmol/L — ABNORMAL LOW (ref 3.5–5.1)
SODIUM: 139 mmol/L (ref 135–145)

## 2015-08-06 NOTE — Progress Notes (Signed)
Patient ID: [name], male   DOB: 08/02/1956, 59 y.o.   MRN: [id number] Feeling better and had a good night.   [facility] Progress Note:   14 Days Post-Op  Subjective: Mental status is clear.  Improved Objective: Vital signs in last 24 hours: Temp:  [98 F (36.7 C)-99 F (37.2 C)] 99 F (37.2 C) (09/22 2109) Pulse Rate:  [85-104] 104 (09/22 2109) Resp:  [18-20] 18 (09/22 2109) BP: (102-114)/(56-72) 114/56 mmHg (09/22 2109) SpO2:  [98 %-99 %] 98 % (09/22 2109)  Intake/Output from previous day: 09/22 0701 - 09/23 0700 In: 1200 [I.V.:1200] Out: -  Intake/Output this shift:    Physical Exam: Work of breathing is normal.  Walking around.    Lab Results:  Results for orders placed or performed during the hospital encounter of 07/23/15 (from the past 48 hour(s))  Glucose, capillary     Status: Abnormal   Collection Time: 08/04/15 11:43 AM  Result Value Ref Range   Glucose-Capillary 167 (H) 65 - 99 mg/dL  Glucose, capillary     Status: Abnormal   Collection Time: 08/04/15  4:30 PM  Result Value Ref Range   Glucose-Capillary 106 (H) 65 - 99 mg/dL  Glucose, capillary     Status: Abnormal   Collection Time: 08/04/15  8:57 PM  Result Value Ref Range   Glucose-Capillary 115 (H) 65 - 99 mg/dL  Comprehensive metabolic panel     Status: Abnormal   Collection Time: 08/05/15  4:49 AM  Result Value Ref Range   Sodium 140 135 - 145 mmol/L   Potassium 2.9 (L) 3.5 - 5.1 mmol/L   Chloride 106 101 - 111 mmol/L   CO2 25 22 - 32 mmol/L   Glucose, Bld 148 (H) 65 - 99 mg/dL   BUN <5 (L) 6 - 20 mg/dL   Creatinine, Ser 1.10 0.61 - 1.24 mg/dL   Calcium 6.3 (LL) 8.9 - 10.3 mg/dL    Comment: CRITICAL RESULT CALLED TO, READ BACK BY AND VERIFIED WITH: F.WILFONG,RN AT 3419 ON 08/05/15 BY W.SHEA    Total Protein 5.6 (L) 6.5 - 8.1 g/dL   Albumin 2.1 (L) 3.5 - 5.0 g/dL   AST 15 15 - 41 U/L   ALT 7 (L) 17 - 63 U/L   Alkaline Phosphatase 47 38 - 126 U/L   Total Bilirubin 0.1 (L)  0.3 - 1.2 mg/dL   GFR calc non Af Amer >60 >60 mL/min   GFR calc Af Amer >60 >60 mL/min    Comment: (NOTE) The eGFR has been calculated using the CKD EPI equation. This calculation has not been validated in all clinical situations. eGFR's persistently <60 mL/min signify possible Chronic Kidney Disease.    Anion gap 9 5 - 15  CBC with Differential/Platelet     Status: Abnormal   Collection Time: 08/05/15  4:49 AM  Result Value Ref Range   WBC 6.2 4.0 - 10.5 K/uL   RBC 2.57 (L) 4.22 - 5.81 MIL/uL   Hemoglobin 7.9 (L) 13.0 - 17.0 g/dL   HCT 23.7 (L) 39.0 - 52.0 %   MCV 92.2 78.0 - 100.0 fL   MCH 30.7 26.0 - 34.0 pg   MCHC 33.3 30.0 - 36.0 g/dL   RDW 14.0 11.5 - 15.5 %   Platelets 410 (H) 150 - 400 K/uL   Neutrophils Relative % 69 %   Neutro Abs 4.2 1.7 - 7.7 K/uL   Lymphocytes Relative 19 %   Lymphs Abs 1.2 0.7 -  4.0 K/uL   Monocytes Relative 10 %   Monocytes Absolute 0.6 0.1 - 1.0 K/uL   Eosinophils Relative 2 %   Eosinophils Absolute 0.1 0.0 - 0.7 K/uL   Basophils Relative 0 %   Basophils Absolute 0.0 0.0 - 0.1 K/uL  Glucose, capillary     Status: Abnormal   Collection Time: 08/05/15  7:56 AM  Result Value Ref Range   Glucose-Capillary 105 (H) 65 - 99 mg/dL  Glucose, capillary     Status: Abnormal   Collection Time: 08/05/15 12:04 PM  Result Value Ref Range   Glucose-Capillary 175 (H) 65 - 99 mg/dL  Glucose, capillary     Status: Abnormal   Collection Time: 08/05/15  5:16 PM  Result Value Ref Range   Glucose-Capillary 168 (H) 65 - 99 mg/dL  Glucose, capillary     Status: Abnormal   Collection Time: 08/05/15  9:17 PM  Result Value Ref Range   Glucose-Capillary 197 (H) 65 - 99 mg/dL  Basic metabolic panel     Status: Abnormal   Collection Time: 08/06/15  5:40 AM  Result Value Ref Range   Sodium 139 135 - 145 mmol/L   Potassium 3.2 (L) 3.5 - 5.1 mmol/L   Chloride 104 101 - 111 mmol/L   CO2 26 22 - 32 mmol/L   Glucose, Bld 139 (H) 65 - 99 mg/dL   BUN <5 (L) 6 - 20  mg/dL   Creatinine, Ser 1.17 0.61 - 1.24 mg/dL   Calcium 6.4 (LL) 8.9 - 10.3 mg/dL    Comment: CRITICAL RESULT CALLED TO, READ BACK BY AND VERIFIED WITHWanda Plump [facility] RN @ [phone] ON 08/06/15 BY C DAVIS    GFR calc non Af Amer >60 >60 mL/min   GFR calc Af Amer >60 >60 mL/min    Comment: (NOTE) The eGFR has been calculated using the CKD EPI equation. This calculation has not been validated in all clinical situations. eGFR's persistently <60 mL/min signify possible Chronic Kidney Disease.    Anion gap 9 5 - 15  Glucose, capillary     Status: Abnormal   Collection Time: 08/06/15  7:34 AM  Result Value Ref Range   Glucose-Capillary 123 (H) 65 - 99 mg/dL    Radiology/Results: No results found.  Anti-infectives: Anti-infectives    Start     Dose/Rate Route Frequency Ordered Stop   07/23/15 2200  cefoTEtan (CEFOTAN) 2 g in dextrose 5 % 50 mL IVPB     2 g 100 mL/hr over 30 Minutes Intravenous Every 12 hours 07/23/15 1734 07/23/15 2234   07/23/15 0742  cefoTEtan (CEFOTAN) 2 g in dextrose 5 % 50 mL IVPB     2 g 100 mL/hr over 30 Minutes Intravenous On call to O.R. 07/23/15 0742 07/23/15 1043      Assessment/Plan: Problem List: Patient Active Problem List   Diagnosis Date Noted  . Crohn's disease 07/23/2015    Still with hypokalemia and hypocalcemia but getting better.  Will likely be here over the weekend.   14 Days Post-Op    LOS: 14 days   [name] Done, MD, [facility] [phone] beeper [phone]  08/06/2015 8:23 AM

## 2015-08-07 LAB — CBC WITH DIFFERENTIAL/PLATELET
BASOS PCT: 0 %
Basophils Absolute: 0 10*3/uL (ref 0.0–0.1)
Eosinophils Absolute: 0.2 10*3/uL (ref 0.0–0.7)
Eosinophils Relative: 2 %
HEMATOCRIT: 24.7 % — AB (ref 39.0–52.0)
HEMOGLOBIN: 8 g/dL — AB (ref 13.0–17.0)
LYMPHS PCT: 20 %
Lymphs Abs: 1.3 10*3/uL (ref 0.7–4.0)
MCH: 30.7 pg (ref 26.0–34.0)
MCHC: 32.4 g/dL (ref 30.0–36.0)
MCV: 94.6 fL (ref 78.0–100.0)
MONO ABS: 0.9 10*3/uL (ref 0.1–1.0)
MONOS PCT: 13 %
NEUTROS ABS: 4.3 10*3/uL (ref 1.7–7.7)
NEUTROS PCT: 65 %
Platelets: 516 10*3/uL — ABNORMAL HIGH (ref 150–400)
RBC: 2.61 MIL/uL — ABNORMAL LOW (ref 4.22–5.81)
RDW: 14.4 % (ref 11.5–15.5)
WBC: 6.7 10*3/uL (ref 4.0–10.5)

## 2015-08-07 LAB — COMPREHENSIVE METABOLIC PANEL
ALK PHOS: 53 U/L (ref 38–126)
ALT: 7 U/L — ABNORMAL LOW (ref 17–63)
AST: 14 U/L — AB (ref 15–41)
Albumin: 2.1 g/dL — ABNORMAL LOW (ref 3.5–5.0)
Anion gap: 9 (ref 5–15)
BILIRUBIN TOTAL: 0.2 mg/dL — AB (ref 0.3–1.2)
BUN: 5 mg/dL — AB (ref 6–20)
CALCIUM: 6.8 mg/dL — AB (ref 8.9–10.3)
CO2: 28 mmol/L (ref 22–32)
Chloride: 103 mmol/L (ref 101–111)
Creatinine, Ser: 1.05 mg/dL (ref 0.61–1.24)
GFR calc Af Amer: >60 mL/min (ref >60)
GLUCOSE: 113 mg/dL — AB (ref 65–99)
POTASSIUM: 3.7 mmol/L (ref 3.5–5.1)
Sodium: 140 mmol/L (ref 135–145)
TOTAL PROTEIN: 5.8 g/dL — AB (ref 6.5–8.1)

## 2015-08-07 LAB — GLUCOSE, CAPILLARY
GLUCOSE-CAPILLARY: 116 mg/dL — AB (ref 65–99)
Glucose-Capillary: 182 mg/dL — ABNORMAL HIGH (ref 65–99)
Glucose-Capillary: 170 mg/dL — ABNORMAL HIGH (ref 65–99)
Glucose-Capillary: 139 mg/dL — ABNORMAL HIGH (ref 65–99)

## 2015-08-07 NOTE — Progress Notes (Signed)
Patient ID: [name], male   DOB: 1956/01/26, 59 y.o.   MRN: [id number]  [facility]  POD#: 15  Subjective: Patient in bed, been up to bathroom a lot.  Taking po diet.  Objective: Vital signs in last 24 hours: Temp:  [97.9 F (36.6 C)-98.4 F (36.9 C)] 97.9 F (36.6 C) (09/24 0452) Pulse Rate:  [89-96] 89 (09/24 0452) Resp:  [18] 18 (09/24 0452) BP: (107-111)/(57-70) 107/65 mmHg (09/24 0452) SpO2:  [99 %-100 %] 100 % (09/24 0452) Last BM Date: 08/07/15  Intake/Output from previous day: 09/23 0701 - 09/24 0700 In: 1693.3 [P.O.:480; I.V.:1213.3] Out: -  Intake/Output this shift:    Lab Results:   Recent Labs  08/05/15 0449 08/07/15 0647  WBC 6.2 6.7  HGB 7.9* 8.0*  HCT 23.7* 24.7*  PLT 410* 516*   BMET  Recent Labs  08/06/15 0540 08/07/15 0647  NA 139 140  K 3.2* 3.7  CL 104 103  CO2 26 28  GLUCOSE 139* 113*  BUN <5* 5*  CREATININE 1.17 1.05  CALCIUM 6.4* 6.8*   PT/INR No results for input(s): LABPROT, INR in the last 72 hours. Comprehensive Metabolic Panel:    Component Value Date/Time   NA 140 08/07/2015 0647   NA 139 08/06/2015 0540   K 3.7 08/07/2015 0647   K 3.2* 08/06/2015 0540   CL 103 08/07/2015 0647   CL 104 08/06/2015 0540   CO2 28 08/07/2015 0647   CO2 26 08/06/2015 0540   BUN 5* 08/07/2015 0647   BUN <5* 08/06/2015 0540   CREATININE 1.05 08/07/2015 0647   CREATININE 1.17 08/06/2015 0540   GLUCOSE 113* 08/07/2015 0647   GLUCOSE 139* 08/06/2015 0540   CALCIUM 6.8* 08/07/2015 0647   CALCIUM 6.4* 08/06/2015 0540   AST 14* 08/07/2015 0647   AST 15 08/05/2015 0449   ALT 7* 08/07/2015 0647   ALT 7* 08/05/2015 0449   ALKPHOS 53 08/07/2015 0647   ALKPHOS 47 08/05/2015 0449   BILITOT 0.2* 08/07/2015 0647   BILITOT 0.1* 08/05/2015 0449   PROT 5.8* 08/07/2015 0647   PROT 5.6* 08/05/2015 0449   ALBUMIN 2.1* 08/07/2015 0647   ALBUMIN 2.1* 08/05/2015 0449    Studies/Results: No results  found.  Anti-infectives: Anti-infectives    Start     Dose/Rate Route Frequency Ordered Stop   07/23/15 2200  cefoTEtan (CEFOTAN) 2 g in dextrose 5 % 50 mL IVPB     2 g 100 mL/hr over 30 Minutes Intravenous Every 12 hours 07/23/15 1734 07/23/15 2234   07/23/15 0742  cefoTEtan (CEFOTAN) 2 g in dextrose 5 % 50 mL IVPB     2 g 100 mL/hr over 30 Minutes Intravenous On call to O.R. 07/23/15 0742 07/23/15 1043      Assessment & Plans: Status post ileostomy closure Crohn's disease Hypokalemia Hypocalcemia  Potassium level now normal at 3.7  Calcium improved at 6.8 this morning   Anticipating discharge home on Monday per Dr. Hassell Done.  Earnstine Regal, MD, [facility] Office: [place] 08/07/2015

## 2015-08-08 LAB — GLUCOSE, CAPILLARY
GLUCOSE-CAPILLARY: 111 mg/dL — AB (ref 65–99)
GLUCOSE-CAPILLARY: 177 mg/dL — AB (ref 65–99)
Glucose-Capillary: 235 mg/dL — ABNORMAL HIGH (ref 65–99)
Glucose-Capillary: 144 mg/dL — ABNORMAL HIGH (ref 65–99)

## 2015-08-08 NOTE — Progress Notes (Signed)
Patient ID: [name], male   DOB: May 31, 1956, 59 y.o.   MRN: [id number]  [facility]  POD#: 16  Subjective: Patient in bed, no complaints this morning.  Ambulatory.  Objective: Vital signs in last 24 hours: Temp:  [98.1 F (36.7 C)-98.2 F (36.8 C)] 98.1 F (36.7 C) (09/25 0537) Pulse Rate:  [95-102] 100 (09/25 0537) Resp:  [18-20] 18 (09/25 0537) BP: (93-110)/(57-61) 101/57 mmHg (09/25 0537) SpO2:  [99 %-100 %] 99 % (09/25 0537) Last BM Date: 08/07/15  Intake/Output from previous day: 09/24 0701 - 09/25 0700 In: 1330 [P.O.:480; I.V.:850] Out: -  Intake/Output this shift:    Physical Exam: HEENT - sclerae clear, mucous membranes moist Neck - soft Abdomen - binder removed, dressing intact - changed this AM Ext - no edema, non-tender Neuro - alert & oriented, no focal deficits  Lab Results:   Recent Labs  08/07/15 0647  WBC 6.7  HGB 8.0*  HCT 24.7*  PLT 516*   BMET  Recent Labs  08/06/15 0540 08/07/15 0647  NA 139 140  K 3.2* 3.7  CL 104 103  CO2 26 28  GLUCOSE 139* 113*  BUN <5* 5*  CREATININE 1.17 1.05  CALCIUM 6.4* 6.8*   PT/INR No results for input(s): LABPROT, INR in the last 72 hours. Comprehensive Metabolic Panel:    Component Value Date/Time   NA 140 08/07/2015 0647   NA 139 08/06/2015 0540   K 3.7 08/07/2015 0647   K 3.2* 08/06/2015 0540   CL 103 08/07/2015 0647   CL 104 08/06/2015 0540   CO2 28 08/07/2015 0647   CO2 26 08/06/2015 0540   BUN 5* 08/07/2015 0647   BUN <5* 08/06/2015 0540   CREATININE 1.05 08/07/2015 0647   CREATININE 1.17 08/06/2015 0540   GLUCOSE 113* 08/07/2015 0647   GLUCOSE 139* 08/06/2015 0540   CALCIUM 6.8* 08/07/2015 0647   CALCIUM 6.4* 08/06/2015 0540   AST 14* 08/07/2015 0647   AST 15 08/05/2015 0449   ALT 7* 08/07/2015 0647   ALT 7* 08/05/2015 0449   ALKPHOS 53 08/07/2015 0647   ALKPHOS 47 08/05/2015 0449   BILITOT 0.2* 08/07/2015 0647   BILITOT 0.1*  08/05/2015 0449   PROT 5.8* 08/07/2015 0647   PROT 5.6* 08/05/2015 0449   ALBUMIN 2.1* 08/07/2015 0647   ALBUMIN 2.1* 08/05/2015 0449    Studies/Results: No results found.  Anti-infectives: Anti-infectives    Start     Dose/Rate Route Frequency Ordered Stop   07/23/15 2200  cefoTEtan (CEFOTAN) 2 g in dextrose 5 % 50 mL IVPB     2 g 100 mL/hr over 30 Minutes Intravenous Every 12 hours 07/23/15 1734 07/23/15 2234   07/23/15 0742  cefoTEtan (CEFOTAN) 2 g in dextrose 5 % 50 mL IVPB     2 g 100 mL/hr over 30 Minutes Intravenous On call to O.R. 07/23/15 0742 07/23/15 1043      Assessment & Plans: Status post ileostomy closure Crohn's disease Hypokalemia Hypocalcemia  Anticipating discharge home on Monday per Dr. Hassell Done.  Earnstine Regal, MD, [facility] Office: [place] 08/08/2015

## 2015-08-09 LAB — GLUCOSE, CAPILLARY
Glucose-Capillary: 99 mg/dL (ref 65–99)
Glucose-Capillary: 256 mg/dL — ABNORMAL HIGH (ref 65–99)
Glucose-Capillary: 159 mg/dL — ABNORMAL HIGH (ref 65–99)
Glucose-Capillary: 147 mg/dL — ABNORMAL HIGH (ref 65–99)

## 2015-08-09 NOTE — Progress Notes (Signed)
Spoke with pt concerning discharge needs. MD will need [facility] orders for RW, HHRN/PT and face to face please.

## 2015-08-09 NOTE — Progress Notes (Signed)
CSW received referral to Please assess home needs for durable medical equipment.  Inappropriate CSW referral.   CSW notified RNCM of home needs needing to be assessed.   No further social work needs identified at this time.  CSW signing off.  Alison Murray, MSW, [place] [facility] [phone]

## 2015-08-09 NOTE — Progress Notes (Signed)
Completed dressing change. Moderate amount of drainage on ABD pad over staples. Tissue is very red at previous ostomy site. Patient tolerated well. Will continue to monitor.

## 2015-08-09 NOTE — Progress Notes (Signed)
Patient ID: [name], male   DOB: [birth year], 59 y.o.   MRN: [id number] [facility] Progress Note:   17 Days Post-Op  Subjective: Mental status is clear Objective: Vital signs in last 24 hours: Temp:  [98 F (36.7 C)-98.8 F (37.1 C)] 98 F (36.7 C) (09/26 0454) Pulse Rate:  [96-104] 96 (09/26 0454) Resp:  [18-20] 18 (09/26 0454) BP: (98-115)/(55-61) 98/55 mmHg (09/26 0454) SpO2:  [98 %-100 %] 98 % (09/26 0454)  Intake/Output from previous day: 09/25 0701 - 09/26 0700 In: 755 [I.V.:755] Out: -  Intake/Output this shift:    Physical Exam: Work of breathing is normal.  Incision is draining less.    Lab Results:  Results for orders placed or performed during the hospital encounter of 07/23/15 (from the past 48 hour(s))  Glucose, capillary     Status: Abnormal   Collection Time: 08/07/15  1:23 PM  Result Value Ref Range   Glucose-Capillary 182 (H) 65 - 99 mg/dL  Glucose, capillary     Status: Abnormal   Collection Time: 08/07/15  5:07 PM  Result Value Ref Range   Glucose-Capillary 170 (H) 65 - 99 mg/dL  Glucose, capillary     Status: Abnormal   Collection Time: 08/07/15  9:10 PM  Result Value Ref Range   Glucose-Capillary 139 (H) 65 - 99 mg/dL  Glucose, capillary     Status: Abnormal   Collection Time: 08/08/15  7:58 AM  Result Value Ref Range   Glucose-Capillary 111 (H) 65 - 99 mg/dL   Comment 1 Notify RN    Comment 2 Document in Chart   Glucose, capillary     Status: Abnormal   Collection Time: 08/08/15 12:16 PM  Result Value Ref Range   Glucose-Capillary 235 (H) 65 - 99 mg/dL   Comment 1 Notify RN    Comment 2 Document in Chart   Glucose, capillary     Status: Abnormal   Collection Time: 08/08/15  5:16 PM  Result Value Ref Range   Glucose-Capillary 144 (H) 65 - 99 mg/dL   Comment 1 Notify RN    Comment 2 Document in Chart   Glucose, capillary     Status: Abnormal   Collection Time: 08/08/15 10:04 PM  Result Value Ref Range   Glucose-Capillary 177 (H) 65 - 99 mg/dL  Glucose, capillary     Status: None   Collection Time: 08/09/15  7:35 AM  Result Value Ref Range   Glucose-Capillary 99 65 - 99 mg/dL    Radiology/Results: No results found.  Anti-infectives: Anti-infectives    Start     Dose/Rate Route Frequency Ordered Stop   07/23/15 2200  cefoTEtan (CEFOTAN) 2 g in dextrose 5 % 50 mL IVPB     2 g 100 mL/hr over 30 Minutes Intravenous Every 12 hours 07/23/15 1734 07/23/15 2234   07/23/15 0742  cefoTEtan (CEFOTAN) 2 g in dextrose 5 % 50 mL IVPB     2 g 100 mL/hr over 30 Minutes Intravenous On call to O.R. 07/23/15 0742 07/23/15 1043      Assessment/Plan: Problem List: Patient Active Problem List   Diagnosis Date Noted  . Crohn's disease 07/23/2015    PO intake better.  Will recheck labs tomorrow.  Hopeful discharge this week.   17 Days Post-Op    LOS: 17 days   [name] Done, MD, [facility] [phone] beeper [phone]  08/09/2015 10:05 AM

## 2015-08-09 NOTE — Progress Notes (Signed)
Received report from Dana RN. Resumed care of patient. Agree with previous assessment. Will continue to monitor. 

## 2015-08-10 LAB — COMPREHENSIVE METABOLIC PANEL
ALT: 7 U/L — ABNORMAL LOW (ref 17–63)
ANION GAP: 9 (ref 5–15)
AST: 15 U/L (ref 15–41)
Albumin: 2.2 g/dL — ABNORMAL LOW (ref 3.5–5.0)
Alkaline Phosphatase: 56 U/L (ref 38–126)
BUN: 7 mg/dL (ref 6–20)
CHLORIDE: 105 mmol/L (ref 101–111)
CO2: 27 mmol/L (ref 22–32)
Calcium: 6.7 mg/dL — ABNORMAL LOW (ref 8.9–10.3)
Creatinine, Ser: 0.95 mg/dL (ref 0.61–1.24)
GFR calc non Af Amer: >60 mL/min (ref >60)
Glucose, Bld: 127 mg/dL — ABNORMAL HIGH (ref 65–99)
POTASSIUM: 4 mmol/L (ref 3.5–5.1)
SODIUM: 141 mmol/L (ref 135–145)
Total Bilirubin: 0.3 mg/dL (ref 0.3–1.2)
Total Protein: 5.9 g/dL — ABNORMAL LOW (ref 6.5–8.1)

## 2015-08-10 LAB — CBC WITH DIFFERENTIAL/PLATELET
Basophils Absolute: 0 10*3/uL (ref 0.0–0.1)
Basophils Relative: 0 %
EOS ABS: 0.1 10*3/uL (ref 0.0–0.7)
EOS PCT: 2 %
HCT: 25.7 % — ABNORMAL LOW (ref 39.0–52.0)
Hemoglobin: 8.1 g/dL — ABNORMAL LOW (ref 13.0–17.0)
LYMPHS ABS: 1.2 10*3/uL (ref 0.7–4.0)
Lymphocytes Relative: 19 %
MCH: 30.1 pg (ref 26.0–34.0)
MCHC: 31.5 g/dL (ref 30.0–36.0)
MCV: 95.5 fL (ref 78.0–100.0)
MONOS PCT: 13 %
Monocytes Absolute: 0.8 10*3/uL (ref 0.1–1.0)
Neutro Abs: 4 10*3/uL (ref 1.7–7.7)
Neutrophils Relative %: 66 %
PLATELETS: 476 10*3/uL — AB (ref 150–400)
RBC: 2.69 MIL/uL — AB (ref 4.22–5.81)
RDW: 14.5 % (ref 11.5–15.5)
WBC: 6.1 10*3/uL (ref 4.0–10.5)

## 2015-08-10 LAB — GLUCOSE, CAPILLARY
GLUCOSE-CAPILLARY: 226 mg/dL — AB (ref 65–99)
GLUCOSE-CAPILLARY: 157 mg/dL — AB (ref 65–99)
Glucose-Capillary: 105 mg/dL — ABNORMAL HIGH (ref 65–99)
Glucose-Capillary: 146 mg/dL — ABNORMAL HIGH (ref 65–99)

## 2015-08-10 NOTE — Clinical Documentation Improvement (Signed)
General Surgery  Abnormal Lab/Test Results:   H/H: 9/27:  8.1/25.7. 9/14:  9.3/27.6. 9/11:  10.2/30.4. 9/10:  12.0/36.1.  Possible Clinical Conditions associated with below indicators  Acute Blood Loss Anemia  Acute on Chronic Blood Loss Anemia  Other Condition  Cannot Clinically Determine   Treatment Provided: Per 9/09 Anesthesia record: Albumin: 250 ml.  Please exercise your independent, professional judgment when responding. A specific answer is not anticipated or expected.   Thank You,  [place] [phone]

## 2015-08-10 NOTE — Progress Notes (Signed)
Patient ID: [name], male   DOB: Nov 29, 1955, 59 y.o.   MRN: [id number] [facility] Progress Note:   18 Days Post-Op  Subjective: Mental status is clear.  Had some pain last night.  Concerned that he is not ready to go home Objective: Vital signs in last 24 hours: Temp:  [98.1 F (36.7 C)-98.9 F (37.2 C)] 98.5 F (36.9 C) (09/27 0445) Pulse Rate:  [106-107] 106 (09/27 0445) Resp:  [16] 16 (09/27 0445) BP: (105-122)/(61-64) 105/61 mmHg (09/27 0445) SpO2:  [99 %-100 %] 100 % (09/27 0445)  Intake/Output from previous day: 09/26 0701 - 09/27 0700 In: 1405 [P.O.:360; I.V.:1045] Out: -  Intake/Output this shift:    Physical Exam: Work of breathing is not labored.  Taking POs  Lab Results:  Results for orders placed or performed during the hospital encounter of 07/23/15 (from the past 48 hour(s))  Glucose, capillary     Status: Abnormal   Collection Time: 08/08/15 12:16 PM  Result Value Ref Range   Glucose-Capillary 235 (H) 65 - 99 mg/dL   Comment 1 Notify RN    Comment 2 Document in Chart   Glucose, capillary     Status: Abnormal   Collection Time: 08/08/15  5:16 PM  Result Value Ref Range   Glucose-Capillary 144 (H) 65 - 99 mg/dL   Comment 1 Notify RN    Comment 2 Document in Chart   Glucose, capillary     Status: Abnormal   Collection Time: 08/08/15 10:04 PM  Result Value Ref Range   Glucose-Capillary 177 (H) 65 - 99 mg/dL  Glucose, capillary     Status: None   Collection Time: 08/09/15  7:35 AM  Result Value Ref Range   Glucose-Capillary 99 65 - 99 mg/dL  Glucose, capillary     Status: Abnormal   Collection Time: 08/09/15 12:13 PM  Result Value Ref Range   Glucose-Capillary 256 (H) 65 - 99 mg/dL  Glucose, capillary     Status: Abnormal   Collection Time: 08/09/15  5:14 PM  Result Value Ref Range   Glucose-Capillary 159 (H) 65 - 99 mg/dL   Comment 1 Notify RN    Comment 2 Document in Chart   Glucose, capillary     Status: Abnormal   Collection  Time: 08/09/15  9:18 PM  Result Value Ref Range   Glucose-Capillary 147 (H) 65 - 99 mg/dL  CBC with Differential/Platelet     Status: Abnormal   Collection Time: 08/10/15  5:37 AM  Result Value Ref Range   WBC 6.1 4.0 - 10.5 K/uL   RBC 2.69 (L) 4.22 - 5.81 MIL/uL   Hemoglobin 8.1 (L) 13.0 - 17.0 g/dL   HCT 25.7 (L) 39.0 - 52.0 %   MCV 95.5 78.0 - 100.0 fL   MCH 30.1 26.0 - 34.0 pg   MCHC 31.5 30.0 - 36.0 g/dL   RDW 14.5 11.5 - 15.5 %   Platelets 476 (H) 150 - 400 K/uL   Neutrophils Relative % 66 %   Neutro Abs 4.0 1.7 - 7.7 K/uL   Lymphocytes Relative 19 %   Lymphs Abs 1.2 0.7 - 4.0 K/uL   Monocytes Relative 13 %   Monocytes Absolute 0.8 0.1 - 1.0 K/uL   Eosinophils Relative 2 %   Eosinophils Absolute 0.1 0.0 - 0.7 K/uL   Basophils Relative 0 %   Basophils Absolute 0.0 0.0 - 0.1 K/uL  Comprehensive metabolic panel     Status: Abnormal   Collection  Time: 08/10/15  5:37 AM  Result Value Ref Range   Sodium 141 135 - 145 mmol/L   Potassium 4.0 3.5 - 5.1 mmol/L   Chloride 105 101 - 111 mmol/L   CO2 27 22 - 32 mmol/L   Glucose, Bld 127 (H) 65 - 99 mg/dL   BUN 7 6 - 20 mg/dL   Creatinine, Ser 0.95 0.61 - 1.24 mg/dL   Calcium 6.7 (L) 8.9 - 10.3 mg/dL   Total Protein 5.9 (L) 6.5 - 8.1 g/dL   Albumin 2.2 (L) 3.5 - 5.0 g/dL   AST 15 15 - 41 U/L   ALT 7 (L) 17 - 63 U/L   Alkaline Phosphatase 56 38 - 126 U/L   Total Bilirubin 0.3 0.3 - 1.2 mg/dL   GFR calc non Af Amer >60 >60 mL/min   GFR calc Af Amer >60 >60 mL/min    Comment: (NOTE) The eGFR has been calculated using the CKD EPI equation. This calculation has not been validated in all clinical situations. eGFR's persistently <60 mL/min signify possible Chronic Kidney Disease.    Anion gap 9 5 - 15  Glucose, capillary     Status: Abnormal   Collection Time: 08/10/15  7:49 AM  Result Value Ref Range   Glucose-Capillary 105 (H) 65 - 99 mg/dL    Radiology/Results: No results found.  Anti-infectives: Anti-infectives     Start     Dose/Rate Route Frequency Ordered Stop   07/23/15 2200  cefoTEtan (CEFOTAN) 2 g in dextrose 5 % 50 mL IVPB     2 g 100 mL/hr over 30 Minutes Intravenous Every 12 hours 07/23/15 1734 07/23/15 2234   07/23/15 0742  cefoTEtan (CEFOTAN) 2 g in dextrose 5 % 50 mL IVPB     2 g 100 mL/hr over 30 Minutes Intravenous On call to O.R. 07/23/15 0742 07/23/15 1043      Assessment/Plan: Problem List: Patient Active Problem List   Diagnosis Date Noted  . Crohn's disease 07/23/2015    Post takedown of ileostomy 18 Days Post-Op    LOS: 18 days   [name] Done, MD, [facility] [phone] beeper [phone]  08/10/2015 11:27 AM

## 2015-08-11 LAB — GLUCOSE, CAPILLARY
GLUCOSE-CAPILLARY: 111 mg/dL — AB (ref 65–99)
GLUCOSE-CAPILLARY: 155 mg/dL — AB (ref 65–99)
GLUCOSE-CAPILLARY: 163 mg/dL — AB (ref 65–99)

## 2015-08-11 MED ORDER — DIPHENHYDRAMINE HCL 25 MG PO CAPS
25.0000 mg | ORAL_CAPSULE | Freq: Four times a day (QID) | ORAL | Status: DC | PRN
  Administered 2015-08-11: 25 mg via ORAL
  Filled 2015-08-11: qty 1

## 2015-08-11 NOTE — Consult Note (Signed)
WOC wound consult note Reason for Consult:Midline surgical incision.  Closed with staples and blue sutures at distal end.   Ileostomy takedown to RMQ.   Wound type:Surgical site.  Midline abdomen Ileostomy takedown site.  100% clean, nongranulating wound bed Pressure Ulcer POA: N/A Measurement:RMQ Takedown site 4 cm x 6 cm x 2.2 cm 100% pink nongranulating Midline abdominal staple line with distal sutures 14 cm x 0.5 cm intact Wound UKG:URKYHC Drainage (amount, consistency, odor) Minimal serosanguinous drainage. No odor.  Periwound:Intact Dressing procedure/placement/frequency:Will treat conservatively at this time. Nutrition (protein intake) discussed with patient to promote healing.  Understands to wear abdominal binder as well.  Cleanse midline surgical line and RMQ ostomy takedown site with NS and pat gently dry.  Apply Aquacel AG to wound bed, cover with 4x4 gauze and ABD pad.  Change three times weekly.   Dry dressing to midline abdominal incision.  Change daily. Wear abdominal binder.  Will not follow at this time.  Please re-consult if needed.  Domenic Moras RN BSN [place] Pager [phone]

## 2015-08-12 LAB — GLUCOSE, CAPILLARY
GLUCOSE-CAPILLARY: 101 mg/dL — AB (ref 65–99)
GLUCOSE-CAPILLARY: 174 mg/dL — AB (ref 65–99)
GLUCOSE-CAPILLARY: 148 mg/dL — AB (ref 65–99)
GLUCOSE-CAPILLARY: 150 mg/dL — AB (ref 65–99)

## 2015-08-12 NOTE — Progress Notes (Signed)
Patient ID: [name], male   DOB: [birth year], 59 y.o.   MRN: [id number] [facility] Progress Note:   20 Days Post-Op  Subjective: Mental status is clear. Feeling better. Objective: Vital signs in last 24 hours: Temp:  [98.2 F (36.8 C)-98.6 F (37 C)] 98.2 F (36.8 C) (09/29 0519) Pulse Rate:  [92-105] 98 (09/29 0519) Resp:  [18] 18 (09/29 0519) BP: (100-115)/(62-70) 100/62 mmHg (09/29 0519) SpO2:  [97 %-100 %] 97 % (09/29 0519)  Intake/Output from previous day: 09/28 0701 - 09/29 0700 In: 1800 [I.V.:1800] Out: -  Intake/Output this shift:    Physical Exam: Work of breathing is normal.  Incisions unchanged;  Appreciated wound consult.    Lab Results:  Results for orders placed or performed during the hospital encounter of 07/23/15 (from the past 48 hour(s))  Glucose, capillary     Status: Abnormal   Collection Time: 08/10/15  4:40 PM  Result Value Ref Range   Glucose-Capillary 157 (H) 65 - 99 mg/dL  Glucose, capillary     Status: Abnormal   Collection Time: 08/10/15  9:22 PM  Result Value Ref Range   Glucose-Capillary 146 (H) 65 - 99 mg/dL  Glucose, capillary     Status: Abnormal   Collection Time: 08/11/15  7:36 AM  Result Value Ref Range   Glucose-Capillary 111 (H) 65 - 99 mg/dL  Glucose, capillary     Status: Abnormal   Collection Time: 08/11/15  4:57 PM  Result Value Ref Range   Glucose-Capillary 155 (H) 65 - 99 mg/dL  Glucose, capillary     Status: Abnormal   Collection Time: 08/11/15  9:11 PM  Result Value Ref Range   Glucose-Capillary 163 (H) 65 - 99 mg/dL  Glucose, capillary     Status: Abnormal   Collection Time: 08/12/15  7:33 AM  Result Value Ref Range   Glucose-Capillary 101 (H) 65 - 99 mg/dL  Glucose, capillary     Status: Abnormal   Collection Time: 08/12/15 11:57 AM  Result Value Ref Range   Glucose-Capillary 174 (H) 65 - 99 mg/dL    Radiology/Results: No results found.  Anti-infectives: Anti-infectives    Start      Dose/Rate Route Frequency Ordered Stop   07/23/15 2200  cefoTEtan (CEFOTAN) 2 g in dextrose 5 % 50 mL IVPB     2 g 100 mL/hr over 30 Minutes Intravenous Every 12 hours 07/23/15 1734 07/23/15 2234   07/23/15 0742  cefoTEtan (CEFOTAN) 2 g in dextrose 5 % 50 mL IVPB     2 g 100 mL/hr over 30 Minutes Intravenous On call to O.R. 07/23/15 0742 07/23/15 1043      Assessment/Plan: Problem List: Patient Active Problem List   Diagnosis Date Noted  . Crohn's disease 07/23/2015   He has had hypocalcemia, hypokalemia and hypoalbuminemia.  Will repeat labs before discharge.   Will plan discharge tomorrow.   20 Days Post-Op    LOS: 20 days   [name] Done, MD, [facility] [phone] beeper [phone]  08/12/2015 12:43 PM

## 2015-08-13 LAB — COMPREHENSIVE METABOLIC PANEL
ALT: 6 U/L — ABNORMAL LOW (ref 17–63)
ANION GAP: 7 (ref 5–15)
AST: 16 U/L (ref 15–41)
Albumin: 2 g/dL — ABNORMAL LOW (ref 3.5–5.0)
Alkaline Phosphatase: 52 U/L (ref 38–126)
BILIRUBIN TOTAL: 0.2 mg/dL — AB (ref 0.3–1.2)
BUN: 7 mg/dL (ref 6–20)
CALCIUM: 6.9 mg/dL — AB (ref 8.9–10.3)
CO2: 25 mmol/L (ref 22–32)
Chloride: 108 mmol/L (ref 101–111)
Creatinine, Ser: 0.92 mg/dL (ref 0.61–1.24)
GFR calc non Af Amer: >60 mL/min (ref >60)
GLUCOSE: 111 mg/dL — AB (ref 65–99)
POTASSIUM: 4.1 mmol/L (ref 3.5–5.1)
SODIUM: 140 mmol/L (ref 135–145)
TOTAL PROTEIN: 5.5 g/dL — AB (ref 6.5–8.1)

## 2015-08-13 LAB — CBC WITH DIFFERENTIAL/PLATELET
BASOS PCT: 0 %
Basophils Absolute: 0 10*3/uL (ref 0.0–0.1)
EOS ABS: 0.2 10*3/uL (ref 0.0–0.7)
Eosinophils Relative: 3 %
HCT: 24.3 % — ABNORMAL LOW (ref 39.0–52.0)
Hemoglobin: 7.9 g/dL — ABNORMAL LOW (ref 13.0–17.0)
LYMPHS ABS: 1 10*3/uL (ref 0.7–4.0)
Lymphocytes Relative: 22 %
MCH: 31.2 pg (ref 26.0–34.0)
MCHC: 32.5 g/dL (ref 30.0–36.0)
MCV: 96 fL (ref 78.0–100.0)
MONO ABS: 0.5 10*3/uL (ref 0.1–1.0)
MONOS PCT: 11 %
Neutro Abs: 2.8 10*3/uL (ref 1.7–7.7)
Neutrophils Relative %: 64 %
Platelets: 365 10*3/uL (ref 150–400)
RBC: 2.53 MIL/uL — ABNORMAL LOW (ref 4.22–5.81)
RDW: 14.2 % (ref 11.5–15.5)
WBC: 4.4 10*3/uL (ref 4.0–10.5)

## 2015-08-13 LAB — GLUCOSE, CAPILLARY
GLUCOSE-CAPILLARY: 172 mg/dL — AB (ref 65–99)
Glucose-Capillary: 98 mg/dL (ref 65–99)

## 2015-08-13 MED ORDER — OXYCODONE-ACETAMINOPHEN 5-325 MG PO TABS: 1.0000 | ORAL_TABLET | ORAL | Status: DC | PRN

## 2015-08-13 NOTE — Discharge Instructions (Signed)
Cleanse midline surgical line and RMQ ostomy takedown site with NS and pat gently dry. Apply Aquacel AG to wound bed, cover with 4x4 gauze and ABD pad. Change three times weekly. Dry dressing to midline abdominal incision. Change daily. Wear abdominal binder.

## 2015-08-13 NOTE — Progress Notes (Signed)
Patient ID: [name], male   DOB: 11/29/1955, 59 y.o.   MRN: [id number] [facility] Surgery Progress Note:   21 Days Post-Op  Subjective: Mental status is clear.  Feels like he can go home.  Will arrange for DME etc Objective: Vital signs in last 24 hours: Temp:  [98.4 F (36.9 C)-99 F (37.2 C)] 98.4 F (36.9 C) (09/30 0513) Pulse Rate:  [93-99] 93 (09/30 0513) Resp:  [18] 18 (09/30 0513) BP: (109-119)/(63) 109/63 mmHg (09/30 0513) SpO2:  [98 %-100 %] 98 % (09/30 0513)  Intake/Output from previous day: 09/29 0701 - 09/30 0700 In: 2565 [P.O.:840; I.V.:1725] Out: -  Intake/Output this shift:    Physical Exam: Work of breathing is not elevated.  Incisions having dressings changed  Lab Results:  Results for orders placed or performed during the hospital encounter of 07/23/15 (from the past 48 hour(s))  Glucose, capillary     Status: Abnormal   Collection Time: 08/11/15  4:57 PM  Result Value Ref Range   Glucose-Capillary 155 (H) 65 - 99 mg/dL  Glucose, capillary     Status: Abnormal   Collection Time: 08/11/15  9:11 PM  Result Value Ref Range   Glucose-Capillary 163 (H) 65 - 99 mg/dL  Glucose, capillary     Status: Abnormal   Collection Time: 08/12/15  7:33 AM  Result Value Ref Range   Glucose-Capillary 101 (H) 65 - 99 mg/dL  Glucose, capillary     Status: Abnormal   Collection Time: 08/12/15 11:57 AM  Result Value Ref Range   Glucose-Capillary 174 (H) 65 - 99 mg/dL  Glucose, capillary     Status: Abnormal   Collection Time: 08/12/15  4:42 PM  Result Value Ref Range   Glucose-Capillary 148 (H) 65 - 99 mg/dL  Glucose, capillary     Status: Abnormal   Collection Time: 08/12/15  9:01 PM  Result Value Ref Range   Glucose-Capillary 150 (H) 65 - 99 mg/dL  CBC with Differential/Platelet     Status: Abnormal   Collection Time: 08/13/15  5:32 AM  Result Value Ref Range   WBC 4.4 4.0 - 10.5 K/uL   RBC 2.53 (L) 4.22 - 5.81 MIL/uL   Hemoglobin 7.9 (L) 13.0 - 17.0  g/dL   HCT 24.3 (L) 39.0 - 52.0 %   MCV 96.0 78.0 - 100.0 fL   MCH 31.2 26.0 - 34.0 pg   MCHC 32.5 30.0 - 36.0 g/dL   RDW 14.2 11.5 - 15.5 %   Platelets 365 150 - 400 K/uL   Neutrophils Relative % 64 %   Neutro Abs 2.8 1.7 - 7.7 K/uL   Lymphocytes Relative 22 %   Lymphs Abs 1.0 0.7 - 4.0 K/uL   Monocytes Relative 11 %   Monocytes Absolute 0.5 0.1 - 1.0 K/uL   Eosinophils Relative 3 %   Eosinophils Absolute 0.2 0.0 - 0.7 K/uL   Basophils Relative 0 %   Basophils Absolute 0.0 0.0 - 0.1 K/uL  Comprehensive metabolic panel     Status: Abnormal   Collection Time: 08/13/15  5:32 AM  Result Value Ref Range   Sodium 140 135 - 145 mmol/L   Potassium 4.1 3.5 - 5.1 mmol/L   Chloride 108 101 - 111 mmol/L   CO2 25 22 - 32 mmol/L   Glucose, Bld 111 (H) 65 - 99 mg/dL   BUN 7 6 - 20 mg/dL   Creatinine, Ser 0.92 0.61 - 1.24 mg/dL   Calcium 6.9 (L) 8.9 -  10.3 mg/dL   Total Protein 5.5 (L) 6.5 - 8.1 g/dL   Albumin 2.0 (L) 3.5 - 5.0 g/dL   AST 16 15 - 41 U/L   ALT 6 (L) 17 - 63 U/L   Alkaline Phosphatase 52 38 - 126 U/L   Total Bilirubin 0.2 (L) 0.3 - 1.2 mg/dL   GFR calc non Af Amer >60 >60 mL/min   GFR calc Af Amer >60 >60 mL/min    Comment: (NOTE) The eGFR has been calculated using the CKD EPI equation. This calculation has not been validated in all clinical situations. eGFR's persistently <60 mL/min signify possible Chronic Kidney Disease.    Anion gap 7 5 - 15  Glucose, capillary     Status: None   Collection Time: 08/13/15  7:47 AM  Result Value Ref Range   Glucose-Capillary 98 65 - 99 mg/dL    Radiology/Results: No results found.  Anti-infectives: Anti-infectives    Start     Dose/Rate Route Frequency Ordered Stop   07/23/15 2200  cefoTEtan (CEFOTAN) 2 g in dextrose 5 % 50 mL IVPB     2 g 100 mL/hr over 30 Minutes Intravenous Every 12 hours 07/23/15 1734 07/23/15 2234   07/23/15 0742  cefoTEtan (CEFOTAN) 2 g in dextrose 5 % 50 mL IVPB     2 g 100 mL/hr over 30 Minutes  Intravenous On call to O.R. 07/23/15 0742 07/23/15 1043      Assessment/Plan: Problem List: Patient Active Problem List   Diagnosis Date Noted  . Crohn's disease 07/23/2015   He still has hypoalbumnemia and hypocalcemia.  He has anemia of chronic disease with acute blood loss anemia on top of that.  He has been taking enteral nutrients but his recovery has been slow.  His wound needs have been very unusual since her has very little between his small bowel and the outside except a thin piece of scar/skin and an underlying piece of Strattice mesh.    Arrangements made for discharge 21 Days Post-Op    LOS: 21 days   [name] Done, MD, [facility] [phone] beeper [phone]  08/13/2015 8:32 AM

## 2015-08-13 NOTE — Discharge Summary (Signed)
Physician Discharge Summary  Patient ID: [name] MRN: [id number] DOB/AGE: 12-Feb-1956 59 y.o.  Admit date: 07/23/2015 Discharge date: 08/13/2015  Admission Diagnoses:  Longstanding Crohn's with ileostomy, large ventral hernia  Discharge Diagnoses:  Same post takedown of ileostomy and management of ventral hernia  Active Problems:   Crohn's disease   Surgery:  Open enterolysis, takedown of ileostomy with ileo transverse anastomosis and repair of large ventral hernia with Strattice mesh  Discharged Condition: improved but weak  Hospital Course:   Had 5 hour surgery.  Kept in stepdown for a few days and then to telemetry.  Anticipated wound problems and healing anastomosis but did well.  Hypoalbuminemia and acute blood loss anemia.  Had hypocalcemia and hypokalemia  Treated with oral intake.  Wound healing ok and staples and sutures left in place for the time being.  Consults: wound and ostomy nursing  Significant Diagnostic Studies: lab see below    Discharge Exam: Blood pressure 109/63, pulse 93, temperature 98.4 F (36.9 C), temperature source Oral, resp. rate 18, height 5\' 6"  (1.676 m), weight 76.5 kg (168 lb 10.4 oz), SpO2 98 %. Incisions healing-ileostomy site packed and pink.  Midline appoximated with less discharge.  Arrangements made for home health .   Disposition: 01-Home or Self Care  Discharge Instructions    Call MD for:  persistant nausea and vomiting    Complete by:  As directed      Call MD for:  severe uncontrolled pain    Complete by:  As directed      Diet - low sodium heart healthy    Complete by:  As directed      Discharge wound care:    Complete by:  As directed   Cleanse midline surgical line and RMQ ostomy takedown site with NS and pat gently dry. Apply Aquacel AG to wound bed, cover with 4x4 gauze and ABD pad. Change three times weekly. Dry dressing to midline abdominal incision. Change daily. Wear abdominal binder.     Increase activity  slowly    Complete by:  As directed             Medication List    TAKE these medications        docusate sodium 100 MG capsule  Commonly known as:  COLACE  Take 1 capsule (100 mg total) by mouth 3 (three) times daily as needed.     oxyCODONE-acetaminophen 5-325 MG tablet  Commonly known as:  ROXICET  Take 1-2 tablets by mouth every 4 (four) hours as needed for severe pain.     oxyCODONE-acetaminophen 5-325 MG tablet  Commonly known as:  ROXICET  Take 1 tablet by mouth every 4 (four) hours as needed for severe pain.     potassium citrate 10 MEQ (1080 MG) SR tablet  Commonly known as:  UROCIT-K  Take 40 mEq by mouth 4 (four) times daily.           Follow-up Information    Follow up with Pedro Earls, MD.   Specialty:  General Surgery   Contact information:   [place] [id number] [phone]       Signed: Pedro Earls 08/13/2015, 8:57 AM

## 2017-01-01 ENCOUNTER — Other Ambulatory Visit: Payer: Self-pay | Admitting: General Surgery

## 2017-01-01 DIAGNOSIS — R109 Unspecified abdominal pain: Secondary | ICD-10-CM

## 2017-01-02 ENCOUNTER — Ambulatory Visit
Admission: RE | Admit: 2017-01-02 | Discharge: 2017-01-02 | Disposition: A | Discharge status: Home / Self Care | Payer: 59 | Source: Ambulatory Visit | Attending: General Surgery | Admitting: General Surgery

## 2017-01-02 DIAGNOSIS — R109 Unspecified abdominal pain: Secondary | ICD-10-CM

## 2017-01-02 MED ORDER — IOPAMIDOL (ISOVUE-300) INJECTION 61%
100.0000 mL | Freq: Once | INTRAVENOUS | Status: AC | PRN
  Administered 2017-01-02: 100 mL via INTRAVENOUS

## 2017-01-03 ENCOUNTER — Inpatient Hospital Stay (HOSPITAL_COMMUNITY): Payer: 59 | Admitting: Anesthesiology

## 2017-01-03 ENCOUNTER — Encounter (HOSPITAL_COMMUNITY): Admission: AD | Disposition: A | Discharge status: Home Health | Payer: Self-pay | Source: Ambulatory Visit

## 2017-01-03 ENCOUNTER — Inpatient Hospital Stay (HOSPITAL_COMMUNITY)
Admission: AD | Admit: 2017-01-03 | Discharge: 2017-01-12 | DRG: 357 | Disposition: A | Discharge status: Home Health | Payer: 59 | Source: Ambulatory Visit | Attending: Surgery | Admitting: Surgery

## 2017-01-03 ENCOUNTER — Other Ambulatory Visit: Payer: Self-pay | Admitting: Surgery

## 2017-01-03 ENCOUNTER — Ambulatory Visit: Payer: Self-pay | Admitting: Surgery

## 2017-01-03 ENCOUNTER — Encounter (HOSPITAL_COMMUNITY): Payer: Self-pay

## 2017-01-03 DIAGNOSIS — H5712 Ocular pain, left eye: Secondary | ICD-10-CM | POA: Diagnosis present

## 2017-01-03 DIAGNOSIS — N400 Enlarged prostate without lower urinary tract symptoms: Secondary | ICD-10-CM | POA: Diagnosis present

## 2017-01-03 DIAGNOSIS — E1165 Type 2 diabetes mellitus with hyperglycemia: Secondary | ICD-10-CM | POA: Diagnosis present

## 2017-01-03 DIAGNOSIS — E119 Type 2 diabetes mellitus without complications: Secondary | ICD-10-CM | POA: Diagnosis not present

## 2017-01-03 DIAGNOSIS — K50914 Crohn's disease, unspecified, with abscess: Secondary | ICD-10-CM | POA: Diagnosis present

## 2017-01-03 DIAGNOSIS — Z820 Family history of epilepsy and other diseases of the nervous system: Secondary | ICD-10-CM

## 2017-01-03 DIAGNOSIS — Z87891 Personal history of nicotine dependence: Secondary | ICD-10-CM

## 2017-01-03 DIAGNOSIS — L02211 Cutaneous abscess of abdominal wall: Secondary | ICD-10-CM | POA: Diagnosis present

## 2017-01-03 DIAGNOSIS — N132 Hydronephrosis with renal and ureteral calculous obstruction: Secondary | ICD-10-CM | POA: Diagnosis present

## 2017-01-03 DIAGNOSIS — K632 Fistula of intestine: Secondary | ICD-10-CM

## 2017-01-03 DIAGNOSIS — E440 Moderate protein-calorie malnutrition: Secondary | ICD-10-CM | POA: Insufficient documentation

## 2017-01-03 DIAGNOSIS — E876 Hypokalemia: Secondary | ICD-10-CM | POA: Diagnosis present

## 2017-01-03 DIAGNOSIS — K50113 Crohn's disease of large intestine with fistula: Secondary | ICD-10-CM | POA: Diagnosis present

## 2017-01-03 DIAGNOSIS — K50114 Crohn's disease of large intestine with abscess: Secondary | ICD-10-CM | POA: Diagnosis not present

## 2017-01-03 HISTORY — DX: Cutaneous abscess of abdominal wall: L02.211

## 2017-01-03 HISTORY — PX: IRRIGATION AND DEBRIDEMENT ABSCESS: SHX5252

## 2017-01-03 LAB — COMPREHENSIVE METABOLIC PANEL
ALT: 10 U/L — AB (ref 17–63)
AST: 11 U/L — AB (ref 15–41)
Albumin: 2.6 g/dL — ABNORMAL LOW (ref 3.5–5.0)
Alkaline Phosphatase: 68 U/L (ref 38–126)
Anion gap: 7 (ref 5–15)
BUN: 13 mg/dL (ref 6–20)
CHLORIDE: 107 mmol/L (ref 101–111)
CO2: 25 mmol/L (ref 22–32)
CREATININE: 1.05 mg/dL (ref 0.61–1.24)
Calcium: 8.1 mg/dL — ABNORMAL LOW (ref 8.9–10.3)
GFR calc Af Amer: >60 mL/min (ref >60)
GLUCOSE: 90 mg/dL (ref 65–99)
Potassium: 2.2 mmol/L — CL (ref 3.5–5.1)
Sodium: 139 mmol/L (ref 135–145)
Total Bilirubin: 0.9 mg/dL (ref 0.3–1.2)
Total Protein: 7 g/dL (ref 6.5–8.1)

## 2017-01-03 LAB — PREALBUMIN: PREALBUMIN: 7.6 mg/dL — AB (ref 18–38)

## 2017-01-03 LAB — CBC WITH DIFFERENTIAL/PLATELET
Basophils Absolute: 0 10*3/uL (ref 0.0–0.1)
Basophils Relative: 0 %
EOS ABS: 0 10*3/uL (ref 0.0–0.7)
EOS PCT: 0 %
HCT: 33.6 % — ABNORMAL LOW (ref 39.0–52.0)
Hemoglobin: 11.2 g/dL — ABNORMAL LOW (ref 13.0–17.0)
LYMPHS ABS: 1.2 10*3/uL (ref 0.7–4.0)
Lymphocytes Relative: 12 %
MCH: 28.8 pg (ref 26.0–34.0)
MCHC: 33.3 g/dL (ref 30.0–36.0)
MCV: 86.4 fL (ref 78.0–100.0)
MONO ABS: 1 10*3/uL (ref 0.1–1.0)
MONOS PCT: 10 %
Neutro Abs: 7.9 10*3/uL — ABNORMAL HIGH (ref 1.7–7.7)
Neutrophils Relative %: 78 %
PLATELETS: 338 10*3/uL (ref 150–400)
RBC: 3.89 MIL/uL — ABNORMAL LOW (ref 4.22–5.81)
RDW: 15.8 % — AB (ref 11.5–15.5)
WBC: 10.1 10*3/uL (ref 4.0–10.5)

## 2017-01-03 LAB — CBC
HCT: 29.8 % — ABNORMAL LOW (ref 39.0–52.0)
HEMOGLOBIN: 9.9 g/dL — AB (ref 13.0–17.0)
MCH: 28.2 pg (ref 26.0–34.0)
MCHC: 33.2 g/dL (ref 30.0–36.0)
MCV: 84.9 fL (ref 78.0–100.0)
PLATELETS: 297 10*3/uL (ref 150–400)
RBC: 3.51 MIL/uL — AB (ref 4.22–5.81)
RDW: 15.7 % — ABNORMAL HIGH (ref 11.5–15.5)
WBC: 9.7 10*3/uL (ref 4.0–10.5)

## 2017-01-03 LAB — CREATININE, SERUM
CREATININE: 0.86 mg/dL (ref 0.61–1.24)
GFR calc non Af Amer: >60 mL/min (ref >60)

## 2017-01-03 SURGERY — IRRIGATION AND DEBRIDEMENT ABSCESS
Anesthesia: General | Site: Abdomen

## 2017-01-03 MED ORDER — 0.9 % SODIUM CHLORIDE (POUR BTL) OPTIME
TOPICAL | Status: DC | PRN
  Administered 2017-01-03: 1000 mL

## 2017-01-03 MED ORDER — ONDANSETRON HCL 4 MG/2ML IJ SOLN
INTRAMUSCULAR | Status: AC
  Filled 2017-01-03: qty 2

## 2017-01-03 MED ORDER — MORPHINE SULFATE (PF) 4 MG/ML IV SOLN
1.0000 mg | INTRAVENOUS | Status: DC | PRN
  Filled 2017-01-03: qty 1

## 2017-01-03 MED ORDER — KCL IN DEXTROSE-NACL 20-5-0.45 MEQ/L-%-% IV SOLN
INTRAVENOUS | Status: AC
  Administered 2017-01-03: 23:00:00 via INTRAVENOUS
  Filled 2017-01-03 (×2): qty 1000

## 2017-01-03 MED ORDER — POTASSIUM CHLORIDE 10 MEQ/100ML IV SOLN
10.0000 meq | INTRAVENOUS | Status: DC
  Administered 2017-01-03 (×3): 10 meq via INTRAVENOUS
  Filled 2017-01-03 (×3): qty 100

## 2017-01-03 MED ORDER — SODIUM CHLORIDE 0.9 % IJ SOLN
INTRAMUSCULAR | Status: AC
  Filled 2017-01-03: qty 10

## 2017-01-03 MED ORDER — PROPOFOL 10 MG/ML IV BOLUS
INTRAVENOUS | Status: AC
  Filled 2017-01-03: qty 20

## 2017-01-03 MED ORDER — SODIUM CHLORIDE 0.9 % IV SOLN
30.0000 meq | Freq: Once | INTRAVENOUS | Status: DC
  Filled 2017-01-03: qty 15

## 2017-01-03 MED ORDER — ONDANSETRON HCL 4 MG/2ML IJ SOLN: 4.0000 mg | Freq: Four times a day (QID) | INTRAMUSCULAR | Status: DC | PRN

## 2017-01-03 MED ORDER — FENTANYL CITRATE (PF) 100 MCG/2ML IJ SOLN
INTRAMUSCULAR | Status: AC
  Filled 2017-01-03: qty 2

## 2017-01-03 MED ORDER — ONDANSETRON HCL 4 MG/2ML IJ SOLN
INTRAMUSCULAR | Status: DC | PRN
  Administered 2017-01-03: 4 mg via INTRAVENOUS

## 2017-01-03 MED ORDER — PIPERACILLIN-TAZOBACTAM 3.375 G IVPB 30 MIN
3.3750 g | Freq: Once | INTRAVENOUS | Status: AC
  Administered 2017-01-03: 3.375 g via INTRAVENOUS

## 2017-01-03 MED ORDER — MIDAZOLAM HCL 2 MG/2ML IJ SOLN
INTRAMUSCULAR | Status: AC
  Filled 2017-01-03: qty 2

## 2017-01-03 MED ORDER — FENTANYL CITRATE (PF) 100 MCG/2ML IJ SOLN
INTRAMUSCULAR | Status: DC | PRN
  Administered 2017-01-03: 100 ug via INTRAVENOUS

## 2017-01-03 MED ORDER — ONDANSETRON 4 MG PO TBDP: 4.0000 mg | ORAL_TABLET | Freq: Four times a day (QID) | ORAL | Status: DC | PRN

## 2017-01-03 MED ORDER — MORPHINE SULFATE (PF) 4 MG/ML IV SOLN
1.0000 mg | INTRAVENOUS | Status: DC | PRN
  Administered 2017-01-03 – 2017-01-12 (×37): 1 mg via INTRAVENOUS
  Filled 2017-01-03 (×39): qty 1

## 2017-01-03 MED ORDER — KCL IN DEXTROSE-NACL 20-5-0.45 MEQ/L-%-% IV SOLN
INTRAVENOUS | Status: DC
  Administered 2017-01-03: 14:00:00 via INTRAVENOUS
  Filled 2017-01-03: qty 1000

## 2017-01-03 MED ORDER — PROPOFOL 10 MG/ML IV BOLUS
INTRAVENOUS | Status: DC | PRN
  Administered 2017-01-03: 150 mg via INTRAVENOUS

## 2017-01-03 MED ORDER — LIDOCAINE HCL (CARDIAC) 20 MG/ML IV SOLN
INTRAVENOUS | Status: DC | PRN
  Administered 2017-01-03: 50 mg via INTRAVENOUS

## 2017-01-03 MED ORDER — BUPIVACAINE HCL (PF) 0.25 % IJ SOLN
INTRAMUSCULAR | Status: AC
  Filled 2017-01-03: qty 30

## 2017-01-03 MED ORDER — MIDAZOLAM HCL 5 MG/5ML IJ SOLN
INTRAMUSCULAR | Status: DC | PRN
  Administered 2017-01-03: 2 mg via INTRAVENOUS

## 2017-01-03 MED ORDER — HYDROMORPHONE HCL 1 MG/ML IJ SOLN
0.2500 mg | INTRAMUSCULAR | Status: DC | PRN
  Administered 2017-01-03 (×2): 0.5 mg via INTRAVENOUS

## 2017-01-03 MED ORDER — PROMETHAZINE HCL 25 MG/ML IJ SOLN: 6.2500 mg | INTRAMUSCULAR | Status: DC | PRN

## 2017-01-03 MED ORDER — LACTATED RINGERS IV SOLN
INTRAVENOUS | Status: DC | PRN
  Administered 2017-01-03: 15:00:00 via INTRAVENOUS

## 2017-01-03 MED ORDER — SUCCINYLCHOLINE CHLORIDE 200 MG/10ML IV SOSY
PREFILLED_SYRINGE | INTRAVENOUS | Status: AC
  Filled 2017-01-03: qty 10

## 2017-01-03 MED ORDER — PANTOPRAZOLE SODIUM 40 MG IV SOLR
40.0000 mg | Freq: Every day | INTRAVENOUS | Status: DC
  Administered 2017-01-03 – 2017-01-11 (×9): 40 mg via INTRAVENOUS
  Filled 2017-01-03 (×9): qty 40

## 2017-01-03 MED ORDER — HEPARIN SODIUM (PORCINE) 5000 UNIT/ML IJ SOLN
5000.0000 [IU] | Freq: Three times a day (TID) | INTRAMUSCULAR | Status: DC
  Administered 2017-01-04 – 2017-01-12 (×25): 5000 [IU] via SUBCUTANEOUS
  Filled 2017-01-03 (×26): qty 1

## 2017-01-03 MED ORDER — PIPERACILLIN-TAZOBACTAM 3.375 G IVPB
3.3750 g | Freq: Three times a day (TID) | INTRAVENOUS | Status: DC
  Administered 2017-01-04 – 2017-01-12 (×25): 3.375 g via INTRAVENOUS
  Filled 2017-01-03 (×27): qty 50

## 2017-01-03 MED ORDER — LIDOCAINE 2% (20 MG/ML) 5 ML SYRINGE
INTRAMUSCULAR | Status: AC
  Filled 2017-01-03: qty 5

## 2017-01-03 MED ORDER — SUCCINYLCHOLINE CHLORIDE 20 MG/ML IJ SOLN
INTRAMUSCULAR | Status: DC | PRN
  Administered 2017-01-03: 120 mg via INTRAVENOUS

## 2017-01-03 MED ORDER — HYDROMORPHONE HCL 1 MG/ML IJ SOLN
INTRAMUSCULAR | Status: AC
  Filled 2017-01-03: qty 1

## 2017-01-03 MED ORDER — PIPERACILLIN-TAZOBACTAM 3.375 G IVPB
INTRAVENOUS | Status: AC
Start: 2017-01-03 — End: 2017-01-03
  Filled 2017-01-03: qty 50

## 2017-01-03 MED ORDER — ROCURONIUM BROMIDE 50 MG/5ML IV SOSY
PREFILLED_SYRINGE | INTRAVENOUS | Status: AC
  Filled 2017-01-03: qty 5

## 2017-01-03 SURGICAL SUPPLY — 35 items
APL SKNCLS STERI-STRIP NONHPOA (GAUZE/BANDAGES/DRESSINGS) ×1
BENZOIN TINCTURE PRP APPL 2/3 (GAUZE/BANDAGES/DRESSINGS) ×3 IMPLANT
BLADE HEX COATED 2.75 (ELECTRODE) ×3 IMPLANT
BLADE SURG 15 STRL LF DISP TIS (BLADE) ×1 IMPLANT
BLADE SURG 15 STRL SS (BLADE) ×3
BLADE SURG SZ10 CARB STEEL (BLADE) ×3 IMPLANT
CLOSURE WOUND 1/2 X4 (GAUZE/BANDAGES/DRESSINGS) ×1
COVER SURGICAL LIGHT HANDLE (MISCELLANEOUS) ×3 IMPLANT
DECANTER SPIKE VIAL GLASS SM (MISCELLANEOUS) ×3 IMPLANT
DRAPE LAPAROTOMY TRNSV 102X78 (DRAPE) ×3 IMPLANT
ELECT PENCIL ROCKER SW 15FT (MISCELLANEOUS) ×3 IMPLANT
ELECT REM PT RETURN 9FT ADLT (ELECTROSURGICAL) ×3
ELECTRODE REM PT RTRN 9FT ADLT (ELECTROSURGICAL) ×1 IMPLANT
GAUZE IODOFORM PACK 1/2 7832 (GAUZE/BANDAGES/DRESSINGS) ×2 IMPLANT
GAUZE SPONGE 4X4 12PLY STRL (GAUZE/BANDAGES/DRESSINGS) ×3 IMPLANT
GAUZE SPONGE 4X4 16PLY XRAY LF (GAUZE/BANDAGES/DRESSINGS) ×3 IMPLANT
GLOVE BIOGEL M 8.0 STRL (GLOVE) ×3 IMPLANT
GLOVE BIOGEL PI IND STRL 7.0 (GLOVE) ×1 IMPLANT
GLOVE BIOGEL PI IND STRL 7.5 (GLOVE) IMPLANT
GLOVE BIOGEL PI INDICATOR 7.0 (GLOVE) ×2
GLOVE BIOGEL PI INDICATOR 7.5 (GLOVE) ×2
GLOVE SURG SS PI 7.5 STRL IVOR (GLOVE) ×2 IMPLANT
GOWN SPEC L4 XLG W/TWL (GOWN DISPOSABLE) ×3 IMPLANT
GOWN STRL REUS W/TWL XL LVL3 (GOWN DISPOSABLE) ×9 IMPLANT
KIT BASIN OR (CUSTOM PROCEDURE TRAY) ×3 IMPLANT
MARKER SKIN DUAL TIP RULER LAB (MISCELLANEOUS) ×3 IMPLANT
NEEDLE HYPO 22GX1.5 SAFETY (NEEDLE) ×3 IMPLANT
PACK BASIC VI WITH GOWN DISP (CUSTOM PROCEDURE TRAY) ×3 IMPLANT
SOL PREP POV-IOD 4OZ 10% (MISCELLANEOUS) ×3 IMPLANT
STRIP CLOSURE SKIN 1/2X4 (GAUZE/BANDAGES/DRESSINGS) ×2 IMPLANT
SUT VIC AB 4-0 SH 18 (SUTURE) ×3 IMPLANT
SYR CONTROL 10ML LL (SYRINGE) ×3 IMPLANT
TAPE CLOTH SURG 6X10 WHT LF (GAUZE/BANDAGES/DRESSINGS) ×2 IMPLANT
TUBE ANAEROBIC SPECIMEN COL (MISCELLANEOUS) ×2 IMPLANT
YANKAUER SUCT BULB TIP 10FT TU (MISCELLANEOUS) ×2 IMPLANT

## 2017-01-03 NOTE — H&P (Signed)
Chief Complaint:  Drainage from incision with CT suggesting Crohn's fistual  History of Present Illness:  [name] is an 61 y.o. male with longstanding history of Crohn's.  About 1.5 years ago he underwent closure of an ileostomy and placement of alloderm.  He did well until a few days ago when he had drainage from his wound.  The CT listed below was seen.    Past Medical History:  Diagnosis Date  . Bladder stone   . 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 Crohn's disease ([place])   . History of aseptic necrosis of bone BILATERAL HIPS   S/P BONE GRAFT  . History of kidney stones   . History of mycosis fungoides   . S/P ileostomy ([place])     Past Surgical History:  Procedure Laterality Date  . BONE GRAFT OF LEFT HIP  1986   ASEPTIC NECROSIS  . COLONOSCOPY    . CYSTO/ BILATERAL RETROGRADE PYELOGRAM/ LEFT URETERAL STONE EXTRACTION / BILATERAL STENT PLACEMENT  10-07-2003  . CYSTOSCOPY WITH LITHOLAPAXY  02/12/2012   Procedure: CYSTOSCOPY WITH LITHOLAPAXY;  Surgeon: Claybon Jabs, MD;  Location: [facility];  Service: Urology;  Laterality: N/A;  . EXPL. LAP. W/ ENTEROLYSIS, RESECTION INFLAMMATORY MASS RLQ / TAKE-DOWN OF FISTULA WITH ENTEROENTEROSTOMY/ RESECTION WITH THE SMALL BOWEL TO ASCENDING COLON ANASTOMOSIS  10-29-2000   CROHN'S DISEASE W/ ANASTOMOTIC INFLAMMATORY MASS/    10-30-2000 EXPL. LAP. CONTROL POST-OP ABD. BLEEDING  . EXPLORATORY LAPAROTOMY/ RESECTION OF ILEOCOLONIC ANASTOMOSIS AND CREATION OF ILEOSTOMY  11-04-2000   ABD. PERFORATION  . ILEOSTOMY CLOSURE N/A 07/23/2015   Procedure: Takedown ileostomy and repair of ostomy hernia, extensive entrolysis (2.5 hrs), ileostransverse colon anastomosis; closure of massive ventral hernia with 20 x 30 Strattice mesh;  Surgeon: Johnathan Hausen, MD;  Location: [facility];  Service: General;  Laterality: N/A;  . IRRIGATION AND DEBRIDEMENT ABSCESS Right 09/09/2014   Procedure: MINOR INCISION AND DRAINAGE OF ABSCESS;  Surgeon: Charlotte Crumb, MD;  Location: [place];  Service: Orthopedics;  Laterality: Right;  incision and drainage right long finger   . LAPAROSCOPY N/A 07/23/2015   Procedure: LAPAROSCOPY DIAGNOSTIC;  Surgeon: Johnathan Hausen, MD;  Location: [facility];  Service: General;  Laterality: N/A;  . ORIF ANKLE FRACTURE Left 08/27/2014   Procedure:  open reduction internal fixation left ankle;  Surgeon: Nita Sells, MD;  Location: [place];  Service: Orthopedics;  Laterality: Left;  Left open reduction internal fixation ankle  . RESECTION OF TERMINAL ILEUM  1983  . RIGHT URETEROSCOPIC STONE EXTRACITON  04-24-2005  & 12-01-2002    Current Facility-Administered Medications  Medication Dose Route Frequency Provider Last Rate Last Dose  . dextrose 5 % and 0.45 % NaCl with KCl 20 mEq/L infusion   Intravenous Continuous Alphonsa Overall, MD 100 mL/hr at 01/03/17 1426    . morphine 4 MG/ML injection 1-3 mg  1-3 mg Intravenous Q2H PRN Alphonsa Overall, MD      . potassium chloride 30 mEq in sodium chloride 0.9 % 265 mL (KCL MULTIRUN) IVPB  30 mEq Intravenous Once Alphonsa Overall, MD       Patient has no known allergies. Family History  Problem Relation Age of Onset  . Alzheimer's disease Father   . Cancer Father   . Cancer Sister    Social History:   reports that he quit smoking about 22 years ago. He has never used smokeless tobacco. He reports that he does not drink alcohol or  use drugs.   REVIEW OF SYSTEMS : Negative except for extensive history of Crohn's-no biologics or steroids recently  Physical Exam:   Blood pressure 104/70, pulse 85, temperature 99.5 F (37.5 C), temperature source Oral, SpO2 100 %. There is no height or weight on file to calculate BMI.  Gen:  WDWN AAM NAD  Neurological: Alert and oriented to person, place, and time. Motor and sensory function is grossly intact  Head: Normocephalic and atraumatic.  Eyes: Conjunctivae are normal. Pupils are equal, round, and reactive to light.  No scleral icterus.  Neck: Normal range of motion. Neck supple. No tracheal deviation or thyromegaly present.  Cardiovascular:  SR without murmurs or gallops.  No carotid bruits Breast:  Not examined Respiratory: Effort normal.  No respiratory distress. No chest wall tenderness. Breath sounds normal.  No wheezes, rales or rhonchi.  Abdomen:  proturberant and glistening skin in the right upper quadrant with imminent abscess drainage GU:  Not examined Musculoskeletal: Normal range of motion. Extremities are nontender. No cyanosis, edema or clubbing noted Lymphadenopathy: No cervical, preauricular, postauricular or axillary adenopathy is present Skin: Skin is warm and dry. No rash noted. No diaphoresis. No erythema. No pallor. Pscyh: Normal mood and affect. Behavior is normal. Judgment and thought content normal.   LABORATORY RESULTS: Results for orders placed or performed during the hospital encounter of 01/03/17 (from the past 48 hour(s))  Comprehensive metabolic panel     Status: Abnormal   Collection Time: 01/03/17  1:37 PM  Result Value Ref Range   Sodium 139 135 - 145 mmol/L   Potassium 2.2 (LL) 3.5 - 5.1 mmol/L    Comment: REPEATED TO VERIFY CRITICAL RESULT CALLED TO, READ BACK BY AND VERIFIED WITH: [facility] RN [phone] A.[email or web address]    Chloride 107 101 - 111 mmol/L   CO2 25 22 - 32 mmol/L   Glucose, Bld 90 65 - 99 mg/dL   BUN 13 6 - 20 mg/dL   Creatinine, Ser 1.05 0.61 - 1.24 mg/dL   Calcium 8.1 (L) 8.9 - 10.3 mg/dL   Total Protein 7.0 6.5 - 8.1 g/dL   Albumin 2.6 (L) 3.5 - 5.0 g/dL   AST 11 (L) 15 - 41 U/L   ALT 10 (L) 17 - 63 U/L   Alkaline Phosphatase 68 38 - 126 U/L   Total Bilirubin 0.9 0.3 - 1.2 mg/dL   GFR calc non Af Amer >60 >60 mL/min   GFR calc Af Amer >60 >60 mL/min    Comment: (NOTE) The eGFR has been calculated using the CKD EPI equation. This calculation has not been validated in all clinical situations. eGFR's persistently <60 mL/min signify possible Chronic  Kidney Disease.    Anion gap 7 5 - 15  CBC with Differential/Platelet     Status: Abnormal   Collection Time: 01/03/17  1:37 PM  Result Value Ref Range   WBC 10.1 4.0 - 10.5 K/uL   RBC 3.89 (L) 4.22 - 5.81 MIL/uL   Hemoglobin 11.2 (L) 13.0 - 17.0 g/dL   HCT 33.6 (L) 39.0 - 52.0 %   MCV 86.4 78.0 - 100.0 fL   MCH 28.8 26.0 - 34.0 pg   MCHC 33.3 30.0 - 36.0 g/dL   RDW 15.8 (H) 11.5 - 15.5 %   Platelets 338 150 - 400 K/uL   Neutrophils Relative % 78 %   Neutro Abs 7.9 (H) 1.7 - 7.7 K/uL   Lymphocytes Relative 12 %   Lymphs Abs 1.2 0.7 - 4.0  K/uL   Monocytes Relative 10 %   Monocytes Absolute 1.0 0.1 - 1.0 K/uL   Eosinophils Relative 0 %   Eosinophils Absolute 0.0 0.0 - 0.7 K/uL   Basophils Relative 0 %   Basophils Absolute 0.0 0.0 - 0.1 K/uL     RADIOLOGY RESULTS: Ct Abdomen Pelvis W Contrast  Result Date: 01/02/2017 CLINICAL DATA:  61 year old male with a reported history of acute mid abdominal pain. History of Crohn disease with complex bowel surgical history, with remote ileocecal resection, subsequent resection of ileocolonic anastomosis with ileostomy creation in 2001, and subsequent ileostomy takedown, repair of parastomal hernia and ventral hernia and ileotransverse colonic anastomosis on 07/23/2015. EXAM: CT ABDOMEN AND PELVIS WITH CONTRAST TECHNIQUE: Multidetector CT imaging of the abdomen and pelvis was performed using the standard protocol following bolus administration of intravenous contrast. Creatinine was obtained on site at [facility] at [address] Results: Creatinine 1.0 mg/dL. CONTRAST:  163m ISOVUE-300 IOPAMIDOL (ISOVUE-300) INJECTION 61% COMPARISON:  12/21/2011 CT abdomen/ pelvis. FINDINGS: Lower chest: No significant pulmonary nodules or acute consolidative airspace disease. Hepatobiliary: Normal liver size. Several simple liver cysts, largest 6.3 cm in the left liver lobe. Several subcentimeter hypodense liver lesions scattered throughout the  liver, too small to characterize, not convincingly changed since 12/21/2011, suggesting benign liver lesions. Cholelithiasis, with no gallbladder distention, gallbladder wall thickening or pericholecystic fluid . No biliary ductal dilatation. Pancreas: Normal, with no mass or duct dilation. Spleen: Normal size. No mass. Adrenals/Urinary Tract: No discrete adrenal nodules. Simple 1.9 cm upper left renal cyst. Several subcentimeter hypodense renal cortical lesions scattered throughout both kidneys are too small to characterize and require no further follow-up. Small left extrarenal pelvis. No hydronephrosis. Nonobstructing 10 mm upper, 7 mm lower and 6 mm lower left renal stones. Nonobstructing 2 mm lower right renal stone. Normal bladder. Stomach/Bowel: Grossly normal stomach. Status post ileocecal resection with intact appearing ileocolic anastomosis in the right lower abdomen. Expected small bowel dilatation at the site of an enteroenterostomy in the ventral mid abdomen. Otherwise normal caliber small bowel loops with no focal small bowel caliber transition. Mild wall thickening in the neo terminal ileum. There is a complex colocutaneous gas containing fistula with thick enhancing wall in the ventral right abdominal wall arising from the proximal transverse colon with a branching fistula tract extending to the skin surface in two locations, one located in the lateral ventral right abdominal wall and one located more superiorly in the midline upper ventral abdominal wall at the site of an open wound with overlying bandaging. There is a subcutaneous 4.6 x 4.3 x 4.3 cm gas containing abscess in the lateral ventral right abdominal wall (series 2/image 43) associated with the lateral aspect of the colocutaneous fistula tract. There is a thick walled interloop fistula associated with the colocutaneous fistula at the anterior margin of the proximal transverse colon (series 2/image 42). No additional sites of large bowel  wall thickening. Oral contrast progresses to the distal rectum. Large amount of stool in the distal colon and rectum. Vascular/Lymphatic: Atherosclerotic nonaneurysmal abdominal aorta. Patent portal, splenic, hepatic and renal veins. No pathologically enlarged lymph nodes in the abdomen or pelvis. Reproductive: Mildly enlarged prostate. Other: No ascites. No additional intraperitoneal fluid collections. Diastasis of the entire midline ventral abdominal wall. Musculoskeletal: No aggressive appearing focal osseous lesions. 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 Mild thoracolumbar spondylosis. Surgical pin in the left proximal femoral metaphysis. IMPRESSION: 1.  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 Subcutaneous gas containing abscess in the lateral right ventral abdominal wall associated with the lateral portion of the colocutaneous fistula tract. 2. No evidence of bowel obstruction. 3. Additional findings include aortic atherosclerosis, cholelithiasis, nonobstructing bilateral nephrolithiasis and mildly enlarged prostate. Electronically Signed   By: Ilona Sorrel M.D.   On: 01/02/2017 11:30    Problem List: Patient Active Problem List   Diagnosis Date Noted  . Crohn's disease ([place]) 07/23/2015    Assessment & Plan: Crohn's with abscess; fistula--or operative drainage or possible diversion.      [name] Done, MD, [facility] [phone] beeper [phone]  01/03/2017 2:36 PM

## 2017-01-03 NOTE — Transfer of Care (Signed)
Immediate Anesthesia Transfer of Care Note  Patient: [name]  Procedure(s) Performed: Procedure(s): IRRIGATION AND DEBRIDEMENT ABDOMINAL WALL ABSCESS (N/A)  Patient Location: PACU  Anesthesia Type:General  Level of Consciousness: awake, alert  and oriented  Airway & Oxygen Therapy: Patient Spontanous Breathing and Patient connected to face mask oxygen  Post-op Assessment: Report given to RN and Post -op Vital signs reviewed and stable  Post vital signs: Reviewed and stable  Last Vitals:  Vitals:   01/03/17 1217  BP: 104/70  Pulse: 85  Resp: 16  Temp: 37.5 C    Last Pain:  Vitals:   01/03/17 1217  TempSrc: Oral         Complications: No apparent anesthesia complications

## 2017-01-03 NOTE — Anesthesia Preprocedure Evaluation (Addendum)
Anesthesia Evaluation  Patient identified by MRN, date of birth, ID band Patient awake    Reviewed: Allergy & Precautions, NPO status , Patient's Chart, lab work & pertinent test results  Airway Mallampati: II   Neck ROM: full    Dental   Pulmonary former smoker,    breath sounds clear to auscultation       Cardiovascular negative cardio ROS   Rhythm:regular Rate:Normal     Neuro/Psych negative neurological ROS     GI/Hepatic Neg liver ROS, Crohn's disease.  .   Endo/Other  negative endocrine ROS  Renal/GU negative Renal ROS     Musculoskeletal negative musculoskeletal ROS (+)   Abdominal   Peds  Hematology  (+) anemia ,   Anesthesia Other Findings   Reproductive/Obstetrics                             Lab Results  Component Value Date   WBC 10.1 01/03/2017   HGB 11.2 (L) 01/03/2017   HCT 33.6 (L) 01/03/2017   MCV 86.4 01/03/2017   PLT 338 01/03/2017    Anesthesia Physical  Anesthesia Plan  ASA: II and emergent  Anesthesia Plan: General   Post-op Pain Management:    Induction: Intravenous  Airway Management Planned: Oral ETT  Additional Equipment:   Intra-op Plan:   Post-operative Plan: Extubation in OR  Informed Consent: I have reviewed the patients History and Physical, chart, labs and discussed the procedure including the risks, benefits and alternatives for the proposed anesthesia with the patient or authorized representative who has indicated his/her understanding and acceptance.     Plan Discussed with: CRNA, Anesthesiologist and Surgeon  Anesthesia Plan Comments:        Anesthesia Quick Evaluation

## 2017-01-03 NOTE — Anesthesia Procedure Notes (Signed)
Procedure Name: Intubation Date/Time: 01/03/2017 3:13 PM Performed by: Glory Buff Pre-anesthesia Checklist: Patient identified, Emergency Drugs available, Suction available and Patient being monitored Patient Re-evaluated:Patient Re-evaluated prior to inductionOxygen Delivery Method: Circle system utilized Preoxygenation: Pre-oxygenation with 100% oxygen Intubation Type: IV induction Ventilation: Mask ventilation without difficulty Laryngoscope Size: Miller and 3 Grade View: Grade I Tube type: Oral Tube size: 7.5 mm Number of attempts: 1 Airway Equipment and Method: Stylet and Oral airway Placement Confirmation: ETT inserted through vocal cords under direct vision,  positive ETCO2 and breath sounds checked- equal and bilateral Secured at: 21 cm Tube secured with: Tape Dental Injury: Teeth and Oropharynx as per pre-operative assessment

## 2017-01-03 NOTE — Op Note (Signed)
Surgeon: Kaylyn Lim, MD, FACS  Asst:  none  Anes:  general  Preop Dx: Abscess of anterior abdominal wall Postop Dx: same  Procedure: I & D of abscess Location Surgery: [facility] Complications: none  EBL:   minimal cc  Drains: none  Description of Procedure:  The patient was taken to OR 2 .  After anesthesia was administered and the patient was prepped a timeout was performed.  The abdomen was prepped with [employer].  The upper midline had a quarter sized opening to fascia with some yellow purulence.  The old ileostomy site was fluctuant and this was incised and yellow pus cultured.  The pus was evacuated.  Manipulation of the abdomen failed to produce any more drainage.  There was no drainage that appeared enteric.  After irrigation, I packed the two holes with half inch iodophor gauze.    The patient tolerated the procedure well and was taken to the PACU in stable condition.     [name] Done, [place], [facility], [place]

## 2017-01-03 NOTE — Anesthesia Postprocedure Evaluation (Signed)
Anesthesia Post Note  Patient: [name]  Procedure(s) Performed: Procedure(s) (LRB): IRRIGATION AND DEBRIDEMENT ABDOMINAL WALL ABSCESS (N/A)  Patient location during evaluation: PACU Anesthesia Type: General Level of consciousness: awake and alert Pain management: pain level controlled Vital Signs Assessment: post-procedure vital signs reviewed and stable Respiratory status: spontaneous breathing, nonlabored ventilation, respiratory function stable and patient connected to nasal cannula oxygen Cardiovascular status: blood pressure returned to baseline and stable Postop Assessment: no signs of nausea or vomiting Anesthetic complications: no       Last Vitals:  Vitals:   01/03/17 1630 01/03/17 1645  BP: 104/63 106/68  Pulse: 86 87  Resp: 10 12  Temp: 36.9 C 36.9 C    Last Pain:  Vitals:   01/03/17 1630  TempSrc:   PainSc: 4                  Tiajuana Amass

## 2017-01-04 ENCOUNTER — Encounter (HOSPITAL_COMMUNITY): Payer: Self-pay | Admitting: Surgery

## 2017-01-04 DIAGNOSIS — E876 Hypokalemia: Secondary | ICD-10-CM

## 2017-01-04 DIAGNOSIS — E440 Moderate protein-calorie malnutrition: Secondary | ICD-10-CM | POA: Insufficient documentation

## 2017-01-04 LAB — CBC
HCT: 28.9 % — ABNORMAL LOW (ref 39.0–52.0)
Hemoglobin: 9.5 g/dL — ABNORMAL LOW (ref 13.0–17.0)
MCH: 28.8 pg (ref 26.0–34.0)
MCHC: 32.9 g/dL (ref 30.0–36.0)
MCV: 87.6 fL (ref 78.0–100.0)
PLATELETS: 255 10*3/uL (ref 150–400)
RBC: 3.3 MIL/uL — ABNORMAL LOW (ref 4.22–5.81)
RDW: 15.9 % — ABNORMAL HIGH (ref 11.5–15.5)
WBC: 7.8 10*3/uL (ref 4.0–10.5)

## 2017-01-04 LAB — COMPREHENSIVE METABOLIC PANEL
ALBUMIN: 2.1 g/dL — AB (ref 3.5–5.0)
ALK PHOS: 53 U/L (ref 38–126)
ALT: 9 U/L — ABNORMAL LOW (ref 17–63)
AST: 12 U/L — ABNORMAL LOW (ref 15–41)
Anion gap: 5 (ref 5–15)
BILIRUBIN TOTAL: 0.4 mg/dL (ref 0.3–1.2)
BUN: 10 mg/dL (ref 6–20)
CALCIUM: 7.5 mg/dL — AB (ref 8.9–10.3)
CO2: 25 mmol/L (ref 22–32)
CREATININE: 0.97 mg/dL (ref 0.61–1.24)
Chloride: 109 mmol/L (ref 101–111)
GFR calc Af Amer: >60 mL/min (ref >60)
GFR calc non Af Amer: >60 mL/min (ref >60)
GLUCOSE: 167 mg/dL — AB (ref 65–99)
Potassium: 2.7 mmol/L — CL (ref 3.5–5.1)
SODIUM: 139 mmol/L (ref 135–145)
Total Protein: 5.5 g/dL — ABNORMAL LOW (ref 6.5–8.1)

## 2017-01-04 LAB — MAGNESIUM: Magnesium: 1.4 mg/dL — ABNORMAL LOW (ref 1.7–2.4)

## 2017-01-04 LAB — PHOSPHORUS: Phosphorus: 2.6 mg/dL (ref 2.5–4.6)

## 2017-01-04 MED ORDER — FAT EMULSION 20 % IV EMUL
250.0000 mL | INTRAVENOUS | Status: AC
  Administered 2017-01-04: 250 mL via INTRAVENOUS
  Filled 2017-01-04: qty 250

## 2017-01-04 MED ORDER — BISACODYL 10 MG RE SUPP: 10.0000 mg | Freq: Every day | RECTAL | Status: DC | PRN

## 2017-01-04 MED ORDER — POTASSIUM CHLORIDE 10 MEQ/100ML IV SOLN
10.0000 meq | INTRAVENOUS | Status: AC
  Administered 2017-01-04 (×3): 10 meq via INTRAVENOUS
  Filled 2017-01-04 (×3): qty 100

## 2017-01-04 MED ORDER — INSULIN ASPART 100 UNIT/ML ~~LOC~~ SOLN
0.0000 [IU] | Freq: Four times a day (QID) | SUBCUTANEOUS | Status: DC
  Administered 2017-01-05: 2 [IU] via SUBCUTANEOUS
  Administered 2017-01-05 (×2): 3 [IU] via SUBCUTANEOUS

## 2017-01-04 MED ORDER — SODIUM CHLORIDE 0.9% FLUSH
10.0000 mL | INTRAVENOUS | Status: DC | PRN
  Administered 2017-01-05 – 2017-01-11 (×4): 10 mL
  Filled 2017-01-04 (×4): qty 40

## 2017-01-04 MED ORDER — MAGNESIUM SULFATE 4 GM/100ML IV SOLN
4.0000 g | Freq: Once | INTRAVENOUS | Status: AC
  Administered 2017-01-04: 4 g via INTRAVENOUS
  Filled 2017-01-04: qty 100

## 2017-01-04 MED ORDER — KCL IN DEXTROSE-NACL 20-5-0.45 MEQ/L-%-% IV SOLN
INTRAVENOUS | Status: AC
  Administered 2017-01-04 – 2017-01-05 (×3): via INTRAVENOUS
  Filled 2017-01-04 (×3): qty 1000

## 2017-01-04 MED ORDER — LIP MEDEX EX OINT
TOPICAL_OINTMENT | CUTANEOUS | Status: AC
  Administered 2017-01-04: 14:00:00
  Filled 2017-01-04: qty 7

## 2017-01-04 MED ORDER — M.V.I. ADULT IV INJ
INJECTION | INTRAVENOUS | Status: AC
  Administered 2017-01-04: 18:00:00 via INTRAVENOUS
  Filled 2017-01-04: qty 960

## 2017-01-04 MED ORDER — POTASSIUM CHLORIDE 10 MEQ/100ML IV SOLN
10.0000 meq | INTRAVENOUS | Status: AC
  Administered 2017-01-04 (×3): 10 meq via INTRAVENOUS
  Filled 2017-01-04 (×6): qty 100

## 2017-01-04 NOTE — Progress Notes (Signed)
1 Day Post-Op  Subjective: Stable, both open sites are purulent.  I have changed the dressings and will do TID dressing changes.  He seems to be fairly tolerant of what is going on so far.  No complaints.    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 Subcutaneous gas containing abscess in the lateral right ventral abdominal wall associated with the lateral portion of the colocutaneous fistula tract. NPO 2500 IV Urine 675 Afebrile, VSS K+ 2.7/mag 1.4 CBC OK Intake/Output from previous day: 02/21 0701 - 02/22 0700 In: 2554.2 [I.V.:2454.2; IV Piggyback:100] Out: 675 [Urine:675] Intake/Output this shift: Total I/O In: 11 [IV Piggyback:11] Out: -   General appearance: alert, cooperative and no distress GI: 2 open areas one on each side of the abdomen.  Both are very purulent.  Nothing that looks brown so far.    Lab Results:   Recent Labs  01/03/17 1745 01/04/17 0446  WBC 9.7 7.8  HGB 9.9* 9.5*  HCT 29.8* 28.9*  PLT 297 255    BMET  Recent Labs  01/03/17 1337 01/03/17 1745 01/04/17 0446  NA 139  --  139  K 2.2*  --  2.7*  CL 107  --  109  CO2 25  --  25  GLUCOSE 90  --  167*  BUN 13  --  10  CREATININE 1.05 0.86 0.97  CALCIUM 8.1*  --  7.5*   PT/INR No results for input(s): LABPROT, INR in the last 72 hours.   Recent Labs Lab 01/03/17 1337 01/04/17 0446  AST 11* 12*  ALT 10* 9*  ALKPHOS 68 53  BILITOT 0.9 0.4  PROT 7.0 5.5*  ALBUMIN 2.6* 2.1*     Lipase     Component Value Date/Time   LIPASE 24  02/02/2009 [id number]     Studies/Results: Ct Abdomen Pelvis W Contrast  Result Date: 01/02/2017 CLINICAL DATA:  61 year old male with a reported history of acute mid abdominal pain. History of Crohn disease with complex bowel surgical history, with remote ileocecal resection, subsequent resection of ileocolonic anastomosis with ileostomy creation in 2001, and subsequent ileostomy takedown, repair of parastomal hernia and ventral hernia and ileotransverse colonic anastomosis on 07/23/2015. EXAM: CT ABDOMEN AND PELVIS WITH CONTRAST TECHNIQUE: Multidetector CT imaging of the abdomen and pelvis was performed using the standard protocol following bolus administration of intravenous contrast. Creatinine was obtained on site at [place] at [address] Results: Creatinine 1.0 mg/dL. CONTRAST:  157mL ISOVUE-300 IOPAMIDOL (ISOVUE-300) INJECTION 61% COMPARISON:  12/21/2011 CT abdomen/ pelvis. FINDINGS: Lower chest: No significant pulmonary nodules or acute consolidative airspace disease. Hepatobiliary: Normal liver size. Several simple liver cysts, largest 6.3 cm in the left liver lobe. Several subcentimeter hypodense liver lesions scattered throughout the liver, too small to characterize, not convincingly changed since 12/21/2011, suggesting benign liver lesions. Cholelithiasis, with no gallbladder distention, gallbladder wall thickening or pericholecystic fluid . No biliary ductal dilatation. Pancreas: Normal, with no mass or duct dilation. Spleen: Normal size. No mass. Adrenals/Urinary Tract: No discrete adrenal nodules. Simple 1.9 cm upper left renal  cyst. Several subcentimeter hypodense renal cortical lesions scattered throughout both kidneys are too small to characterize and require no further follow-up. Small left extrarenal pelvis. No hydronephrosis. Nonobstructing 10 mm upper, 7 mm lower and 6 mm lower left renal stones. Nonobstructing 2 mm lower right renal stone. Normal bladder. Stomach/Bowel:  Grossly normal stomach. Status post ileocecal resection with intact appearing ileocolic anastomosis in the right lower abdomen. Expected small bowel dilatation at the site of an enteroenterostomy in the ventral mid abdomen. Otherwise normal caliber small bowel loops with no focal small bowel caliber transition. Mild wall thickening in the neo terminal ileum. There is a complex colocutaneous gas containing fistula with thick enhancing wall in the ventral right abdominal wall arising from the proximal transverse colon with a branching fistula tract extending to the skin surface in two locations, one located in the lateral ventral right abdominal wall and one located more superiorly in the midline upper ventral abdominal wall at the site of an open wound with overlying bandaging. There is a subcutaneous 4.6 x 4.3 x 4.3 cm gas containing abscess in the lateral ventral right abdominal wall (series 2/image 43) associated with the lateral aspect of the colocutaneous fistula tract. There is a thick walled interloop fistula associated with the colocutaneous fistula at the anterior margin of the proximal transverse colon (series 2/image 42). No additional sites of large bowel wall thickening. Oral contrast progresses to the distal rectum. Large amount of stool in the distal colon and rectum. Vascular/Lymphatic: Atherosclerotic nonaneurysmal abdominal aorta. Patent portal, splenic, hepatic and renal veins. No pathologically enlarged lymph nodes in the abdomen or pelvis. Reproductive: Mildly enlarged prostate. Other: No ascites. No additional intraperitoneal fluid collections. Diastasis of the entire midline ventral abdominal wall. Musculoskeletal: No aggressive appearing focal osseous lesions. 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 Mild thoracolumbar spondylosis. Surgical pin in the left proximal femoral metaphysis. IMPRESSION: 1. 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 Subcutaneous gas containing abscess in the lateral right ventral abdominal wall associated with the lateral portion of the colocutaneous fistula tract. 2. No evidence of bowel obstruction. 3. Additional findings include aortic atherosclerosis, cholelithiasis, nonobstructing bilateral nephrolithiasis and mildly enlarged prostate. Electronically Signed   By: Ilona Sorrel M.D.   On: 01/02/2017 11:30   Prior to Admission medications   Medication Sig Start Date End Date Taking? Authorizing Provider  potassium chloride (K-DUR,KLOR-CON) 10 MEQ tablet Take 40 mEq by mouth 4 (four) times daily.   Yes Historical Provider, MD    Medications: . heparin  5,000 Units Subcutaneous Q8H  . pantoprazole (PROTONIX) IV  40 mg Intravenous QHS  . piperacillin-tazobactam (ZOSYN)  IV  3.375 g Intravenous Q8H  . potassium chloride  10 mEq Intravenous Q1 Hr x 6   . dextrose 5 % and 0.45 % NaCl with KCl 20 mEq/L 125 mL/hr at 01/03/17 2246    Assessment/Plan 1.  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 Johnathan Hausen POD  #1  2.  HX ofCrohn's disease with terminal ileal resection done at [facility] in [place] disease with anastomotic inflammatory mass consistent with Crohns and enterofistula.  3.  S/p  Exploratory laparotomy with enterolysis, resection of inflammatory mass in right lower quadrant, and take-down of fistula with enteroenterostomy and resection with  the small bowel to ascending colon anastomosis, 10/29/09, DR. Martin/ Post op bleed with reexploration that PM 4.    Attempted laparoscopy, laparotomy with extensive enterolysis (2.5 hours) and takedown of paraileostomy hernia, creation of distal small bowel to  transverse colon anastomosis, repair of ventral hernia with 20 x 30 cm Strattice mesh, 07/23/15, DR. Martin  5.  Hx of recurrent nephrolithiasis, urolithiasis, acute pyelonephritis 6.  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 7.  Hypokalemia/Hypomagnesemia - being replaced FEN:  IV fluids/NPO -   TNA ordered ID:  Zosyn 01/03/17 =>> day 2 DVT:  Heparin    Plan:  IV antibiotics, replace K+, dressing changes,   Keep him NPO and start TNA.   Switch to Lovenox tonight.  LOS: 1 day    JENNINGS,WILLARD 01/04/2017 [phone]  Agree with above. He has been followed by Dr. Oletta Lamas for his Crohn's, but has not seen him since Dr. Carlye Grippe last operation in Sept 2016.  Doing okay.  Has not been out of bed because of busy day.  But she knows to get out of bed and move.  Alphonsa Overall, MD, [facility] Surgery Pager: [phone] Office phone:  [phone]

## 2017-01-04 NOTE — Progress Notes (Signed)
PHARMACY - ADULT TOTAL PARENTERAL NUTRITION CONSULT NOTE   Pharmacy Consult for TPN Indication: EC fistula  Patient Measurements:  wt: 70.4 kg   Insulin Requirements: no hx of diabetes  Current Nutrition: NPO  IVF: D5 1/2 NS w/ 51meq KCl/L @ 135mls/hr  Central access: PICC placed 2/22 TPN start date: 2/22  ASSESSMENT                                                                                                          HPI:  61 y.o. male with longstanding history of Crohn's.  About 1.5 years ago he underwent closure of an ileostomy and placement of alloderm.  He did well until a few days ago when he had drainage from his wound.   S/p I&D abdominal wall abscess 01/03/17  Significant events:   Today:    Glucose - 167  Electrolytes - K and Mag low>replaced by CCS,  CCa WNL, all others WNL  Renal - WNL  LFTs - low  TGs - am labs tomorrow  Prealbumin - 7.6  NUTRITIONAL GOALS                                                                                             RD recs: waiting for recs Clinimix E 5/15 at a goal rate of 63ml/hr + 20% fat emulsion at 52ml/hr over 12 hours to provide: 99.6 g/day protein, 1912 Kcal/day. +++ clinimix is on Psychologist, prison and probation services, pharmacy will use product based on availability++ PLAN                                                                                                                         At 1800 today:  Start Clinimix E 5/15 at 51ml/hr.  20% fat emulsion at 55ml/hr over 12 hours  Plan to advance as tolerated to the goal rate.  TPN to contain standard multivitamins and trace elements.  Reduce IVF to 53ml/hr.  Add moderate SSI .   TPN lab panels on Mondays & Thursdays.  F/u daily.  Dolly Rias RPh 01/04/2017, 11:25 AM Pager [phone]

## 2017-01-04 NOTE — Progress Notes (Signed)
Initial Nutrition Assessment  DOCUMENTATION CODES:   Non-severe (moderate) malnutrition in context of acute illness/injury  INTERVENTION:   TPN per Pharmacy  Recommend repletion of potassium and magnesium prior to initiation of TPN.  NUTRITION DIAGNOSIS:   Malnutrition related to acute illness, crohn's disease, abscess as evidenced by severe depletion of body fat, severe depletion of muscle mass, 6 percent weight loss in 1 month.  GOAL:   Patient will meet greater than or equal to 90% of their needs  MONITOR:   Diet advancement, Labs, Skin, Weight trends, I & O's  REASON FOR ASSESSMENT:   Consult New TPN/TNA  ASSESSMENT:   61 y.o. male with longstanding history of Crohn's.  About 1.5 years ago he underwent closure of an ileostomy and placement of alloderm.  He did well until a few days ago when he had drainage from his wound. Now s/p I & D of anterior wall abscess 2/21.   Met with pt in room today. Pt reports poor appetite and decreased oral intake for 3 weeks pta. Pt reports nausea with vomiting and diarrhea for 1 week pta. Pt reports that he has lost 10lbs(6%) in the past month. This is significant given history. Pt s/p I & D of anterior wall abscess 2/21. RD spoke to Dr. Hassell Done regarding TPN for this pt. MD is concerned about possible fistula for this pt and would like for pt to have bowel rest with possible diet advancement in 2-3 days pending repeat CT results. Plan is to avoid surgery, provide bowel rest, provide TPN, and possibly advance to clear liquid diet in a few days if pt tolerating. Pt with low potassium and magnesium today. Recommend repletion of electrolytes prior to TPN initiation.   Medications reviewed and include: heparin, insulin, protonix, zosyn  Labs reviewed: K 2.7(L), Ca 7.5(L) adj. 9.02 wnl, Mg 1.4(L), P 2.6(L), Alb 2.1(L) Hgb 9.5(L), Hct 28.9(L)  Nutrition-Focused physical exam completed. Findings are moderate to severe fat depletion, moderate to  severe muscle depletion, and no edema.   Diet Order:  Diet NPO time specified TPN (CLINIMIX-E) Adult  Skin:  Wound (see comment) (abdomen incision)  Last BM:  2/21  Height:   Ht Readings from Last 1 Encounters:  01/04/17 _0  (1.676 m)    Weight:   Wt Readings from Last 1 Encounters:  01/04/17 155 lb 4.8 oz (70.4 kg)    Ideal Body Weight:  64.5 kg  BMI:  Body mass index is 25.07 kg/m.  Estimated Nutritional Needs:   Kcal:  1800-2100kcal/day   Protein:  113-130g/day   Fluid:  >1.8L/day   EDUCATION NEEDS:   No education needs identified at this time  Koleen Distance, RD, LDN Pager #[phone]

## 2017-01-04 NOTE — Progress Notes (Signed)
Peripherally Inserted Central Catheter/Midline Placement  The IV Nurse has discussed with the patient and/or persons authorized to consent for the patient, the purpose of this procedure and the potential benefits and risks involved with this procedure.  The benefits include less needle sticks, lab draws from the catheter, and the patient may be discharged home with the catheter. Risks include, but not limited to, infection, bleeding, blood clot (thrombus formation), and puncture of an artery; nerve damage and irregular heartbeat and possibility to perform a PICC exchange if needed/ordered by physician.  Alternatives to this procedure were also discussed.  Bard Power PICC patient education guide, fact sheet on infection prevention and patient information card has been provided to patient /or left at bedside.    PICC/Midline Placement Documentation  PICC Double Lumen [id number] PICC Right Basilic 44 cm 1.5 cm (Active)  Indication for Insertion or Continuance of Line Administration of hyperosmolar/irritating solutions (i.e. TPN, Vancomycin, etc.) 01/04/2017 10:28 AM  Exposed Catheter (cm) 1.5 cm 01/04/2017 10:28 AM  Dressing Change Due 01/11/17 01/04/2017 10:28 AM       [name] 01/04/2017, 10:29 AM

## 2017-01-05 LAB — COMPREHENSIVE METABOLIC PANEL
ALT: 9 U/L — AB (ref 17–63)
AST: 12 U/L — ABNORMAL LOW (ref 15–41)
Albumin: 2 g/dL — ABNORMAL LOW (ref 3.5–5.0)
Alkaline Phosphatase: 51 U/L (ref 38–126)
Anion gap: 4 — ABNORMAL LOW (ref 5–15)
BUN: <5 mg/dL — ABNORMAL LOW (ref 6–20)
CHLORIDE: 112 mmol/L — AB (ref 101–111)
CO2: 25 mmol/L (ref 22–32)
CREATININE: 0.92 mg/dL (ref 0.61–1.24)
Calcium: 7.2 mg/dL — ABNORMAL LOW (ref 8.9–10.3)
GFR calc Af Amer: >60 mL/min (ref >60)
GFR calc non Af Amer: >60 mL/min (ref >60)
Glucose, Bld: 127 mg/dL — ABNORMAL HIGH (ref 65–99)
Potassium: 3.1 mmol/L — ABNORMAL LOW (ref 3.5–5.1)
SODIUM: 141 mmol/L (ref 135–145)
Total Bilirubin: 0.5 mg/dL (ref 0.3–1.2)
Total Protein: 5.6 g/dL — ABNORMAL LOW (ref 6.5–8.1)

## 2017-01-05 LAB — DIFFERENTIAL
BASOS ABS: 0 10*3/uL (ref 0.0–0.1)
BASOS PCT: 0 %
EOS ABS: 0.1 10*3/uL (ref 0.0–0.7)
Eosinophils Relative: 1 %
LYMPHS ABS: 0.9 10*3/uL (ref 0.7–4.0)
Lymphocytes Relative: 16 %
Monocytes Absolute: 0.5 10*3/uL (ref 0.1–1.0)
Monocytes Relative: 8 %
NEUTROS PCT: 75 %
Neutro Abs: 4.3 10*3/uL (ref 1.7–7.7)

## 2017-01-05 LAB — TRIGLYCERIDES: Triglycerides: 167 mg/dL — ABNORMAL HIGH (ref <150)

## 2017-01-05 LAB — CBC
HCT: 29.3 % — ABNORMAL LOW (ref 39.0–52.0)
Hemoglobin: 9.6 g/dL — ABNORMAL LOW (ref 13.0–17.0)
MCH: 28.7 pg (ref 26.0–34.0)
MCHC: 32.8 g/dL (ref 30.0–36.0)
MCV: 87.5 fL (ref 78.0–100.0)
PLATELETS: 268 10*3/uL (ref 150–400)
RBC: 3.35 MIL/uL — AB (ref 4.22–5.81)
RDW: 16 % — ABNORMAL HIGH (ref 11.5–15.5)
WBC: 5.8 10*3/uL (ref 4.0–10.5)

## 2017-01-05 LAB — GLUCOSE, CAPILLARY
GLUCOSE-CAPILLARY: 159 mg/dL — AB (ref 65–99)
GLUCOSE-CAPILLARY: 125 mg/dL — AB (ref 65–99)
GLUCOSE-CAPILLARY: 144 mg/dL — AB (ref 65–99)
Glucose-Capillary: 102 mg/dL — ABNORMAL HIGH (ref 65–99)
Glucose-Capillary: 148 mg/dL — ABNORMAL HIGH (ref 65–99)
Glucose-Capillary: 176 mg/dL — ABNORMAL HIGH (ref 65–99)

## 2017-01-05 LAB — MAGNESIUM: Magnesium: 2 mg/dL (ref 1.7–2.4)

## 2017-01-05 LAB — PHOSPHORUS: PHOSPHORUS: 2.2 mg/dL — AB (ref 2.5–4.6)

## 2017-01-05 LAB — PREALBUMIN: Prealbumin: 7.2 mg/dL — ABNORMAL LOW (ref 18–38)

## 2017-01-05 MED ORDER — POTASSIUM PHOSPHATES 15 MMOLE/5ML IV SOLN
10.0000 mmol | Freq: Once | INTRAVENOUS | Status: AC
  Administered 2017-01-05: 10 mmol via INTRAVENOUS
  Filled 2017-01-05: qty 3.33

## 2017-01-05 MED ORDER — TRACE MINERALS CR-CU-MN-SE-ZN 10-1000-500-60 MCG/ML IV SOLN
INTRAVENOUS | Status: AC
  Administered 2017-01-05: 18:00:00 via INTRAVENOUS
  Filled 2017-01-05: qty 1440

## 2017-01-05 MED ORDER — KCL IN DEXTROSE-NACL 20-5-0.45 MEQ/L-%-% IV SOLN
INTRAVENOUS | Status: AC
  Administered 2017-01-06 (×2): via INTRAVENOUS
  Filled 2017-01-05: qty 1000

## 2017-01-05 MED ORDER — FAT EMULSION 20 % IV EMUL
250.0000 mL | INTRAVENOUS | Status: AC
  Administered 2017-01-05: 250 mL via INTRAVENOUS
  Filled 2017-01-05: qty 250

## 2017-01-05 MED ORDER — INSULIN ASPART 100 UNIT/ML ~~LOC~~ SOLN
0.0000 [IU] | Freq: Four times a day (QID) | SUBCUTANEOUS | Status: DC
  Administered 2017-01-05: 1 [IU] via SUBCUTANEOUS

## 2017-01-05 MED ORDER — POTASSIUM CHLORIDE 10 MEQ/100ML IV SOLN
10.0000 meq | INTRAVENOUS | Status: AC
  Administered 2017-01-05 (×6): 10 meq via INTRAVENOUS
  Filled 2017-01-05 (×6): qty 100

## 2017-01-05 NOTE — Progress Notes (Signed)
Spoke with patient at bedside. Patient recently with PICC place and TNA started. Patient lives at home with spouse who he indicates is very supportive. Discussed the possibility of home TNA, patient states he and his wife could manage that. Wound care is still very painful. Per patient, will need further testing to decide if long term TNA, will f/u with him on Monday.

## 2017-01-05 NOTE — Progress Notes (Signed)
PHARMACY - ADULT TOTAL PARENTERAL NUTRITION CONSULT NOTE   Pharmacy Consult for TPN Indication: EC fistula  Patient Measurements: Height: 5\' 6"  (167.6 cm) Weight: 144 lb 11.2 oz (65.6 kg) IBW/kg (Calculated) : 63.8wt: 70.4 kg TPN AdjBW (KG): 70.4 Insulin Requirements: 6 units yesterday  Current Nutrition: NPO  IVF: D5 1/2 NS w/ 34meq KCl/L @ 57mls/hr  Central access: PICC placed 2/22 TPN start date: 2/22  ASSESSMENT                                                                                                          HPI:  61 y.o. male with longstanding history of Crohn's.  About 1.5 years ago he underwent closure of an ileostomy and placement of alloderm.  He did well until a few days ago when he had drainage from his wound.   S/p I&D abdominal wall abscess 01/03/17  Significant events:   Today:    Glucose - 159, goal <150  Electrolytes - K low, Phos low,Mag WNL after replenishment,  CCa WNL, all others WNL  Renal - WNL  LFTs - low  TGs - 167  Prealbumin - 7.2  NUTRITIONAL GOALS                                                                                             RD recs: Protein:113-130 g/day   Kcal: 1800-2100 Kcal/day Clinimix E 5/15 at a goal rate of 60ml/hr + 20% fat emulsion at 37ml/hr over 12 hours to provide: 99.6 g/day protein, 1912 Kcal/day. +++ clinimix is on Psychologist, prison and probation services, pharmacy will use product based on availability++ PLAN  KCl 46meq x 6, KPhos 82mmol x 1                                                                                                                        At 1800 today:  increase Clinimix E 5/15 to 58ml/hr.  20% fat emulsion at 21ml/hr over 12 hours  Plan to advance as tolerated to the goal rate.  TPN to contain standard multivitamins and trace elements.  Reduce IVF to 64ml/hr.  Change to sensitive SSI q6h  BMet with mag and phos with  am labs  TPN lab panels on Mondays & Thursdays.  F/u daily.  Dolly Rias RPh 01/05/2017, 12:47 PM Pager [phone]

## 2017-01-05 NOTE — Progress Notes (Signed)
Pt mentioned that his wound needed dressing changes.  There was no order, but I saw in the notes that they were mentioned TID. Pt told me how the physician did the dressing.  Per pt I applied iodiphore that were in the bottle, and covered with a dry gauze. We need orders to do the dressing changes regularly.

## 2017-01-05 NOTE — Progress Notes (Signed)
2 Days Post-Op  Subjective: Both wounds taken down and examined. The open area on the right is his former ileostomy site. This has less drainage today, but more than the one on the left. There is suture at the base of this. Drainage is purulent and white in color from both sides. The site on the left has less drainage today cleaning granular pink colored. It also has suture that appears to be Vicryl at the base. This is also the very top of the abdominal incision.  Objective: Vital signs in last 24 hours: Temp:  [97.7 F (36.5 C)-98.1 F (36.7 C)] 97.8 F (36.6 C) (02/23 0447) Pulse Rate:  [65-68] 67 (02/23 0447) Resp:  [16] 16 (02/23 0447) BP: (105-114)/(63-70) 105/63 (02/23 0447) SpO2:  [98 %-100 %] 100 % (02/23 0447) Weight:  [65.6 kg (144 lb 11.2 oz)] 65.6 kg (144 lb 11.2 oz) (02/22 1500) Last BM Date: 01/03/17  Intake/Output from previous day: 02/22 0701 - 02/23 0700 In: 2108.2 [I.V.:1497.2; IV Piggyback:611] Out: 2125 [Urine:2125] Intake/Output this shift: Total I/O In: 0  Out: 425 [Urine:425]  General appearance: alert, cooperative and no distress GI: soft, non-tender; bowel sounds normal; no masses,  no organomegaly and Open sites as noted above. Both have less drainage than before the one on the right the old ileostomy site has more drainage than the one on the left. There are suture at the base of each of these.  Lab Results:   Recent Labs  01/04/17 0446 01/05/17 0440  WBC 7.8 5.8  HGB 9.5* 9.6*  HCT 28.9* 29.3*  PLT 255 268    BMET  Recent Labs  01/04/17 0446 01/05/17 0440  NA 139 141  K 2.7* 3.1*  CL 109 112*  CO2 25 25  GLUCOSE 167* 127*  BUN 10 <5*  CREATININE 0.97 0.92  CALCIUM 7.5* 7.2*   PT/INR No results for input(s): LABPROT, INR in the last 72 hours.   Recent Labs Lab 01/03/17 1337 01/04/17 0446 01/05/17 0440  AST 11* 12* 12*  ALT 10* 9* 9*  ALKPHOS 68 53 51  BILITOT 0.9 0.4 0.5  PROT 7.0 5.5* 5.6*  ALBUMIN 2.6* 2.1* 2.0*       Lipase     Component Value Date/Time   LIPASE 24 02/02/2009 0643     Studies/Results: No results found.  Medications: . heparin  5,000 Units Subcutaneous Q8H  . insulin aspart  0-15 Units Subcutaneous Q6H  . pantoprazole (PROTONIX) IV  40 mg Intravenous QHS  . piperacillin-tazobactam (ZOSYN)  IV  3.375 g Intravenous Q8H  . potassium chloride  10 mEq Intravenous Q1 Hr x 6  . potassium phosphate IVPB (mmol)  10 mmol Intravenous Once   . dextrose 5 % and 0.45 % NaCl with KCl 20 mEq/L 85 mL/hr at 01/05/17 0628  . Marland KitchenTPN (CLINIMIX-E) Adult 40 mL/hr at 01/04/17 1740   Specimen Description ABDOMEN ABSCESS   Special Requests NONE   Gram Stain ABUNDANT WBC PRESENT,BOTH PMN AND MONONUCLEAR  MODERATE GRAM POSITIVE COCCI IN PAIRS IN CHAINS  FEW GRAM VARIABLE ROD  Performed at [facility], [place] [address] [place], [place] [address]      Culture MULTIPLE ORGANISMS PRESENT, NONE PREDOMINANT NO ANAEROBES ISOLATED; CULTURE IN PROGRESS FOR 5 DAYS       Assessment/Plan 1.  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 Johnathan Hausen POD  #2 Growing multiple organisms  2.  HX ofCrohn's  disease with terminal ileal resection done at [facility] in [place] disease with anastomotic inflammatory mass consistent with Crohns and enterofistula.  3.  S/p Exploratory laparotomy with enterolysis, resection of inflammatory mass in right lower quadrant, and take-down of fistula with enteroenterostomy and resection with the small bowel to ascending colon anastomosis, 10/29/09, DR. Martin/ Post op bleed with reexploration that PM 4.   Attempted laparoscopy, laparotomy with extensive enterolysis (2.5 hours) and takedown of paraileostomy hernia, creation of distal small bowel to transverse colon anastomosis, repair of ventral hernia with 20 x 30 cm Strattice mesh, 07/23/15, DR. Martin  5.   Hx of recurrent nephrolithiasis, urolithiasis, acute pyelonephritis 6.  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 7.  Hypokalemia/Hypomagnesemia - being replaced FEN:  IV fluids/NPO -   TNA ordered ID:  Zosyn 01/03/17 =>> day 3 DVT:  Heparin     Plan: Currently both wounds appear to be improving. There is no evidence or indication of fecal contamination.   Patient is stable on TNA and dressing changes 3 times a day.   I will check on starting clear liquids with Dr. Lucia Gaskins. Continue TNA for now.   Discussed repeating CT scan early next week. We will also need to discuss follow-up with GI/Dr. Laurence Spates.    LOS: 2 days    JENNINGS,WILLARD 01/05/2017 [phone]  Agree with above. Doing well.  Wife in room.  He got up and walked around the hall 20 times.  So far, no obvious enteric contents. Will give clear liquids starting in AM - plan repeat CT scan early next week (5 to 7 days after last CT scan).  Alphonsa Overall, MD, [facility] Surgery Pager: [phone] Office phone:  [phone]

## 2017-01-06 LAB — BASIC METABOLIC PANEL
Anion gap: 5 (ref 5–15)
CHLORIDE: 110 mmol/L (ref 101–111)
CO2: 26 mmol/L (ref 22–32)
CREATININE: 0.74 mg/dL (ref 0.61–1.24)
Calcium: 7.5 mg/dL — ABNORMAL LOW (ref 8.9–10.3)
GFR calc Af Amer: >60 mL/min (ref >60)
GFR calc non Af Amer: >60 mL/min (ref >60)
Glucose, Bld: 169 mg/dL — ABNORMAL HIGH (ref 65–99)
POTASSIUM: 3 mmol/L — AB (ref 3.5–5.1)
Sodium: 141 mmol/L (ref 135–145)

## 2017-01-06 LAB — GLUCOSE, CAPILLARY
GLUCOSE-CAPILLARY: 131 mg/dL — AB (ref 65–99)
GLUCOSE-CAPILLARY: 161 mg/dL — AB (ref 65–99)
Glucose-Capillary: 160 mg/dL — ABNORMAL HIGH (ref 65–99)
Glucose-Capillary: 150 mg/dL — ABNORMAL HIGH (ref 65–99)
Glucose-Capillary: 175 mg/dL — ABNORMAL HIGH (ref 65–99)
Glucose-Capillary: 126 mg/dL — ABNORMAL HIGH (ref 65–99)

## 2017-01-06 LAB — PHOSPHORUS: Phosphorus: 2.5 mg/dL (ref 2.5–4.6)

## 2017-01-06 LAB — MAGNESIUM: Magnesium: 1.9 mg/dL (ref 1.7–2.4)

## 2017-01-06 MED ORDER — M.V.I. ADULT IV INJ
INJECTION | INTRAVENOUS | Status: DC
  Filled 2017-01-06: qty 1992

## 2017-01-06 MED ORDER — TRACE MINERALS CR-CU-MN-SE-ZN 10-1000-500-60 MCG/ML IV SOLN
INTRAVENOUS | Status: AC
  Administered 2017-01-06: 18:00:00 via INTRAVENOUS
  Filled 2017-01-06: qty 1992

## 2017-01-06 MED ORDER — POTASSIUM CHLORIDE 10 MEQ/100ML IV SOLN
10.0000 meq | INTRAVENOUS | Status: AC
  Administered 2017-01-06 (×6): 10 meq via INTRAVENOUS
  Filled 2017-01-06 (×6): qty 100

## 2017-01-06 MED ORDER — MAGNESIUM SULFATE 2 GM/50ML IV SOLN
2.0000 g | Freq: Once | INTRAVENOUS | Status: AC
  Administered 2017-01-06: 2 g via INTRAVENOUS
  Filled 2017-01-06: qty 50

## 2017-01-06 MED ORDER — INSULIN ASPART 100 UNIT/ML ~~LOC~~ SOLN
0.0000 [IU] | SUBCUTANEOUS | Status: DC
  Administered 2017-01-06: 2 [IU] via SUBCUTANEOUS
  Administered 2017-01-06: 1 [IU] via SUBCUTANEOUS

## 2017-01-06 MED ORDER — FAT EMULSION 20 % IV EMUL
240.0000 mL | INTRAVENOUS | Status: AC
  Administered 2017-01-06: 240 mL via INTRAVENOUS
  Filled 2017-01-06: qty 250

## 2017-01-06 MED ORDER — INSULIN ASPART 100 UNIT/ML ~~LOC~~ SOLN
0.0000 [IU] | Freq: Four times a day (QID) | SUBCUTANEOUS | Status: DC
  Administered 2017-01-06 (×2): 1 [IU] via SUBCUTANEOUS
  Administered 2017-01-07: 3 [IU] via SUBCUTANEOUS
  Administered 2017-01-07: 2 [IU] via SUBCUTANEOUS

## 2017-01-06 MED ORDER — FAT EMULSION 20 % IV EMUL
250.0000 mL | INTRAVENOUS | Status: DC
  Filled 2017-01-06: qty 250

## 2017-01-06 MED ORDER — KCL IN DEXTROSE-NACL 20-5-0.45 MEQ/L-%-% IV SOLN
INTRAVENOUS | Status: DC
  Administered 2017-01-07 – 2017-01-08 (×2): via INTRAVENOUS
  Administered 2017-01-09: 40 mL/h via INTRAVENOUS
  Filled 2017-01-06 (×4): qty 1000

## 2017-01-06 NOTE — Progress Notes (Signed)
3 Days Post-Op I&D Subjective: Feeling better Objective: Vital signs in last 24 hours: Temp:  [97.8 F (36.6 C)-98.1 F (36.7 C)] 97.8 F (36.6 C) (02/24 0459) Pulse Rate:  [57-63] 57 (02/24 0459) Resp:  [14-16] 14 (02/24 0459) BP: (97-109)/(60-67) 97/60 (02/24 0459) SpO2:  [100 %] 100 % (02/24 0459) Last BM Date: 01/05/17  Intake/Output from previous day: 02/23 0701 - 02/24 0700 In: 2276.7 [I.V.:2176.7; IV Piggyback:100] Out: [id number] [Urine:1775] Intake/Output this shift: No intake/output data recorded.  General appearance: alert, cooperative and no distress GI: soft, non-tender; no masses,  no organomegaly.  Open sites packed with no bilious output.   Lab Results:   Recent Labs  01/04/17 0446 01/05/17 0440  WBC 7.8 5.8  HGB 9.5* 9.6*  HCT 28.9* 29.3*  PLT 255 268    BMET  Recent Labs  01/05/17 0440 01/06/17 0544  NA 141 141  K 3.1* 3.0*  CL 112* 110  CO2 25 26  GLUCOSE 127* 169*  BUN <5* <5*  CREATININE 0.92 0.74  CALCIUM 7.2* 7.5*   PT/INR No results for input(s): LABPROT, INR in the last 72 hours.   Recent Labs Lab 01/03/17 1337 01/04/17 0446 01/05/17 0440  AST 11* 12* 12*  ALT 10* 9* 9*  ALKPHOS 68 53 51  BILITOT 0.9 0.4 0.5  PROT 7.0 5.5* 5.6*  ALBUMIN 2.6* 2.1* 2.0*     Lipase     Component Value Date/Time   LIPASE 24 02/02/2009 0643     Studies/Results: No results found.  Medications: . heparin  5,000 Units Subcutaneous Q8H  . insulin aspart  0-9 Units Subcutaneous Q4H  . pantoprazole (PROTONIX) IV  40 mg Intravenous QHS  . piperacillin-tazobactam (ZOSYN)  IV  3.375 g Intravenous Q8H   . dextrose 5 % and 0.45 % NaCl with KCl 20 mEq/L 65 mL/hr at 01/06/17 0655  . Marland KitchenTPN (CLINIMIX-E) Adult 60 mL/hr at 01/06/17 0600   Specimen Description ABDOMEN ABSCESS   Special Requests NONE   Gram Stain ABUNDANT WBC PRESENT,BOTH PMN AND MONONUCLEAR  MODERATE GRAM POSITIVE COCCI IN PAIRS IN CHAINS  FEW GRAM VARIABLE ROD  Performed at [facility], [place] [address] [place], [place] [address]      Culture MULTIPLE ORGANISMS PRESENT, NONE PREDOMINANT NO ANAEROBES ISOLATED; CULTURE IN PROGRESS FOR 5 DAYS       Assessment/Plan 1.  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 Johnathan Hausen POD  #3 Growing multiple organisms  2.  HX of Crohn's disease with terminal ileal resection done at [facility] in 1983   3.  S/p Exploratory laparotomy with enterolysis, resection of inflammatory mass in right lower quadrant, and take-down of fistula with enteroenterostomy and resection with the small bowel to ascending colon anastomosis, 10/29/09, DR. Martin/ Post op bleed with reexploration that PM 4.   Attempted laparoscopy, laparotomy with extensive enterolysis (2.5 hours) and takedown of paraileostomy hernia, creation of distal small bowel to transverse colon anastomosis, repair of ventral hernia with 20 x 30 cm Strattice mesh, 07/23/15, DR. Martin  5.  Hx of recurrent nephrolithiasis, urolithiasis, acute pyelonephritis 6.  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 7.  Hypokalemia/Hypomagnesemia - replace in TPN FEN:  IV fluids/NPO -   TNA ordered ID:  Zosyn 01/03/17 =>> day 3 DVT:  Heparin     Plan: Currently both wounds appear to be improving.  There is no evidence or indication of fecal contamination.   Patient is stable on TNA and dressing changes 3 times a day.   Cont clears. Continue TNA for now.   Repeat CT scan early next week. We will also need to discuss follow-up with GI/Dr. Laurence Spates.    LOS: 3 days    Viktoriya Glaspy C. [id number]

## 2017-01-06 NOTE — Progress Notes (Signed)
PHARMACY - ADULT TOTAL PARENTERAL NUTRITION CONSULT NOTE   Pharmacy Consult for TPN Indication: EC fistula  Patient Measurements: Height: 5\' 6"  (167.6 cm) Weight: 144 lb 11.2 oz (65.6 kg) IBW/kg (Calculated) : 63.8wt: 70.4 kg TPN AdjBW (KG): 70.4 Insulin Requirements: 8 units in last 24 hours  Current Nutrition: NPO  IVF: D5 1/2 NS w/ 67meq KCl/L @ 58mls/hr  Central access: PICC placed 2/22 TPN start date: 2/22  ASSESSMENT                                                                                                          HPI:  61 y.o. male with longstanding history of Crohn's.  About 1.5 years ago he underwent closure of an ileostomy and placement of alloderm.  He did well until a few days ago when he had drainage from his wound.   S/p I&D abdominal wall abscess 01/03/17  Significant events:   Today:    Glucose - 102-160, goal <150  Electrolytes - K low at 3.0, Phos 2.5 ,Mag  1.9 after replenishment,  CCa WNL, all others WNL  Renal - WNL  LFTs - low  TGs - 167 ( 2/23)  Prealbumin - 7.2 ( 2/23)  NUTRITIONAL GOALS                                                                                             RD recs: Protein:113-130 g/day   Kcal: 1800-2100 Kcal/day Clinimix E 5/15 at a goal rate of 72ml/hr + 20% fat emulsion at 59ml/hr over 12 hours to provide: 99.6 g/day protein, 1912 Kcal/day. +++ clinimix is on Psychologist, prison and probation services, pharmacy will use product based on availability++ PLAN  KCl 17meq x 6,  Magnesium Sulfate 2 gr IV x1                                                                                                                         At 1800 today:  increase Clinimix E 5/15 to 86ml/hr, the target rate  20% fat emulsion at 38ml/hr over 12 hours  TPN to contain standard multivitamins and trace elements.  Reduce IVF to 40 ml/hr at 1800  Due  to inconsistency between FBG labs and SSI times, times were changed to q4h during the night  Change to  sensitive SSI q6h  BMet with mag and phos with am labs  TPN lab panels on Mondays & Thursdays.  F/u daily.   Royetta Asal, PharmD, BCPS Pager [phone] 01/06/2017 8:44 AM

## 2017-01-07 LAB — BASIC METABOLIC PANEL
Anion gap: 7 (ref 5–15)
BUN: 6 mg/dL (ref 6–20)
CHLORIDE: 107 mmol/L (ref 101–111)
CO2: 25 mmol/L (ref 22–32)
CREATININE: 0.95 mg/dL (ref 0.61–1.24)
Calcium: 7.5 mg/dL — ABNORMAL LOW (ref 8.9–10.3)
GFR calc Af Amer: >60 mL/min (ref >60)
GFR calc non Af Amer: >60 mL/min (ref >60)
GLUCOSE: 188 mg/dL — AB (ref 65–99)
POTASSIUM: 3.4 mmol/L — AB (ref 3.5–5.1)
SODIUM: 139 mmol/L (ref 135–145)

## 2017-01-07 LAB — MAGNESIUM: Magnesium: 1.9 mg/dL (ref 1.7–2.4)

## 2017-01-07 LAB — GLUCOSE, CAPILLARY
GLUCOSE-CAPILLARY: 199 mg/dL — AB (ref 65–99)
GLUCOSE-CAPILLARY: 164 mg/dL — AB (ref 65–99)
Glucose-Capillary: 207 mg/dL — ABNORMAL HIGH (ref 65–99)

## 2017-01-07 LAB — PHOSPHORUS: PHOSPHORUS: 2.3 mg/dL — AB (ref 2.5–4.6)

## 2017-01-07 MED ORDER — POTASSIUM CHLORIDE 10 MEQ/100ML IV SOLN
10.0000 meq | INTRAVENOUS | Status: AC
  Administered 2017-01-07 (×2): 10 meq via INTRAVENOUS
  Filled 2017-01-07 (×2): qty 100

## 2017-01-07 MED ORDER — FAT EMULSION 20 % IV EMUL
240.0000 mL | INTRAVENOUS | Status: AC
  Administered 2017-01-07: 240 mL via INTRAVENOUS
  Filled 2017-01-07: qty 250

## 2017-01-07 MED ORDER — TRACE MINERALS CR-CU-MN-SE-ZN 10-1000-500-60 MCG/ML IV SOLN
INTRAVENOUS | Status: AC
  Administered 2017-01-07: 18:00:00 via INTRAVENOUS
  Filled 2017-01-07: qty 1992

## 2017-01-07 MED ORDER — INSULIN ASPART 100 UNIT/ML ~~LOC~~ SOLN
0.0000 [IU] | Freq: Four times a day (QID) | SUBCUTANEOUS | Status: DC
  Administered 2017-01-07 (×2): 3 [IU] via SUBCUTANEOUS
  Administered 2017-01-08: 5 [IU] via SUBCUTANEOUS
  Administered 2017-01-08: 3 [IU] via SUBCUTANEOUS

## 2017-01-07 MED ORDER — MAGNESIUM SULFATE 2 GM/50ML IV SOLN
2.0000 g | Freq: Once | INTRAVENOUS | Status: AC
  Administered 2017-01-07: 2 g via INTRAVENOUS
  Filled 2017-01-07: qty 50

## 2017-01-07 MED ORDER — POTASSIUM PHOSPHATES 15 MMOLE/5ML IV SOLN
10.0000 mmol | Freq: Once | INTRAVENOUS | Status: AC
  Administered 2017-01-07: 10 mmol via INTRAVENOUS
  Filled 2017-01-07: qty 3.33

## 2017-01-07 NOTE — Progress Notes (Signed)
PHARMACY - ADULT TOTAL PARENTERAL NUTRITION CONSULT NOTE   Pharmacy Consult for TPN Indication: EC fistula  Patient Measurements: Height: 5\' 6"  (167.6 cm) Weight: 144 lb 11.2 oz (65.6 kg) IBW/kg (Calculated) : 63.8wt: 70.4 kg TPN AdjBW (KG): 70.4 Insulin Requirements: 7 units in last 24 hours  Current Nutrition: Pt tolerating clear liquids  IVF: D5 1/2 NS w/ 67meq KCl/L @ 40 mls/hr  Central access: PICC placed 2/22 TPN start date: 2/22  ASSESSMENT                                                                                                          HPI:  61 y.o. male with longstanding history of Crohn's.  About 1.5 years ago he underwent closure of an ileostomy and placement of alloderm.  He did well until a few days ago when he had drainage from his wound.   S/p I&D abdominal wall abscess 01/03/17  Significant events:   Today:    Glucose - 131-207, goal <150  Electrolytes - K low at 3.4, improving after replacement, Phos 2.3 ,Mag  1.9 after replenishment,  CCa WNL, all others WNL  Renal - WNL  LFTs - low  TGs - 167 ( 2/23)  Prealbumin - 7.2 ( 2/23)  NUTRITIONAL GOALS                                                                                             RD recs: Protein:113-130 g/day   Kcal: 1800-2100 Kcal/day Clinimix E 5/15 at a goal rate of 51ml/hr + 20% fat emulsion at 24ml/hr over 12 hours to provide: 99.6 g/day protein, 1912 Kcal/day. +++ clinimix is on Psychologist, prison and probation services, pharmacy will use product based on availability++ PLAN  KCl 49meq x 2,  Potassium Phosphate 10 mmol x1  Magnesium Sulfate 2 gr IV x1                                                                                                                         At 1800 today:  Continue Clinimix E 5/15 to 66ml/hr, the target rate  20% fat emulsion at 8ml/hr over 12 hours  TPN to contain standard multivitamins  and trace elements.  Continue IVF to 40 ml/hr at 1800  Change to moderate SSI  q6h  TPN lab panels on Mondays & Thursdays.  F/u daily.   Royetta Asal, PharmD, BCPS Pager [phone] 01/07/2017 10:07 AM

## 2017-01-07 NOTE — Progress Notes (Signed)
4 Days Post-Op I&D Subjective: Feeling better.  Tolerating clears Objective: Vital signs in last 24 hours: Temp:  [97.9 F (36.6 C)-98.3 F (36.8 C)] 98.3 F (36.8 C) (02/25 0502) Pulse Rate:  [64-74] 64 (02/25 0502) Resp:  [16] 16 (02/25 0502) BP: (102-111)/(55-61) 111/61 (02/25 0502) SpO2:  [97 %-100 %] 98 % (02/25 0502) Last BM Date: 01/06/17  Intake/Output from previous day: 02/24 0701 - 02/25 0700 In: 2346 [P.O.:480; I.V.:1716; IV Piggyback:150] Out: 3225 [Urine:3225] Intake/Output this shift: Total I/O In: -  Out: 800 [Urine:800]  General appearance: alert, cooperative and no distress GI: soft, non-tender; no masses,  no organomegaly.  Open sites packed with no bilious output.   Lab Results:   Recent Labs  01/05/17 0440  WBC 5.8  HGB 9.6*  HCT 29.3*  PLT 268    BMET  Recent Labs  01/06/17 0544 01/07/17 0454  NA 141 139  K 3.0* 3.4*  CL 110 107  CO2 26 25  GLUCOSE 169* 188*  BUN <5* 6  CREATININE 0.74 0.95  CALCIUM 7.5* 7.5*   PT/INR No results for input(s): LABPROT, INR in the last 72 hours.   Recent Labs Lab 01/03/17 1337 01/04/17 0446 01/05/17 0440  AST 11* 12* 12*  ALT 10* 9* 9*  ALKPHOS 68 53 51  BILITOT 0.9 0.4 0.5  PROT 7.0 5.5* 5.6*  ALBUMIN 2.6* 2.1* 2.0*     Lipase     Component Value Date/Time   LIPASE 24 02/02/2009 0643     Studies/Results: No results found.  Medications: . heparin  5,000 Units Subcutaneous Q8H  . insulin aspart  0-9 Units Subcutaneous Q6H  . pantoprazole (PROTONIX) IV  40 mg Intravenous QHS  . piperacillin-tazobactam (ZOSYN)  IV  3.375 g Intravenous Q8H   . Marland KitchenTPN (CLINIMIX-E) Adult 83 mL/hr at 01/06/17 1756  . dextrose 5 % and 0.45 % NaCl with KCl 20 mEq/L 40 mL/hr at 01/06/17 1800   Specimen Description ABDOMEN ABSCESS   Special Requests NONE   Gram Stain ABUNDANT WBC PRESENT,BOTH PMN AND MONONUCLEAR  MODERATE GRAM POSITIVE COCCI IN PAIRS IN CHAINS  FEW GRAM VARIABLE ROD  Performed at [facility], [place] [address], [place], [place] [address]      Culture MULTIPLE ORGANISMS PRESENT, NONE PREDOMINANT NO ANAEROBES ISOLATED; CULTURE IN PROGRESS FOR 5 DAYS       Assessment/Plan 1.  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 Johnathan Hausen POD  #4 Growing multiple organisms  2.  HX of Crohn's disease with terminal ileal resection done at [facility] in 1983   3.  S/p Exploratory laparotomy with enterolysis, resection of inflammatory mass in right lower quadrant, and take-down of fistula with enteroenterostomy and resection with the small bowel to ascending colon anastomosis, 10/29/09, DR. Martin/ Post op bleed with reexploration that PM 4.   Attempted laparoscopy, laparotomy with extensive enterolysis (2.5 hours) and takedown of paraileostomy hernia, creation of distal small bowel to transverse colon anastomosis, repair of ventral hernia with 20 x 30 cm Strattice mesh, 07/23/15, DR. Martin  5.  Hx of recurrent nephrolithiasis, urolithiasis, acute pyelonephritis 6.  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 7.  Hypokalemia/Hypomagnesemia - replace in TPN FEN:  IV fluids/NPO -   TNA ordered ID:  Zosyn 01/03/17 =>> day 3 DVT:  Heparin     Plan: Currently both wounds appear  to be improving. There is no evidence or indication of fecal contamination.   Patient is stable on TNA and dressing changes 2 times a day.   Cont clears. Continue TNA for now.   Repeat CT scan early next week. We will also need to discuss follow-up with GI/Dr. Laurence Spates.    LOS: 4 days    Jacy Howat C. [phone]

## 2017-01-08 LAB — DIFFERENTIAL
BASOS PCT: 0 %
Basophils Absolute: 0 10*3/uL (ref 0.0–0.1)
EOS ABS: 0.2 10*3/uL (ref 0.0–0.7)
EOS PCT: 4 %
Lymphocytes Relative: 24 %
Lymphs Abs: 1.3 10*3/uL (ref 0.7–4.0)
MONO ABS: 0.5 10*3/uL (ref 0.1–1.0)
MONOS PCT: 8 %
Neutro Abs: 3.5 10*3/uL (ref 1.7–7.7)
Neutrophils Relative %: 64 %

## 2017-01-08 LAB — GLUCOSE, CAPILLARY
GLUCOSE-CAPILLARY: 231 mg/dL — AB (ref 65–99)
GLUCOSE-CAPILLARY: 297 mg/dL — AB (ref 65–99)
GLUCOSE-CAPILLARY: 252 mg/dL — AB (ref 65–99)
GLUCOSE-CAPILLARY: 336 mg/dL — AB (ref 65–99)
Glucose-Capillary: 174 mg/dL — ABNORMAL HIGH (ref 65–99)

## 2017-01-08 LAB — COMPREHENSIVE METABOLIC PANEL
ALBUMIN: 2.2 g/dL — AB (ref 3.5–5.0)
ALK PHOS: 46 U/L (ref 38–126)
ALT: 6 U/L — AB (ref 17–63)
AST: 11 U/L — ABNORMAL LOW (ref 15–41)
Anion gap: 6 (ref 5–15)
BILIRUBIN TOTAL: 0.2 mg/dL — AB (ref 0.3–1.2)
BUN: 9 mg/dL (ref 6–20)
CALCIUM: 7.7 mg/dL — AB (ref 8.9–10.3)
CO2: 24 mmol/L (ref 22–32)
CREATININE: 0.83 mg/dL (ref 0.61–1.24)
Chloride: 108 mmol/L (ref 101–111)
GFR calc non Af Amer: >60 mL/min (ref >60)
GLUCOSE: 223 mg/dL — AB (ref 65–99)
Potassium: 3.8 mmol/L (ref 3.5–5.1)
SODIUM: 138 mmol/L (ref 135–145)
Total Protein: 5.5 g/dL — ABNORMAL LOW (ref 6.5–8.1)

## 2017-01-08 LAB — CBC
HEMATOCRIT: 30.2 % — AB (ref 39.0–52.0)
Hemoglobin: 9.7 g/dL — ABNORMAL LOW (ref 13.0–17.0)
MCH: 28.6 pg (ref 26.0–34.0)
MCHC: 32.1 g/dL (ref 30.0–36.0)
MCV: 89.1 fL (ref 78.0–100.0)
Platelets: 308 10*3/uL (ref 150–400)
RBC: 3.39 MIL/uL — ABNORMAL LOW (ref 4.22–5.81)
RDW: 16.1 % — AB (ref 11.5–15.5)
WBC: 5.5 10*3/uL (ref 4.0–10.5)

## 2017-01-08 LAB — MAGNESIUM: Magnesium: 2.1 mg/dL (ref 1.7–2.4)

## 2017-01-08 LAB — PREALBUMIN: Prealbumin: 13.5 mg/dL — ABNORMAL LOW (ref 18–38)

## 2017-01-08 LAB — TRIGLYCERIDES: Triglycerides: 172 mg/dL — ABNORMAL HIGH (ref <150)

## 2017-01-08 LAB — PHOSPHORUS: Phosphorus: 2.5 mg/dL (ref 2.5–4.6)

## 2017-01-08 MED ORDER — INSULIN ASPART 100 UNIT/ML ~~LOC~~ SOLN
0.0000 [IU] | Freq: Three times a day (TID) | SUBCUTANEOUS | Status: DC
  Administered 2017-01-08 – 2017-01-09 (×3): 11 [IU] via SUBCUTANEOUS

## 2017-01-08 MED ORDER — INSULIN ASPART 100 UNIT/ML ~~LOC~~ SOLN
0.0000 [IU] | Freq: Every day | SUBCUTANEOUS | Status: DC
  Administered 2017-01-08: 4 [IU] via SUBCUTANEOUS

## 2017-01-08 MED ORDER — TRACE MINERALS CR-CU-MN-SE-ZN 10-1000-500-60 MCG/ML IV SOLN
INTRAVENOUS | Status: AC
  Administered 2017-01-08: 17:00:00 via INTRAVENOUS
  Filled 2017-01-08: qty 1992

## 2017-01-08 MED ORDER — FAT EMULSION 20 % IV EMUL
240.0000 mL | INTRAVENOUS | Status: AC
  Administered 2017-01-08: 240 mL via INTRAVENOUS
  Filled 2017-01-08: qty 250

## 2017-01-08 NOTE — Consult Note (Signed)
Referring Provider: Dr. Excell Seltzer Primary Care Physician:  No primary care provider on file. Primary Gastroenterologist:  Dr. Oletta Lamas  Reason for Consultation:  Crohn's Disease  HPI: [name] is a 61 y.o. male with long-standing Crohn's disease with terminal ileal resection at [facility] in the early 1980's who had lysis of adhesions and enteroenterostomy in 2001. Lysis of adhesions and creation of distal small bowel to transverse colon anastomosis in 2016. 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 Multiple organisms growing from anterior wall abscess. He denies being on any meds for Crohn's for years and recalls being on Prednisone prior to his 2016 surgery. He thinks he was on Asacol in the past for a short period of time. His last colonoscopy was in 2001 that showed stenosis of his cecal anastomosis preventing advancement of the colonoscope into the neoterminal ileum. No Crohn's colitis was seen on that colonoscopy but resection of that anastomosis in 2001 was consistent with Crohn's. Prior to admit he was having loose nonbloody stools 2-3 times per day for the past month. Rarely would have formed stools. Had been having constant abdominal pain prior to admit that has improved since his drainage last week.  Past Medical History:  Diagnosis Date  . Bladder stone   . 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 Crohn's disease ([place])   . History of aseptic necrosis of bone BILATERAL HIPS   S/P BONE GRAFT  . History of kidney stones   . History of mycosis fungoides   . S/P ileostomy ([place])     Past Surgical History:  Procedure Laterality Date  . BONE GRAFT OF LEFT HIP  1986   ASEPTIC NECROSIS  . COLONOSCOPY    . CYSTO/ BILATERAL RETROGRADE PYELOGRAM/ LEFT URETERAL STONE EXTRACTION / BILATERAL STENT PLACEMENT  10-07-2003  . CYSTOSCOPY WITH LITHOLAPAXY  02/12/2012   Procedure: CYSTOSCOPY WITH LITHOLAPAXY;  Surgeon: Claybon Jabs,  MD;  Location: [facility];  Service: Urology;  Laterality: N/A;  . EXPL. LAP. W/ ENTEROLYSIS, RESECTION INFLAMMATORY MASS RLQ / TAKE-DOWN OF FISTULA WITH ENTEROENTEROSTOMY/ RESECTION WITH THE SMALL BOWEL TO ASCENDING COLON ANASTOMOSIS  10-29-2000   CROHN'S DISEASE W/ ANASTOMOTIC INFLAMMATORY MASS/    10-30-2000 EXPL. LAP. CONTROL POST-OP ABD. BLEEDING  . EXPLORATORY LAPAROTOMY/ RESECTION OF ILEOCOLONIC ANASTOMOSIS AND CREATION OF ILEOSTOMY  11-04-2000   ABD. PERFORATION  . ILEOSTOMY CLOSURE N/A 07/23/2015   Procedure: Takedown ileostomy and repair of ostomy hernia, extensive entrolysis (2.5 hrs), ileostransverse colon anastomosis; closure of massive ventral hernia with 20 x 30 Strattice mesh;  Surgeon: Johnathan Hausen, MD;  Location: [facility];  Service: General;  Laterality: N/A;  . IRRIGATION AND DEBRIDEMENT ABSCESS Right 09/09/2014   Procedure: MINOR INCISION AND DRAINAGE OF ABSCESS;  Surgeon: Charlotte Crumb, MD;  Location: [place];  Service: Orthopedics;  Laterality: Right;  incision and drainage right long finger   . IRRIGATION AND DEBRIDEMENT ABSCESS N/A 01/03/2017   Procedure: IRRIGATION AND DEBRIDEMENT ABDOMINAL WALL ABSCESS;  Surgeon: Johnathan Hausen, MD;  Location: [facility];  Service: General;  Laterality: N/A;  . LAPAROSCOPY N/A 07/23/2015   Procedure: LAPAROSCOPY DIAGNOSTIC;  Surgeon: Johnathan Hausen, MD;  Location: [facility];  Service: General;  Laterality: N/A;  . ORIF ANKLE FRACTURE Left 08/27/2014   Procedure:  open reduction internal fixation left ankle;  Surgeon: Nita Sells, MD;  Location: [place];  Service: Orthopedics;  Laterality: Left;  Left  open reduction internal fixation ankle  . RESECTION OF TERMINAL ILEUM  1983  . RIGHT URETEROSCOPIC STONE EXTRACITON  04-24-2005  & 12-01-2002    Prior to Admission medications   Medication Sig Start Date End Date Taking? Authorizing Provider  potassium chloride (K-DUR,KLOR-CON) 10 MEQ tablet Take 40 mEq by  mouth 4 (four) times daily.   Yes Historical Provider, MD    Scheduled Meds: . heparin  5,000 Units Subcutaneous Q8H  . insulin aspart  0-20 Units Subcutaneous TID WC  . insulin aspart  0-5 Units Subcutaneous QHS  . pantoprazole (PROTONIX) IV  40 mg Intravenous QHS  . piperacillin-tazobactam (ZOSYN)  IV  3.375 g Intravenous Q8H   Continuous Infusions: . Marland KitchenTPN (CLINIMIX-E) Adult 83 mL/hr at 01/08/17 1000  . Marland KitchenTPN (CLINIMIX-E) Adult     And  . fat emulsion    . dextrose 5 % and 0.45 % NaCl with KCl 20 mEq/L 40 mL/hr at 01/08/17 1400   PRN Meds:.bisacodyl, morphine injection, ondansetron **OR** ondansetron (ZOFRAN) IV, sodium chloride flush  Allergies as of 01/03/2017  . (No Known Allergies)    Family History  Problem Relation Age of Onset  . Alzheimer's disease Father   . Cancer Father   . Cancer Sister     Social History   Social History  . Marital status: Married    Spouse name: N/A  . Number of children: N/A  . Years of education: N/A   Occupational History  . Not on file.   Social History Main Topics  . Smoking status: Former Smoker    Quit date: 08/26/1994  . Smokeless tobacco: Never Used  . Alcohol use No  . Drug use: No  . Sexual activity: No   Other Topics Concern  . Not on file   Social History Narrative  . No narrative on file    Review of Systems: All negative except as stated above in HPI.  Physical Exam: Vital signs: Vitals:   01/08/17 0546 01/08/17 1405  BP: 103/69 (!) 107/57  Pulse: 71 85  Resp: 18 18  Temp: 98.4 F (36.9 C) 98.6 F (37 C)   Last BM Date: 01/07/17 General:   Lethargic, Well-developed, well-nourished, pleasant and cooperative in NAD HEENT: anicteric sclera Neck: supple, nontender Lungs:  Clear throughout to auscultation.   No wheezes, crackles, or rhonchi. No acute distress. Heart:  Regular rate and rhythm; no murmurs, clicks, rubs,  or gallops. Abdomen: distended, diffusely tender with guarding, dressings intact  and not removed, +BS  Rectal:  Deferred Ext: no edema  GI:  Lab Results:  Recent Labs  01/08/17 0405  WBC 5.5  HGB 9.7*  HCT 30.2*  PLT 308   BMET  Recent Labs  01/06/17 0544 01/07/17 0454 01/08/17 0405  NA 141 139 138  K 3.0* 3.4* 3.8  CL 110 107 108  CO2 26 25 24   GLUCOSE 169* 188* 223*  BUN <5* 6 9  CREATININE 0.74 0.95 0.83  CALCIUM 7.5* 7.5* 7.7*   LFT  Recent Labs  01/08/17 0405  PROT 5.5*  ALBUMIN 2.2*  AST 11*  ALT 6*  ALKPHOS 46  BILITOT 0.2*   PT/INR No results for input(s): LABPROT, INR in the last 72 hours.   Studies/Results: No results found.  Impression/Plan: 61 yo with Crohn's disease and s/p abdominal wall abscess concerning for a colocutaneous fistula. When the abscess resolves he may need to be started on a biologic but will need an updated colonoscopy as an outpt after  resolution of the abscess prior to any initiation of a biologic. Would start Lialda 4.8 g/day when tolerating solid food. F/U with Dr. Oletta Lamas in 3-4 weeks. D/W Earnstine Regal from [place]. Will sign off. Call if questions.    LOS: 5 days   [place] C.  01/08/2017, 4:25 PM  Pager [phone]  If no answer or after 5 PM call [phone]

## 2017-01-08 NOTE — Progress Notes (Signed)
Nutrition Follow-up  DOCUMENTATION CODES:   Non-severe (moderate) malnutrition in context of acute illness/injury  INTERVENTION:   TPN per Pharmacy RD to continue to monitor  NUTRITION DIAGNOSIS:   Malnutrition related to acute illness as evidenced by severe depletion of body fat, severe depletion of muscle mass, percent weight loss.  Ongoing.  GOAL:   Patient will meet greater than or equal to 90% of their needs  Meeting with TPN.  MONITOR:   Diet advancement, Labs, Weight trends, Skin, I & O's, Other (Comment) (TPN)  ASSESSMENT:   61 y.o. male with longstanding history of Crohn's.  About 1.5 years ago he underwent closure of an ileostomy and placement of alloderm.  He did well until a few days ago when he had drainage from his wound. Now s/p I & D of anterior wall abscess 2/21.  TPN continues to run: Clinimix E 5/15 to 45ml/hr w/ 20% fat emulsion at 62ml/hr over 12 hours. Providing 1912 kcal and 100g protein. Pt now on clear liquids. PO intakes have ranged from 45-50% completion. Will continue to monitor for diet advancement.   Medications: IV Protonix daily,  D5 and .45% NaCl w/ KCl infusion at 40 ml/hr -provides 163 kcal Labs reviewed: CBGs: 297-231 Mg/Phos WNL TG: 172 mg/dL  Plan per Pharmacy 2/26: At 1800 today:  Continue Clinimix E 5/15 to 2ml/hr, the target rate  20% fat emulsion at 39ml/hr over 12 hours  Diet Order:  Diet clear liquid Room service appropriate? Yes; Fluid consistency: Thin .TPN (CLINIMIX-E) Adult .TPN (CLINIMIX-E) Adult  Skin:  Wound (see comment) (abdomen incision)  Last BM:  2/25  Height:   Ht Readings from Last 1 Encounters:  01/04/17 5\' 6"  (1.676 m)    Weight:   Wt Readings from Last 1 Encounters:  01/04/17 144 lb 11.2 oz (65.6 kg)    Ideal Body Weight:  64.5 kg  BMI:  Body mass index is 23.36 kg/m.  Estimated Nutritional Needs:   Kcal:  1800-2100kcal/day   Protein:  113-130g/day   Fluid:  >1.8L/day    EDUCATION NEEDS:   No education needs identified at this time  Clayton Bibles, MS, RD, LDN Pager: [phone] After Hours Pager: [phone]

## 2017-01-08 NOTE — Progress Notes (Signed)
5 Days Post-Op  Subjective: Open sites both look good.  Dressing recently changed, but minimal purulence now, base is a nice pink granular tissue. No bile or fecal staining present.    Objective: Vital signs in last 24 hours: Temp:  [97.3 F (36.3 C)-98.4 F (36.9 C)] 98.4 F (36.9 C) (02/26 0546) Pulse Rate:  [71-92] 71 (02/26 0546) Resp:  [16-18] 18 (02/26 0546) BP: (103-111)/(65-69) 103/69 (02/26 0546) SpO2:  [97 %-100 %] 100 % (02/26 0546) Last BM Date: 01/07/17 240 PO 3300 IV Urine 3602 BM x 1 Afebrile, VSS Glucose up some on TNA WBC is normal  CT scan was on 01/02/17 Intake/Output from previous day: 02/25 0701 - 02/26 0700 In: 3602.7 [P.O.:240; I.V.:2909.3; IV Piggyback:453.3] Out: 3652 [Urine:3651; Stool:1] Intake/Output this shift: Total I/O In: 360 [P.O.:360] Out: -   General appearance: alert, cooperative and no distress GI: soft, non-tender; bowel sounds normal; no masses,  no organomegaly and Open sites as noted above are pink and granular.  Minimal purulent fluid at the base/dressing. No bile staining or fecal contamination.  Lab Results:   Recent Labs  01/08/17 0405  WBC 5.5  HGB 9.7*  HCT 30.2*  PLT 308    BMET  Recent Labs  01/07/17 0454 01/08/17 0405  NA 139 138  K 3.4* 3.8  CL 107 108  CO2 25 24  GLUCOSE 188* 223*  BUN 6 9  CREATININE 0.95 0.83  CALCIUM 7.5* 7.7*   PT/INR No results for input(s): LABPROT, INR in the last 72 hours.   Recent Labs Lab 01/03/17 1337 01/04/17 0446 01/05/17 0440 01/08/17 0405  AST 11* 12* 12* 11*  ALT 10* 9* 9* 6*  ALKPHOS 68 53 51 46  BILITOT 0.9 0.4 0.5 0.2*  PROT 7.0 5.5* 5.6* 5.5*  ALBUMIN 2.6* 2.1* 2.0* 2.2*     Lipase     Component Value Date/Time   LIPASE 24 02/02/2009 0643     Studies/Results: No results found.  Medications: . heparin  5,000 Units Subcutaneous Q8H  . insulin aspart  0-15 Units Subcutaneous Q6H  . pantoprazole (PROTONIX) IV  40 mg Intravenous QHS  .  piperacillin-tazobactam (ZOSYN)  IV  3.375 g Intravenous Q8H   . Marland KitchenTPN (CLINIMIX-E) Adult 83 mL/hr at 01/07/17 1748  . dextrose 5 % and 0.45 % NaCl with KCl 20 mEq/L 40 mL/hr at 01/07/17 2225     Specimen Description ABDOMEN ABSCESS   Special Requests NONE   Gram Stain ABUNDANT WBC PRESENT,BOTH PMN AND MONONUCLEAR  MODERATE GRAM POSITIVE COCCI IN PAIRS IN CHAINS  FEW GRAM VARIABLE ROD  Performed at [facility], [place] [address], [place], [place] [address]      Culture  (A)  MULTIPLE ORGANISMS PRESENT, NONE PREDOMINANT  NO ANAEROBES ISOLATED; CULTURE IN PROGRESS FOR 5 DAYS        Assessment/Plan 1. 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 HX of Crohn's disease with terminal ileal resection done at [facility] in 1983   3. S/p Exploratory laparotomy with enterolysis, resection of inflammatory mass in right lower quadrant, and take-down of fistula with enteroenterostomy and resection with the small bowel to ascending colon anastomosis, 10/29/09, [name]/ Post op bleed with reexploration that PM 4. Attempted laparoscopy, laparotomy with extensive enterolysis (2.5 hours) and takedown of paraileostomy hernia, creation of distal small bowel to transverse colon anastomosis, repair  of ventral hernia with 20 x 30 cm Strattice mesh, 07/23/15, [name]  5. Hx of recurrent nephrolithiasis, urolithiasis, acute pyelonephritis 6. 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 7. Hypokalemia/Hypomagnesemia - replace in TPN FEN:IV fluids/clear fluids - TNA  [id number] 01/03/17 =>>day 6 [id number]     Plan: Continuing wet-to-dry dressing changes. He is on clear liquids and TNA. CT scan was on 01/02/17. I'll discuss repeat CT scan timing with [name]. Also  asked [name] to see in consult for follow-up for his Crohn's disease and treatment from this point on.  Will advance to full liquids today.  LOS: 5 days    [name] 01/08/2017 [phone]

## 2017-01-08 NOTE — Progress Notes (Signed)
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 male with longstanding history of Crohn's.  About 1.5 years ago he underwent closure of an ileostomy and placement of alloderm.  He did well until a few days ago when he had drainage from his wound.   S/p I&D abdominal wall abscess 01/03/17  Significant events:   Today, 01/08/17  Glucose - 207, 199, 164, 174, 231, goal <150  Electrolytes - WNL  Renal - WNL  LFTs - low but stable  TGs - 167 ( 2/23), 176 (2/26)  Prealbumin - 7.2 ( 2/23), 13.5 (2/26)  NUTRITIONAL GOALS                                                                                             RD recs: Protein:113-130 g/day   Kcal: 1800-2100 Kcal/day Clinimix E 5/15 at a goal rate of 67ml/hr + 20% fat emulsion at 57ml/hr over 12 hours to provide: 99.6 g/day protein, 1912 Kcal/day. +++ clinimix is on Psychologist, prison and probation services, pharmacy will use product based on availability++  PLAN                                                                                                                  At 1800 today:  Continue Clinimix E 5/15 to 67ml/hr, the target rate  20% fat emulsion at 76ml/hr over 12 hours  TPN to contain standard multivitamins and trace elements.  CBGs elevated but given fact that TPN should be started to tapered down as diet increases will not add insulin to TPN. Will change CBG/SSI to with meals and  increase to resistant  scale  Continue IVF to 40 ml/hr at 1800  TPN lab panels on Mondays & Thursdays.  F/u daily.   Adrian Saran, PharmD, BCPS Pager [phone] 01/08/2017 10:08 AM

## 2017-01-08 NOTE — Progress Notes (Signed)
Inpatient Diabetes Program Recommendations  AACE/ADA: New Consensus Statement on Inpatient Glycemic Control (2015)  Target Ranges:  Prepandial:   less than 140 mg/dL      Peak postprandial:   less than 180 mg/dL (1-2 hours)      Critically ill patients:  140 - 180 mg/dL   Results for ARJAY, HIBBERD (MRN [id number]) as of 01/08/2017 09:29  Ref. Range 01/07/2017 06:07 01/07/2017 11:40 01/07/2017 18:10 01/08/2017 00:18 01/08/2017 05:43  Glucose-Capillary Latest Ref Range: 65 - 99 mg/dL 207 (H) 199 (H) 164 (H) 174 (H) 231 (H)    Review of Glycemic Control  Diabetes history: None listed, A1c 8.3% in 2016 Current orders for Inpatient glycemic control: Novolog Moderate Q4 hours  Inpatient Diabetes Program Recommendations:   Patient on TPN and Novolog Moderate correction. Glucose trending in the 200's requiring higher Novolog dose. Please consider adjusting Novolog or consulting Pharmacy to add insulin in TPN.  Last A1c obtained in 2016 was elevated 8.3%. Please consider redrawing A1c level to assess glucose control over the past 2-3 months for possible new diagnosis.   Thanks,  Tama Headings RN, MSN, [facility] Inpatient Diabetes Coordinator Team Pager [phone] (8a-5p)

## 2017-01-08 NOTE — Progress Notes (Signed)
[place] pt for [facility] this hospital admission.  [facility] will provide [facility] for home IV ABX as ordered for home.  [facility] hospital team will follow pt while an inpatient to support transition home.   If patient discharges after hours, please call [phone].   Larry Sierras 01/08/2017, 5:19 PM

## 2017-01-09 LAB — GLUCOSE, CAPILLARY
GLUCOSE-CAPILLARY: 330 mg/dL — AB (ref 65–99)
GLUCOSE-CAPILLARY: 264 mg/dL — AB (ref 65–99)
GLUCOSE-CAPILLARY: 389 mg/dL — AB (ref 65–99)
GLUCOSE-CAPILLARY: 239 mg/dL — AB (ref 65–99)
Glucose-Capillary: 295 mg/dL — ABNORMAL HIGH (ref 65–99)
Glucose-Capillary: 387 mg/dL — ABNORMAL HIGH (ref 65–99)

## 2017-01-09 MED ORDER — ENSURE ENLIVE PO LIQD
237.0000 mL | Freq: Two times a day (BID) | ORAL | Status: DC
  Administered 2017-01-10 – 2017-01-12 (×5): 237 mL via ORAL

## 2017-01-09 MED ORDER — TRACE MINERALS CR-CU-MN-SE-ZN 10-1000-500-60 MCG/ML IV SOLN
INTRAVENOUS | Status: AC
  Administered 2017-01-09: 17:00:00 via INTRAVENOUS
  Filled 2017-01-09: qty 1992

## 2017-01-09 MED ORDER — EYE WASH OPHTH SOLN: 2.0000 [drp] | OPHTHALMIC | Status: DC | PRN

## 2017-01-09 MED ORDER — FAT EMULSION 20 % IV EMUL
240.0000 mL | INTRAVENOUS | Status: AC
  Administered 2017-01-09: 240 mL via INTRAVENOUS
  Filled 2017-01-09: qty 240

## 2017-01-09 MED ORDER — INSULIN ASPART 100 UNIT/ML ~~LOC~~ SOLN
6.0000 [IU] | Freq: Once | SUBCUTANEOUS | Status: AC
  Administered 2017-01-09: 6 [IU] via SUBCUTANEOUS

## 2017-01-09 MED ORDER — MESALAMINE 1.2 G PO TBEC
4.8000 g | DELAYED_RELEASE_TABLET | Freq: Every day | ORAL | Status: DC
  Administered 2017-01-10 – 2017-01-12 (×3): 4.8 g via ORAL
  Filled 2017-01-09 (×4): qty 4

## 2017-01-09 MED ORDER — INSULIN ASPART 100 UNIT/ML ~~LOC~~ SOLN
0.0000 [IU] | SUBCUTANEOUS | Status: DC
  Administered 2017-01-09: 20 [IU] via SUBCUTANEOUS
  Administered 2017-01-09: 11 [IU] via SUBCUTANEOUS
  Administered 2017-01-09 – 2017-01-10 (×2): 7 [IU] via SUBCUTANEOUS
  Administered 2017-01-10: 20 [IU] via SUBCUTANEOUS
  Administered 2017-01-10: 4 [IU] via SUBCUTANEOUS
  Administered 2017-01-10: 20 [IU] via SUBCUTANEOUS
  Administered 2017-01-10: 7 [IU] via SUBCUTANEOUS
  Administered 2017-01-10: 20 [IU] via SUBCUTANEOUS
  Administered 2017-01-11 (×2): 3 [IU] via SUBCUTANEOUS
  Administered 2017-01-11: 20 [IU] via SUBCUTANEOUS

## 2017-01-09 MED ORDER — MESALAMINE 1.2 G PO TBEC
1.2000 g | DELAYED_RELEASE_TABLET | Freq: Four times a day (QID) | ORAL | Status: DC
  Filled 2017-01-09 (×2): qty 1

## 2017-01-09 MED ORDER — POLYVINYL ALCOHOL 1.4 % OP SOLN
2.0000 [drp] | OPHTHALMIC | Status: DC | PRN
  Administered 2017-01-10 (×3): 2 [drp] via OPHTHALMIC
  Filled 2017-01-09: qty 15

## 2017-01-09 NOTE — Progress Notes (Signed)
Inpatient Diabetes Program Recommendations  AACE/ADA: New Consensus Statement on Inpatient Glycemic Control (2015)  Target Ranges:  Prepandial:   less than 140 mg/dL      Peak postprandial:   less than 180 mg/dL (1-2 hours)      Critically ill patients:  140 - 180 mg/dL   Lab Results  Component Value Date   GLUCAP 389 (H) 01/09/2017   HGBA1C 8.3 (H) 07/24/2015    Review of Glycemic Control  Diabetes history: None listed, A1c 8.3% in 2016 Current orders for Inpatient glycemic control: Novolog Resistant Q4 hours  Inpatient Diabetes Program Recommendations: Adjustments to insulin correction by Pharmacy in addition to insulin being dosed and added to TPN. Will follow patient trends.  Last A1c obtained in 2016 was elevated 8.3%. Please consider redrawing A1c level to assess glucose control over the past 2-3 months for possible new diagnosis.   Thanks,  Tama Headings RN, MSN, [facility] Inpatient Diabetes Coordinator Team Pager [phone] (8a-5p)

## 2017-01-09 NOTE — Progress Notes (Addendum)
Will Creig Hines, PA aware via phone pt c/o pain in lt eye and lt side of head. Eye blood shot with slight swelling around orbit. Pt reported discomfort earlier in shift, but "has gotten worse." Reassurance given. No new orders received. Will reported that he would notify on call surgeon, Dr. Alena Bills, to  come and assess. Warm, moist compress to lt eye provided for comfort.

## 2017-01-09 NOTE — Progress Notes (Signed)
[facility] Progress Note  6 Days Post-Op  Subjective: Pt had a small soft, formed BM. No blood in his stool. He is tolerating his diet. No nausea, vomiting, fevers overnight.   Objective: Vital signs in last 24 hours: Temp:  [98.3 F (36.8 C)-98.6 F (37 C)] 98.3 F (36.8 C) (02/27 0502) Pulse Rate:  [68-85] 68 (02/27 0502) Resp:  [16-18] 16 (02/27 0502) BP: (106-110)/(56-59) 106/59 (02/27 0502) SpO2:  [98 %-99 %] 98 % (02/27 0502) Last BM Date: 01/09/17  Intake/Output from previous day: 02/26 0701 - 02/27 0700 In: 2618.7 [P.O.:720; I.V.:1848.7; IV Piggyback:50] Out: [id number] [Urine:1550] Intake/Output this shift: Total I/O In: 360 [P.O.:360] Out: 700 [Urine:700]  PE: Gen:  Alert, NAD, pleasant, cooperative, well appearing Card:  RRR, no M/G/R heard Pulm:  CTA, no W/R/R, effort normal Abd: Soft, nondistended, +BS, wounds with good pink granulation tissue, minimal purulent drainage noted on packing, no foul odor or signs of fecal material. Mild TTP worse around wounds. Skin: no rashes noted, warm and dry  Lab Results:   Recent Labs  01/08/17 0405  WBC 5.5  HGB 9.7*  HCT 30.2*  PLT 308   BMET  Recent Labs  01/07/17 0454 01/08/17 0405  NA 139 138  K 3.4* 3.8  CL 107 108  CO2 25 24  GLUCOSE 188* 223*  BUN 6 9  CREATININE 0.95 0.83  CALCIUM 7.5* 7.7*   PT/INR No results for input(s): LABPROT, INR in the last 72 hours. CMP     Component Value Date/Time   NA 138 01/08/2017 0405   K 3.8 01/08/2017 0405   CL 108 01/08/2017 0405   CO2 24 01/08/2017 0405   GLUCOSE 223 (H) 01/08/2017 0405   BUN 9 01/08/2017 0405   CREATININE 0.83 01/08/2017 0405   CALCIUM 7.7 (L) 01/08/2017 0405   PROT 5.5 (L) 01/08/2017 0405   ALBUMIN 2.2 (L) 01/08/2017 0405   AST 11 (L) 01/08/2017 0405   ALT 6 (L) 01/08/2017 0405   ALKPHOS 46 01/08/2017 0405   BILITOT 0.2 (L) 01/08/2017 0405   GFRNONAA >60 01/08/2017 0405   GFRAA >60 01/08/2017 0405   Lipase      Component Value Date/Time   LIPASE 24 02/02/2009 0643       Studies/Results: No results found.  Anti-infectives: Anti-infectives    Start     Dose/Rate Route Frequency Ordered Stop   01/04/17 0000  piperacillin-tazobactam (ZOSYN) IVPB 3.375 g     3.375 g 12.5 mL/hr over 240 Minutes Intravenous Every 8 hours 01/03/17 1730     01/03/17 1530  piperacillin-tazobactam (ZOSYN) IVPB 3.375 g     3.375 g 100 mL/hr over 30 Minutes Intravenous  Once 01/03/17 1528 01/03/17 1545       Assessment/Plan 1. 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 Johnathan Hausen POD #6 Growing multiple organisms  2. HX of Crohn's disease with terminal ileal resection done at [facility] in 1983  3. S/p Exploratory laparotomy with enterolysis, resection of inflammatory mass in right lower quadrant, and take-down of fistula with enteroenterostomy and resection with the small bowel to ascending colon anastomosis, 10/29/09, DR. Martin/ Post op bleed with reexploration that PM 4. Attempted laparoscopy, laparotomy with extensive enterolysis (2.5 hours) and takedown of paraileostomy hernia, creation of distal small bowel to transverse colon anastomosis, repair of ventral hernia with 20 x 30 cm Strattice mesh, 07/23/15, DR. Martin  5. Hx of recurrent  nephrolithiasis, urolithiasis, acute pyelonephritis 6. 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 7. Hypokalemia/Hypomagnesemia - replace in TPN  FEN:IV fluids/full liquids - TNA  [id number] 01/03/17 =>>day 6 [id number]     Plan: Continuing wet-to-dry dressing changes. He is on full liquids and TNA. Repeat CT scan tomorrow.   LOS: 6 days    Kalman Drape , [facility] 01/09/2017, 11:25 AM Pager: [phone] Consults: [phone] Mon-Fri 7:00 am-4:30  pm Sat-Sun 7:00 am-11:30 am

## 2017-01-09 NOTE — Progress Notes (Signed)
Dressing change due. Pt prefers for the PA and/or the MD to round first. Pt reassured.

## 2017-01-09 NOTE — Progress Notes (Signed)
Patient ID: [name], male   DOB: [birth year], 61 y.o.   MRN: [id number]  Patient complaining of pain at his left eye and periorbital and left temporal headache. States this has been bothering him all day but he did not mention earlier. It is not severe. No scratchiness or pain with blinking. No change in his vision. Exam: May be some slight scleral injection. No exudate. Pupils equal and reactive. EOMs intact. Vision grossly normal. I do not see any periorbital swelling or erythema or tenderness.  He has some pain around his left eye and left sided headache. I'm not sure the etiology. I don't see any evidence of infection or injury to his eye. We will try comfort measures with saline drops and warm compress. Could ask ophthalmologist to check him tomorrow if this is not improving.   Edward Jolly MD, FACS  01/09/2017, 5:55 PM

## 2017-01-09 NOTE — Progress Notes (Signed)
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 male with longstanding history of Crohn's.  About 1.5 years ago he underwent closure of an ileostomy and placement of alloderm.  He did well until a few days ago when he had drainage from his wound.   S/p I&D abdominal wall abscess 01/03/17  Significant events:   Today, 01/09/17  Glucose - CBGs continue to rise - last 24hrs: 231, 297, 336, 330, 264  Electrolytes - WNL  Renal - WNL  LFTs - low but stable  TGs - 167 ( 2/23), 176 (2/26)  Prealbumin - 7.2 ( 2/23), 13.5 (2/26)  NUTRITIONAL GOALS                                                                                             RD recs: Protein:113-130 g/day   Kcal: 1800-2100 Kcal/day Clinimix E 5/15 at a goal rate of 51ml/hr + 20% fat emulsion at 48ml/hr over 12 hours to provide: 99.6 g/day protein, 1912 Kcal/day. +++ clinimix is on Psychologist, prison and probation services, pharmacy will use product based on availability++  PLAN                                                                                                                  At 1800 today:  Continue Clinimix E 5/15 to 71ml/hr, the target rate  20% fat emulsion at 42ml/hr over 12 hours  TPN to contain standard multivitamins and trace elements.  CBGs elevated significantly last 24hrs - will add insulin to TPN, change CBG/SSI back to q4h  Continue IVF to 40 ml/hr at 1800  TPN lab panels on  Mondays & Thursdays.  F/u daily.   Adrian Saran, PharmD, BCPS Pager [phone] 01/09/2017 10:05 AM

## 2017-01-09 NOTE — Progress Notes (Signed)
Nutrition Follow-up  DOCUMENTATION CODES:   Non-severe (moderate) malnutrition in context of acute illness/injury  INTERVENTION:   TPN per Pharmacy Provide Ensure Enlive po BID, each supplement provides 350 kcal and 20 grams of protein Encourage PO intake RD to continue to monitor  NUTRITION DIAGNOSIS:   Malnutrition related to acute illness as evidenced by severe depletion of body fat, severe depletion of muscle mass, percent weight loss.  Ongoing.  GOAL:   Patient will meet greater than or equal to 90% of their needs  Meeting.  MONITOR:   PO intake, Supplement acceptance, Labs, Weight trends, I & O's, Other (Comment) (TPN)  ASSESSMENT:   61 y.o. male with longstanding history of Crohn's.  About 1.5 years ago he underwent closure of an ileostomy and placement of alloderm.  He did well until a few days ago when he had drainage from his wound. Now s/p I & D of anterior wall abscess 2/21.  Patient in room with son at bedside. Pt states he feels hungry and just had his diet advanced to soft diet. Pt reports tolerating clear and full liquids with no issues. RD assisted patient in choosing menu items according to diet order. Pt to order fish, mashed potatoes and green beans. Will monitor PO intakes. Pt is willing to try protein supplements as well to supplement meals. RD to order Ensure supplements.  Medications: IV Protonix daily, D5 and .45% NaCl w/ KCl infusion at 40 ml/hr -provides 163 kcal Labs reviewed: CBGs: 264-389  Plan per Pharmacy 2/27: At 1800 today:  Continue Clinimix E 5/15 to 88ml/hr, the target rate  20% fat emulsion at 40ml/hr over 12 hours  Diet Order:  .TPN (CLINIMIX-E) Adult .TPN (CLINIMIX-E) Adult DIET SOFT Room service appropriate? Yes; Fluid consistency: Thin  Skin:  Wound (see comment) (abdomen incision)  Last BM:  2/27  Height:   Ht Readings from Last 1 Encounters:  01/04/17 5\' 6"  (1.676 m)    Weight:   Wt Readings from Last 1  Encounters:  01/04/17 144 lb 11.2 oz (65.6 kg)    Ideal Body Weight:  64.5 kg  BMI:  Body mass index is 23.36 kg/m.  Estimated Nutritional Needs:   Kcal:  1800-2100kcal/day   Protein:  113-130g/day   Fluid:  >1.8L/day   EDUCATION NEEDS:   No education needs identified at this time  Clayton Bibles, MS, RD, LDN Pager: [phone] After Hours Pager: [phone]

## 2017-01-10 ENCOUNTER — Encounter (HOSPITAL_COMMUNITY): Payer: Self-pay | Admitting: Radiology

## 2017-01-10 ENCOUNTER — Inpatient Hospital Stay (HOSPITAL_COMMUNITY): Payer: 59

## 2017-01-10 LAB — COMPREHENSIVE METABOLIC PANEL
ALBUMIN: 2.3 g/dL — AB (ref 3.5–5.0)
ALT: 8 U/L — ABNORMAL LOW (ref 17–63)
ANION GAP: 4 — AB (ref 5–15)
AST: 12 U/L — AB (ref 15–41)
Alkaline Phosphatase: 47 U/L (ref 38–126)
BUN: 11 mg/dL (ref 6–20)
CHLORIDE: 106 mmol/L (ref 101–111)
CO2: 27 mmol/L (ref 22–32)
Calcium: 8.3 mg/dL — ABNORMAL LOW (ref 8.9–10.3)
Creatinine, Ser: 0.89 mg/dL (ref 0.61–1.24)
GFR calc Af Amer: >60 mL/min (ref >60)
GFR calc non Af Amer: >60 mL/min (ref >60)
GLUCOSE: 244 mg/dL — AB (ref 65–99)
POTASSIUM: 3.7 mmol/L (ref 3.5–5.1)
SODIUM: 137 mmol/L (ref 135–145)
Total Bilirubin: <0.1 mg/dL — ABNORMAL LOW (ref 0.3–1.2)
Total Protein: 5.8 g/dL — ABNORMAL LOW (ref 6.5–8.1)

## 2017-01-10 LAB — AEROBIC/ANAEROBIC CULTURE W GRAM STAIN (SURGICAL/DEEP WOUND)

## 2017-01-10 LAB — PHOSPHORUS: Phosphorus: 1.7 mg/dL — ABNORMAL LOW (ref 2.5–4.6)

## 2017-01-10 LAB — GLUCOSE, CAPILLARY
GLUCOSE-CAPILLARY: 196 mg/dL — AB (ref 65–99)
GLUCOSE-CAPILLARY: 217 mg/dL — AB (ref 65–99)
GLUCOSE-CAPILLARY: 218 mg/dL — AB (ref 65–99)
GLUCOSE-CAPILLARY: 356 mg/dL — AB (ref 65–99)
GLUCOSE-CAPILLARY: 390 mg/dL — AB (ref 65–99)
GLUCOSE-CAPILLARY: 414 mg/dL — AB (ref 65–99)

## 2017-01-10 LAB — MAGNESIUM: Magnesium: 1.8 mg/dL (ref 1.7–2.4)

## 2017-01-10 MED ORDER — ARTIFICIAL TEARS OP OINT
TOPICAL_OINTMENT | Freq: Three times a day (TID) | OPHTHALMIC | Status: DC
  Administered 2017-01-10 – 2017-01-11 (×4): via OPHTHALMIC
  Administered 2017-01-11: 1 via OPHTHALMIC
  Administered 2017-01-12: 15:00:00 via OPHTHALMIC
  Administered 2017-01-12: 1 via OPHTHALMIC
  Filled 2017-01-10 (×2): qty 3.5

## 2017-01-10 MED ORDER — IOPAMIDOL (ISOVUE-300) INJECTION 61%
INTRAVENOUS | Status: AC
  Filled 2017-01-10: qty 100

## 2017-01-10 MED ORDER — KETOROLAC TROMETHAMINE 0.5 % OP SOLN
1.0000 [drp] | Freq: Three times a day (TID) | OPHTHALMIC | Status: DC | PRN
  Filled 2017-01-10: qty 3

## 2017-01-10 MED ORDER — IOPAMIDOL (ISOVUE-300) INJECTION 61%
INTRAVENOUS | Status: AC
  Administered 2017-01-10: 11:00:00
  Filled 2017-01-10: qty 30

## 2017-01-10 MED ORDER — INSULIN GLARGINE 100 UNIT/ML ~~LOC~~ SOLN
20.0000 [IU] | Freq: Every day | SUBCUTANEOUS | Status: DC
  Administered 2017-01-10: 20 [IU] via SUBCUTANEOUS
  Filled 2017-01-10: qty 0.2

## 2017-01-10 MED ORDER — TRACE MINERALS CR-CU-MN-SE-ZN 10-1000-500-60 MCG/ML IV SOLN
INTRAVENOUS | Status: DC
  Administered 2017-01-10: 18:00:00 via INTRAVENOUS
  Filled 2017-01-10: qty 1440

## 2017-01-10 MED ORDER — IOPAMIDOL (ISOVUE-300) INJECTION 61%
100.0000 mL | Freq: Once | INTRAVENOUS | Status: AC | PRN
  Administered 2017-01-10: 100 mL via INTRAVENOUS

## 2017-01-10 MED ORDER — IOPAMIDOL (ISOVUE-300) INJECTION 61%
15.0000 mL | Freq: Once | INTRAVENOUS | Status: AC | PRN
  Administered 2017-01-10: 15 mL via ORAL
  Filled 2017-01-10: qty 30

## 2017-01-10 MED ORDER — BSS IO SOLN
15.0000 mL | Freq: Once | INTRAOCULAR | Status: AC
  Administered 2017-01-10: 15 mL
  Filled 2017-01-10: qty 15

## 2017-01-10 MED ORDER — FAT EMULSION 20 % IV EMUL
240.0000 mL | INTRAVENOUS | Status: AC
  Administered 2017-01-10: 240 mL via INTRAVENOUS
  Filled 2017-01-10: qty 250

## 2017-01-10 MED ORDER — SODIUM CHLORIDE 0.9 % IV SOLN
INTRAVENOUS | Status: DC
  Administered 2017-01-10: 21:00:00 via INTRAVENOUS
  Administered 2017-01-11: 50 mL/h via INTRAVENOUS

## 2017-01-10 NOTE — Progress Notes (Signed)
Inpatient Diabetes Program Recommendations  AACE/ADA: New Consensus Statement on Inpatient Glycemic Control (2015)  Target Ranges:  Prepandial:   less than 140 mg/dL      Peak postprandial:   less than 180 mg/dL (1-2 hours)      Critically ill patients:  140 - 180 mg/dL   Results for [name], [name] (MRN [id number]) as of 01/10/2017 13:43  Ref. Range 01/10/2017 07:45 01/10/2017 12:06  Glucose-Capillary Latest Ref Range: 65 - 99 mg/dL 196 (H) 390 (H)   Review of Glycemic Control  Ordered: TPN with 20 of insulin, Novolog Resistant Q4 hours  Inpatient Diabetes Program Recommendations:   Consider Novolog 6 units TID with meals for meal coverage in addition to correction scale if patient continues to consume >50% of meals (patient consumed 90% for breakfast).  Thanks,  Tama Headings RN, MSN, [facility] Inpatient Diabetes Coordinator Team Pager [phone] (8a-5p)

## 2017-01-10 NOTE — Progress Notes (Signed)
7 Days Post-Op  Subjective: Patient still complaining of discomfort left eye. I do not see very much on exam. Some scleral injection, but not any more significant than the right eye. Abdominal sites both open, pink granular tissue. With white purulent drainage still present. Nothing that looks feculent. Skin around the sites without erythema or swelling.  Objective: Vital signs in last 24 hours: Temp:  [97.8 F (36.6 C)-98.6 F (37 C)] 97.8 F (36.6 C) (02/28 0455) Pulse Rate:  [78-87] 78 (02/28 0455) Resp:  [16] 16 (02/27 2250) BP: (95-125)/(43-59) 95/43 (02/28 0455) SpO2:  [98 %-100 %] 99 % (02/28 0455) Last BM Date: 01/09/17 1440 PO 2400 IV. 3200 urine BM x 1 Afebrile, VSS Glucose 244  Intake/Output from previous day: 02/27 0701 - 02/28 0700 In: 3863.4 [P.O.:1440; I.V.:2273.4; IV Piggyback:150] Out: 3200 [Urine:3200] Intake/Output this shift: Total I/O In: 540 [P.O.:540] Out: 550 [Urine:550]  General appearance: alert, cooperative and no distress Eyes: negative, some scleral injection but not much different in left or right eye.  ongoing discomfort left eye. Resp: clear to auscultation bilaterally GI: Both sites are open with some ongoing white purulent drainage. Nothing icteric tolerating diet well.  Lab Results:   Recent Labs  01/08/17 0405  WBC 5.5  HGB 9.7*  HCT 30.2*  PLT 308    BMET  Recent Labs  01/08/17 0405 01/10/17 0405  NA 138 137  K 3.8 3.7  CL 108 106  CO2 24 27  GLUCOSE 223* 244*  BUN 9 11  CREATININE 0.83 0.89  CALCIUM 7.7* 8.3*   PT/INR No results for input(s): LABPROT, INR in the last 72 hours.   Recent Labs Lab 01/03/17 1337 01/04/17 0446 01/05/17 0440 01/08/17 0405 01/10/17 0405  AST 11* 12* 12* 11* 12*  ALT 10* 9* 9* 6* 8*  ALKPHOS 68 53 51 46 47  BILITOT 0.9 0.4 0.5 0.2* <0.1*  PROT 7.0 5.5* 5.6* 5.5* 5.8*  ALBUMIN 2.6* 2.1* 2.0* 2.2* 2.3*     Lipase     Component Value Date/Time   LIPASE 24 02/02/2009 0643       Studies/Results: No results found.  Medications: . feeding supplement (ENSURE ENLIVE)  237 mL Oral BID BM  . heparin  5,000 Units Subcutaneous Q8H  . insulin aspart  0-20 Units Subcutaneous Q4H  . mesalamine  4.8 g Oral Q breakfast  . pantoprazole (PROTONIX) IV  40 mg Intravenous QHS  . piperacillin-tazobactam (ZOSYN)  IV  3.375 g Intravenous Q8H    Assessment/Plan 1. 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 Johnathan Hausen POD #6 Growing multiple organisms  2. HX of Crohn's disease with terminal ileal resection done at [facility] in 1983  3. S/p Exploratory laparotomy with enterolysis, resection of inflammatory mass in right lower quadrant, and take-down of fistula with enteroenterostomy and resection with the small bowel to ascending colon anastomosis, 10/29/09, DR. Martin/ Post op bleed with reexploration that PM 4. Attempted laparoscopy, laparotomy with extensive enterolysis (2.5 hours) and takedown of paraileostomy hernia, creation of distal small bowel to transverse colon anastomosis, repair of ventral hernia with 20 x 30 cm Strattice mesh, 07/23/15, DR. Martin  5. 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 7. Hypokalemia/Hypomagnesemia - replace in TPN  FEN:IV fluids/full liquids- TNA  [id number] 01/03/17 =>>day 8 Specimen Description ABDOMEN ABSCESS   Special Requests NONE  Gram Stain ABUNDANT WBC PRESENT,BOTH PMN AND MONONUCLEAR  MODERATE GRAM POSITIVE COCCI IN PAIRS IN CHAINS  FEW GRAM VARIABLE ROD      Culture  (A)  MULTIPLE ORGANISMS PRESENT, NONE PREDOMINANT  HOLDING FOR POSSIBLE ANAEROBE       [id number]    Plan: Discussed with Dr. Nori Riis ophthalmology. He recommends Lacri-Lube after  irrigation. He notes bedside exam is limited. If he is going home next 24-48 hours he can be seen in their office as soon as his discharge. Dr. Prudencio Burly will see him at that time and do a more complete exam. He did not recommend ophthalmic Toradol or antibiotics.  He has a CT scan pending today. If this is normal we anticipate weaning off TPN, and possible discharge later today. Continued wound care with home health.      LOS: 7 days    Macil Crady 01/10/2017 [phone]

## 2017-01-10 NOTE — Progress Notes (Addendum)
PHARMACY - ADULT TOTAL PARENTERAL NUTRITION CONSULT NOTE   Pharmacy Consult for TPN Indication: EC fistula  Patient Measurements: Height: 5\' 6"  (167.6 cm) Weight: 144 lb 11.2 oz (65.6 kg) IBW/kg (Calculated) : 63.8wt: 70.4 kg TPN AdjBW (KG): 70.4 Insulin Requirements: 42 units since new TPN started yesterday at 1800, insulin 20 units/L also in TPN bag  Current Nutrition: advanced to soft diet  IVF: D5 1/2 NS w/ 62meq KCl/L @ 40 mls/hr  Central access: PICC placed 2/22 TPN start date: 2/22  ASSESSMENT                                                                                                          HPI:  61 y.o. male with longstanding history of Crohn's.  About 1.5 years ago he underwent closure of an ileostomy and placement of alloderm.  He did well until a few days ago when he had drainage from his wound.   S/p I&D abdominal wall abscess 01/03/17  Significant events:   Today, 01/10/17  Glucose - CBGs appear to be trending down since insulin added to TPN last evening - 295 387, 218, and 196 since new TPN started at 1800  Electrolytes - WNL  Renal - WNL  LFTs - low but stable  TGs - 167 ( 2/23), 176 (2/26)  Prealbumin - 7.2 ( 2/23), 13.5 (2/26)  NUTRITIONAL GOALS                                                                                             RD recs: Protein:113-130 g/day   Kcal: 1800-2100 Kcal/day Clinimix E 5/15 at a goal rate of 65ml/hr + 20% fat emulsion at 29ml/hr over 12 hours to provide: 99.6 g/day protein, 1912 Kcal/day. +++ clinimix is on Psychologist, prison and probation services, pharmacy will use product based on availability++  PLAN                                                                                                                  At 1800 today:  Patient tolerating soft diet, will start decreasing Clinimix E 5/15 rate to help precipitate further appetite and possible wean of TPN in next day or so. Decrease rate from 65ml/hr  to 60 ml/hr  20% fat  emulsion at 73ml/hr over 12 hours  TPN to contain standard multivitamins and trace elements.  CBGs appear to be trending down - will decrease insulin some in TPN as CBGs should improve from decreasing rate itself  Continue IVF to 40 ml/hr at 1800  TPN lab panels on Mondays & Thursdays.  F/u daily.  Today is Day #8 of Zosyn. What is plan for length of therapy? Change to PO soon?   Adrian Saran, PharmD, BCPS Pager [phone] 01/10/2017 10:04 AM

## 2017-01-11 LAB — COMPREHENSIVE METABOLIC PANEL
ALBUMIN: 2.4 g/dL — AB (ref 3.5–5.0)
ALT: 30 U/L (ref 17–63)
AST: 15 U/L (ref 15–41)
Alkaline Phosphatase: 45 U/L (ref 38–126)
Anion gap: 5 (ref 5–15)
BUN: 9 mg/dL (ref 6–20)
CHLORIDE: 106 mmol/L (ref 101–111)
CO2: 27 mmol/L (ref 22–32)
CREATININE: 0.89 mg/dL (ref 0.61–1.24)
Calcium: 8.3 mg/dL — ABNORMAL LOW (ref 8.9–10.3)
GFR calc Af Amer: >60 mL/min (ref >60)
GFR calc non Af Amer: >60 mL/min (ref >60)
Glucose, Bld: 157 mg/dL — ABNORMAL HIGH (ref 65–99)
POTASSIUM: 3.4 mmol/L — AB (ref 3.5–5.1)
Sodium: 138 mmol/L (ref 135–145)
Total Bilirubin: 0.3 mg/dL (ref 0.3–1.2)
Total Protein: 6 g/dL — ABNORMAL LOW (ref 6.5–8.1)

## 2017-01-11 LAB — GLUCOSE, CAPILLARY
GLUCOSE-CAPILLARY: 139 mg/dL — AB (ref 65–99)
Glucose-Capillary: 136 mg/dL — ABNORMAL HIGH (ref 65–99)
Glucose-Capillary: 343 mg/dL — ABNORMAL HIGH (ref 65–99)

## 2017-01-11 LAB — PHOSPHORUS: Phosphorus: 2.3 mg/dL — ABNORMAL LOW (ref 2.5–4.6)

## 2017-01-11 LAB — MAGNESIUM: MAGNESIUM: 1.8 mg/dL (ref 1.7–2.4)

## 2017-01-11 MED ORDER — TAMSULOSIN HCL 0.4 MG PO CAPS
0.4000 mg | ORAL_CAPSULE | Freq: Every day | ORAL | Status: DC
  Filled 2017-01-11: qty 1

## 2017-01-11 NOTE — Progress Notes (Signed)
Discharge planning, spoke with patient at beside. Chose [facility] for [facility] services, contacted [facility] for referral, they are unable to take referral. Contacted [facility], [facility], and [facility] who also declined referral. Contacted [facility] and they accepted referral. Awaiting final orders to be placed. Patient updated. [phone]

## 2017-01-11 NOTE — Progress Notes (Signed)
PHARMACY - ADULT TOTAL PARENTERAL NUTRITION CONSULT NOTE   Pharmacy Consult for TPN Indication: EC fistula  Patient Measurements: Height: 5\' 6"  (167.6 cm) Weight: 144 lb 11.2 oz (65.6 kg) IBW/kg (Calculated) : 63.8wt: 70.4 kg TPN AdjBW (KG): 70.4   Insulin Requirements: 43 units of SSI since new TPN started yesterday at 1800 - on resistent SSI q4h + lantus 20 units qhs + 20 units regular insulin in TPN bag  Current Nutrition: advanced to soft diet - ensure started on 2/27 (first dose charted as given on 2/28)  IVF: NS @ 50 mls/hr  Central access: PICC placed 2/22 TPN start date: 2/22  ASSESSMENT                                                                                                          HPI:  61 y.o. male with longstanding history of Crohn's.  About 1.5 years ago he underwent closure of an ileostomy and placement of alloderm.  He did well until a few days ago when he had drainage from his wound.   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  +++ clinimix is on Psychologist, prison and probation services, pharmacy will use product based on availability++  PLAN                                                                                                                    -  To d/c TPN today--> titrate TPN rate by half (30 mL/hr) and run at this rate for 2 hrs, then turn off TPN - Spoke to [employer] re: insulin for patient -- Will indicated that pt will likely be d/ced home today and asked to d/c all insulin regimen for patient for now - pharmacy will sign off, re-consult [place] if need further assistance   Dia Sitter, PharmD, BCPS 01/11/2017 7:02 AM

## 2017-01-11 NOTE — Discharge Summary (Signed)
Physician Discharge Summary  Patient ID: [name] MRN: [id number] DOB/AGE: 08/04/1956 61 y.o.  Admit date: 01/03/2017 Discharge date: 01/12/2017  Admission Diagnoses: Crohn's with abscess; fistula--or operative drainage or possible diversion.    Discharge Diagnoses:  1. 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 Luretha Murphy  Growing multiple organisms  2. HX of Crohn's disease with terminal ileal resection done at [facility] in 1983 3. S/p Exploratory laparotomy with enterolysis, resection of inflammatory mass in right lower quadrant, and take-down of fistula with enteroenterostomy and resection with the small bowel to ascending colon anastomosis, 10/29/09, DR. Martin/ Post op bleed with reexploration that PM 4. Attempted laparoscopy, laparotomy with extensive enterolysis (2.5 hours) and takedown of paraileostomy hernia, creation of distal small bowel to transverse colon anastomosis, repair of ventral hernia with 20 x 30 cm Strattice mesh, 07/23/15, DR. Martin  5. Hx of recurrent nephrolithiasis, urolithiasis, acute pyelonephritis/Hydronephrosis with BPB  - Consult - Malen Gauze, MD 01/11/17 6. 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 7. Hypokalemia/Hypomagnesemia - replace in TPN 8.  Left eye discomfort - Antony Contras, MD, 01/10/17 Consult 9.  New diagnosis - Diabetes Mellitius  Principal Problem:   Abdominal wall abscess Active Problems:   Crohn's colitis, with abscess (HCC)   Hypokalemia   Hypomagnesemia   Malnutrition of moderate degree   Diabetes mellitus type 2 in nonobese [facility])   PROCEDURES: I&D abdominal wall abscess 01/03/17, Dr. Sandie Ano Course:   [name] is an 61 y.o. male with longstanding history of Crohn's.  About 1.5 years ago he underwent closure  of an ileostomy and placement of alloderm.  He did well until a few days ago when he had drainage from his wound.  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 Subcutaneous gas containing abscess in the lateral right ventral abdominal wall associated with the lateral portion of the colocutaneous fistula tract.  He was admitted and underwent incision and drainage as noted above. He was started on wet-to-dry dressings twice a day. Because we were worried about a colocutaneous fistula he was made NPO, a PICC line was placed, and he was started on TNA. The open sites one near the midline and the other in the old ileostomy site continued to put out some purulent fluid but it never turned feculent. Cultures grew out abundant multi organisms but nothing predominated. He was kept on IV Zosyn. He was seen by Dr. Bosie Clos for Dr. Ramon Dredge his GI doctor,  after the I&D procedure.  They recommended beginning treatment, and recommended Lialda 4.8 g/day, when he was started on a diet.  This was initiated here in the hospital.   The drainage from the two open wounds did not show any enteric content, and we never say anything by direct vision to indicate he had an EC fistula. Culture grew out multiple organisms, but no one organism.   Repeat CT, scan on 01/10/17  showed mild improvement in the coloenteric fistula with abscess;  compared to the one on 01/02/15 there was good evacuation of the abscess on the right was a persistent linear collection extending from the umbilicus to the subcutaneous right abdominal wall there is no direct communication with colon identified on this exam. Some high density material within  the abscess could be oral contrast from the current exam. There was some new hydronephrosis and hydroureter on the left.   After reviewing the CT was Dr.  Lear Ng opinion we should have urology see him for the hydroureter. He was seen by Dr.McKenzie,  who felt this was secondary to BPH. He recommended patient be started on Flomax, with follow-up in one month.   He developed some discomfort in his left eye, exam was unremarkable. Ophthalmology consult was obtained, a phone consult resulted in a recommendation that the patient be placed on Lacri-Lube, and follow-up in Dr. Marthe Patch office as soon as his discharge. We'll plan to have him go to Dr. Marthe Patch office upon leaving the hospital.   He was found to have elevated glucoses here in the hospital.  We attributed it to stress, and then TNA.  A Hemoglobin A1 C was checked and found to be 7.4 on 01/11/17.  Medicine consult was obtained and he was seen by Dr. Allyson Sabal on 01/12/17.  She recommended Glipizide 5 mg BID, glucometer for home monitoring and follow up with a PCP for glucose control.  Diabetes coordinator was contacted and she left the patient detailed instructions.   BMP was checked and electrolytes were addressed.  He was then ready for discharge.  We discharged  him home on;  wet-to-dry dressings twice a day. Continue him on Augmentin for 1 week, and follow-up with Dr. Hassell Done in the office. The office has arranged follow up.  He was working to obtain PCP for follow up of his diabetes prior to discharge.    Condition on D/C:  Improved    CBC Latest Ref Rng & Units 01/08/2017 01/05/2017 01/04/2017  WBC 4.0 - 10.5 K/uL 5.5 5.8 7.8  Hemoglobin 13.0 - 17.0 g/dL 9.7(L) 9.6(L) 9.5(L)  Hematocrit 39.0 - 52.0 % 30.2(L) 29.3(L) 28.9(L)  Platelets 150 - 400 K/uL 308 268 255   CMP Latest Ref Rng & Units 01/12/2017 01/11/2017 01/10/2017  Glucose 65 - 99 mg/dL 284(H) 157(H) 244(H)  BUN 6 - 20 mg/dL '9 9 11  '$ Creatinine 0.61 - 1.24 mg/dL 0.91 0.89 0.89  Sodium 135 - 145 mmol/L 136 138 137  Potassium 3.5 - 5.1 mmol/L 3.7 3.4(L) 3.7  Chloride 101 - 111 mmol/L 105 106 106  CO2 22 - 32 mmol/L '25 27 27  '$ Calcium 8.9 - 10.3  mg/dL 8.4(L) 8.3(L) 8.3(L)  Total Protein 6.5 - 8.1 g/dL - 6.0(L) 5.8(L)  Total Bilirubin 0.3 - 1.2 mg/dL - 0.3 <0.1(L)  Alkaline Phos 38 - 126 U/L - 45 47  AST 15 - 41 U/L - 15 12(L)  ALT 17 - 63 U/L - 30 8(L)   Specimen Description ABDOMEN ABSCESS   Special Requests NONE   Gram Stain ABUNDANT WBC PRESENT,BOTH PMN AND MONONUCLEAR  MODERATE GRAM POSITIVE COCCI IN PAIRS IN CHAINS  FEW GRAM VARIABLE ROD     Prealbumin   01/07/17 18 - 38 mg/dL 13.5     Hgb A1c MFr Bld  01/10/17 4.8 - 5.6 % 7.4    Comments: (NOTE)      Pre-diabetes: 5.7 - 6.4      Diabetes: >6.4       Disposition: 01-Home or Self Care   Allergies as of 01/12/2017   No Known Allergies     Medication List    TAKE these medications   amoxicillin-clavulanate 875-125 MG tablet Commonly known as:  AUGMENTIN Take 1 tablet by mouth 2 (two) times daily.   artificial tears Oint  ophthalmic ointment Place into the left eye every 8 (eight) hours.   blood glucose meter kit and supplies Dispense based on patient and insurance preference. Use up to four times daily as directed. (FOR ICD-9 250.00, 250.01).   glipiZIDE 5 MG tablet Commonly known as:  GLUCOTROL Take 1 tablet (5 mg total) by mouth 2 (two) times daily.   mesalamine 1.2 g EC tablet Commonly known as:  LIALDA Take 4 tablets (4.8 g total) by mouth daily with breakfast.   potassium chloride 10 MEQ tablet Commonly known as:  K-DUR,KLOR-CON Take 40 mEq by mouth 4 (four) times daily.   tamsulosin 0.4 MG Caps capsule Commonly known as:  FLOMAX Take 1 capsule (0.4 mg total) by mouth daily after supper.      Follow-up Information    Katy Apo, MD Follow up.   Specialty:  Ophthalmology Contact information: [place] [address] [phone]        Pedro Earls, MD Follow up on 01/11/2017.   Specialty:  General Surgery Why:  Our office will call you with appointment.  Working to get you in next week or the following  week.  Contact information: [address] [place] [address] [phone]        [facility] Follow up.   Specialty:  [place] Why:  nursing for wound care Contact information: [place] [address] [phone]        Pedro Earls, MD Follow up.   Specialty:  General Surgery Why:  Your appointment is on March 12th at 4:00 with Dr. Carter Kitten information: [address] [place] [address] [phone]           Signed: Earnstine Regal 01/15/2017, 1:53 PM

## 2017-01-11 NOTE — Progress Notes (Signed)
Notified Dr. Johney Maine of elevated CBG of 414, new orders noted and received.

## 2017-01-11 NOTE — Progress Notes (Signed)
8 Days Post-Op  Subjective: He is doing better, wound is about the same. No feculent drainage. His left eye feels better with Lacri-Lube. We've ask urology to see because of the changes on the CT scan including some new hydronephrosis and hydroureter on the left.  Objective: Vital signs in last 24 hours: Temp:  [98.2 F (36.8 C)-98.7 F (37.1 C)] 98.6 F (37 C) (03/01 0449) Pulse Rate:  [75-94] 83 (03/01 0449) Resp:  [17-18] 18 (03/01 0449) BP: (98-113)/(59-63) 106/63 (03/01 0449) SpO2:  [99 %-100 %] 100 % (03/01 0449) Last BM Date: 01/09/17 1160 PO 3000 IV Urine 1850 BM x 2 Afebrile, VSS BMP OK CT scan yesterday:  Mild Improvement in Coloenteric fistula with abscess compared to CT of 01/02/2015. 2. Evacuation of the subcutaneous abscess in the RIGHT abdominal wall. 3. Persistent linear gas collection extending from the umbilicus to the subcutaneous abscess RIGHT abdominal wall. No direct communication with colon identified on current exam. Some high-density material within abscess could be oral contrast from current exam or prior CT exam. 4. New hydronephrosis and hydroureter of the LEFT renal collecting system without obstructing lesion identified. 5. LEFT nephrolithiasis.   Intake/Output from previous day: 02/28 0701 - 03/01 0700 In: 4271.9 [P.O.:1160; I.V.:2861.9; IV Piggyback:250] Out: 1850 [Urine:1850] Intake/Output this shift: No intake/output data recorded.  General appearance: alert, cooperative and no distress Ears: normal TM's and external ear canals both ears and No change in the left eye exam. He continues to complain of some mild discomfort better with Lacri-Lube. Resp: clear to auscultation bilaterally GI: Open wounds are clean with minimal drainage. Both are granulating in nicely. The drainage that is still there is purulent, but not feculent.  Lab Results:  No results for input(s): WBC, HGB, HCT, PLT in the last 72 hours.  BMET  Recent Labs  01/10/17 0405 01/11/17 0415  NA 137 138  K 3.7 3.4*  CL 106 106  CO2 27 27  GLUCOSE 244* 157*  BUN 11 9  CREATININE 0.89 0.89  CALCIUM 8.3* 8.3*   PT/INR No results for input(s): LABPROT, INR in the last 72 hours.   Recent Labs Lab 01/05/17 0440 01/08/17 0405 01/10/17 0405 01/11/17 0415  AST 12* 11* 12* 15  ALT 9* 6* 8* 30  ALKPHOS 51 46 47 45  BILITOT 0.5 0.2* <0.1* 0.3  PROT 5.6* 5.5* 5.8* 6.0*  ALBUMIN 2.0* 2.2* 2.3* 2.4*     Lipase     Component Value Date/Time   LIPASE 24 02/02/2009 [id number]     Studies/Results: Ct Abdomen Pelvis W Contrast  Result Date: 01/10/2017 CLINICAL DATA:  Gross disease. Complex surgical history of multiple bowel anastomoses. Recent exam demonstrated colo enteric fistula. Re-evaluation. EXAM: CT ABDOMEN AND PELVIS WITH CONTRAST TECHNIQUE: Multidetector CT imaging of the abdomen and pelvis was performed using the standard protocol following bolus administration of intravenous contrast. CONTRAST:  122mL ISOVUE-300 IOPAMIDOL (ISOVUE-300) INJECTION 61% COMPARISON:  CT 01/02/2015 FINDINGS: Lower chest: Lung bases are clear. Hepatobiliary: None hepatic cysts again noted. No biliary duct dilatation. Pancreas: Pancreas is normal. No ductal dilatation. No pancreatic inflammation. Spleen: Normal spleen Adrenals/urinary tract: Adrenal glands are normal. Nonobstructing calculus in the LEFT kidney. There bilateral extrarenal pelves with hydroureter on the LEFT without obstructing lesion identified. Normal bladder. Stomach/Bowel: Stomach, duodenum small-bowel normal without evidence obstruction. There is some inflammation surrounding the ascending colon with bowel wall thickening. This is site of prior fistula. There is a extraluminal linear gas collection along the ventral peritoneal surface of  the RIGHT abdomen which is decreased slightly in size (image 40, series 2). There is some high-density material within this collection which may represent fistulous  connection with bowel. This collection extends from the skin defect in the RIGHT lateral abdominal wall to the umbilicus. The gas track from the ascending colon to the ventral peritoneal gas collections is not demonstrated currently. There is interval evacuation of the subcutaneous abscess in the RIGHT abdominal wall with an open wound remaining. More distal colon is normal.  Oral Contrast enters the rectum. Vascular/Lymphatic: Abdominal aorta is normal caliber. There is no retroperitoneal or periportal lymphadenopathy. No pelvic lymphadenopathy. Reproductive: Prostate normal Other: No free fluid. Musculoskeletal: No aggressive osseous lesion. IMPRESSION: 1. 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 2. Evacuation of the subcutaneous abscess in the RIGHT abdominal wall. 3. Persistent linear gas collection extending from the umbilicus to the subcutaneous abscess RIGHT abdominal wall. No direct communication with colon identified on current exam. Some high-density material within abscess could be oral contrast from current exam or prior CT exam. 4. New hydronephrosis and hydroureter of the LEFT renal collecting system without obstructing lesion identified. 5. LEFT nephrolithiasis. Electronically Signed   By: Suzy Bouchard M.D.   On: 01/10/2017 15:20    Medications: . artificial tears   Left Eye Q8H  . feeding supplement (ENSURE ENLIVE)  237 mL Oral BID BM  . heparin  5,000 Units Subcutaneous Q8H  . insulin aspart  0-20 Units Subcutaneous Q4H  . insulin glargine  20 Units Subcutaneous QHS  . mesalamine  4.8 g Oral Q breakfast  . pantoprazole (PROTONIX) IV  40 mg Intravenous QHS  . piperacillin-tazobactam (ZOSYN)  IV  3.375 g Intravenous Q8H    Assessment/Plan 1. 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 Johnathan Hausen POD #6 Growing multiple organisms  2. HX of  Crohn's disease with terminal ileal resection done at [facility] in 1983  3. S/p Exploratory laparotomy with enterolysis, resection of inflammatory mass in right lower quadrant, and take-down of fistula with enteroenterostomy and resection with the small bowel to ascending colon anastomosis, 10/29/09, DR. Martin/ Post op bleed with reexploration that PM 4. Attempted laparoscopy, laparotomy with extensive enterolysis (2.5 hours) and takedown of paraileostomy hernia, creation of distal small bowel to transverse colon anastomosis, repair of ventral hernia with 20 x 30 cm Strattice mesh, 07/23/15, DR. Martin  5. Hx of recurrent nephrolithiasis, urolithiasis, acute pyelonephritis -  New left hydronephrosis and hydroureter - will ask Urology to see. 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 7. Hypokalemia/Hypomagnesemia - replace in TPN  FEN:IV fluids/full liquids- TNA  [id number] 01/03/17 =>>day 8 Specimen Description ABDOMEN ABSCESS   Special Requests NONE   Gram Stain ABUNDANT WBC PRESENT,BOTH PMN AND MONONUCLEAR  MODERATE GRAM POSITIVE COCCI IN PAIRS IN CHAINS  FEW GRAM VARIABLE ROD     Culture  (A)  MULTIPLE ORGANISMS PRESENT, NONE PREDOMINANT  HOLDING FOR POSSIBLE ANAEROBE       [id number]    Plan: He was seen by urology and started on Flomax for BPH. He'll follow-up with [facility] in one month. We discontinued his TNA. I'll recheck his labs in the a.m. and if those are normal we plan discharge home tomorrow. Our offices working to fitting meant to Dr. Earlie Server scheduler next 1-2 weeks. Home health to be arranged to help with dressing changes.  He will follow-up with Dr. Tyrone Schimke tomorrow on discharge for evaluation of his left eye.  LOS: 8 days    Jakia Kennebrew 01/11/2017 [phone]

## 2017-01-11 NOTE — Progress Notes (Signed)
Patient ID: [name], male   DOB: Dec 10, 1955, 61 y.o.   MRN: [id number]  8 Days Post-Op   Subjective: No specific complaints. Eye pain is somewhat better. No unusual drainage from wounds. Tolerating diet.  Objective: Vital signs in last 24 hours: Temp:  [98.1 F (36.7 C)-98.7 F (37.1 C)] 98.2 F (36.8 C) (03/01 1422) Pulse Rate:  [83-94] 84 (03/01 1422) Resp:  [17-18] 18 (03/01 1422) BP: (98-116)/(57-64) 113/57 (03/01 1422) SpO2:  [99 %-100 %] 100 % (03/01 1422) Last BM Date: 01/11/17  Intake/Output from previous day: 02/28 0701 - 03/01 0700 In: 4271.9 [P.O.:1160; I.V.:2861.9; IV Piggyback:250] Out: 1850 [Urine:1850] Intake/Output this shift: Total I/O In: 1007.2 [P.O.:270; I.V.:687.2; IV Piggyback:50] Out: 600 [Urine:600]  General appearance: alert, cooperative and no distress GI: normal findings: soft, non-tender  Dressings dry without unusual drainage  Lab Results:  No results for input(s): WBC, HGB, HCT, PLT in the last 72 hours. BMET  Recent Labs  01/10/17 0405 01/11/17 0415  NA 137 138  K 3.7 3.4*  CL 106 106  CO2 27 27  GLUCOSE 244* 157*  BUN 11 9  CREATININE 0.89 0.89  CALCIUM 8.3* 8.3*     Studies/Results: Ct Abdomen Pelvis W Contrast  Result Date: 01/10/2017 CLINICAL DATA:  Gross disease. Complex surgical history of multiple bowel anastomoses. Recent exam demonstrated colo enteric fistula. Re-evaluation. EXAM: CT ABDOMEN AND PELVIS WITH CONTRAST TECHNIQUE: Multidetector CT imaging of the abdomen and pelvis was performed using the standard protocol following bolus administration of intravenous contrast. CONTRAST:  178mL ISOVUE-300 IOPAMIDOL (ISOVUE-300) INJECTION 61% COMPARISON:  CT 01/02/2015 FINDINGS: Lower chest: Lung bases are clear. Hepatobiliary: None hepatic cysts again noted. No biliary duct dilatation. Pancreas: Pancreas is normal. No ductal dilatation. No pancreatic inflammation. Spleen: Normal spleen Adrenals/urinary tract: Adrenal  glands are normal. Nonobstructing calculus in the LEFT kidney. There bilateral extrarenal pelves with hydroureter on the LEFT without obstructing lesion identified. Normal bladder. Stomach/Bowel: Stomach, duodenum small-bowel normal without evidence obstruction. There is some inflammation surrounding the ascending colon with bowel wall thickening. This is site of prior fistula. There is a extraluminal linear gas collection along the ventral peritoneal surface of the RIGHT abdomen which is decreased slightly in size (image 40, series 2). There is some high-density material within this collection which may represent fistulous connection with bowel. This collection extends from the skin defect in the RIGHT lateral abdominal wall to the umbilicus. The gas track from the ascending colon to the ventral peritoneal gas collections is not demonstrated currently. There is interval evacuation of the subcutaneous abscess in the RIGHT abdominal wall with an open wound remaining. More distal colon is normal.  Oral Contrast enters the rectum. Vascular/Lymphatic: Abdominal aorta is normal caliber. There is no retroperitoneal or periportal lymphadenopathy. No pelvic lymphadenopathy. Reproductive: Prostate normal Other: No free fluid. Musculoskeletal: No aggressive osseous lesion. IMPRESSION: 1. 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 2. Evacuation of the subcutaneous abscess in the RIGHT abdominal wall. 3. Persistent linear gas collection extending from the umbilicus to the subcutaneous abscess RIGHT abdominal wall. No direct communication with colon identified on current exam. Some high-density material within abscess could be oral contrast from current exam or prior CT exam. 4. New hydronephrosis and hydroureter of the LEFT renal collecting system without obstructing lesion identified. 5. LEFT nephrolithiasis. Electronically Signed   By: Suzy Bouchard M.D.   On: 01/10/2017 15:20     Anti-infectives: Anti-infectives    Start  Dose/Rate Route Frequency Ordered Stop   01/04/17 0000  piperacillin-tazobactam (ZOSYN) IVPB 3.375 g     3.375 g 12.5 mL/hr over 240 Minutes Intravenous Every 8 hours 01/03/17 1730     01/03/17 1530  piperacillin-tazobactam (ZOSYN) IVPB 3.375 g     3.375 g 100 mL/hr over 30 Minutes Intravenous  Once 01/03/17 1528 01/03/17 1545      Assessment/Plan: 1. 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 Johnathan Hausen POD #6 No drainage to suggest active fistula. Wounds closing in well. 2. HX of Crohn's disease with terminal ileal resection done at [facility] in 1983  3. S/p Exploratory laparotomy with enterolysis, resection of inflammatory mass in right lower quadrant, and take-down of fistula with enteroenterostomy and resection with the small bowel to ascending colon anastomosis, 10/29/09, DR. Martin/ Post op bleed with reexploration that PM 4. Attempted laparoscopy, laparotomy with extensive enterolysis (2.5 hours) and takedown of paraileostomy hernia, creation of distal small bowel to transverse colon anastomosis, repair of ventral hernia with 20 x 30 cm Strattice mesh, 07/23/15, DR. Martin  5. Hx of recurrent nephrolithiasis, urolithiasis, acute pyelonephritis Currently with hydronephrosis. 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  Anticipate discharge tomorrow    LOS: 8 days    Audrey Eller T 01/11/2017

## 2017-01-11 NOTE — Consult Note (Signed)
Urology Consult  Referring physician: Dr. Excell Seltzer Reason for referral: left hydronephrosis  Chief Complaint: Weak urinary stream  History of Present Illness: [name] is a 61yo with a hx of Crohns s/p ileostomy and recently debridement of abdominal wall abscess who developed new bilateral hydronephrosis worse on the left during this hospitalization. He underwent CT on 2/20 which showed mild left renal pelvis fullness with a mild residual urine volume. Followup CT on 2/28 showed moderate left hydronephrosis and mild right hydronephrosis with a full bladder. Per the patient he has noticed worsening urgency, frequency, nocturia and a feeling of incomplete emptying over the past month. No hematuria or dysuria. No hx of UTIs or prostate infections. He has never been on BPH therapy. CT prostate volume is 60g.     Past Medical History:  Diagnosis Date  . Bladder stone   . 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 Crohn's disease ([place])   . History of aseptic necrosis of bone BILATERAL HIPS   S/P BONE GRAFT  . History of kidney stones   . History of mycosis fungoides   . S/P ileostomy ([place])    Past Surgical History:  Procedure Laterality Date  . BONE GRAFT OF LEFT HIP  1986   ASEPTIC NECROSIS  . COLONOSCOPY    . CYSTO/ BILATERAL RETROGRADE PYELOGRAM/ LEFT URETERAL STONE EXTRACTION / BILATERAL STENT PLACEMENT  10-07-2003  . CYSTOSCOPY WITH LITHOLAPAXY  02/12/2012   Procedure: CYSTOSCOPY WITH LITHOLAPAXY;  Surgeon: Claybon Jabs, MD;  Location: [facility];  Service: Urology;  Laterality: N/A;  . EXPL. LAP. W/ ENTEROLYSIS, RESECTION INFLAMMATORY MASS RLQ / TAKE-DOWN OF FISTULA WITH ENTEROENTEROSTOMY/ RESECTION WITH THE SMALL BOWEL TO ASCENDING COLON ANASTOMOSIS  10-29-2000   CROHN'S DISEASE W/ ANASTOMOTIC INFLAMMATORY MASS/    10-30-2000 EXPL. LAP. CONTROL POST-OP ABD. BLEEDING  . EXPLORATORY LAPAROTOMY/ RESECTION OF ILEOCOLONIC ANASTOMOSIS AND CREATION OF ILEOSTOMY  11-04-2000   ABD.  PERFORATION  . ILEOSTOMY CLOSURE N/A 07/23/2015   Procedure: Takedown ileostomy and repair of ostomy hernia, extensive entrolysis (2.5 hrs), ileostransverse colon anastomosis; closure of massive ventral hernia with 20 x 30 Strattice mesh;  Surgeon: Johnathan Hausen, MD;  Location: [facility];  Service: General;  Laterality: N/A;  . IRRIGATION AND DEBRIDEMENT ABSCESS Right 09/09/2014   Procedure: MINOR INCISION AND DRAINAGE OF ABSCESS;  Surgeon: Charlotte Crumb, MD;  Location: [place];  Service: Orthopedics;  Laterality: Right;  incision and drainage right long finger   . IRRIGATION AND DEBRIDEMENT ABSCESS N/A 01/03/2017   Procedure: IRRIGATION AND DEBRIDEMENT ABDOMINAL WALL ABSCESS;  Surgeon: Johnathan Hausen, MD;  Location: [facility];  Service: General;  Laterality: N/A;  . LAPAROSCOPY N/A 07/23/2015   Procedure: LAPAROSCOPY DIAGNOSTIC;  Surgeon: Johnathan Hausen, MD;  Location: [facility];  Service: General;  Laterality: N/A;  . ORIF ANKLE FRACTURE Left 08/27/2014   Procedure:  open reduction internal fixation left ankle;  Surgeon: Nita Sells, MD;  Location: [place];  Service: Orthopedics;  Laterality: Left;  Left open reduction internal fixation ankle  . RESECTION OF TERMINAL ILEUM  1983  . RIGHT URETEROSCOPIC STONE EXTRACITON  04-24-2005  & 12-01-2002    Medications: I have reviewed the patient's current medications. Allergies: No Known Allergies  Family History  Problem Relation Age of Onset  . Alzheimer's disease Father   . Cancer Father   . Cancer Sister    Social History:  reports that he quit smoking about 22 years ago. He has never used smokeless  tobacco. He reports that he does not drink alcohol or use drugs.  ROS  Physical Exam:  Vital signs in last 24 hours: Temp:  [98.1 F (36.7 C)-98.7 F (37.1 C)] 98.1 F (36.7 C) (03/01 0858) Pulse Rate:  [83-94] 93 (03/01 1341) Resp:  [17-18] 18 (03/01 1341) BP: (98-116)/(59-64) 116/64 (03/01 1341) SpO2:  [99 %-100 %]  100 % (03/01 1341) Physical Exam  Constitutional: He is oriented to person, place, and time. He appears well-developed and well-nourished.  HENT:  Head: Normocephalic and atraumatic.  Eyes: EOM are normal. Pupils are equal, round, and reactive to light.  Neck: Normal range of motion. No thyromegaly present.  Cardiovascular: Normal rate and regular rhythm.   Respiratory: Effort normal. No respiratory distress.  GI: Soft. He exhibits distension. He exhibits no mass. There is no tenderness. There is no rebound and no guarding.  Genitourinary: Prostate normal and penis normal.  Musculoskeletal: Normal range of motion. He exhibits no edema.  Neurological: He is alert and oriented to person, place, and time.  Skin: Skin is warm and dry.  Psychiatric: He has a normal mood and affect. His behavior is normal. Judgment and thought content normal.    Laboratory Data:  Results for orders placed or performed during the hospital encounter of 01/03/17 (from the past 72 hour(s))  Glucose, capillary     Status: Abnormal   Collection Time: 01/08/17  5:53 PM  Result Value Ref Range   Glucose-Capillary 252 (H) 65 - 99 mg/dL   Comment 1 Notify RN   Glucose, capillary     Status: Abnormal   Collection Time: 01/08/17 10:39 PM  Result Value Ref Range   Glucose-Capillary 336 (H) 65 - 99 mg/dL  Glucose, capillary     Status: Abnormal   Collection Time: 01/09/17  1:47 AM  Result Value Ref Range   Glucose-Capillary 330 (H) 65 - 99 mg/dL  Glucose, capillary     Status: Abnormal   Collection Time: 01/09/17  7:39 AM  Result Value Ref Range   Glucose-Capillary 264 (H) 65 - 99 mg/dL  Glucose, capillary     Status: Abnormal   Collection Time: 01/09/17 11:42 AM  Result Value Ref Range   Glucose-Capillary 389 (H) 65 - 99 mg/dL  Glucose, capillary     Status: Abnormal   Collection Time: 01/09/17  4:05 PM  Result Value Ref Range   Glucose-Capillary 239 (H) 65 - 99 mg/dL  Glucose, capillary     Status: Abnormal    Collection Time: 01/09/17  7:55 PM  Result Value Ref Range   Glucose-Capillary 295 (H) 65 - 99 mg/dL  Glucose, capillary     Status: Abnormal   Collection Time: 01/10/17 12:00 AM  Result Value Ref Range   Glucose-Capillary 387 (H) 65 - 99 mg/dL  Glucose, capillary     Status: Abnormal   Collection Time: 01/10/17  4:01 AM  Result Value Ref Range   Glucose-Capillary 218 (H) 65 - 99 mg/dL  Comprehensive metabolic panel     Status: Abnormal   Collection Time: 01/10/17  4:05 AM  Result Value Ref Range   Sodium 137 135 - 145 mmol/L   Potassium 3.7 3.5 - 5.1 mmol/L   Chloride 106 101 - 111 mmol/L   CO2 27 22 - 32 mmol/L   Glucose, Bld 244 (H) 65 - 99 mg/dL   BUN 11 6 - 20 mg/dL   Creatinine, Ser 0.89 0.61 - 1.24 mg/dL   Calcium 8.3 (L) 8.9 - 10.3  mg/dL   Total Protein 5.8 (L) 6.5 - 8.1 g/dL   Albumin 2.3 (L) 3.5 - 5.0 g/dL   AST 12 (L) 15 - 41 U/L   ALT 8 (L) 17 - 63 U/L   Alkaline Phosphatase 47 38 - 126 U/L   Total Bilirubin <0.1 (L) 0.3 - 1.2 mg/dL   GFR calc non Af Amer >60 >60 mL/min   GFR calc Af Amer >60 >60 mL/min    Comment: (NOTE) The eGFR has been calculated using the CKD EPI equation. This calculation has not been validated in all clinical situations. eGFR's persistently <60 mL/min signify possible Chronic Kidney Disease.    Anion gap 4 (L) 5 - 15  Magnesium     Status: None   Collection Time: 01/10/17  4:05 AM  Result Value Ref Range   Magnesium 1.8 1.7 - 2.4 mg/dL  Phosphorus     Status: Abnormal   Collection Time: 01/10/17  4:05 AM  Result Value Ref Range   Phosphorus 1.7 (L) 2.5 - 4.6 mg/dL  Glucose, capillary     Status: Abnormal   Collection Time: 01/10/17  7:45 AM  Result Value Ref Range   Glucose-Capillary 196 (H) 65 - 99 mg/dL  Glucose, capillary     Status: Abnormal   Collection Time: 01/10/17 12:06 PM  Result Value Ref Range   Glucose-Capillary 390 (H) 65 - 99 mg/dL  Glucose, capillary     Status: Abnormal   Collection Time: 01/10/17  4:07 PM   Result Value Ref Range   Glucose-Capillary 217 (H) 65 - 99 mg/dL  Glucose, capillary     Status: Abnormal   Collection Time: 01/10/17  8:06 PM  Result Value Ref Range   Glucose-Capillary 414 (H) 65 - 99 mg/dL  Glucose, capillary     Status: Abnormal   Collection Time: 01/11/17 12:00 AM  Result Value Ref Range   Glucose-Capillary 356 (H) 65 - 99 mg/dL  Comprehensive metabolic panel     Status: Abnormal   Collection Time: 01/11/17  4:15 AM  Result Value Ref Range   Sodium 138 135 - 145 mmol/L   Potassium 3.4 (L) 3.5 - 5.1 mmol/L   Chloride 106 101 - 111 mmol/L   CO2 27 22 - 32 mmol/L   Glucose, Bld 157 (H) 65 - 99 mg/dL   BUN 9 6 - 20 mg/dL   Creatinine, Ser 0.89 0.61 - 1.24 mg/dL   Calcium 8.3 (L) 8.9 - 10.3 mg/dL   Total Protein 6.0 (L) 6.5 - 8.1 g/dL   Albumin 2.4 (L) 3.5 - 5.0 g/dL   AST 15 15 - 41 U/L   ALT 30 17 - 63 U/L   Alkaline Phosphatase 45 38 - 126 U/L   Total Bilirubin 0.3 0.3 - 1.2 mg/dL   GFR calc non Af Amer >60 >60 mL/min   GFR calc Af Amer >60 >60 mL/min    Comment: (NOTE) The eGFR has been calculated using the CKD EPI equation. This calculation has not been validated in all clinical situations. eGFR's persistently <60 mL/min signify possible Chronic Kidney Disease.    Anion gap 5 5 - 15  Magnesium     Status: None   Collection Time: 01/11/17  4:15 AM  Result Value Ref Range   Magnesium 1.8 1.7 - 2.4 mg/dL  Phosphorus     Status: Abnormal   Collection Time: 01/11/17  4:15 AM  Result Value Ref Range   Phosphorus 2.3 (L) 2.5 - 4.6 mg/dL  Glucose, capillary     Status: Abnormal   Collection Time: 01/11/17  4:43 AM  Result Value Ref Range   Glucose-Capillary 136 (H) 65 - 99 mg/dL  Glucose, capillary     Status: Abnormal   Collection Time: 01/11/17  7:25 AM  Result Value Ref Range   Glucose-Capillary 139 (H) 65 - 99 mg/dL  Glucose, capillary     Status: Abnormal   Collection Time: 01/11/17 11:38 AM  Result Value Ref Range   Glucose-Capillary 343  (H) 65 - 99 mg/dL   Recent Results (from the past 240 hour(s))  Aerobic/Anaerobic Culture (surgical/deep wound)     Status: Abnormal   Collection Time: 01/03/17  3:24 PM  Result Value Ref Range Status   Specimen Description ABDOMEN ABSCESS  Final   Special Requests NONE  Final   Gram Stain   Final    ABUNDANT WBC PRESENT,BOTH PMN AND MONONUCLEAR MODERATE GRAM POSITIVE COCCI IN PAIRS IN CHAINS FEW GRAM VARIABLE ROD Performed at [facility], [place] [address] [place], [place] [address]    Culture MULTIPLE ORGANISMS PRESENT, NONE PREDOMINANT (A)  Final   Report Status 01/10/2017 FINAL  Final   Creatinine:  Recent Labs  01/05/17 0440 01/06/17 0544 01/07/17 0454 01/08/17 0405 01/10/17 0405 01/11/17 0415  CREATININE 0.92 0.74 0.95 0.83 0.89 0.89   Baseline Creatinine: 0.8  Impression/Assessment:  60yo with BPH with incomplete emptying, bilateral hydronephrosis  Plan:  1. I discussed the natural hx of obstructive uropathy and the various treatment options including medical and surgical options. After discussing the treatment options the patient wishes to proceed with alpha-blocker therapy. Please start Flomax 0.'4mg'$  QHS  2. Bilateral hydronephrosis: likely related to obstructive uropathy. The patient should followup at [facility] in 1 month for renal US and Uroflow  Nicolette Bang 01/11/2017, 2:00 PM

## 2017-01-12 DIAGNOSIS — L02211 Cutaneous abscess of abdominal wall: Secondary | ICD-10-CM

## 2017-01-12 DIAGNOSIS — K50114 Crohn's disease of large intestine with abscess: Secondary | ICD-10-CM

## 2017-01-12 DIAGNOSIS — E119 Type 2 diabetes mellitus without complications: Secondary | ICD-10-CM

## 2017-01-12 LAB — HEMOGLOBIN A1C
Hgb A1c MFr Bld: 7.4 % — ABNORMAL HIGH (ref 4.8–5.6)
Mean Plasma Glucose: 166 mg/dL

## 2017-01-12 LAB — BASIC METABOLIC PANEL
Anion gap: 6 (ref 5–15)
BUN: 9 mg/dL (ref 6–20)
CHLORIDE: 105 mmol/L (ref 101–111)
CO2: 25 mmol/L (ref 22–32)
Calcium: 8.4 mg/dL — ABNORMAL LOW (ref 8.9–10.3)
Creatinine, Ser: 0.91 mg/dL (ref 0.61–1.24)
GFR calc non Af Amer: >60 mL/min (ref >60)
Glucose, Bld: 284 mg/dL — ABNORMAL HIGH (ref 65–99)
POTASSIUM: 3.7 mmol/L (ref 3.5–5.1)
Sodium: 136 mmol/L (ref 135–145)

## 2017-01-12 LAB — GLUCOSE, CAPILLARY: GLUCOSE-CAPILLARY: 271 mg/dL — AB (ref 65–99)

## 2017-01-12 LAB — MAGNESIUM: MAGNESIUM: 1.5 mg/dL — AB (ref 1.7–2.4)

## 2017-01-12 MED ORDER — BLOOD GLUCOSE METER KIT: PACK | 0 refills | Status: DC

## 2017-01-12 MED ORDER — TAMSULOSIN HCL 0.4 MG PO CAPS: 0.4000 mg | ORAL_CAPSULE | Freq: Every day | ORAL | 0 refills | Status: DC

## 2017-01-12 MED ORDER — AMOXICILLIN-POT CLAVULANATE 875-125 MG PO TABS: 1.0000 | ORAL_TABLET | Freq: Two times a day (BID) | ORAL | 1 refills | Status: DC

## 2017-01-12 MED ORDER — MAGNESIUM OXIDE 400 (241.3 MG) MG PO TABS
800.0000 mg | ORAL_TABLET | Freq: Once | ORAL | Status: AC
  Administered 2017-01-12: 800 mg via ORAL
  Filled 2017-01-12: qty 2

## 2017-01-12 MED ORDER — POTASSIUM CHLORIDE CRYS ER 20 MEQ PO TBCR
40.0000 meq | EXTENDED_RELEASE_TABLET | Freq: Once | ORAL | Status: AC
  Administered 2017-01-12: 40 meq via ORAL
  Filled 2017-01-12: qty 2

## 2017-01-12 MED ORDER — MAGNESIUM OXIDE 400 (241.3 MG) MG PO TABS: 400.0000 mg | ORAL_TABLET | Freq: Two times a day (BID) | ORAL | Status: DC

## 2017-01-12 MED ORDER — GLIPIZIDE 5 MG PO TABS: 5.0000 mg | ORAL_TABLET | Freq: Two times a day (BID) | ORAL | 1 refills | Status: DC

## 2017-01-12 MED ORDER — LIVING WELL WITH DIABETES BOOK
Freq: Once | Status: AC
  Administered 2017-01-12: 15:00:00
  Filled 2017-01-12: qty 1

## 2017-01-12 MED ORDER — MESALAMINE 1.2 G PO TBEC: 4.8000 g | DELAYED_RELEASE_TABLET | Freq: Every day | ORAL | 0 refills | Status: DC

## 2017-01-12 MED ORDER — INSULIN DETEMIR 100 UNIT/ML ~~LOC~~ SOLN
10.0000 [IU] | Freq: Every day | SUBCUTANEOUS | Status: DC
  Filled 2017-01-12: qty 0.1

## 2017-01-12 MED ORDER — INSULIN ASPART 100 UNIT/ML ~~LOC~~ SOLN
0.0000 [IU] | SUBCUTANEOUS | Status: DC
  Administered 2017-01-12: 5 [IU] via SUBCUTANEOUS

## 2017-01-12 MED ORDER — PANTOPRAZOLE SODIUM 40 MG PO TBEC: 40.0000 mg | DELAYED_RELEASE_TABLET | Freq: Every day | ORAL | Status: DC

## 2017-01-12 MED ORDER — ARTIFICIAL TEARS OP OINT: TOPICAL_OINTMENT | Freq: Three times a day (TID) | OPHTHALMIC | 0 refills | Status: DC

## 2017-01-12 MED ORDER — INSULIN DETEMIR 100 UNIT/ML ~~LOC~~ SOLN
20.0000 [IU] | Freq: Every day | SUBCUTANEOUS | Status: DC
  Administered 2017-01-12: 20 [IU] via SUBCUTANEOUS
  Filled 2017-01-12: qty 0.2

## 2017-01-12 NOTE — Progress Notes (Signed)
Inpatient Diabetes Program Recommendations  AACE/ADA: New Consensus Statement on Inpatient Glycemic Control (2015)  Target Ranges:  Prepandial:   less than 140 mg/dL      Peak postprandial:   less than 180 mg/dL (1-2 hours)      Critically ill patients:  140 - 180 mg/dL   Spoke with patient and wife over the phone about new diabetes diagnosis. Discussed A1C results (7.4% on 01/11/2017) and explained what an A1C is. Discussed basic pathophysiology of DM Type 2, basic home care, importance of checking CBGs and maintaining good CBG control to prevent long-term and short-term complications. Reviewed glucose and A1C goals (below 80 is getting to be too low, above 180 too high). Reviewed signs and symptoms of hyperglycemia and hypoglycemia along with treatment for both.  Asked patient to check his glucose 2 times per day (before breakfast and alternating second check) and to keep a log book of glucose readings and insulin taken. Explained how the doctor he follows up with can use the log book to continue to make insulin adjustments if needed. Patient and wife knows to establish care with a PCP for DM follow up. Patient and wife verbalized understanding of information discussed and he states that he has no further questions at this time related to diabetes.   RNs to provide ongoing basic DM education at bedside with this patient and engage patient to actively check blood glucose.   Glucose meter kit order # [id number]  Thanks, Tama Headings RN, MSN, [facility] Inpatient Diabetes Coordinator Team Pager [phone] (8a-5p)

## 2017-01-12 NOTE — Progress Notes (Signed)
Pt's magnesium was 1.5 today. Will replenish with 800mg  BID prior to discharge. This should be adequate to replenish it per Pharmacist, Estill Bamberg.  Jackson Latino, [facility] Pager [phone]

## 2017-01-12 NOTE — Progress Notes (Signed)
Nutrition Follow-up  DOCUMENTATION CODES:   Non-severe (moderate) malnutrition in context of acute illness/injury  INTERVENTION:   Ensure Enlive po BID, each supplement provides 350 kcal and 20 grams of protein  NUTRITION DIAGNOSIS:   Malnutrition related to acute illness as evidenced by severe depletion of body fat, severe depletion of muscle mass, percent weight loss.  GOAL:   Patient will meet greater than or equal to 90% of their needs  MONITOR:   PO intake, Supplement acceptance, Labs, Weight trends, Skin  ASSESSMENT:   61 y.o. male with longstanding history of Crohn's.  About 1.5 years ago he underwent closure of an ileostomy and placement of alloderm.  He did well until a few days ago when he had drainage from his wound. Now s/p I & D of anterior wall abscess 2/21.   Met with pt in room today. Pt reports feeling much better today after having rough night with head ache, nausea, and abdominal pain. Pt was discontinued off TPN 3/1. Pt currently eating 75-100% soft diet and drinking 2 Ensures per day. RD weighed pt in bed today. Pt with weight gain since admit. RD discussed with pt the type of diet he should follow going forward and mainly to avoid fiber, high fat, and sugar if having a Crohn's flare. Pt may also benefit from diabetes education prior to discharge as he is with newly diagnosed diabetes. RD discussed with pt the importance of adequate protein intake for wound healing. Pt seems eager to gain muscle back and RD believes pt will be compliant with diet. Pt with low K and P today. Continue to monitor and supplement as needed per MD discretion.    Medications reviewed and include: heparin, insulin, protonix, zosyn, KCl, morphine   Labs reviewed: K 3.4(L), Ca 8.3(L) adj. 9.58 wnl, P 2.3(L), Alb 2.4(L) Hgb 9.7(L), Hct 30.2(L) cbgs- 244, 157 x 48hrs AIC 7.4(H) 3/1  Diet Order:  DIET SOFT Room service appropriate? Yes; Fluid consistency: Thin  Skin:  Wound (see comment)  (abdominal incision )  Last BM:  3/1  Height:   Ht Readings from Last 1 Encounters:  01/04/17 '5\' 6"'$  (1.676 m)    Weight:   Wt Readings from Last 1 Encounters:  01/12/17 158 lb 11.7 oz (72 kg)    Ideal Body Weight:  64.5 kg  BMI:  Body mass index is 25.62 kg/m.  Estimated Nutritional Needs:   Kcal:  1800-2100kcal/day   Protein:  113-130g/day   Fluid:  >1.8L/day   EDUCATION NEEDS:   No education needs identified at this time  Koleen Distance, RD, LDN Pager #[phone]

## 2017-01-12 NOTE — Discharge Instructions (Signed)
WOUND CARE: - dressing to be changed twice daily - supplies: sterile saline, kerlix, scissors, ABD pads, tape  - remove dressing and all packing carefully, moistening with sterile saline as needed to avoid packing/internal dressing sticking to the wound. - clean edges of skin around the wound with water/gauze, making sure there is no tape debris or leakage left on skin that could cause skin irritation or breakdown. - dampen and clean kerlix with sterile saline and pack wound from wound base to skin level, making sure to take note of any possible areas of wound tracking, tunneling and packing appropriately. Wound can be packed loosely. Trim kerlix to size if a whole kerlix is not required. - cover wound with a dry ABD pad and secure with tape.  - write the date/time on the dry dressing/tape to better track when the last dressing change occurred. - apply any skin protectant/powder recommended by clinician to protect skin/skin folds. - change dressing as needed if leakage occurs, wound gets contaminated, or patient requests to shower. - patient may shower daily with wound open and following the shower the wound should be dried and a clean dressing placed.   Follow up within 1-2 with your primary care provider to have labs drawn to check your magnesium and potassium levels.

## 2017-01-12 NOTE — Progress Notes (Signed)
9 Days Post-Op  Subjective: No real change in the wound, eye still bothers him but feels better than before.  H A1C up.  I have called Medicine to see.  Objective: Vital signs in last 24 hours: Temp:  [98 F (36.7 C)-98.9 F (37.2 C)] 98 F (36.7 C) (03/02 0615) Pulse Rate:  [75-97] 75 (03/02 0615) Resp:  [18] 18 (03/02 0615) BP: (92-116)/(51-64) 92/51 (03/02 0615) SpO2:  [99 %-100 %] 99 % (03/02 0615) Last BM Date: 01/11/17  Intake/Output from previous day: 03/01 0701 - 03/02 0700 In: 2507.2 [P.O.:870; I.V.:1487.2; IV Piggyback:150] Out: [id number] [Urine:1750] Intake/Output this shift: Total I/O In: 360 [P.O.:360] Out: 0   General appearance: alert, cooperative and no distress Resp: clear to auscultation bilaterally GI: Open wounds look about the same, continues to have purulent white colored fluid draining from the open sites. No change in the left eye, and still has some discomfort but it's improved with Lacri-Lube.  Lab Results:  No results for input(s): WBC, HGB, HCT, PLT in the last 72 hours.  BMET  Recent Labs  01/10/17 0405 01/11/17 0415  NA 137 138  K 3.7 3.4*  CL 106 106  CO2 27 27  GLUCOSE 244* 157*  BUN 11 9  CREATININE 0.89 0.89  CALCIUM 8.3* 8.3*   PT/INR No results for input(s): LABPROT, INR in the last 72 hours.   Recent Labs Lab 01/08/17 0405 01/10/17 0405 01/11/17 0415  AST 11* 12* 15  ALT 6* 8* 30  ALKPHOS 46 47 45  BILITOT 0.2* <0.1* 0.3  PROT 5.5* 5.8* 6.0*  ALBUMIN 2.2* 2.3* 2.4*     Lipase     Component Value Date/Time   LIPASE 24 02/02/2009 [id number]     Studies/Results: Ct Abdomen Pelvis W Contrast  Result Date: 01/10/2017 CLINICAL DATA:  Gross disease. Complex surgical history of multiple bowel anastomoses. Recent exam demonstrated colo enteric fistula. Re-evaluation. EXAM: CT ABDOMEN AND PELVIS WITH CONTRAST TECHNIQUE: Multidetector CT imaging of the abdomen and pelvis was performed using the standard protocol following bolus  administration of intravenous contrast. CONTRAST:  165mL ISOVUE-300 IOPAMIDOL (ISOVUE-300) INJECTION 61% COMPARISON:  CT 01/02/2015 FINDINGS: Lower chest: Lung bases are clear. Hepatobiliary: None hepatic cysts again noted. No biliary duct dilatation. Pancreas: Pancreas is normal. No ductal dilatation. No pancreatic inflammation. Spleen: Normal spleen Adrenals/urinary tract: Adrenal glands are normal. Nonobstructing calculus in the LEFT kidney. There bilateral extrarenal pelves with hydroureter on the LEFT without obstructing lesion identified. Normal bladder. Stomach/Bowel: Stomach, duodenum small-bowel normal without evidence obstruction. There is some inflammation surrounding the ascending colon with bowel wall thickening. This is site of prior fistula. There is a extraluminal linear gas collection along the ventral peritoneal surface of the RIGHT abdomen which is decreased slightly in size (image 40, series 2). There is some high-density material within this collection which may represent fistulous connection with bowel. This collection extends from the skin defect in the RIGHT lateral abdominal wall to the umbilicus. The gas track from the ascending colon to the ventral peritoneal gas collections is not demonstrated currently. There is interval evacuation of the subcutaneous abscess in the RIGHT abdominal wall with an open wound remaining. More distal colon is normal.  Oral Contrast enters the rectum. Vascular/Lymphatic: Abdominal aorta is normal caliber. There is no retroperitoneal or periportal lymphadenopathy. No pelvic lymphadenopathy. Reproductive: Prostate normal Other: No free fluid. Musculoskeletal: No aggressive osseous lesion. IMPRESSION: 1. 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 2. Evacuation of  the subcutaneous abscess in the RIGHT abdominal wall. 3. Persistent linear gas collection extending from the umbilicus to the subcutaneous abscess RIGHT abdominal  wall. No direct communication with colon identified on current exam. Some high-density material within abscess could be oral contrast from current exam or prior CT exam. 4. New hydronephrosis and hydroureter of the LEFT renal collecting system without obstructing lesion identified. 5. LEFT nephrolithiasis. Electronically Signed   By: Suzy Bouchard M.D.   On: 01/10/2017 15:20    Medications: . artificial tears   Left Eye Q8H  . feeding supplement (ENSURE ENLIVE)  237 mL Oral BID BM  . heparin  5,000 Units Subcutaneous Q8H  . mesalamine  4.8 g Oral Q breakfast  . pantoprazole (PROTONIX) IV  40 mg Intravenous QHS  . piperacillin-tazobactam (ZOSYN)  IV  3.375 g Intravenous Q8H  . tamsulosin  0.4 mg Oral QPC supper    Assessment/Plan 1. 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 Johnathan Hausen POD #6 Growing multiple organisms  2. HX of Crohn's disease with terminal ileal resection done at [facility] in 1983  3. S/p Exploratory laparotomy with enterolysis, resection of inflammatory mass in right lower quadrant, and take-down of fistula with enteroenterostomy and resection with the small bowel to ascending colon anastomosis, 10/29/09, DR. Martin/ Post op bleed with reexploration that PM 4. Attempted laparoscopy, laparotomy with extensive enterolysis (2.5 hours) and takedown of paraileostomy hernia, creation of distal small bowel to transverse colon anastomosis, repair of ventral hernia with 20 x 30 cm Strattice mesh, 07/23/15, DR. Martin  5. Hx of recurrent nephrolithiasis, urolithiasis, acute pyelonephritis -  New left hydronephrosis and hydroureter - Urology has placed him on Flomax. 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 7. Hypokalemia/Hypomagnesemia - replace in TPN 8.  New issue A1C 7.4   FEN:IV  fluids/full liquids- TNA  [id number] 01/03/17 =>>day 6 Specimen Description ABDOMEN ABSCESS   Special Requests NONE   Gram Stain ABUNDANT WBC PRESENT,BOTH PMN AND MONONUCLEAR  MODERATE GRAM POSITIVE COCCI IN PAIRS IN CHAINS  FEW GRAM VARIABLE ROD     Culture (A)  MULTIPLE ORGANISMS PRESENT, NONE PREDOMINANT  HOLDING FOR POSSIBLE ANAEROBE       VX:[id number]  No primary care provider on file.   Plan: I've ask medicine to see and evaluate for elevated hemoglobin A1c of 7.4. Will discuss discharge plans after medicine evaluation.  LOS: 9 days    [name] 01/12/2017 [id number]

## 2017-01-12 NOTE — Consult Note (Addendum)
[facility] History and Physical  KENNA FOGELMAN [id number] DOB: February 14, 1956 DOA: 01/03/2017  Referring physician:   PCP: No primary care provider on file.   Chief Complaint:diabetes   HPI:  61yo with a hx of Crohns , admitted for ileostomy and  debridement of abdominal wall abscess   bilateral hydronephrosis worse on the left , being followed by surgery and urology.Marland Kitchen He was also noted to have elevated blood sugars without a previous history of diabetes. Noted to have CBG ranging from 217-414. Hemoglobin A1c was found to be 7.4   . Per the patient he has noticed worsening urgency, frequency, nocturia and a feeling of incomplete emptying over the past month. A few weeks ago patient also had blurry vision in the left eye for unclear reasons. He has never seen an ophthalmologist. Diabetes coordinator was consulted, who has been recommending   sliding scale insulin. Patient has also been on TPN up until yesterday. Patient also noted to have hypokalemia hypomagnesemia during this admission. [facility] consulted for management of diabetes.      Review of Systems: negative for the following  Constitutional: Denies fever, chills, diaphoresis, appetite change and fatigue.  HEENT: Denies photophobia, eye pain, redness, hearing loss, ear pain, congestion, sore throat, rhinorrhea, sneezing, mouth sores, trouble swallowing, neck pain, neck stiffness and tinnitus.  Respiratory: Denies SOB, DOE, cough, chest tightness, and wheezing.  Cardiovascular: Denies chest pain, palpitations and leg swelling.  Gastrointestinal: Denies nausea, vomiting, abdominal pain, diarrhea, constipation, blood in stool and abdominal distention.  Genitourinary: Positive for dysuria, urgency, frequency, hematuria, flank pain and difficulty urinating.  Musculoskeletal: Denies myalgias, back pain, joint swelling, arthralgias and gait problem.  Skin: Denies pallor, rash and wound.  Neurological: Denies dizziness, seizures, syncope,  weakness, light-headedness, numbness and headaches.  Hematological: Denies adenopathy. Easy bruising, personal or family bleeding history  Psychiatric/Behavioral: Denies suicidal ideation, mood changes, confusion, nervousness, sleep disturbance and agitation       Past Medical History:  Diagnosis Date  . Bladder stone   . 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 Crohn's disease ([place])   . History of aseptic necrosis of bone BILATERAL HIPS   S/P BONE GRAFT  . History of kidney stones   . History of mycosis fungoides   . S/P ileostomy ([place])      Past Surgical History:  Procedure Laterality Date  . BONE GRAFT OF LEFT HIP  1986   ASEPTIC NECROSIS  . COLONOSCOPY    . CYSTO/ BILATERAL RETROGRADE PYELOGRAM/ LEFT URETERAL STONE EXTRACTION / BILATERAL STENT PLACEMENT  10-07-2003  . CYSTOSCOPY WITH LITHOLAPAXY  02/12/2012   Procedure: CYSTOSCOPY WITH LITHOLAPAXY;  Surgeon: Claybon Jabs, MD;  Location: [facility];  Service: Urology;  Laterality: N/A;  . EXPL. LAP. W/ ENTEROLYSIS, RESECTION INFLAMMATORY MASS RLQ / TAKE-DOWN OF FISTULA WITH ENTEROENTEROSTOMY/ RESECTION WITH THE SMALL BOWEL TO ASCENDING COLON ANASTOMOSIS  10-29-2000   CROHN'S DISEASE W/ ANASTOMOTIC INFLAMMATORY MASS/    10-30-2000 EXPL. LAP. CONTROL POST-OP ABD. BLEEDING  . EXPLORATORY LAPAROTOMY/ RESECTION OF ILEOCOLONIC ANASTOMOSIS AND CREATION OF ILEOSTOMY  11-04-2000   ABD. PERFORATION  . ILEOSTOMY CLOSURE N/A 07/23/2015   Procedure: Takedown ileostomy and repair of ostomy hernia, extensive entrolysis (2.5 hrs), ileostransverse colon anastomosis; closure of massive ventral hernia with 20 x 30 Strattice mesh;  Surgeon: Johnathan Hausen, MD;  Location: [facility];  Service: General;  Laterality: N/A;  . IRRIGATION AND DEBRIDEMENT ABSCESS Right 09/09/2014   Procedure: MINOR INCISION AND DRAINAGE OF ABSCESS;  Surgeon: Charlotte Crumb, MD;  Location: [facility];  Service: Orthopedics;  Laterality: Right;   incision and drainage right long finger   . IRRIGATION AND DEBRIDEMENT ABSCESS N/A 01/03/2017   Procedure: IRRIGATION AND DEBRIDEMENT ABDOMINAL WALL ABSCESS;  Surgeon: Johnathan Hausen, MD;  Location: [facility];  Service: General;  Laterality: N/A;  . LAPAROSCOPY N/A 07/23/2015   Procedure: LAPAROSCOPY DIAGNOSTIC;  Surgeon: Johnathan Hausen, MD;  Location: [facility];  Service: General;  Laterality: N/A;  . ORIF ANKLE FRACTURE Left 08/27/2014   Procedure:  open reduction internal fixation left ankle;  Surgeon: Nita Sells, MD;  Location: [facility];  Service: Orthopedics;  Laterality: Left;  Left open reduction internal fixation ankle  . RESECTION OF TERMINAL ILEUM  1983  . RIGHT URETEROSCOPIC STONE EXTRACITON  04-24-2005  & 12-01-2002      Social History:  reports that he quit smoking about 22 years ago. He has never used smokeless tobacco. He reports that he does not drink alcohol or use drugs.    No Known Allergies  Family History  Problem Relation Age of Onset  . Alzheimer's disease Father   . Cancer Father   . Cancer Sister         Prior to Admission medications   Medication Sig Start Date End Date Taking? Authorizing Provider  potassium chloride (K-DUR,KLOR-CON) 10 MEQ tablet Take 40 mEq by mouth 4 (four) times daily.   Yes Historical Provider, MD     Physical Exam: Vitals:   01/11/17 1341 01/11/17 1422 01/11/17 2111 01/12/17 0615  BP: 116/64 (!) 113/57 (!) 97/54 (!) 92/51  Pulse: 93 84 97 75  Resp: 18 18 18 18   Temp: 98.3 F (36.8 C) 98.2 F (36.8 C) 98.9 F (37.2 C) 98 F (36.7 C)  TempSrc: Oral Oral Oral Oral  SpO2: 100% 100% 99% 99%  Weight:      Height:          Constitutional: NAD, calm, comfortable Vitals:   01/11/17 1341 01/11/17 1422 01/11/17 2111 01/12/17 0615  BP: 116/64 (!) 113/57 (!) 97/54 (!) 92/51  Pulse: 93 84 97 75  Resp: 18 18 18 18   Temp: 98.3 F (36.8 C) 98.2 F (36.8 C) 98.9 F (37.2 C) 98 F (36.7 C)  TempSrc: Oral Oral Oral Oral   SpO2: 100% 100% 99% 99%  Weight:      Height:       Eyes: PERRL, lids and conjunctivae normal ENMT: Mucous membranes are moist. Posterior pharynx clear of any exudate or lesions.Normal dentition.  Neck: normal, supple, no masses, no thyromegaly Respiratory: clear to auscultation bilaterally, no wheezing, no crackles. Normal respiratory effort. No accessory muscle use.  Cardiovascular: Regular rate and rhythm, no murmurs / rubs / gallops. No extremity edema. 2+ pedal pulses. No carotid bruits.  Abdomen: no tenderness, no masses palpated. No hepatosplenomegaly. Bowel sounds positive.  Musculoskeletal: no clubbing / cyanosis. No joint deformity upper and lower extremities. Good ROM, no contractures. Normal muscle tone.  Skin: no rashes, lesions, ulcers. No induration Neurologic: CN 2-12 grossly intact. Sensation intact, DTR normal. Strength 5/5 in all 4.  Psychiatric: Normal judgment and insight. Alert and oriented x 3. Normal mood.     Labs on Admission: I have personally reviewed following labs and imaging studies  CBC:  Recent Labs Lab 01/08/17 0405  WBC 5.5  NEUTROABS 3.5  HGB 9.7*  HCT 30.2*  MCV 89.1  PLT [id number]    Basic Metabolic Panel:  Recent Labs Lab  01/06/17 0544 01/07/17 0454 01/08/17 0405 01/10/17 0405 01/11/17 0415  NA 141 139 138 137 138  K 3.0* 3.4* 3.8 3.7 3.4*  CL 110 107 108 106 106  CO2 26 25 24 27 27   GLUCOSE 169* 188* 223* 244* 157*  BUN <5* 6 9 11 9   CREATININE 0.74 0.95 0.83 0.89 0.89  CALCIUM 7.5* 7.5* 7.7* 8.3* 8.3*  MG 1.9 1.9 2.1 1.8 1.8  PHOS 2.5 2.3* 2.5 1.7* 2.3*    GFR: Estimated Creatinine Clearance: 78.7 mL/min (by C-G formula based on SCr of 0.89 mg/dL).  Liver Function Tests:  Recent Labs Lab 01/08/17 0405 01/10/17 0405 01/11/17 0415  AST 11* 12* 15  ALT 6* 8* 30  ALKPHOS 46 47 45  BILITOT 0.2* <0.1* 0.3  PROT 5.5* 5.8* 6.0*  ALBUMIN 2.2* 2.3* 2.4*   No results for input(s): LIPASE, AMYLASE in the last 168 hours. No  results for input(s): AMMONIA in the last 168 hours.  Coagulation Profile: No results for input(s): INR, PROTIME in the last 168 hours. No results for input(s): DDIMER in the last 72 hours.  Cardiac Enzymes: No results for input(s): CKTOTAL, CKMB, CKMBINDEX, TROPONINI in the last 168 hours.  BNP (last 3 results) No results for input(s): PROBNP in the last 8760 hours.  HbA1C:  Recent Labs  01/11/17 0415  HGBA1C 7.4*   Lab Results  Component Value Date   HGBA1C 7.4 (H) 01/11/2017   HGBA1C 8.3 (H) 07/24/2015   HGBA1C 8.5 (H) 07/23/2015     CBG:  Recent Labs Lab 01/10/17 2006 01/11/17 0000 01/11/17 0443 01/11/17 0725 01/11/17 1138  GLUCAP 414* 356* 136* 139* 343*    Lipid Profile: No results for input(s): CHOL, HDL, LDLCALC, TRIG, CHOLHDL, LDLDIRECT in the last 72 hours.  Thyroid Function Tests: No results for input(s): TSH, T4TOTAL, FREET4, T3FREE, THYROIDAB in the last 72 hours.  Anemia Panel: No results for input(s): VITAMINB12, FOLATE, FERRITIN, TIBC, IRON, RETICCTPCT in the last 72 hours.  Urine analysis:    Component Value Date/Time   COLORURINE YELLOW 02/16/2009 1931   APPEARANCEUR TURBID (A) 02/16/2009 1931   LABSPEC 1.025 02/16/2009 1931   PHURINE 5.5 02/16/2009 1931   GLUCOSEU NEGATIVE 02/16/2009 1931   HGBUR MODERATE (A) 02/16/2009 1931   BILIRUBINUR NEGATIVE 02/16/2009 1931   KETONESUR TRACE (A) 02/16/2009 1931   PROTEINUR NEGATIVE 02/16/2009 1931   UROBILINOGEN 0.2 02/16/2009 1931   NITRITE NEGATIVE 02/16/2009 1931   LEUKOCYTESUR SMALL (A) 02/16/2009 1931    Sepsis Labs: @LABRCNTIP (procalcitonin:4,lacticidven:4) ) Recent Results (from the past 240 hour(s))  Aerobic/Anaerobic Culture (surgical/deep wound)     Status: Abnormal   Collection Time: 01/03/17  3:24 PM  Result Value Ref Range Status   Specimen Description ABDOMEN ABSCESS  Final   Special Requests NONE  Final   Gram Stain   Final    ABUNDANT WBC PRESENT,BOTH PMN AND  MONONUCLEAR MODERATE GRAM POSITIVE COCCI IN PAIRS IN CHAINS FEW GRAM VARIABLE ROD Performed at [facility], [place] [address], [place], [place] [address]    Culture MULTIPLE ORGANISMS PRESENT, NONE PREDOMINANT (A)  Final   Report Status 01/10/2017 FINAL  Final         Radiological Exams on Admission: Ct Abdomen Pelvis W Contrast  Result Date: 01/10/2017 CLINICAL DATA:  Gross disease. Complex surgical history of multiple bowel anastomoses. Recent exam demonstrated colo enteric fistula. Re-evaluation. EXAM: CT ABDOMEN AND PELVIS WITH CONTRAST TECHNIQUE: Multidetector CT imaging of the abdomen and pelvis was performed using  the standard protocol following bolus administration of intravenous contrast. CONTRAST:  166mL ISOVUE-300 IOPAMIDOL (ISOVUE-300) INJECTION 61% COMPARISON:  CT 01/02/2015 FINDINGS: Lower chest: Lung bases are clear. Hepatobiliary: None hepatic cysts again noted. No biliary duct dilatation. Pancreas: Pancreas is normal. No ductal dilatation. No pancreatic inflammation. Spleen: Normal spleen Adrenals/urinary tract: Adrenal glands are normal. Nonobstructing calculus in the LEFT kidney. There bilateral extrarenal pelves with hydroureter on the LEFT without obstructing lesion identified. Normal bladder. Stomach/Bowel: Stomach, duodenum small-bowel normal without evidence obstruction. There is some inflammation surrounding the ascending colon with bowel wall thickening. This is site of prior fistula. There is a extraluminal linear gas collection along the ventral peritoneal surface of the RIGHT abdomen which is decreased slightly in size (image 40, series 2). There is some high-density material within this collection which may represent fistulous connection with bowel. This collection extends from the skin defect in the RIGHT lateral abdominal wall to the umbilicus. The gas track from the ascending colon to the ventral peritoneal gas collections is not demonstrated currently. There is  interval evacuation of the subcutaneous abscess in the RIGHT abdominal wall with an open wound remaining. More distal colon is normal.  Oral Contrast enters the rectum. Vascular/Lymphatic: Abdominal aorta is normal caliber. There is no retroperitoneal or periportal lymphadenopathy. No pelvic lymphadenopathy. Reproductive: Prostate normal Other: No free fluid. Musculoskeletal: No aggressive osseous lesion. IMPRESSION: 1. 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 2. Evacuation of the subcutaneous abscess in the RIGHT abdominal wall. 3. Persistent linear gas collection extending from the umbilicus to the subcutaneous abscess RIGHT abdominal wall. No direct communication with colon identified on current exam. Some high-density material within abscess could be oral contrast from current exam or prior CT exam. 4. New hydronephrosis and hydroureter of the LEFT renal collecting system without obstructing lesion identified. 5. LEFT nephrolithiasis. Electronically Signed   By: Suzy Bouchard M.D.   On: 01/10/2017 15:20   Ct Abdomen Pelvis W Contrast  Result Date: 01/10/2017 CLINICAL DATA:  Gross disease. Complex surgical history of multiple bowel anastomoses. Recent exam demonstrated colo enteric fistula. Re-evaluation. EXAM: CT ABDOMEN AND PELVIS WITH CONTRAST TECHNIQUE: Multidetector CT imaging of the abdomen and pelvis was performed using the standard protocol following bolus administration of intravenous contrast. CONTRAST:  175mL ISOVUE-300 IOPAMIDOL (ISOVUE-300) INJECTION 61% COMPARISON:  CT 01/02/2015 FINDINGS: Lower chest: Lung bases are clear. Hepatobiliary: None hepatic cysts again noted. No biliary duct dilatation. Pancreas: Pancreas is normal. No ductal dilatation. No pancreatic inflammation. Spleen: Normal spleen Adrenals/urinary tract: Adrenal glands are normal. Nonobstructing calculus in the LEFT kidney. There bilateral extrarenal pelves with hydroureter on the  LEFT without obstructing lesion identified. Normal bladder. Stomach/Bowel: Stomach, duodenum small-bowel normal without evidence obstruction. There is some inflammation surrounding the ascending colon with bowel wall thickening. This is site of prior fistula. There is a extraluminal linear gas collection along the ventral peritoneal surface of the RIGHT abdomen which is decreased slightly in size (image 40, series 2). There is some high-density material within this collection which may represent fistulous connection with bowel. This collection extends from the skin defect in the RIGHT lateral abdominal wall to the umbilicus. The gas track from the ascending colon to the ventral peritoneal gas collections is not demonstrated currently. There is interval evacuation of the subcutaneous abscess in the RIGHT abdominal wall with an open wound remaining. More distal colon is normal.  Oral Contrast enters the rectum. Vascular/Lymphatic: Abdominal aorta is normal caliber. There is no  retroperitoneal or periportal lymphadenopathy. No pelvic lymphadenopathy. Reproductive: Prostate normal Other: No free fluid. Musculoskeletal: No aggressive osseous lesion. IMPRESSION: 1. 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 2. Evacuation of the subcutaneous abscess in the RIGHT abdominal wall. 3. Persistent linear gas collection extending from the umbilicus to the subcutaneous abscess RIGHT abdominal wall. No direct communication with colon identified on current exam. Some high-density material within abscess could be oral contrast from current exam or prior CT exam. 4. New hydronephrosis and hydroureter of the LEFT renal collecting system without obstructing lesion identified. 5. LEFT nephrolithiasis. Electronically Signed   By: Suzy Bouchard M.D.   On: 01/10/2017 15:20   Ct Abdomen Pelvis W Contrast  Result Date: 01/02/2017 CLINICAL DATA:  61 year old male with a reported history of acute  mid abdominal pain. History of Crohn disease with complex bowel surgical history, with remote ileocecal resection, subsequent resection of ileocolonic anastomosis with ileostomy creation in 2001, and subsequent ileostomy takedown, repair of parastomal hernia and ventral hernia and ileotransverse colonic anastomosis on 07/23/2015. EXAM: CT ABDOMEN AND PELVIS WITH CONTRAST TECHNIQUE: Multidetector CT imaging of the abdomen and pelvis was performed using the standard protocol following bolus administration of intravenous contrast. Creatinine was obtained on site at [facility] at [address] Results: Creatinine 1.0 mg/dL. CONTRAST:  165mL ISOVUE-300 IOPAMIDOL (ISOVUE-300) INJECTION 61% COMPARISON:  12/21/2011 CT abdomen/ pelvis. FINDINGS: Lower chest: No significant pulmonary nodules or acute consolidative airspace disease. Hepatobiliary: Normal liver size. Several simple liver cysts, largest 6.3 cm in the left liver lobe. Several subcentimeter hypodense liver lesions scattered throughout the liver, too small to characterize, not convincingly changed since 12/21/2011, suggesting benign liver lesions. Cholelithiasis, with no gallbladder distention, gallbladder wall thickening or pericholecystic fluid . No biliary ductal dilatation. Pancreas: Normal, with no mass or duct dilation. Spleen: Normal size. No mass. Adrenals/Urinary Tract: No discrete adrenal nodules. Simple 1.9 cm upper left renal cyst. Several subcentimeter hypodense renal cortical lesions scattered throughout both kidneys are too small to characterize and require no further follow-up. Small left extrarenal pelvis. No hydronephrosis. Nonobstructing 10 mm upper, 7 mm lower and 6 mm lower left renal stones. Nonobstructing 2 mm lower right renal stone. Normal bladder. Stomach/Bowel: Grossly normal stomach. Status post ileocecal resection with intact appearing ileocolic anastomosis in the right lower abdomen. Expected small bowel dilatation at  the site of an enteroenterostomy in the ventral mid abdomen. Otherwise normal caliber small bowel loops with no focal small bowel caliber transition. Mild wall thickening in the neo terminal ileum. There is a complex colocutaneous gas containing fistula with thick enhancing wall in the ventral right abdominal wall arising from the proximal transverse colon with a branching fistula tract extending to the skin surface in two locations, one located in the lateral ventral right abdominal wall and one located more superiorly in the midline upper ventral abdominal wall at the site of an open wound with overlying bandaging. There is a subcutaneous 4.6 x 4.3 x 4.3 cm gas containing abscess in the lateral ventral right abdominal wall (series 2/image 43) associated with the lateral aspect of the colocutaneous fistula tract. There is a thick walled interloop fistula associated with the colocutaneous fistula at the anterior margin of the proximal transverse colon (series 2/image 42). No additional sites of large bowel wall thickening. Oral contrast progresses to the distal rectum. Large amount of stool in the distal colon and rectum. Vascular/Lymphatic: Atherosclerotic nonaneurysmal abdominal aorta. Patent portal, splenic, hepatic and renal veins.  No pathologically enlarged lymph nodes in the abdomen or pelvis. Reproductive: Mildly enlarged prostate. Other: No ascites. No additional intraperitoneal fluid collections. Diastasis of the entire midline ventral abdominal wall. Musculoskeletal: No aggressive appearing focal osseous lesions. 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 Mild thoracolumbar spondylosis. Surgical pin in the left proximal femoral metaphysis. IMPRESSION: 1. 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 Subcutaneous gas containing abscess in the lateral right ventral abdominal wall associated with the lateral portion of the colocutaneous fistula tract. 2. No evidence of bowel obstruction. 3. Additional findings include aortic atherosclerosis, cholelithiasis, nonobstructing bilateral nephrolithiasis and mildly enlarged prostate. Electronically Signed   By: Ilona Sorrel M.D.   On: 01/02/2017 11:30      EKG: Independently reviewed.    Assessment/Plan Principal Problem: Diabetes mellitus Will monitor CBGs on Levemir and sliding scale insulin, at least for 24 hours Hemoglobin A1c 7.4 Will consider placing on glipizide 5 mg twice a day to begin with patient needs to follow-up with PCP, could consider adding Januvia or metformin in the near future Patient needs to be provided with a glucometer for  Accu-Cheks 3 times a day Strongly recommend outpatient ophthalmology evaluation    Hypokalemia/hypomagnesemia Would repeat potassium and magnesium prior to discharge    Abdominal wall abscess Status post incision and debridement by Dr. Hassell Done Antibiotics as per surgery     Crohn's colitis, with abscess ([place]), status post terminal ileal resection Does not seem to be having an exacerbation Currently not on any home medications       DVT prophylaxis:  heparin     Code Status Orders          consults called: [facility]  Family Communication: Admission, patients condition and plan of care including tests being ordered have been discussed with the patient  who indicates understanding and agree with the plan and Code Status     Disposition plan: Further plan will depend as patient's clinical course evolves and further radiologic and laboratory data become available. Likely home when stable   At the time of admission, it appears that the appropriate admission status for this patient is INPATIENT .Thisis judged to be reasonable and necessary in order to provide the required  intensity of service to ensure the patient's safetygiven thepresenting symptoms, physical exam findings, and initial radiographic and laboratory data in the context of their chronic comorbidities.   Reyne Dumas MD [facility] Pager [phone]  If 7PM-7AM, please contact night-coverage [email or web address] Password [email or web address]  01/12/2017, 11:09 AM

## 2017-01-12 NOTE — Plan of Care (Signed)
Problem: Education: Goal: Ability to describe self-care measures that may prevent or decrease complications (Diabetes Survival Skills Education) will improve Outcome: Adequate for Discharge Patient ordered Living well with diabetes educational booklet Reviewed meal planning Glucose meter how and when to check glucose Taking oral medications Reviewed/discussed/s/s and treatment for both hypo and hyperglycemia  Problem: Health Behavior: Goal: Ability to identify and utilize available resources and services will improve Outcome: Completed/Met Date Met: 01/12/17 DM Coordinator spoke to. Pt declined Outpatient education at this time but is willing after he recovers. Goal: Ability to manage health-related needs will improve Outcome: Adequate for Discharge Encourage to establish care with PCP and follow up with A1c levels every 3-6 months Patient and wife ready to manage glucose levels through medication and monitoring and diet changes Order for glucose monitor written in DM coordinator note. Spoke with Earnstine Regal PA about meter  Problem: Metabolic: Goal: Ability to maintain appropriate glucose levels will improve Outcome: Completed/Met Date Met: 01/12/17 Patient knew when to check glucose and when to take medications. Patient verbalized what to do for hypoglycemia(drink 1/2 cup of Juice, chew 4 glucose tablets)  Problem: Nutritional: Goal: Maintenance of adequate nutrition will improve Outcome: Completed/Met Date Met: 01/12/17 Discussed diet changes.  Problem: Physical Regulation: Goal: Diagnostic test results will improve Outcome: Completed/Met Date Met: 01/12/17 Patient informed of A1c

## 2017-02-06 ENCOUNTER — Ambulatory Visit: Payer: 59 | Admitting: Family Medicine

## 2017-02-23 ENCOUNTER — Ambulatory Visit: Payer: 59 | Admitting: Family Medicine

## 2017-03-16 ENCOUNTER — Ambulatory Visit: Payer: 59 | Admitting: Family Medicine

## 2020-04-29 DIAGNOSIS — Z0279 Encounter for issue of other medical certificate: Secondary | ICD-10-CM

## 2020-11-10 ENCOUNTER — Other Ambulatory Visit: Payer: Self-pay | Admitting: Urology

## 2020-11-10 ENCOUNTER — Other Ambulatory Visit (HOSPITAL_COMMUNITY): Payer: Self-pay | Admitting: Urology

## 2020-11-10 DIAGNOSIS — N200 Calculus of kidney: Secondary | ICD-10-CM

## 2020-12-16 NOTE — Progress Notes (Signed)
COVID Vaccine Completed: Date COVID Vaccine completed: COVID vaccine manufacturer: Pfizer    Moderna   [employer]   PCP -  Cardiologist -   Chest x-ray -  EKG -  Stress Test -  ECHO -  Cardiac Cath -  Pacemaker/ICD device last checked:  Sleep Study -  CPAP -   Fasting Blood Sugar -  Checks Blood Sugar _____ times a day  Blood Thinner Instructions: Aspirin Instructions: Last Dose:  Anesthesia review:   Patient denies shortness of breath, fever, cough and chest pain at PAT appointment   Patient verbalized understanding of instructions that were given to them at the PAT appointment. Patient was also instructed that they will need to review over the PAT instructions again at home before surgery. 

## 2020-12-16 NOTE — Patient Instructions (Addendum)
DUE TO COVID-19 ONLY ONE VISITOR IS ALLOWED TO COME WITH YOU AND STAY IN THE WAITING ROOM ONLY DURING PRE OP AND PROCEDURE.   IF YOU WILL BE ADMITTED INTO THE HOSPITAL YOU ARE ALLOWED ONE SUPPORT PERSON DURING VISITATION HOURS ONLY (10AM -8PM)   . The support person may change daily. . The support person must pass our screening, gel in and out, and wear a mask at all times, including in the patient's room. . Patients must also wear a mask when staff or their support person are in the room.   COVID SWAB TESTING MUST BE COMPLETED ON:   Tuesday, 12-21-20 @   [address] [place], [place] [address]  (Must self quarantine after testing. Follow instructions on handout.)        Your procedure is scheduled on: Friday, 12-24-20   Report to [facility] Main  Entrance   Report to admitting at 8:00 AM   Call this number if you have problems the morning of surgery [phone]   Do not eat food or drink liquids :After Midnight.   CLEAR LIQUID DIET  Foods Allowed                                                                     Foods Excluded  Water, Black Coffee and tea, regular and decaf           liquids that you cannot  Plain Jell-O in any flavor  (No red)                                  see through such as: Fruit ices (not with fruit pulp)                                      milk, soups, orange juice              Iced Popsicles (No red)                                      All solid food                                   Apple juices Sports drinks like Gatorade (No red) Lightly seasoned clear broth or consume(fat free) Sugar, honey syrup    Oral Hygiene is also important to reduce your risk of infection.                                    Remember - BRUSH YOUR TEETH THE MORNING OF SURGERY WITH YOUR REGULAR TOOTHPASTE   Do NOT smoke after Midnight   Take these medicines the morning of surgery with A SIP OF WATER: Mesalamine (Lialda)   How to Manage Your Diabetes Before  and After Surgery  Why is it important to control my blood sugar before and after surgery? Marland Kitchen  Improving blood sugar levels before and after surgery helps healing and can limit problems. . A way of improving blood sugar control is eating a healthy diet by: o  Eating less sugar and carbohydrates o  Increasing activity/exercise o  Talking with your doctor about reaching your blood sugar goals . High blood sugars (greater than 180 mg/dL) can raise your risk of infections and slow your recovery, so you will need to focus on controlling your diabetes during the weeks before surgery. . Make sure that the doctor who takes care of your diabetes knows about your planned surgery including the date and location.  How do I manage my blood sugar before surgery? . Check your blood sugar at least 4 times a day, starting 2 days before surgery, to make sure that the level is not too high or low. o Check your blood sugar the morning of your surgery when you wake up and every 2 hours until you get to the Short Stay unit. . If your blood sugar is less than 70 mg/dL, you will need to treat for low blood sugar: o Do not take insulin. o Treat a low blood sugar (less than 70 mg/dL) with  cup of clear juice (cranberry or apple), 4 glucose tablets, OR glucose gel. o Recheck blood sugar in 15 minutes after treatment (to make sure it is greater than 70 mg/dL). If your blood sugar is not greater than 70 mg/dL on recheck, call [phone] for further instructions. . Report your blood sugar to the short stay nurse when you get to Short Stay.  . If you are admitted to the hospital after surgery: o Your blood sugar will be checked by the staff and you will probably be given insulin after surgery (instead of oral diabetes medicines) to make sure you have good blood sugar levels. o The goal for blood sugar control after surgery is 80-180 mg/dL.   WHAT DO I DO ABOUT MY DIABETES MEDICATION?  Marland Kitchen Do not take oral diabetes  medicines (pills) the morning of surgery.  . THE DAY BEFORE SURGERY:  Take Glipizide as prescribed, no evening dose.       . THE MORNING OF SURGERY:  Do not take Glipizide.   Reviewed and Endorsed by [facility] Patient Education Committee, August 2015                            You may not have any metal on your body including jewelry, and body piercings             Do not wear  lotions, powders, perfumes/cologne, or deodorant             Men may shave face and neck.   Do not bring valuables to the hospital. [place].   Contacts, dentures or bridgework may not be worn into surgery.   Bring small overnight bag day of surgery.   Special Instructions: Bring a copy of your healthcare power of attorney and living will documents         the day of surgery if you haven't scanned them in before.              Please read over the following fact sheets you were given: IF YOU HAVE QUESTIONS ABOUT YOUR PRE OP INSTRUCTIONS PLEASE CALL [phone]   [facility] - Preparing for Surgery Before surgery, you can play an important role.  Because skin is not sterile, your skin needs to be as free of germs as possible.  You can reduce the number of germs on your skin by washing with CHG (chlorahexidine gluconate) soap before surgery.  CHG is an antiseptic cleaner which kills germs and bonds with the skin to continue killing germs even after washing. Please DO NOT use if you have an allergy to CHG or antibacterial soaps.  If your skin becomes reddened/irritated stop using the CHG and inform your nurse when you arrive at Short Stay. Do not shave (including legs and underarms) for at least 48 hours prior to the first CHG shower.  You may shave your face/neck.  Please follow these instructions carefully:  1.  Shower with CHG Soap the night before surgery and the  morning of surgery.  2.  If you choose to wash your hair, wash your hair first as usual with your normal   shampoo.  3.  After you shampoo, rinse your hair and body thoroughly to remove the shampoo.                             4.  Use CHG as you would any other liquid soap.  You can apply chg directly to the skin and wash.  Gently with a scrungie or clean washcloth.  5.  Apply the CHG Soap to your body ONLY FROM THE NECK DOWN.   Do   not use on face/ open                           Wound or open sores. Avoid contact with eyes, ears mouth and   genitals (private parts).                       Wash face,  Genitals (private parts) with your normal soap.             6.  Wash thoroughly, paying special attention to the area where your    surgery  will be performed.  7.  Thoroughly rinse your body with warm water from the neck down.  8.  DO NOT shower/wash with your normal soap after using and rinsing off the CHG Soap.                9.  Pat yourself dry with a clean towel.            10.  Wear clean pajamas.            11.  Place clean sheets on your bed the night of your first shower and do not  sleep with pets. Day of Surgery : Do not apply any lotions/deodorants the morning of surgery.  Please wear clean clothes to the hospital/surgery center.  FAILURE TO FOLLOW THESE INSTRUCTIONS MAY RESULT IN THE CANCELLATION OF YOUR SURGERY  PATIENT SIGNATURE_________________________________  NURSE SIGNATURE__________________________________  ________________________________________________________________________

## 2020-12-17 ENCOUNTER — Encounter (HOSPITAL_COMMUNITY)
Admission: RE | Admit: 2020-12-17 | Discharge: 2020-12-17 | Disposition: A | Discharge status: Home / Self Care | Payer: 59 | Source: Ambulatory Visit | Attending: Anesthesiology | Admitting: Anesthesiology

## 2020-12-24 ENCOUNTER — Ambulatory Visit (HOSPITAL_COMMUNITY): Payer: 59

## 2020-12-24 ENCOUNTER — Other Ambulatory Visit (HOSPITAL_COMMUNITY): Payer: 59

## 2020-12-24 ENCOUNTER — Ambulatory Visit (HOSPITAL_COMMUNITY): Admission: RE | Admit: 2020-12-24 | Payer: 59 | Source: Home / Self Care | Admitting: Urology

## 2020-12-24 ENCOUNTER — Encounter (HOSPITAL_COMMUNITY): Admission: RE | Payer: Self-pay | Source: Home / Self Care

## 2020-12-24 SURGERY — NEPHROLITHOTOMY PERCUTANEOUS
Anesthesia: General | Laterality: Left

## 2021-03-07 DIAGNOSIS — Z0279 Encounter for issue of other medical certificate: Secondary | ICD-10-CM

## 2021-06-13 ENCOUNTER — Other Ambulatory Visit: Payer: Self-pay

## 2021-06-13 DIAGNOSIS — N200 Calculus of kidney: Secondary | ICD-10-CM | POA: Insufficient documentation

## 2021-06-13 DIAGNOSIS — M7989 Other specified soft tissue disorders: Secondary | ICD-10-CM

## 2021-06-16 ENCOUNTER — Encounter (HOSPITAL_COMMUNITY): Payer: 59

## 2021-06-16 ENCOUNTER — Encounter: Payer: 59 | Admitting: Vascular Surgery

## 2021-06-23 ENCOUNTER — Other Ambulatory Visit: Payer: Self-pay

## 2021-06-23 ENCOUNTER — Ambulatory Visit (INDEPENDENT_AMBULATORY_CARE_PROVIDER_SITE_OTHER): Payer: 59 | Admitting: Vascular Surgery

## 2021-06-23 ENCOUNTER — Ambulatory Visit (HOSPITAL_COMMUNITY)
Admission: RE | Admit: 2021-06-23 | Discharge: 2021-06-23 | Disposition: A | Discharge status: Home / Self Care | Payer: 59 | Source: Ambulatory Visit | Attending: Vascular Surgery | Admitting: Vascular Surgery

## 2021-06-23 ENCOUNTER — Encounter: Payer: Self-pay | Admitting: Vascular Surgery

## 2021-06-23 VITALS — BP 130/76 | HR 86 | Temp 97.8°F | Resp 20 | Ht 66.0 in | Wt 168.0 lb

## 2021-06-23 DIAGNOSIS — M7989 Other specified soft tissue disorders: Secondary | ICD-10-CM | POA: Insufficient documentation

## 2021-06-23 NOTE — Progress Notes (Addendum)
Referring Physician: Berna Spare PA  Patient name: [name] MRN: [id number] DOB: 04-26-1956 Sex: male  REASON FOR CONSULT: Leg swelling  HPI: [name] is a 65 y.o. male, with a 6-week history of leg swelling left worse than right.  Patient states he never really had any swelling in his legs prior to 6 weeks ago.  He has had several prior orthopedic operations.  He had some type of internal fixation of his left ankle.  He had some type of reconstruction of his left hip.  All this was done at [facility] and we have no records to review regarding this.  He states the left hip operation they took something from his left leg and put it in his hip.  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  He does not really describe claudication.  He states he has pain in the left leg but this seems to be more due to his left hip pain and the swelling.  He has never had a prior DVT.  He is a former smoker and quit several years ago.  He has no shortness of breath or chest pain.    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 LAP. W/ ENTEROLYSIS, RESECTION INFLAMMATORY MASS RLQ / TAKE-DOWN OF  FISTULA WITH ENTEROENTEROSTOMY/ RESECTION WITH THE SMALL BOWEL TO ASCENDING COLON ANASTOMOSIS  10-29-2000   CROHN'S DISEASE W/ ANASTOMOTIC INFLAMMATORY MASS/    10-30-2000 EXPL. LAP. CONTROL POST-OP ABD. BLEEDING   EXPLORATORY LAPAROTOMY/ RESECTION OF ILEOCOLONIC ANASTOMOSIS AND CREATION OF ILEOSTOMY  11-04-2000   ABD. PERFORATION   ILEOSTOMY CLOSURE N/A 07/23/2015   Procedure: Takedown ileostomy and repair of ostomy hernia, extensive entrolysis (2.5 hrs), ileostransverse colon anastomosis; closure of massive ventral hernia with 20 x 30 Strattice mesh;  Surgeon: Johnathan Hausen, MD;  Location: [facility];  Service: General;  Laterality: N/A;   IRRIGATION AND DEBRIDEMENT ABSCESS Right 09/09/2014   Procedure: MINOR INCISION AND DRAINAGE OF ABSCESS;  Surgeon: Charlotte Crumb, MD;  Location: [facility];  Service: Orthopedics;  Laterality: Right;  incision and drainage right long finger    IRRIGATION AND DEBRIDEMENT ABSCESS N/A 01/03/2017   Procedure: IRRIGATION AND DEBRIDEMENT ABDOMINAL WALL ABSCESS;  Surgeon: Johnathan Hausen, MD;  Location: [facility];  Service: General;  Laterality: N/A;   LAPAROSCOPY N/A 07/23/2015   Procedure: LAPAROSCOPY DIAGNOSTIC;  Surgeon: Johnathan Hausen, MD;  Location: [facility];  Service: General;  Laterality: N/A;   ORIF ANKLE FRACTURE Left 08/27/2014   Procedure:  open reduction internal fixation left ankle;  Surgeon: Nita Sells, MD;  Location:  [facility];  Service: Orthopedics;  Laterality: Left;  Left open reduction internal fixation ankle   RESECTION OF TERMINAL ILEUM  1983   RIGHT URETEROSCOPIC STONE EXTRACITON  04-24-2005  & 12-01-2002    Family History  Problem Relation Age of Onset   Alzheimer's disease Father    Cancer Father    Cancer Sister     SOCIAL HISTORY: Social History   Socioeconomic History   Marital status: Married    Spouse name: Not on file   Number of children: Not on file   Years of education: Not on file   Highest education  level: Not on file  Occupational History   Not on file  Tobacco Use   Smoking status: Former    Types: Cigarettes    Quit date: 08/26/1994    Years since quitting: 26.8   Smokeless tobacco: Never  Vaping Use   Vaping Use: Never used  Substance and Sexual Activity   Alcohol use: No   Drug use: No   Sexual activity: Never  Other Topics Concern   Not on file  Social History Narrative   Not on file   Social Determinants of Health   Financial Resource Strain: Not on file  Food Insecurity: Not on file  Transportation Needs: Not on file  Physical Activity: Not on file  Stress: Not on file  Social Connections: Not on file  Intimate Partner Violence: Not on file    No Known Allergies  Current Outpatient Medications  Medication Sig Dispense Refill   artificial tears (LACRILUBE) OINT ophthalmic ointment Place into the left eye every 8 (eight) hours. 2.5 g 0   blood glucose meter kit and supplies Dispense based on patient and insurance preference. Use up to four times daily as directed. (FOR ICD-9 250.00, 250.01). 1 each 0   tamsulosin (FLOMAX) 0.4 MG CAPS capsule Take 1 capsule (0.4 mg total) by mouth daily after supper. 30 capsule 0   glipiZIDE (GLUCOTROL) 5 MG tablet Take 1 tablet (5 mg total) by mouth 2 (two) times daily. 60 tablet 1   losartan (COZAAR) 100 MG tablet Take by mouth.     metFORMIN (GLUCOPHAGE) 500 MG tablet Take 500 mg by mouth 2 (two) times daily.     potassium citrate (UROCIT-K) 10 MEQ (1080 MG) SR tablet potassium citrate ER 10 mEq (1,080 mg) tablet,extended release     tadalafil (CIALIS) 20 MG tablet Take 20 mg by mouth daily as needed.     No current facility-administered medications for this visit.    ROS:   General:  No weight loss, Fever, chills  HEENT: No recent headaches, no nasal bleeding, no visual changes, no sore throat  Neurologic: No dizziness, blackouts, seizures. No recent symptoms of stroke or mini- stroke. No recent episodes of slurred  speech, or temporary blindness.  Cardiac: No recent episodes of chest pain/pressure, no shortness of breath at rest.  No shortness of breath with exertion.  Denies history of atrial fibrillation or irregular heartbeat  Vascular: No history of rest pain in feet.  No history of claudication.  No history of non-healing ulcer, No history of DVT   Pulmonary: No home oxygen, no productive cough, no hemoptysis,  No asthma or wheezing  Musculoskeletal:  _0  Arthritis, _1  Low back pain,  _2  Joint pain  Hematologic:No history of hypercoagulable state.  No history of easy bleeding.  No history of anemia  Gastrointestinal: No hematochezia or melena,  No gastroesophageal reflux, no trouble swallowing  Urinary: _3   chronic Kidney disease, _0  on HD - _1  MWF or _2  TTHS, _3  Burning with urination, _4  Frequent urination, _5  Difficulty urinating;   Skin: No rashes  Psychological: No history of anxiety,  No history of depression   Physical Examination  Vitals:   06/23/21 1608  BP: 130/76  Pulse: 86  Resp: 20  Temp: 97.8 F (36.6 C)  SpO2: 95%  Weight: 168 lb (76.2 kg)  Height: _6  (1.676 m)    Body mass index is 27.12 kg/m.  General:  Alert and oriented, no acute distress HEENT: Normal Neck: No JVD Cardiac: Regular Rate and Rhythm  Abdomen: Soft, non-tender, protuberant with draining sinus tract right upper quadrant chevron shaped scar abdomen no mass,  Skin: No rash, chronic appearing edema extending from the knee down to the foot left leg right side also has some edema but not as severe as the left the left is about 5% larger.  There is some warmth to the left leg.  There is no obvious focal area of erythema or area of fluctuance in the left lower extremity. Extremity Pulses:  2+ radial, brachial, femoral, dorsalis pedis, posterior tibial pulses bilaterally Musculoskeletal: No deformity, above-mentioned edema, left lateral calf well healed scar left medial malleolus well healed  scar, left lateral hip well healed scar Neurologic: Upper and lower extremity motor 5/5 and symmetric  DATA:  Patient had a venous duplex today which showed no evidence of DVT and no reflux in the superficial or deep venous system.  ASSESSMENT: Lower extremity edema unknown etiology patient with multiple prior laparotomies and inflammatory bowel disease may be at risk of iliac or caval narrowing or possible lymphatic obstruction.  Also may be related to his prior orthopedic procedures and degenerative changes.  He does not have significant lower extremity arterial or venous disease by clinical and duplex exam   PLAN: We will obtain CT scan of abdomen and pelvis with oral and IV contrast to rule out lymphatic obstruction or other intra-abdominal venous pathology.  If CT scan is negative would defer primarily I guess at this point to orthopedics.  His orthopedic referral note stated that they were referring him to Dr. Lyla Glassing for evaluation.  We will see if we can get this appointment moved up so that Dr. Lyla Glassing can see him sooner.  He will see one of my partners in the next 1 to 2 weeks with a CT scan of the abdomen and pelvis results  Eric Hinds, MD [facility]: [phone]

## 2021-06-24 ENCOUNTER — Other Ambulatory Visit: Payer: Self-pay

## 2021-06-24 ENCOUNTER — Telehealth: Payer: Self-pay | Admitting: Vascular Surgery

## 2021-06-24 DIAGNOSIS — M7989 Other specified soft tissue disorders: Secondary | ICD-10-CM

## 2021-06-24 DIAGNOSIS — Z87898 Personal history of other specified conditions: Secondary | ICD-10-CM

## 2021-06-24 DIAGNOSIS — T8143XA Infection following a procedure, organ and space surgical site, initial encounter: Secondary | ICD-10-CM

## 2021-06-24 NOTE — Telephone Encounter (Signed)
Spoke with [facility]. They have received fax for eval. MD will look over notes and call pt to  make an appt

## 2021-06-28 ENCOUNTER — Other Ambulatory Visit: Payer: Self-pay

## 2021-06-28 DIAGNOSIS — Z87898 Personal history of other specified conditions: Secondary | ICD-10-CM

## 2021-06-28 DIAGNOSIS — M7989 Other specified soft tissue disorders: Secondary | ICD-10-CM

## 2021-06-30 ENCOUNTER — Ambulatory Visit (HOSPITAL_BASED_OUTPATIENT_CLINIC_OR_DEPARTMENT_OTHER): Admission: RE | Admit: 2021-06-30 | Payer: 59 | Source: Ambulatory Visit

## 2021-07-04 NOTE — Progress Notes (Deleted)
Referring Physician: Berna Spare PA  Patient name: [name] MRN: [id number] DOB: 04-26-1956 Sex: male  REASON FOR CONSULT: Leg swelling  HPI: [name] is a 65 y.o. male, with a 6-week history of leg swelling left worse than right.  Patient states he never really had any swelling in his legs prior to 6 weeks ago.  He has had several prior orthopedic operations.  He had some type of internal fixation of his left ankle.  He had some type of reconstruction of his left hip.  All this was done at [facility] and we have no records to review regarding this.  He states the left hip operation they took something from his left leg and put it in his hip.  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  He does not really describe claudication.  He states he has pain in the left leg but this seems to be more due to his left hip pain and the swelling.  He has never had a prior DVT.  He is a former smoker and quit several years ago.  He has no shortness of breath or chest pain.    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 LAP. W/ ENTEROLYSIS, RESECTION INFLAMMATORY MASS RLQ / TAKE-DOWN OF  FISTULA WITH ENTEROENTEROSTOMY/ RESECTION WITH THE SMALL BOWEL TO ASCENDING COLON ANASTOMOSIS  10-29-2000   CROHN'S DISEASE W/ ANASTOMOTIC INFLAMMATORY MASS/    10-30-2000 EXPL. LAP. CONTROL POST-OP ABD. BLEEDING   EXPLORATORY LAPAROTOMY/ RESECTION OF ILEOCOLONIC ANASTOMOSIS AND CREATION OF ILEOSTOMY  11-04-2000   ABD. PERFORATION   ILEOSTOMY CLOSURE N/A 07/23/2015   Procedure: Takedown ileostomy and repair of ostomy hernia, extensive entrolysis (2.5 hrs), ileostransverse colon anastomosis; closure of massive ventral hernia with 20 x 30 Strattice mesh;  Surgeon: Johnathan Hausen, MD;  Location: [facility];  Service: General;  Laterality: N/A;   IRRIGATION AND DEBRIDEMENT ABSCESS Right 09/09/2014   Procedure: MINOR INCISION AND DRAINAGE OF ABSCESS;  Surgeon: Charlotte Crumb, MD;  Location: [place];  Service: Orthopedics;  Laterality: Right;  incision and drainage right long finger    IRRIGATION AND DEBRIDEMENT ABSCESS N/A 01/03/2017   Procedure: IRRIGATION AND DEBRIDEMENT ABDOMINAL WALL ABSCESS;  Surgeon: Johnathan Hausen, MD;  Location: [facility];  Service: General;  Laterality: N/A;   LAPAROSCOPY N/A 07/23/2015   Procedure: LAPAROSCOPY DIAGNOSTIC;  Surgeon: Johnathan Hausen, MD;  Location: [facility];  Service: General;  Laterality: N/A;   ORIF ANKLE FRACTURE Left 08/27/2014   Procedure:  open reduction internal fixation left ankle;  Surgeon: Nita Sells, MD;  Location:  [place] [facility];  Service: Orthopedics;  Laterality: Left;  Left open reduction internal fixation ankle   RESECTION OF TERMINAL ILEUM  1983   RIGHT URETEROSCOPIC STONE EXTRACITON  04-24-2005  & 12-01-2002    Family History  Problem Relation Age of Onset   Alzheimer's disease Father    Cancer Father    Cancer Sister     SOCIAL HISTORY: Social History   Socioeconomic History   Marital status: Married    Spouse name: Not on file   Number of children: Not on file   Years of education: Not on file   Highest education  level: Not on file  Occupational History   Not on file  Tobacco Use   Smoking status: Former    Types: Cigarettes    Quit date: 08/26/1994    Years since quitting: 26.8   Smokeless tobacco: Never  Vaping Use   Vaping Use: Never used  Substance and Sexual Activity   Alcohol use: No   Drug use: No   Sexual activity: Never  Other Topics Concern   Not on file  Social History Narrative   Not on file   Social Determinants of Health   Financial Resource Strain: Not on file  Food Insecurity: Not on file  Transportation Needs: Not on file  Physical Activity: Not on file  Stress: Not on file  Social Connections: Not on file  Intimate Partner Violence: Not on file    No Known Allergies  Current Outpatient Medications  Medication Sig Dispense Refill   artificial tears (LACRILUBE) OINT ophthalmic ointment Place into the left eye every 8 (eight) hours. 2.5 g 0   blood glucose meter kit and supplies Dispense based on patient and insurance preference. Use up to four times daily as directed. (FOR ICD-9 250.00, 250.01). 1 each 0   glipiZIDE (GLUCOTROL) 5 MG tablet Take 1 tablet (5 mg total) by mouth 2 (two) times daily. 60 tablet 1   losartan (COZAAR) 100 MG tablet Take by mouth.     metFORMIN (GLUCOPHAGE) 500 MG tablet Take 500 mg by mouth 2 (two) times daily.     potassium citrate (UROCIT-K) 10 MEQ (1080 MG) SR tablet potassium citrate ER 10 mEq (1,080 mg) tablet,extended release     tadalafil (CIALIS) 20 MG tablet Take 20 mg by mouth daily as needed.     tamsulosin (FLOMAX) 0.4 MG CAPS capsule Take 1 capsule (0.4 mg total) by mouth daily after supper. 30 capsule 0   No current facility-administered medications for this visit.    ROS:   General:  No weight loss, Fever, chills  HEENT: No recent headaches, no nasal bleeding, no visual changes, no sore throat  Neurologic: No dizziness, blackouts, seizures. No recent symptoms of stroke or mini- stroke. No recent episodes of slurred  speech, or temporary blindness.  Cardiac: No recent episodes of chest pain/pressure, no shortness of breath at rest.  No shortness of breath with exertion.  Denies history of atrial fibrillation or irregular heartbeat  Vascular: No history of rest pain in feet.  No history of claudication.  No history of non-healing ulcer, No history of DVT   Pulmonary: No home oxygen, no productive cough, no hemoptysis,  No asthma or wheezing  Musculoskeletal:  $RemoveBeforeDEI'[X]'kfmryTKvsKhVVfjy$  Arthritis, $RemoveBef'[ ]'kOSomFNNrD$  Low back pain,  $Remo'[X]'ipJVZ$  Joint pain  Hematologic:No history of hypercoagulable state.  No history of easy bleeding.  No history of anemia  Gastrointestinal: No hematochezia or melena,  No gastroesophageal reflux, no trouble swallowing  Urinary: $RemoveB'[ ]'KVePeaJU$   chronic Kidney disease, [ ]  on HD - [ ]  MWF or [ ]  TTHS, [ ]  Burning with urination, [ ]  Frequent urination, [ ]  Difficulty urinating;   Skin: No rashes  Psychological: No history of anxiety,  No history of depression   Physical Examination  There were no vitals filed for this visit.   There is no height or weight on file to calculate BMI.  General:  Alert and oriented, no acute distress HEENT: Normal Neck: No JVD Cardiac: Regular Rate and Rhythm  Abdomen: Soft, non-tender, protuberant with draining sinus tract right upper quadrant chevron shaped scar abdomen no mass,  Skin: No rash, chronic appearing edema extending from the knee down to the foot left leg right side also has some edema but not as severe as the left the left is about 5% larger.  There is some warmth to the left leg.  There is no obvious focal area of erythema or area of fluctuance in the left lower extremity. Extremity Pulses:  2+ radial, brachial, femoral, dorsalis pedis, posterior tibial pulses bilaterally Musculoskeletal: No deformity, above-mentioned edema, left lateral calf well healed scar left medial malleolus well healed scar, left lateral hip well healed scar Neurologic: Upper and lower extremity motor 5/5  and symmetric  DATA:  Patient had a venous duplex today which showed no evidence of DVT and no reflux in the superficial or deep venous system.  ASSESSMENT: Lower extremity edema unknown etiology patient with multiple prior laparotomies and inflammatory bowel disease may be at risk of iliac or caval narrowing or possible lymphatic obstruction.  Also may be related to his prior orthopedic procedures and degenerative changes.  He does not have significant lower extremity arterial or venous disease by clinical and duplex exam   PLAN: We will obtain CT scan of abdomen and pelvis with oral and IV contrast to rule out lymphatic obstruction or other intra-abdominal venous pathology.  If CT scan is negative would defer primarily I guess at this point to orthopedics.  His orthopedic referral note stated that they were referring him to Dr. Lyla Glassing for evaluation.  We will see if we can get this appointment moved up so that Dr. Lyla Glassing can see him sooner.  He will see one of my partners in the next 1 to 2 weeks with a CT scan of the abdomen and pelvis results  [place], MD [facility]: [phone]

## 2021-07-05 ENCOUNTER — Ambulatory Visit: Payer: 59 | Admitting: Vascular Surgery

## 2021-07-05 ENCOUNTER — Other Ambulatory Visit: Payer: Self-pay

## 2021-07-05 DIAGNOSIS — Z87898 Personal history of other specified conditions: Secondary | ICD-10-CM

## 2021-07-05 DIAGNOSIS — M7989 Other specified soft tissue disorders: Secondary | ICD-10-CM

## 2021-07-12 ENCOUNTER — Encounter (HOSPITAL_COMMUNITY): Payer: Self-pay

## 2021-07-12 ENCOUNTER — Ambulatory Visit (HOSPITAL_COMMUNITY)
Admission: RE | Admit: 2021-07-12 | Discharge: 2021-07-12 | Disposition: A | Discharge status: Home / Self Care | Payer: 59 | Source: Ambulatory Visit | Attending: Vascular Surgery | Admitting: Vascular Surgery

## 2021-07-12 ENCOUNTER — Other Ambulatory Visit: Payer: Self-pay

## 2021-07-12 DIAGNOSIS — M7989 Other specified soft tissue disorders: Secondary | ICD-10-CM | POA: Diagnosis not present

## 2021-07-12 DIAGNOSIS — Z87898 Personal history of other specified conditions: Secondary | ICD-10-CM | POA: Diagnosis present

## 2021-07-12 LAB — POCT I-STAT CREATININE: Creatinine, Ser: 1.1 mg/dL (ref 0.61–1.24)

## 2021-07-12 MED ORDER — IOHEXOL 350 MG/ML SOLN
100.0000 mL | Freq: Once | INTRAVENOUS | Status: AC | PRN
  Administered 2021-07-12: 100 mL via INTRAVENOUS

## 2021-07-13 ENCOUNTER — Encounter: Payer: Self-pay | Admitting: Vascular Surgery

## 2021-07-13 ENCOUNTER — Ambulatory Visit (INDEPENDENT_AMBULATORY_CARE_PROVIDER_SITE_OTHER): Payer: 59 | Admitting: Vascular Surgery

## 2021-07-13 VITALS — BP 124/74 | HR 78 | Temp 97.7°F | Resp 20 | Ht 66.0 in | Wt 164.0 lb

## 2021-07-13 DIAGNOSIS — M7989 Other specified soft tissue disorders: Secondary | ICD-10-CM

## 2021-07-13 NOTE — Progress Notes (Signed)
Patient ID: [name], male   DOB: 27-Nov-1955, 65 y.o.   MRN: [id number]  Reason for Consult: No chief complaint on file.   Referred by No ref. provider found  Subjective:     HPI:  Eric CUMPTON is a 65 y.o. male with now several month history of swelling of his left leg worse than his right.  Patient has previous multiple orthopedic operations of his lower extremities including internal fixation of his left ankle and reconstruction of his left hip performed at [facility].  He does not have any history of personal or family history of DVT.  He is a former smoker quit several years ago.  He is here today for follow-up with CT venogram ordered 3 weeks ago by my partner prior to his retirement.  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   Crohn's disease ([place])    Diabetes mellitus without complication ([place])    History of aseptic necrosis of bone BILATERAL HIPS   S/P BONE GRAFT   History of kidney stones    History of mycosis fungoides    S/P ileostomy ([name])    Family History  Problem Relation Age of Onset   Alzheimer's disease Father    Cancer Father    Cancer Sister    Past Surgical History:  Procedure Laterality Date   BONE GRAFT OF LEFT HIP  1986   ASEPTIC NECROSIS   COLONOSCOPY     CYSTO/ BILATERAL RETROGRADE PYELOGRAM/ LEFT URETERAL STONE EXTRACTION / BILATERAL STENT PLACEMENT  10-07-2003   CYSTOSCOPY WITH LITHOLAPAXY  02/12/2012   Procedure: CYSTOSCOPY WITH LITHOLAPAXY;  Surgeon: Claybon Jabs, MD;  Location: [place];  Service: Urology;  Laterality: N/A;   EXPL. LAP. W/ ENTEROLYSIS, RESECTION INFLAMMATORY MASS RLQ / TAKE-DOWN OF FISTULA WITH ENTEROENTEROSTOMY/ RESECTION WITH THE SMALL BOWEL TO ASCENDING COLON ANASTOMOSIS  10-29-2000   CROHN'S DISEASE W/ ANASTOMOTIC INFLAMMATORY MASS/    10-30-2000 EXPL. LAP. CONTROL POST-OP ABD. BLEEDING   EXPLORATORY LAPAROTOMY/ RESECTION OF ILEOCOLONIC  ANASTOMOSIS AND CREATION OF ILEOSTOMY  11-04-2000   ABD. PERFORATION   ILEOSTOMY CLOSURE N/A 07/23/2015   Procedure: Takedown ileostomy and repair of ostomy hernia, extensive entrolysis (2.5 hrs), ileostransverse colon anastomosis; closure of massive ventral hernia with 20 x 30 Strattice mesh;  Surgeon: Johnathan Hausen, MD;  [facility];  Service: General;  Laterality: N/A;   IRRIGATION AND DEBRIDEMENT ABSCESS Right 09/09/2014   Procedure: MINOR INCISION AND DRAINAGE OF ABSCESS;  Surgeon: Charlotte Crumb, MD;  Location: [place];  Service: Orthopedics;  Laterality: Right;  incision and drainage right long finger    IRRIGATION AND DEBRIDEMENT ABSCESS N/A 01/03/2017   Procedure: IRRIGATION AND DEBRIDEMENT ABDOMINAL WALL ABSCESS;  Surgeon: Johnathan Hausen, MD;  [facility];  Service: General;  Laterality: N/A;   LAPAROSCOPY N/A 07/23/2015   Procedure: LAPAROSCOPY DIAGNOSTIC;  Surgeon: Johnathan Hausen, MD;  [facility];  Service: General;  Laterality: N/A;   ORIF ANKLE FRACTURE Left 08/27/2014   Procedure:  open reduction internal fixation left ankle;  Surgeon: Nita Sells, MD;  Location: [place];  Service: Orthopedics;  Laterality: Left;  Left open reduction internal fixation ankle   RESECTION OF TERMINAL ILEUM  1983   RIGHT URETEROSCOPIC STONE EXTRACITON  04-24-2005  & 12-01-2002    Short Social History:  Social History   Tobacco Use   Smoking status: Former  Types: Cigarettes    Quit date: 08/26/1994    Years since quitting: 26.8   Smokeless tobacco: Never  Substance Use Topics   Alcohol use: No    No Known Allergies  Current Outpatient Medications  Medication Sig Dispense Refill   artificial tears (LACRILUBE) OINT ophthalmic ointment Place into the left eye every 8 (eight) hours. 2.5 g 0   blood glucose meter kit and supplies Dispense based on patient and insurance preference. Use up to four times daily as directed. (FOR ICD-9 250.00, 250.01). 1  each 0   glipiZIDE (GLUCOTROL) 5 MG tablet Take 1 tablet (5 mg total) by mouth 2 (two) times daily. 60 tablet 1   losartan (COZAAR) 100 MG tablet Take by mouth.     metFORMIN (GLUCOPHAGE) 500 MG tablet Take 500 mg by mouth 2 (two) times daily.     potassium citrate (UROCIT-K) 10 MEQ (1080 MG) SR tablet potassium citrate ER 10 mEq (1,080 mg) tablet,extended release     tadalafil (CIALIS) 20 MG tablet Take 20 mg by mouth daily as needed.     tamsulosin (FLOMAX) 0.4 MG CAPS capsule Take 1 capsule (0.4 mg total) by mouth daily after supper. 30 capsule 0   No current facility-administered medications for this visit.    Review of Systems  Constitutional:  Constitutional negative. HENT: HENT negative.  Eyes: Eyes negative.  Cardiovascular: Positive for leg swelling.  GI: Gastrointestinal negative.  Musculoskeletal: Musculoskeletal negative.  Skin: Skin negative.  Neurological: Neurological negative. Hematologic: Hematologic/lymphatic negative.  Psychiatric: Psychiatric negative.       Objective:  Objective   Vitals:   07/13/21 0945  BP: 124/74  Pulse: 78  Resp: 20  Temp: 97.7 F (36.5 C)  SpO2: 94%     Physical Exam HENT:     Head: Normocephalic.     Nose:     Comments: Wearing a mask Eyes:     Pupils: Pupils are equal, round, and reactive to light.  Cardiovascular:     Rate and Rhythm: Normal rate.  Pulmonary:     Effort: Pulmonary effort is normal.  Abdominal:     General: Abdomen is flat.     Palpations: Abdomen is soft.  Musculoskeletal:     Right lower leg: Edema present.     Left lower leg: Edema present.  Skin:    General: Skin is warm.     Capillary Refill: Capillary refill takes less than 2 seconds.  Neurological:     Mental Status: He is alert.  Psychiatric:        Mood and Affect: Mood normal.        Behavior: Behavior normal.        Thought Content: Thought content normal.        Judgment: Judgment normal.    Data: CTIMPRESSION: VASCULAR    1. No evidence of iliac vein compression or ileo caval DVT. 2. Trace aortic atherosclerotic calcifications without evidence of aneurysm or dissection. Aortic Atherosclerosis (ICD10-I70.0).   NON-VASCULAR   1. Suggestion of a very small (1.8 x 1.1 cm) extraluminal fluid collection with a single locule of gas positioned between the colon in the right lower quadrant abdominal wall. This may represent early abscess formation. 2. Surgical changes of prior small bowel and right colon resection with midline enteroenteric anastomosis and right upper quadrant enterocolonic anastomosis. 3. Multiple loops of small bowel closely adherent to the peritoneal lining of the abdomen, particularly in the region of prior surgical wounds healed by secondary intention.  These findings are consistent with adhesive disease. 4. Bilateral nephrolithiasis. 5. 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 6. Cholelithiasis. 7. 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       Assessment/Plan:     65 year-old male here for follow-up of CT scan.  I do not see any outward causes of his bilateral lower extremity edema from his CT scan.  Would defer to orthopedics at this point as there is no vascular option for improving his swelling.     Waynetta Sandy MD [facility]

## 2021-07-26 ENCOUNTER — Ambulatory Visit: Payer: 59 | Admitting: Vascular Surgery

## 2021-08-23 ENCOUNTER — Ambulatory Visit (INDEPENDENT_AMBULATORY_CARE_PROVIDER_SITE_OTHER): Payer: 59

## 2021-08-23 ENCOUNTER — Other Ambulatory Visit: Payer: Self-pay

## 2021-08-23 ENCOUNTER — Encounter: Payer: Self-pay | Admitting: Podiatry

## 2021-08-23 ENCOUNTER — Ambulatory Visit: Payer: 59 | Admitting: Podiatry

## 2021-08-23 DIAGNOSIS — G629 Polyneuropathy, unspecified: Secondary | ICD-10-CM

## 2021-08-23 DIAGNOSIS — M779 Enthesopathy, unspecified: Secondary | ICD-10-CM | POA: Diagnosis not present

## 2021-08-23 DIAGNOSIS — R600 Localized edema: Secondary | ICD-10-CM

## 2021-08-23 MED ORDER — GABAPENTIN 100 MG PO CAPS: 100.0000 mg | ORAL_CAPSULE | Freq: Every evening | ORAL | 3 refills | Status: DC

## 2021-08-23 NOTE — Progress Notes (Signed)
  Subjective:  Patient ID: [name], male    DOB: Mar 02, 1956,   MRN: [id number]  Chief Complaint  Patient presents with   Foot Pain    Bilateral foot pain with left being the worse. Aching pains with edema, tingling and numbness. Pt is diabetic    65 y.o. male presents for bilateral foot pain and swelling that has been going on for several months. Relates he broke his right ankle two years ago and had several operation on the knee and hip on the right. States he has seen his PCP, ortho, and vascular and no one can treat the swelling and pain. Relates occasional tingles and numbness. Hoping for some relief today . Denies any other pedal complaints. Denies n/v/f/c.   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 CFT <3 seconds. Normal hair growth on digits. Bilateral non-pitting lower extremity edema.  Skin. No lacerations or abrasions bilateral feet.  Musculoskeletal: MMT 5/5 bilateral lower extremities in DF, PF, Inversion and Eversion. Deceased ROM in DF of ankle joint. Tender circumferentially around the ankle and midfoot bilateral  Neurological: Sensation intact to light touch.   Assessment:   1. Neuropathy   2. Localized edema      Plan:  Patient was evaluated and treated and all questions answered. Discussed neuropathy and swelling and etiology as well as treatment with patient.  Radiographs reviewed and discussed with patient.  -Prescription for compression stockings provided.  -Discussed supportive shoes at all times and checking feet regularly.  -Gabapentin 100 mg nightly provided to aid with some of the nerve pain.   -Discussed trying topicals such as Biofreeze or lidocaine gel.   -Patient to return in 3 months for follow-up evaluation.    Lorenda Peck, DPM

## 2021-11-23 ENCOUNTER — Ambulatory Visit: Payer: 59 | Admitting: Podiatry

## 2022-03-10 IMAGING — CT CT CTA ABD/PEL W/CM AND/OR W/O CM
2 of 6 series · 14 of 46 positions shown, 16 images · IV contrast (omnipaque)
Comparison: Prior CT abdomen/pelvis 01/10/2017

CLINICAL DATA: History of Crohn's disease with a complex surgical
history including multiple bowel resections and anastomoses. Prior
history of colo enteric fistula and abscess formation. Patient
presents today with concern for recurrent abscess as well as lower
extremity edema concerning for central venous stenosis or DVT.

EXAM:
CTA ABDOMEN AND PELVIS WITHOUT AND WITH CONTRAST
TECHNIQUE: Multidetector CT imaging of the abdomen and pelvis was performed
using the standard protocol during bolus administration of
intravenous contrast. Multiplanar reconstructed images and MIPs were
obtained and reviewed to evaluate the vascular anatomy.
CONTRAST:  100mL OMNIPAQUE IOHEXOL 350 MG/ML SOLN

[Series 2: axial arterial · axial · arterial · 0.87mm/px · z∈[-552,-126]mm · 11 of 247 slices shown, 13 images]
[im 17/247  soft-tissue]
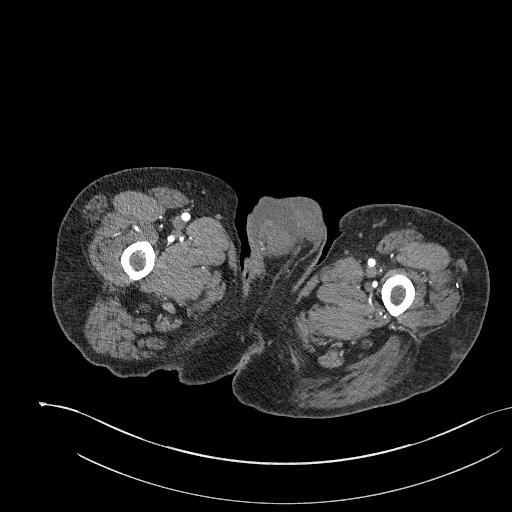
[im 17/247  bone]
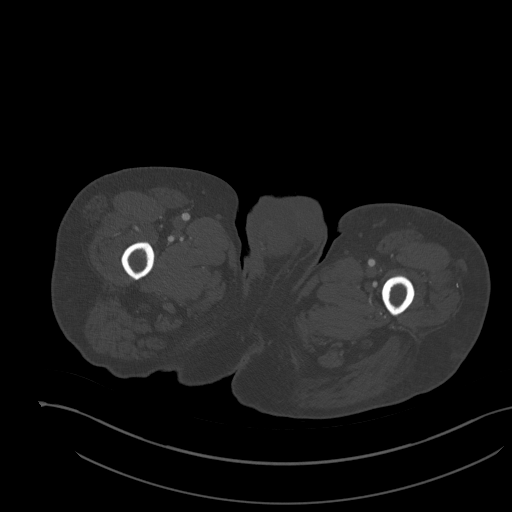
[im 33/247  soft-tissue]
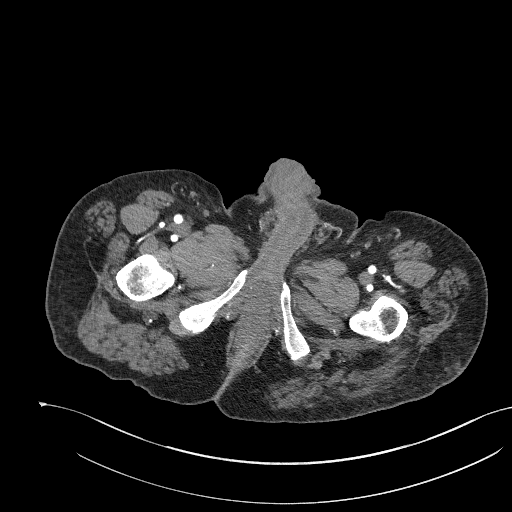
[im 66/247  soft-tissue]
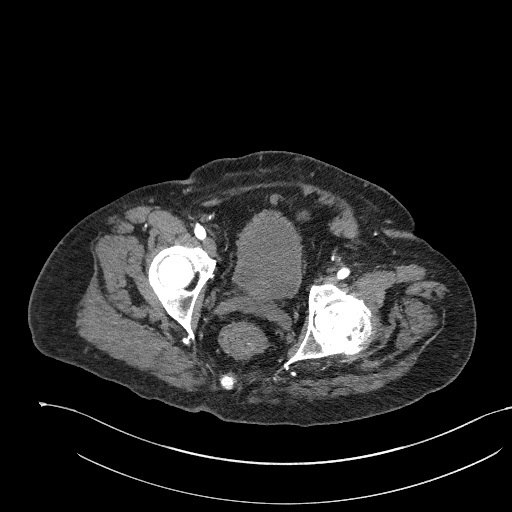
[im 83/247  soft-tissue]
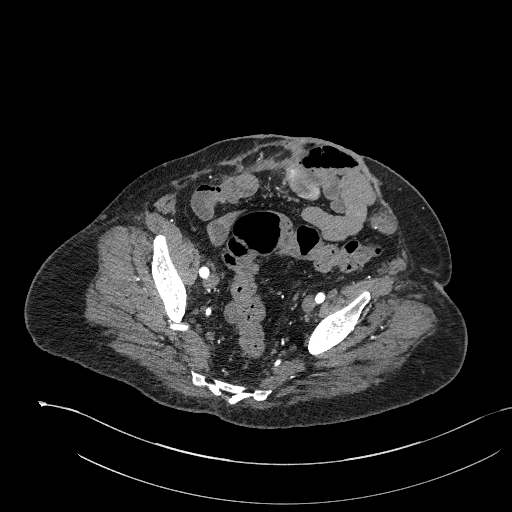
[im 99/247  soft-tissue]
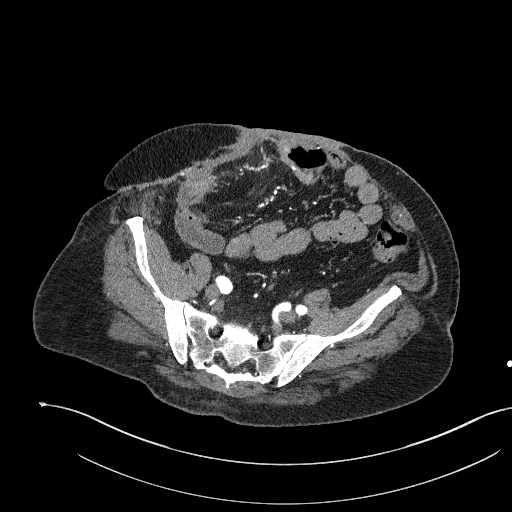
[im 132/247  soft-tissue]
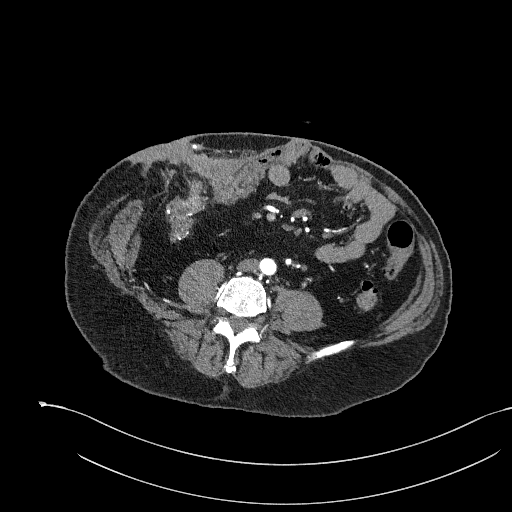
[im 148/247  soft-tissue]
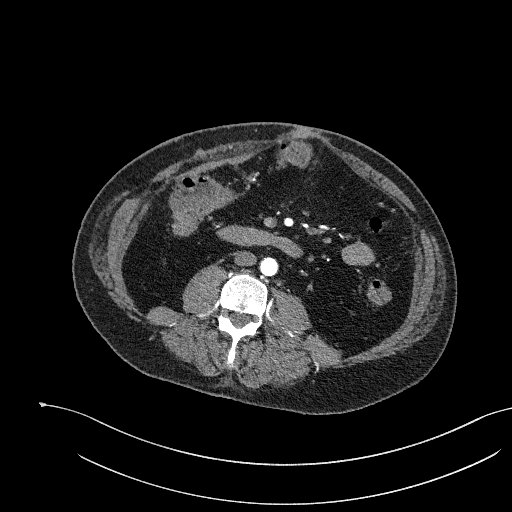
[im 165/247  soft-tissue]
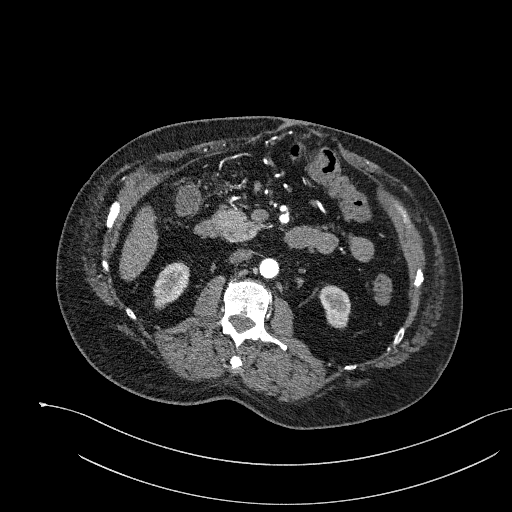
[im 181/247  soft-tissue]
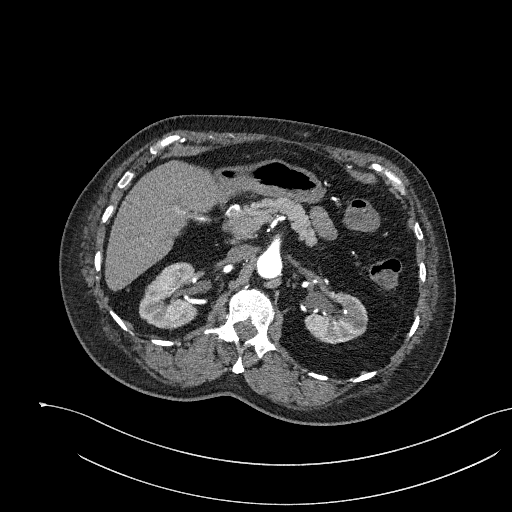
[im 181/247  bone]
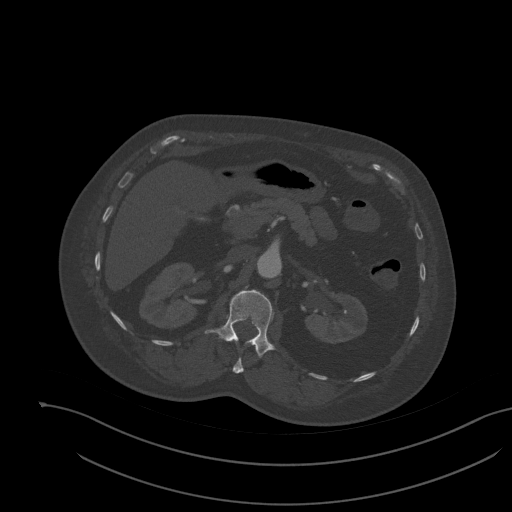
[im 214/247  soft-tissue]
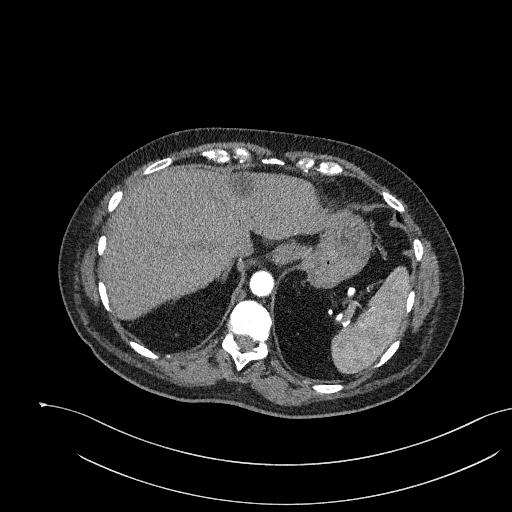
[im 230/247  soft-tissue]
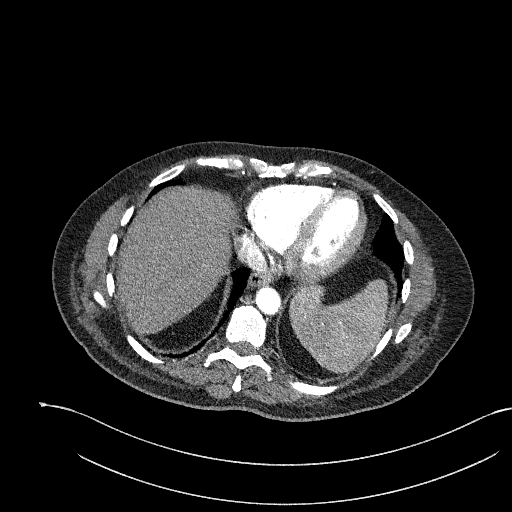

[Series 4: coronal mpr · coronal · 0.86mm/px · 3 of 154 slices shown]
[im 31/154  soft-tissue]
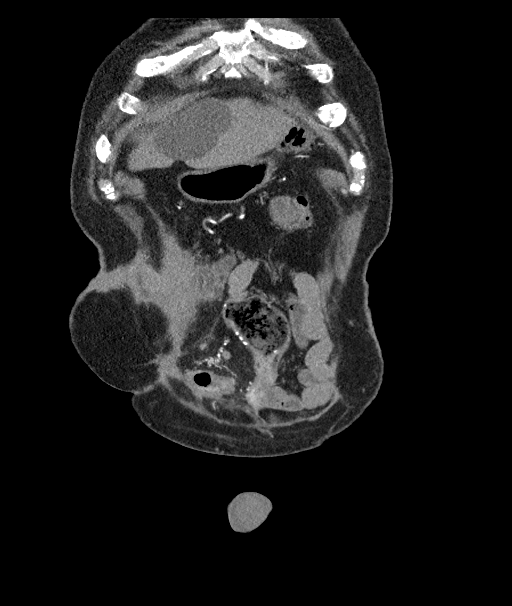
[im 62/154  soft-tissue]
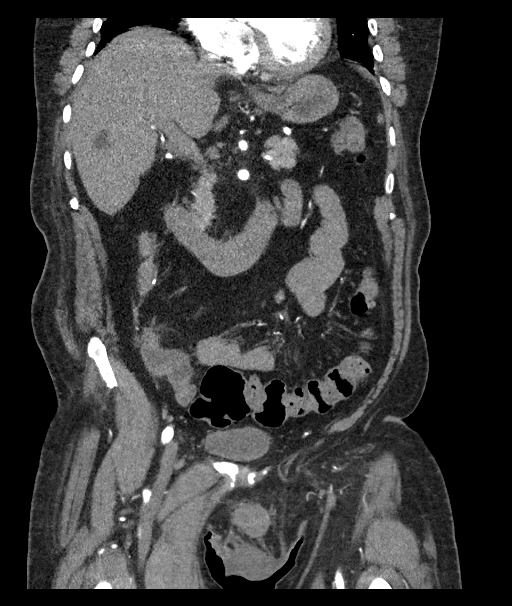
[im 92/154  soft-tissue]
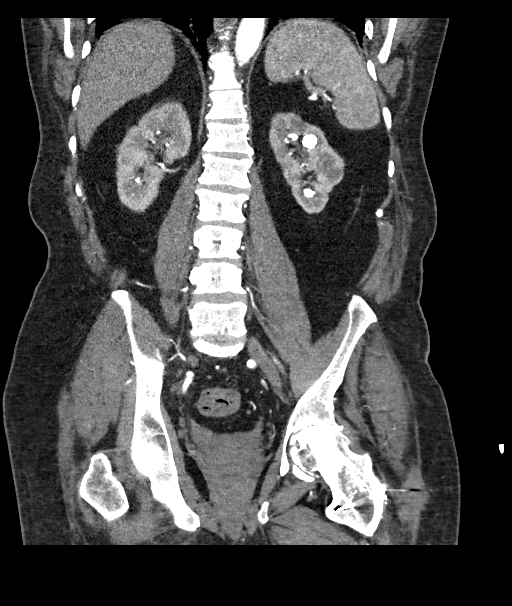

[14 of 46 positions shown; findings below may reference images not displayed]

FINDINGS: VASCULAR

IVC: Normal in caliber. Widely patent. No evidence of DVT,
duplication or other abnormality.

Hepatic/portal: The hepatic and portal veins are widely patent.

Mesenteric: The mesenteric veins are widely patent.

Renals: Normal anatomy of the bilateral renal veins which are patent
and unremarkable.

Iliac veins: No evidence of iliac vein compression. The iliac veins
are normal in caliber, symmetric and widely patent. No extensive
collateral formation.

Arteries: Minimal calcified atherosclerotic plaque along the
abdominal aorta. No evidence of aneurysm or dissection.

Review of the MIP images confirms the above findings.

NON-VASCULAR

Lower chest: No acute abnormality.

Hepatobiliary: Multifocal circumscribed water attenuation cystic
lesions throughout the liver. No significant interval change in the
size, number or pattern of these multifocal simple and minimally
complex cysts. Multiple stones present within the collapsed
gallbladder lumen. No intra or extrahepatic biliary ductal
dilatation.

Pancreas: Unremarkable. No pancreatic ductal dilatation or
surrounding inflammatory changes.

Spleen: Normal in size without focal abnormality.

Adrenals/Urinary Tract: Normal adrenal glands. Multifocal
nephrolithiasis. The largest left-sided kidney stone measures
cm. On the right, the stones are smaller with the largest measuring
up to 0.4 cm. No hydronephrosis. No enhancing renal mass. Multiple
tiny circumscribed low-attenuation renal lesions bilaterally are too
small to characterize but statistically likely to represent benign
cysts. Unremarkable ureters and bladder.

Stomach/Bowel: Extensive prior surgical changes in the abdomen with
evidence of prior small bowel resection with enteroenteric pouch
anastomosis in the midline of the abdomen as well as ileocolonic
anastomosis in the right upper quadrant with evidence of prior
partial right colectomy. Anastomoses appear patent. Question tiny
extraluminal collection between the colon and right lower quadrant
abdominal wall measuring approximately 1.8 x 1.1 cm (image 48 series
4). Dense soft tissue stranding extends from this location to the
abdominal wall surface in the region of prior abscess formation
likely representing residual scar/granulation tissue. No other
intra-abdominal abscess or fluid collection identified.

Lymphatic: No suspicious lymphadenopathy.

Reproductive: Prostate is unremarkable.

Other: No ascites. Surgical changes of prior midline incisions which
have healed by secondary intention. Small bowel closely adherent to
the undersurface of the abdominal wall consistent with underlying
adhesions.

Musculoskeletal: No acute fracture or aggressive appearing lytic or
blastic osseous lesion. Bilateral femoral head avascular necrosis
with associated secondary degenerative osteoarthritis worse on the
left than the right. Surgical changes of prior left femoral neck
surgery.
IMPRESSION: VASCULAR

1. No evidence of iliac vein compression or ileo caval DVT.
2. Trace aortic atherosclerotic calcifications without evidence of
aneurysm or dissection. Aortic Atherosclerosis ([id number]-J52.2).

NON-VASCULAR

1. Suggestion of a very small (1.8 x 1.1 cm) extraluminal fluid
collection with a single locule of gas positioned between the colon
in the right lower quadrant abdominal wall. This may represent early
abscess formation.
2. Surgical changes of prior small bowel and right colon resection
with midline enteroenteric anastomosis and right upper quadrant
enterocolonic anastomosis.
3. Multiple loops of small bowel closely adherent to the peritoneal
lining of the abdomen, particularly in the region of prior surgical
wounds healed by secondary intention. These findings are consistent
with adhesive disease.
4. Bilateral nephrolithiasis.
5. Multifocal hepatic cystic disease without evidence of
complication.
6. Cholelithiasis.
7. Bilateral femoral head avascular necrosis with associated
secondary osteoarthritis worse on the left than the right.

## 2022-04-18 ENCOUNTER — Encounter: Payer: Self-pay | Admitting: Internal Medicine

## 2022-05-19 ENCOUNTER — Ambulatory Visit: Payer: 59 | Admitting: Internal Medicine

## 2022-05-31 ENCOUNTER — Other Ambulatory Visit: Payer: Self-pay | Admitting: Gastroenterology

## 2022-05-31 DIAGNOSIS — K5090 Crohn's disease, unspecified, without complications: Secondary | ICD-10-CM

## 2022-07-03 ENCOUNTER — Inpatient Hospital Stay: Admission: RE | Admit: 2022-07-03 | Payer: 59 | Source: Ambulatory Visit

## 2022-07-04 ENCOUNTER — Inpatient Hospital Stay: Admission: RE | Admit: 2022-07-04 | Payer: 59 | Source: Ambulatory Visit

## 2022-07-04 ENCOUNTER — Ambulatory Visit
Admission: RE | Admit: 2022-07-04 | Discharge: 2022-07-04 | Disposition: A | Discharge status: Home / Self Care | Payer: 59 | Source: Ambulatory Visit | Attending: Gastroenterology | Admitting: Gastroenterology

## 2022-07-04 DIAGNOSIS — K5090 Crohn's disease, unspecified, without complications: Secondary | ICD-10-CM

## 2022-07-04 MED ORDER — IOPAMIDOL (ISOVUE-300) INJECTION 61%
100.0000 mL | Freq: Once | INTRAVENOUS | Status: AC | PRN
  Administered 2022-07-04: 100 mL via INTRAVENOUS

## 2022-07-24 ENCOUNTER — Other Ambulatory Visit: Payer: Self-pay | Admitting: Orthopedic Surgery

## 2022-07-28 NOTE — Patient Instructions (Signed)
DUE TO COVID-19 ONLY TWO VISITORS  (aged 67 and older)  ARE ALLOWED TO COME WITH YOU AND STAY IN THE WAITING ROOM ONLY DURING PRE OP AND PROCEDURE.   **NO VISITORS ARE ALLOWED IN THE SHORT STAY AREA OR RECOVERY ROOM!!**  IF YOU WILL BE ADMITTED INTO THE HOSPITAL YOU ARE ALLOWED ONLY FOUR SUPPORT PEOPLE DURING VISITATION HOURS ONLY (7 AM -8PM)   The support person(s) must pass our screening, gel in and out, and wear a mask at all times, including in the patient's room. Patients must also wear a mask when staff or their support person are in the room. Visitors GUEST BADGE MUST BE WORN VISIBLY  One adult visitor may remain with you overnight and MUST be in the room by 8 P.M.     Your procedure is scheduled on: 08/07/22   Report to [facility] Main Entrance    Report to admitting at : 7:30 AM   Call this number if you have problems the morning of surgery [phone]   Do not eat food :After Midnight.   After Midnight you may have the following liquids until : 7:00 AM DAY OF SURGERY  Water Black Coffee (sugar ok, NO MILK/CREAM OR CREAMERS)  Tea (sugar ok, NO MILK/CREAM OR CREAMERS) regular and decaf                             Plain Jell-O (NO RED)                                           Fruit ices (not with fruit pulp, NO RED)                                     Popsicles (NO RED)                                                                  Juice: apple, WHITE grape, WHITE cranberry Sports drinks like Gatorade (NO RED)              Drink G2 drink AT:  7:00 AM the day of surgery.       The day of surgery:  Drink ONE (1) Pre-Surgery Clear Ensure or G2 at AM the morning of surgery. Drink in one sitting. Do not sip.  This drink was given to you during your hospital  pre-op appointment visit. Nothing else to drink after completing the  Pre-Surgery Clear Ensure or G2.          If you have questions, please contact your surgeon's office.   Oral Hygiene is also important  to reduce your risk of infection.                                    Remember - BRUSH YOUR TEETH THE MORNING OF SURGERY WITH YOUR REGULAR TOOTHPASTE   Do NOT smoke after Midnight   Take these medicines the morning of surgery with  A SIP OF WATER: trospium chloride,mesalamine.  DO NOT TAKE ANY ORAL DIABETIC MEDICATIONS DAY OF YOUR SURGERY  Bring CPAP mask and tubing day of surgery.                              You may not have any metal on your body including hair pins, jewelry, and body piercing             Do not wear lotions, powders, perfumes/cologne, or deodorant              Men may shave face and neck.   Do not bring valuables to the hospital. [place].   Contacts, dentures or bridgework may not be worn into surgery.   Bring small overnight bag day of surgery.   DO NOT [place]. PHARMACY WILL DISPENSE MEDICATIONS LISTED ON YOUR MEDICATION LIST TO YOU DURING YOUR ADMISSION [place]!    Patients discharged on the day of surgery will not be allowed to drive home.  Someone NEEDS to stay with you for the first 24 hours after anesthesia.   Special Instructions: Bring a copy of your healthcare power of attorney and living will documents         the day of surgery if you haven't scanned them before.              Please read over the following fact sheets you were given: IF YOU HAVE QUESTIONS ABOUT YOUR PRE-OP INSTRUCTIONS PLEASE CALL [phone]     [facility] - Preparing for Surgery Before surgery, you can play an important role.  Because skin is not sterile, your skin needs to be as free of germs as possible.  You can reduce the number of germs on your skin by washing with CHG (chlorahexidine gluconate) soap before surgery.  CHG is an antiseptic cleaner which kills germs and bonds with the skin to continue killing germs even after washing. Please DO NOT use if you have an allergy to CHG or  antibacterial soaps.  If your skin becomes reddened/irritated stop using the CHG and inform your nurse when you arrive at Short Stay. Do not shave (including legs and underarms) for at least 48 hours prior to the first CHG shower.  You may shave your face/neck. Please follow these instructions carefully:  1.  Shower with CHG Soap the night before surgery and the  morning of Surgery.  2.  If you choose to wash your hair, wash your hair first as usual with your  normal  shampoo.  3.  After you shampoo, rinse your hair and body thoroughly to remove the  shampoo.                           4.  Use CHG as you would any other liquid soap.  You can apply chg directly  to the skin and wash                       Gently with a scrungie or clean washcloth.  5.  Apply the CHG Soap to your body ONLY FROM THE NECK DOWN.   Do not use on face/ open  Wound or open sores. Avoid contact with eyes, ears mouth and genitals (private parts).                       Wash face,  Genitals (private parts) with your normal soap.             6.  Wash thoroughly, paying special attention to the area where your surgery  will be performed.  7.  Thoroughly rinse your body with warm water from the neck down.  8.  DO NOT shower/wash with your normal soap after using and rinsing off  the CHG Soap.                9.  Pat yourself dry with a clean towel.            10.  Wear clean pajamas.            11.  Place clean sheets on your bed the night of your first shower and do not  sleep with pets. Day of Surgery : Do not apply any lotions/deodorants the morning of surgery.  Please wear clean clothes to the hospital/surgery center.  FAILURE TO FOLLOW THESE INSTRUCTIONS MAY RESULT IN THE CANCELLATION OF YOUR SURGERY PATIENT SIGNATURE_________________________________  NURSE SIGNATURE__________________________________  ________________________________________________________________________   Adam Phenix  An incentive spirometer is a tool that can help keep your lungs clear and active. This tool measures how well you are filling your lungs with each breath. Taking long deep breaths may help reverse or decrease the chance of developing breathing (pulmonary) problems (especially infection) following: A long period of time when you are unable to move or be active. BEFORE THE PROCEDURE  If the spirometer includes an indicator to show your best effort, your nurse or respiratory therapist will set it to a desired goal. If possible, sit up straight or lean slightly forward. Try not to slouch. Hold the incentive spirometer in an upright position. INSTRUCTIONS FOR USE  Sit on the edge of your bed if possible, or sit up as far as you can in bed or on a chair. Hold the incentive spirometer in an upright position. Breathe out normally. Place the mouthpiece in your mouth and seal your lips tightly around it. Breathe in slowly and as deeply as possible, raising the piston or the ball toward the top of the column. Hold your breath for 3-5 seconds or for as long as possible. Allow the piston or ball to fall to the bottom of the column. Remove the mouthpiece from your mouth and breathe out normally. Rest for a few seconds and repeat Steps 1 through 7 at least 10 times every 1-2 hours when you are awake. Take your time and take a few normal breaths between deep breaths. The spirometer may include an indicator to show your best effort. Use the indicator as a goal to work toward during each repetition. After each set of 10 deep breaths, practice coughing to be sure your lungs are clear. If you have an incision (the cut made at the time of surgery), support your incision when coughing by placing a pillow or rolled up towels firmly against it. Once you are able to get out of bed, walk around indoors and cough well. You may stop using the incentive spirometer when instructed by your caregiver.  RISKS AND  COMPLICATIONS Take your time so you do not get dizzy or light-headed. If you are in pain, you may need to take or  ask for pain medication before doing incentive spirometry. It is harder to take a deep breath if you are having pain. AFTER USE Rest and breathe slowly and easily. It can be helpful to keep track of a log of your progress. Your caregiver can provide you with a simple table to help with this. If you are using the spirometer at home, follow these instructions: [place] IF:  You are having difficultly using the spirometer. You have trouble using the spirometer as often as instructed. Your pain medication is not giving enough relief while using the spirometer. You develop fever of 100.5 F (38.1 C) or higher. SEEK IMMEDIATE MEDICAL CARE IF:  You cough up bloody sputum that had not been present before. You develop fever of 102 F (38.9 C) or greater. You develop worsening pain at or near the incision site. MAKE SURE YOU:  Understand these instructions. Will watch your condition. Will get help right away if you are not doing well or get worse. Document Released: 03/12/2007 Document Revised: 01/22/2012 Document Reviewed: 05/13/2007 [facility] Patient Information 2014 [place], [place].   ________________________________________________________________________

## 2022-07-31 ENCOUNTER — Encounter (HOSPITAL_COMMUNITY): Payer: Self-pay

## 2022-07-31 ENCOUNTER — Other Ambulatory Visit: Payer: Self-pay

## 2022-07-31 ENCOUNTER — Encounter (HOSPITAL_COMMUNITY)
Admission: RE | Admit: 2022-07-31 | Discharge: 2022-07-31 | Disposition: A | Discharge status: Home / Self Care | Payer: 59 | Source: Ambulatory Visit | Attending: Orthopedic Surgery | Admitting: Orthopedic Surgery

## 2022-07-31 ENCOUNTER — Ambulatory Visit (HOSPITAL_COMMUNITY)
Admission: RE | Admit: 2022-07-31 | Discharge: 2022-07-31 | Disposition: A | Discharge status: Home / Self Care | Payer: 59 | Source: Ambulatory Visit | Attending: Orthopedic Surgery | Admitting: Orthopedic Surgery

## 2022-07-31 VITALS — BP 153/90 | HR 68 | Temp 98.1°F | Ht 66.0 in | Wt 158.0 lb

## 2022-07-31 DIAGNOSIS — E119 Type 2 diabetes mellitus without complications: Secondary | ICD-10-CM | POA: Diagnosis not present

## 2022-07-31 DIAGNOSIS — Z01818 Encounter for other preprocedural examination: Secondary | ICD-10-CM | POA: Insufficient documentation

## 2022-07-31 HISTORY — DX: Unspecified osteoarthritis, unspecified site: M19.90

## 2022-07-31 LAB — BASIC METABOLIC PANEL
Anion gap: 5 (ref 5–15)
BUN: 15 mg/dL (ref 8–23)
CO2: 27 mmol/L (ref 22–32)
Calcium: 8.8 mg/dL — ABNORMAL LOW (ref 8.9–10.3)
Chloride: 110 mmol/L (ref 98–111)
Creatinine, Ser: 0.93 mg/dL (ref 0.61–1.24)
GFR, Estimated: >60 mL/min (ref >60)
Glucose, Bld: 106 mg/dL — ABNORMAL HIGH (ref 70–99)
Potassium: 3.9 mmol/L (ref 3.5–5.1)
Sodium: 142 mmol/L (ref 135–145)

## 2022-07-31 LAB — CBC
HCT: 40.7 % (ref 39.0–52.0)
Hemoglobin: 12.6 g/dL — ABNORMAL LOW (ref 13.0–17.0)
MCH: 28.1 pg (ref 26.0–34.0)
MCHC: 31 g/dL (ref 30.0–36.0)
MCV: 90.8 fL (ref 80.0–100.0)
Platelets: 185 10*3/uL (ref 150–400)
RBC: 4.48 MIL/uL (ref 4.22–5.81)
RDW: 14 % (ref 11.5–15.5)
WBC: 7.3 10*3/uL (ref 4.0–10.5)
nRBC: 0 % (ref 0.0–0.2)

## 2022-07-31 LAB — HEMOGLOBIN A1C
Hgb A1c MFr Bld: 6.3 % — ABNORMAL HIGH (ref 4.8–5.6)
Mean Plasma Glucose: 134.11 mg/dL

## 2022-07-31 LAB — GLUCOSE, CAPILLARY: Glucose-Capillary: 103 mg/dL — ABNORMAL HIGH (ref 70–99)

## 2022-07-31 LAB — SURGICAL PCR SCREEN
MRSA, PCR: NEGATIVE
Staphylococcus aureus: NEGATIVE

## 2022-07-31 NOTE — Progress Notes (Signed)
For Short Stay: [place] appointment date: Date of COVID positive in last 22 days:  Bowel Prep reminder:   For Anesthesia: PCP - [facility] Cardiologist -   Chest x-ray -  EKG -  Stress Test -  ECHO -  Cardiac Cath -  Pacemaker/ICD device last checked: Pacemaker orders received: Device Rep notified:  Spinal Cord Stimulator:  Sleep Study -  CPAP -   Fasting Blood Sugar - N/A Checks Blood Sugar ___0__ times a day Date and result of last Hgb A1c-  Blood Thinner Instructions: Aspirin Instructions: Last Dose:  Activity level: Can go up a flight of stairs and activities of daily living without stopping and without chest pain and/or shortness of breath   Able to exercise without chest pain and/or shortness of breath   Unable to go up a flight of stairs without chest pain and/or shortness of breath     Anesthesia review:   Patient denies shortness of breath, fever, cough and chest pain at PAT appointment   Patient verbalized understanding of instructions that were given to them at the PAT appointment. Patient was also instructed that they will need to review over the PAT instructions again at home before surgery.

## 2022-08-07 ENCOUNTER — Other Ambulatory Visit: Payer: Self-pay

## 2022-08-07 ENCOUNTER — Inpatient Hospital Stay (HOSPITAL_COMMUNITY): Payer: 59 | Admitting: Physician Assistant

## 2022-08-07 ENCOUNTER — Encounter (HOSPITAL_COMMUNITY): Payer: Self-pay | Admitting: Orthopedic Surgery

## 2022-08-07 ENCOUNTER — Inpatient Hospital Stay (HOSPITAL_BASED_OUTPATIENT_CLINIC_OR_DEPARTMENT_OTHER): Payer: 59 | Admitting: Certified Registered"

## 2022-08-07 ENCOUNTER — Inpatient Hospital Stay (HOSPITAL_COMMUNITY): Payer: 59

## 2022-08-07 ENCOUNTER — Encounter (HOSPITAL_COMMUNITY)
Admission: RE | Disposition: A | Discharge status: Home Health | Payer: Self-pay | Source: Ambulatory Visit | Attending: Orthopedic Surgery

## 2022-08-07 ENCOUNTER — Inpatient Hospital Stay (HOSPITAL_COMMUNITY)
Admission: RE | Admit: 2022-08-07 | Discharge: 2022-08-08 | DRG: 470 | Disposition: A | Discharge status: Home Health | Payer: 59 | Source: Ambulatory Visit | Attending: Orthopedic Surgery | Admitting: Orthopedic Surgery

## 2022-08-07 DIAGNOSIS — Z472 Encounter for removal of internal fixation device: Secondary | ICD-10-CM | POA: Diagnosis not present

## 2022-08-07 DIAGNOSIS — Z79899 Other long term (current) drug therapy: Secondary | ICD-10-CM | POA: Diagnosis not present

## 2022-08-07 DIAGNOSIS — E119 Type 2 diabetes mellitus without complications: Secondary | ICD-10-CM | POA: Diagnosis present

## 2022-08-07 DIAGNOSIS — R066 Hiccough: Secondary | ICD-10-CM | POA: Diagnosis present

## 2022-08-07 DIAGNOSIS — M1612 Unilateral primary osteoarthritis, left hip: Principal | ICD-10-CM | POA: Diagnosis present

## 2022-08-07 DIAGNOSIS — Z7984 Long term (current) use of oral hypoglycemic drugs: Secondary | ICD-10-CM

## 2022-08-07 DIAGNOSIS — Z87891 Personal history of nicotine dependence: Secondary | ICD-10-CM | POA: Diagnosis not present

## 2022-08-07 HISTORY — PX: HARDWARE REMOVAL: SHX979

## 2022-08-07 HISTORY — PX: TOTAL HIP ARTHROPLASTY: SHX124

## 2022-08-07 LAB — TYPE AND SCREEN
ABO/RH(D): O POS
Antibody Screen: NEGATIVE

## 2022-08-07 LAB — GLUCOSE, CAPILLARY
Glucose-Capillary: 105 mg/dL — ABNORMAL HIGH (ref 70–99)
Glucose-Capillary: 128 mg/dL — ABNORMAL HIGH (ref 70–99)

## 2022-08-07 LAB — ABO/RH: ABO/RH(D): O POS

## 2022-08-07 SURGERY — ARTHROPLASTY, HIP, TOTAL, ANTERIOR APPROACH
Anesthesia: General | Site: Hip | Laterality: Left

## 2022-08-07 MED ORDER — TAMSULOSIN HCL 0.4 MG PO CAPS
0.4000 mg | ORAL_CAPSULE | Freq: Every day | ORAL | Status: DC
  Administered 2022-08-07: 0.4 mg via ORAL
  Filled 2022-08-07: qty 1

## 2022-08-07 MED ORDER — MIDAZOLAM HCL 2 MG/2ML IJ SOLN
INTRAMUSCULAR | Status: AC
  Filled 2022-08-07: qty 2

## 2022-08-07 MED ORDER — MAGNESIUM CITRATE PO SOLN: 1.0000 | Freq: Once | ORAL | Status: DC | PRN

## 2022-08-07 MED ORDER — ZOLPIDEM TARTRATE 5 MG PO TABS: 5.0000 mg | ORAL_TABLET | Freq: Every evening | ORAL | Status: DC | PRN

## 2022-08-07 MED ORDER — METHOCARBAMOL 500 MG PO TABS
500.0000 mg | ORAL_TABLET | Freq: Four times a day (QID) | ORAL | Status: DC | PRN
  Administered 2022-08-07 – 2022-08-08 (×3): 500 mg via ORAL
  Filled 2022-08-07 (×4): qty 1

## 2022-08-07 MED ORDER — ALUM & MAG HYDROXIDE-SIMETH 200-200-20 MG/5ML PO SUSP
30.0000 mL | ORAL | Status: DC | PRN
  Filled 2022-08-07: qty 30

## 2022-08-07 MED ORDER — BUPIVACAINE-EPINEPHRINE 0.25% -1:200000 IJ SOLN
INTRAMUSCULAR | Status: DC | PRN
  Administered 2022-08-07: 30 mL

## 2022-08-07 MED ORDER — OXYCODONE HCL 5 MG/5ML PO SOLN: 5.0000 mg | Freq: Once | ORAL | Status: DC | PRN

## 2022-08-07 MED ORDER — WATER FOR IRRIGATION, STERILE IR SOLN
Status: DC | PRN
  Administered 2022-08-07: 2000 mL

## 2022-08-07 MED ORDER — ORAL CARE MOUTH RINSE: 15.0000 mL | Freq: Once | OROMUCOSAL | Status: AC

## 2022-08-07 MED ORDER — ACETAMINOPHEN 325 MG PO TABS
325.0000 mg | ORAL_TABLET | Freq: Four times a day (QID) | ORAL | Status: DC | PRN
  Administered 2022-08-07 – 2022-08-08 (×2): 650 mg via ORAL
  Filled 2022-08-07 (×2): qty 2

## 2022-08-07 MED ORDER — BISACODYL 5 MG PO TBEC: 5.0000 mg | DELAYED_RELEASE_TABLET | Freq: Every day | ORAL | Status: DC | PRN

## 2022-08-07 MED ORDER — ONDANSETRON HCL 4 MG PO TABS: 4.0000 mg | ORAL_TABLET | Freq: Four times a day (QID) | ORAL | Status: DC | PRN

## 2022-08-07 MED ORDER — HYDROMORPHONE HCL 1 MG/ML IJ SOLN
0.5000 mg | INTRAMUSCULAR | Status: DC | PRN
  Administered 2022-08-07 – 2022-08-08 (×2): 1 mg via INTRAVENOUS
  Filled 2022-08-07 (×2): qty 1

## 2022-08-07 MED ORDER — CEFAZOLIN SODIUM-DEXTROSE 2-4 GM/100ML-% IV SOLN
INTRAVENOUS | Status: AC
  Filled 2022-08-07: qty 100

## 2022-08-07 MED ORDER — DOCUSATE SODIUM 100 MG PO CAPS: 100.0000 mg | ORAL_CAPSULE | Freq: Two times a day (BID) | ORAL | 0 refills | Status: DC

## 2022-08-07 MED ORDER — DEXAMETHASONE SODIUM PHOSPHATE 10 MG/ML IJ SOLN
INTRAMUSCULAR | Status: AC
  Filled 2022-08-07: qty 1

## 2022-08-07 MED ORDER — METFORMIN HCL 500 MG PO TABS
500.0000 mg | ORAL_TABLET | Freq: Two times a day (BID) | ORAL | Status: DC
  Administered 2022-08-08: 500 mg via ORAL
  Filled 2022-08-07: qty 1

## 2022-08-07 MED ORDER — TRANEXAMIC ACID-NACL 1000-0.7 MG/100ML-% IV SOLN
1000.0000 mg | INTRAVENOUS | Status: AC
  Administered 2022-08-07: 1000 mg via INTRAVENOUS

## 2022-08-07 MED ORDER — CHLORHEXIDINE GLUCONATE 0.12 % MT SOLN
15.0000 mL | Freq: Once | OROMUCOSAL | Status: AC
  Administered 2022-08-07: 15 mL via OROMUCOSAL

## 2022-08-07 MED ORDER — ONDANSETRON HCL 4 MG/2ML IJ SOLN
4.0000 mg | Freq: Four times a day (QID) | INTRAMUSCULAR | Status: DC | PRN
  Administered 2022-08-07: 4 mg via INTRAVENOUS
  Filled 2022-08-07: qty 2

## 2022-08-07 MED ORDER — OXYCODONE HCL 5 MG PO TABS: 5.0000 mg | ORAL_TABLET | Freq: Once | ORAL | Status: DC | PRN

## 2022-08-07 MED ORDER — BUPIVACAINE LIPOSOME 1.3 % IJ SUSP
INTRAMUSCULAR | Status: DC | PRN
  Administered 2022-08-07: 10 mL

## 2022-08-07 MED ORDER — MESALAMINE 1.2 G PO TBEC
2.4000 g | DELAYED_RELEASE_TABLET | Freq: Two times a day (BID) | ORAL | Status: DC
  Administered 2022-08-07 – 2022-08-08 (×2): 2.4 g via ORAL
  Filled 2022-08-07 (×3): qty 2

## 2022-08-07 MED ORDER — HYDROMORPHONE HCL 1 MG/ML IJ SOLN
0.2500 mg | INTRAMUSCULAR | Status: DC | PRN
  Administered 2022-08-07 (×2): 0.5 mg via INTRAVENOUS

## 2022-08-07 MED ORDER — ASPIRIN 325 MG PO TBEC: 325.0000 mg | DELAYED_RELEASE_TABLET | Freq: Two times a day (BID) | ORAL | 0 refills | Status: DC

## 2022-08-07 MED ORDER — ASPIRIN 325 MG PO TBEC
325.0000 mg | DELAYED_RELEASE_TABLET | Freq: Two times a day (BID) | ORAL | Status: DC
  Administered 2022-08-08: 325 mg via ORAL
  Filled 2022-08-07: qty 1

## 2022-08-07 MED ORDER — TROSPIUM CHLORIDE ER 60 MG PO CP24: 1.0000 | ORAL_CAPSULE | Freq: Every day | ORAL | Status: DC

## 2022-08-07 MED ORDER — PROPOFOL 1000 MG/100ML IV EMUL
INTRAVENOUS | Status: AC
  Filled 2022-08-07: qty 100

## 2022-08-07 MED ORDER — BUPIVACAINE-EPINEPHRINE (PF) 0.25% -1:200000 IJ SOLN
INTRAMUSCULAR | Status: AC
  Filled 2022-08-07: qty 30

## 2022-08-07 MED ORDER — TRANEXAMIC ACID-NACL 1000-0.7 MG/100ML-% IV SOLN
INTRAVENOUS | Status: AC
  Filled 2022-08-07: qty 100

## 2022-08-07 MED ORDER — ONDANSETRON HCL 4 MG/2ML IJ SOLN
INTRAMUSCULAR | Status: DC | PRN
  Administered 2022-08-07: 4 mg via INTRAVENOUS

## 2022-08-07 MED ORDER — MIDAZOLAM HCL 5 MG/5ML IJ SOLN
INTRAMUSCULAR | Status: DC | PRN
  Administered 2022-08-07: 2 mg via INTRAVENOUS

## 2022-08-07 MED ORDER — POLYETHYLENE GLYCOL 3350 17 G PO PACK: 17.0000 g | PACK | Freq: Every day | ORAL | Status: DC | PRN

## 2022-08-07 MED ORDER — OXYCODONE-ACETAMINOPHEN 5-325 MG PO TABS: 1.0000 | ORAL_TABLET | Freq: Four times a day (QID) | ORAL | 0 refills | Status: DC | PRN

## 2022-08-07 MED ORDER — TRAMADOL HCL 50 MG PO TABS
50.0000 mg | ORAL_TABLET | Freq: Four times a day (QID) | ORAL | Status: DC
  Administered 2022-08-07 – 2022-08-08 (×2): 50 mg via ORAL
  Filled 2022-08-07 (×3): qty 1

## 2022-08-07 MED ORDER — DIPHENHYDRAMINE HCL 12.5 MG/5ML PO ELIX: 12.5000 mg | ORAL_SOLUTION | ORAL | Status: DC | PRN

## 2022-08-07 MED ORDER — LACTATED RINGERS IV SOLN: INTRAVENOUS | Status: DC

## 2022-08-07 MED ORDER — DOCUSATE SODIUM 100 MG PO CAPS
100.0000 mg | ORAL_CAPSULE | Freq: Two times a day (BID) | ORAL | Status: DC
  Administered 2022-08-07 – 2022-08-08 (×2): 100 mg via ORAL
  Filled 2022-08-07 (×2): qty 1

## 2022-08-07 MED ORDER — INSULIN ASPART 100 UNIT/ML IJ SOLN
0.0000 [IU] | Freq: Three times a day (TID) | INTRAMUSCULAR | Status: DC
  Administered 2022-08-07: 5 [IU] via SUBCUTANEOUS
  Administered 2022-08-08: 2 [IU] via SUBCUTANEOUS
  Administered 2022-08-08: 3 [IU] via SUBCUTANEOUS

## 2022-08-07 MED ORDER — CEFAZOLIN SODIUM-DEXTROSE 2-4 GM/100ML-% IV SOLN
2.0000 g | Freq: Four times a day (QID) | INTRAVENOUS | Status: AC
  Administered 2022-08-07 – 2022-08-08 (×2): 2 g via INTRAVENOUS
  Filled 2022-08-07 (×2): qty 100

## 2022-08-07 MED ORDER — TRANEXAMIC ACID-NACL 1000-0.7 MG/100ML-% IV SOLN
1000.0000 mg | Freq: Once | INTRAVENOUS | Status: AC
  Administered 2022-08-07: 1000 mg via INTRAVENOUS
  Filled 2022-08-07: qty 100

## 2022-08-07 MED ORDER — DEXAMETHASONE SODIUM PHOSPHATE 10 MG/ML IJ SOLN
INTRAMUSCULAR | Status: DC | PRN
  Administered 2022-08-07: 8 mg via INTRAVENOUS

## 2022-08-07 MED ORDER — SODIUM CHLORIDE 0.9 % IR SOLN
Status: DC | PRN
  Administered 2022-08-07: 1000 mL

## 2022-08-07 MED ORDER — CELECOXIB 200 MG PO CAPS: 200.0000 mg | ORAL_CAPSULE | Freq: Two times a day (BID) | ORAL | 0 refills | Status: DC

## 2022-08-07 MED ORDER — HYDROMORPHONE HCL 1 MG/ML IJ SOLN
INTRAMUSCULAR | Status: AC
  Filled 2022-08-07: qty 1

## 2022-08-07 MED ORDER — SODIUM CHLORIDE 0.9 % IV SOLN: INTRAVENOUS | Status: DC

## 2022-08-07 MED ORDER — TIZANIDINE HCL 2 MG PO TABS: 2.0000 mg | ORAL_TABLET | Freq: Three times a day (TID) | ORAL | 0 refills | Status: DC | PRN

## 2022-08-07 MED ORDER — POTASSIUM CITRATE ER 10 MEQ (1080 MG) PO TBCR
40.0000 meq | EXTENDED_RELEASE_TABLET | Freq: Four times a day (QID) | ORAL | Status: DC
  Administered 2022-08-07 – 2022-08-08 (×4): 40 meq via ORAL
  Filled 2022-08-07 (×6): qty 4

## 2022-08-07 MED ORDER — BUPIVACAINE LIPOSOME 1.3 % IJ SUSP: 10.0000 mL | Freq: Once | INTRAMUSCULAR | Status: DC

## 2022-08-07 MED ORDER — BUPIVACAINE IN DEXTROSE 0.75-8.25 % IT SOLN
INTRATHECAL | Status: DC | PRN
  Administered 2022-08-07: 2 mL via INTRATHECAL

## 2022-08-07 MED ORDER — METHOCARBAMOL 1000 MG/10ML IJ SOLN
500.0000 mg | Freq: Four times a day (QID) | INTRAVENOUS | Status: DC | PRN
  Filled 2022-08-07: qty 5

## 2022-08-07 MED ORDER — POVIDONE-IODINE 10 % EX SWAB
2.0000 | Freq: Once | CUTANEOUS | Status: AC
  Administered 2022-08-07: 2 via TOPICAL

## 2022-08-07 MED ORDER — ONDANSETRON HCL 4 MG/2ML IJ SOLN
INTRAMUSCULAR | Status: AC
  Filled 2022-08-07: qty 2

## 2022-08-07 MED ORDER — BUPIVACAINE LIPOSOME 1.3 % IJ SUSP
INTRAMUSCULAR | Status: AC
  Filled 2022-08-07: qty 10

## 2022-08-07 MED ORDER — CEFAZOLIN SODIUM-DEXTROSE 2-4 GM/100ML-% IV SOLN
2.0000 g | INTRAVENOUS | Status: AC
  Administered 2022-08-07: 2 g via INTRAVENOUS

## 2022-08-07 MED ORDER — PROPOFOL 500 MG/50ML IV EMUL
INTRAVENOUS | Status: DC | PRN
  Administered 2022-08-07: 60 ug/kg/min via INTRAVENOUS

## 2022-08-07 MED ORDER — OXYCODONE HCL 5 MG PO TABS
5.0000 mg | ORAL_TABLET | ORAL | Status: DC | PRN
  Administered 2022-08-07 – 2022-08-08 (×5): 10 mg via ORAL
  Filled 2022-08-07 (×6): qty 2

## 2022-08-07 MED ORDER — PHENYLEPHRINE 80 MCG/ML (10ML) SYRINGE FOR IV PUSH (FOR BLOOD PRESSURE SUPPORT)
PREFILLED_SYRINGE | INTRAVENOUS | Status: AC
  Filled 2022-08-07: qty 10

## 2022-08-07 MED ORDER — ONDANSETRON HCL 4 MG/2ML IJ SOLN: 4.0000 mg | Freq: Once | INTRAMUSCULAR | Status: DC | PRN

## 2022-08-07 MED ORDER — PHENYLEPHRINE HCL (PRESSORS) 10 MG/ML IV SOLN
INTRAVENOUS | Status: DC | PRN
  Administered 2022-08-07: 160 ug via INTRAVENOUS

## 2022-08-07 SURGICAL SUPPLY — 50 items
APL SKNCLS STERI-STRIP NONHPOA (GAUZE/BANDAGES/DRESSINGS) ×2
BAG COUNTER SPONGE SURGICOUNT (BAG) IMPLANT
BAG SPEC THK2 15X12 ZIP CLS (MISCELLANEOUS)
BAG SPNG CNTER NS LX DISP (BAG) ×2
BAG ZIPLOCK 12X15 (MISCELLANEOUS) IMPLANT
BENZOIN TINCTURE PRP APPL 2/3 (GAUZE/BANDAGES/DRESSINGS) IMPLANT
BLADE SAW SGTL 18X1.27X75 (BLADE) ×2 IMPLANT
BLADE SURG SZ10 CARB STEEL (BLADE) ×4 IMPLANT
CLSR STERI-STRIP ANTIMIC 1/2X4 (GAUZE/BANDAGES/DRESSINGS) IMPLANT
COVER PERINEAL POST (MISCELLANEOUS) ×2 IMPLANT
COVER SURGICAL LIGHT HANDLE (MISCELLANEOUS) ×2 IMPLANT
CUP ACETBLR 52 OD 100 SERIES (Hips) IMPLANT
DRAPE FOOT SWITCH (DRAPES) ×2 IMPLANT
DRAPE STERI IOBAN 125X83 (DRAPES) ×2 IMPLANT
DRAPE U-SHAPE 47X51 STRL (DRAPES) ×4 IMPLANT
DRSG AQUACEL AG ADV 3.5X 6 (GAUZE/BANDAGES/DRESSINGS) ×2 IMPLANT
DRSG AQUACEL AG ADV 3.5X10 (GAUZE/BANDAGES/DRESSINGS) IMPLANT
DURAPREP 26ML APPLICATOR (WOUND CARE) ×2 IMPLANT
ELECT BLADE TIP CTD 4 INCH (ELECTRODE) ×2 IMPLANT
ELECT REM PT RETURN 15FT ADLT (MISCELLANEOUS) ×2 IMPLANT
ELIMINATOR HOLE APEX DEPUY (Hips) IMPLANT
GAUZE XEROFORM 1X8 LF (GAUZE/BANDAGES/DRESSINGS) IMPLANT
GLOVE BIO SURGEON STRL SZ8 (GLOVE) ×2 IMPLANT
GLOVE BIOGEL PI IND STRL 8 (GLOVE) ×4 IMPLANT
GLOVE ECLIPSE 7.5 STRL STRAW (GLOVE) ×4 IMPLANT
GOWN STRL REUS W/ TWL XL LVL3 (GOWN DISPOSABLE) ×4 IMPLANT
GOWN STRL REUS W/TWL XL LVL3 (GOWN DISPOSABLE) ×4
HEAD CERAMIC DELTA 36 PLUS 1.5 (Hips) IMPLANT
HOLDER FOLEY CATH W/STRAP (MISCELLANEOUS) ×2 IMPLANT
HOOD PEEL AWAY FLYTE STAYCOOL (MISCELLANEOUS) ×4 IMPLANT
KIT TURNOVER KIT A (KITS) IMPLANT
LINER ACETAB NEUTRAL 36ID 520D (Liner) IMPLANT
MANIFOLD NEPTUNE II (INSTRUMENTS) ×2 IMPLANT
NEEDLE HYPO 22GX1.5 SAFETY (NEEDLE) ×2 IMPLANT
PACK ANTERIOR HIP CUSTOM (KITS) ×2 IMPLANT
PENCIL SMOKE EVACUATOR (MISCELLANEOUS) IMPLANT
PROTECTOR NERVE ULNAR (MISCELLANEOUS) ×2 IMPLANT
SPIKE FLUID TRANSFER (MISCELLANEOUS) ×2 IMPLANT
STAPLER VISISTAT 35W (STAPLE) IMPLANT
STEM FEMORAL SZ6 HIGH ACTIS (Stem) IMPLANT
STRIP CLOSURE SKIN 1/2X4 (GAUZE/BANDAGES/DRESSINGS) IMPLANT
SUT ETHIBOND NAB CT1 #1 30IN (SUTURE) ×4 IMPLANT
SUT MNCRL AB 3-0 PS2 18 (SUTURE) IMPLANT
SUT VIC AB 0 CT1 36 (SUTURE) ×2 IMPLANT
SUT VIC AB 1 CT1 36 (SUTURE) ×2 IMPLANT
SUT VIC AB 2-0 CT1 27 (SUTURE) ×2
SUT VIC AB 2-0 CT1 TAPERPNT 27 (SUTURE) ×2 IMPLANT
SUT VIC AB 3-0 CT1 27 (SUTURE)
SUT VIC AB 3-0 CT1 TAPERPNT 27 (SUTURE) IMPLANT
TRAY FOLEY MTR SLVR 16FR STAT (SET/KITS/TRAYS/PACK) ×2 IMPLANT

## 2022-08-07 NOTE — Discharge Instructions (Signed)

## 2022-08-07 NOTE — Op Note (Addendum)
PATIENT ID:      [name]  MRN:     [id number] DOB/AGE:    1956/02/19 / 66 y.o.       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   Weight as of 07/31/22: 71.7 kg.     POSTOPERATIVE DIAGNOSIS:  LEFT HIP DEGENERATIVE JOINT DISEASE STATUS POST                                                            PROCEDURE:  1. left total hip arthroplasty in setting of prior hip surgery using a 52 mm DePuy Pinnacle  Cup, [employer],  neutral liner, a +1.5 36 mm ceramic head,  and a #6  Actis stem, 2.hardware removal 3.interpretation of multiple intraoperative fluoroscopic images   SURGEON: Alta Corning    ASSISTANT:   Caesar Bookman OPA-C  (present throughout entire procedure and necessary for timely completion of the procedure)  ANESTHESIA: spinal  BLOOD LOSS: 550 Tranexamic Acid: 1 gram IV DRAINS: None COMPLICATIONS: None    NDICATIONS FOR PROCEDURE:Patient with end-stage arthritis of the left hip.  X-rays show bone-on-bone arthritic changes. Despite conservative measures with observation, anti-inflammatory medicine, narcotics, use of a cane, has severe unremitting pain and can ambulate only less than 1 block before resting. 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 Patient desires elective right total hip arthroplasty to decrease pain and increase function. The risks, benefits, and alternatives were discussed at length including but not limited to the risks of infection, bleeding, nerve injury, stiffness, blood clots, the need for revision surgery, cardiopulmonary complications, among others, and they were willing to proceed.Benefits have been discussed. Questions answered.     PROCEDURE  IN DETAIL: The patient was identified by armband,  received preoperative IV antibiotics in the holding area at [facility], taken to the operating room , appropriate anesthetic monitors  were attached and spinal anesthesia was induced.  The patient was placed onto the hot bed and all bony prominences were well-padded.The right hip was prepped and draped for an anterior approach to the hip.  An incision was made and the subcutaneous dissection was down to the level of the tensor fascia.  The fascia was opened and finger dissected.  The bleeders coming across the anterior portion of the hip were identified and cauterized. Retractors were put in place above and below the femoral neck.  The capsule was opened and tagged and a provisional neck cut was made.  The head was removed and sized on the back table.  The acetabulum was sequentially reamed to a level of 51 mm and a 52 mm porous-coated pinnacle cup was hammered into place with 45 of lateral opening and 30  of anteversion.fluoroscopy was used to ensure this position of the cup.  Attention was turned towards the femur where the leg was actually rotated, extended, and adduction did.  The patient had a previous vascularized free fibula and you could see the remnant of the old fibular shaft within the canal.  I used a rondure or and careful dissection around the fibular graft with a cookie cutter and ultimately continuing to move portions of the old free fibula.  Once the fibula had been removed I used the introducing broach and at that point I was able to see the 6.2 K wire in the canal.  I used a strong needle driver and was able to get a hold of the K wire and then interesting it was able to twist it free from both cortices.  The K wire was removed in total.  Fluoroscopy was brought in at this point to make sure that that initial introductory broach was in the center of the canal that appeared to be in the center canal in both AP and lateral imaging.   The femur was sequentially broached until a size of 6 broach gave a perfect fit and fill.at this point a  1.5 mm delta ceramic hip ball was placed and the hip reduced.  Fluoroscopic images were taken to assess the leg length, fit and fill of the stem, and cup position.  There was approximately a 3 to 5 mm leg length discrepancy at this point given the breech of the lateral femur and some remnant of hard bone from the free fibula and the canal I felt that continuing to try to seat the broach lower was potentially problematic.  I decided to except this leg length.  The 6 broach was removed and a final Actis stem with high offset  and a 1.5 mm ceramic hip ball was placed and reduced.  Final images were taken to make certain there were happy with the position at this point.   The capsule was closed with #1 Vicryl suture.  The tensor fascia was closed with 0 Vicryl suture.  The skin was then closed with combination of 0 and 2-0 Vicryl suture.  The top layer was with 3-0 Monocryl suture.  Benzoin and Steri-Strips were applied  and a sterile compressive dressing was applied and the patient taken to recovery room she noted be in satisfactory condition.  Past medical Motion for the procedure was approximately 300 cc.  Of note Caesar Bookman was present for the entire case and assisted by retraction of tissues, manipulation of the leg, and closing the minimize or time.    Alta Corning 05/08/2021, 6:15 PM  Alta Corning 08/07/2022, 1:36 PM   Addendum: The t body of the operative report previously said right hip.  I changed the wording to left hip as this was definitely a left hip preoperative, postoperative, and surgical treatment.  All work was done on the left hip.

## 2022-08-07 NOTE — Anesthesia Postprocedure Evaluation (Signed)
Anesthesia Post Note  Patient: [name]  Procedure(s) Performed: TOTAL HIP ARTHROPLASTY ANTERIOR APPROACH (Left: Hip) HARDWARE REMOVAL (Left)     Patient location during evaluation: PACU Anesthesia Type: Spinal Level of consciousness: awake and alert Pain management: pain level controlled Vital Signs Assessment: post-procedure vital signs reviewed and stable Respiratory status: spontaneous breathing and respiratory function stable Cardiovascular status: blood pressure returned to baseline and stable Postop Assessment: spinal receding and no apparent nausea or vomiting Anesthetic complications: no   No notable events documented.  Last Vitals:  Vitals:   08/07/22 1345 08/07/22 1400  BP: 134/83 138/82  Pulse: 81 78  Resp: 15 13  Temp:  (!) 36.3 C  SpO2: 97% 99%    Last Pain:  Vitals:   08/07/22 1400  TempSrc:   PainSc: [place]

## 2022-08-07 NOTE — H&P (Signed)
TOTAL HIP ADMISSION H&P  Patient is admitted for left total hip arthroplasty.  Subjective:  Chief Complaint: left hip pain  HPI: [name], 66 y.o. male, has a history of pain and functional disability in the left hip(s) due to arthritis and patient has failed non-surgical conservative treatments for greater than 12 weeks to include NSAID's and/or analgesics, corticosteriod injections, viscosupplementation injections, weight reduction as appropriate, and activity modification.  Onset of symptoms was gradual starting 6 years ago with gradually worsening course since that time.The patient noted prior procedures of the hip to include vascularized fibular graft for AVN  on the left hip(s).  Patient currently rates pain in the left hip at 8 out of 10 with activity. Patient has night pain, worsening of pain with activity and weight bearing, trendelenberg gait, and crepitus. Patient has evidence of subchondral cysts, subchondral sclerosis, periarticular osteophytes, joint subluxation, and joint space narrowing by imaging studies. This condition presents safety issues increasing the risk of falls. This patient has had avascular necrosis of the hip, acetabular fracture, hip dysplasia.  There is no current active infection.  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   Crohn's disease ([place])    Diabetes mellitus without complication ([place])    History of aseptic necrosis of bone BILATERAL HIPS   S/P BONE GRAFT   History of kidney stones    History of mycosis fungoides     S/P ileostomy ([place])     Past Surgical History:  Procedure Laterality Date   BONE GRAFT OF LEFT HIP  1986   ASEPTIC NECROSIS   COLONOSCOPY     CYSTO/ BILATERAL RETROGRADE PYELOGRAM/ LEFT URETERAL STONE EXTRACTION / BILATERAL STENT PLACEMENT  10-07-2003   CYSTOSCOPY WITH LITHOLAPAXY  02/12/2012   Procedure: CYSTOSCOPY WITH LITHOLAPAXY;  Surgeon: Claybon Jabs, MD;  Location: [facility];  Service: Urology;  Laterality: N/A;   EXPL. LAP. W/ ENTEROLYSIS, RESECTION INFLAMMATORY MASS RLQ / TAKE-DOWN OF FISTULA WITH ENTEROENTEROSTOMY/ RESECTION WITH THE SMALL BOWEL TO ASCENDING COLON ANASTOMOSIS  10-29-2000   CROHN'S DISEASE W/ ANASTOMOTIC INFLAMMATORY MASS/    10-30-2000 EXPL. LAP. CONTROL POST-OP ABD. BLEEDING   EXPLORATORY LAPAROTOMY/ RESECTION OF ILEOCOLONIC ANASTOMOSIS AND CREATION OF ILEOSTOMY  11-04-2000   ABD. PERFORATION   ILEOSTOMY CLOSURE N/A 07/23/2015   Procedure: Takedown ileostomy and repair of ostomy hernia, extensive entrolysis (2.5 hrs), ileostransverse colon anastomosis; closure of massive ventral hernia with 20 x 30 Strattice mesh;  Surgeon: Johnathan Hausen, MD;  Location[facility];  Service: General;  Laterality: N/A;   IRRIGATION AND DEBRIDEMENT ABSCESS Right 09/09/2014   Procedure: MINOR INCISION AND DRAINAGE OF ABSCESS;  Surgeon: Charlotte Crumb, MD;  Location: [facility];  Service: Orthopedics;  Laterality: Right;  incision and drainage right long finger    IRRIGATION AND DEBRIDEMENT ABSCESS N/A 01/03/2017   Procedure: IRRIGATION AND DEBRIDEMENT ABDOMINAL WALL ABSCESS;  Surgeon: Johnathan Hausen, MD;  Location[facility];  Service: General;  Laterality: N/A;   LAPAROSCOPY N/A 07/23/2015   Procedure: LAPAROSCOPY DIAGNOSTIC;  Surgeon:  Johnathan Hausen, MD;  [facility];  Service: General;  Laterality: N/A;   ORIF ANKLE FRACTURE Left 08/27/2014   Procedure:  open reduction internal fixation left ankle;  Surgeon: Nita Sells, MD;  Location: [facility];   Service: Orthopedics;  Laterality: Left;  Left open reduction internal fixation ankle   RESECTION OF TERMINAL ILEUM  1983   RIGHT URETEROSCOPIC STONE EXTRACITON  04-24-2005  & 12-01-2002    No current facility-administered medications for this encounter.   Current Outpatient Medications  Medication Sig Dispense Refill Last Dose   Cholecalciferol (DIALYVITE VITAMIN D 5000) 125 MCG (5000 UT) capsule Take 5,000 Units by mouth daily.      Cyanocobalamin (B-12) 2500 MCG TABS Take 2,500 mcg by mouth daily.      meloxicam (MOBIC) 15 MG tablet Take 15 mg by mouth daily.      mesalamine (LIALDA) 1.2 g EC tablet Take 2.4 g by mouth 2 (two) times daily.      metFORMIN (GLUCOPHAGE) 500 MG tablet Take 500 mg by mouth 2 (two) times daily.      potassium citrate (UROCIT-K) 10 MEQ (1080 MG) SR tablet Take 40 mEq by mouth 4 (four) times daily.      tadalafil (CIALIS) 20 MG tablet Take 20 mg by mouth daily as needed for erectile dysfunction.      tamsulosin (FLOMAX) 0.4 MG CAPS capsule Take 1 capsule (0.4 mg total) by mouth daily after supper. 30 capsule 0    Trospium Chloride 60 MG CP24 Take 1 capsule by mouth daily.      blood glucose meter kit and supplies Dispense based on patient and insurance preference. Use up to four times daily as directed. (FOR ICD-9 250.00, 250.01). 1 each 0    No Known Allergies  Social History   Tobacco Use   Smoking status: Former    Types: Cigarettes    Quit date: 08/26/1994    Years since quitting: 27.9   Smokeless tobacco: Never  Substance Use Topics   Alcohol use: No    Family History  Problem Relation Age of Onset   Alzheimer's disease Father    Cancer Father    Cancer Sister      Review of Systems ROS: I have reviewed the patient's review of systems thoroughly and there are no positive responses as relates to the HPI.  Objective:  Physical Exam  Vital signs in last 24 hours:   Vitals:   08/07/22 1318 08/07/22 1330  BP: 119/81 120/78  Pulse: 84 81   Resp: (!) 24 13  Temp: 97.6 F (36.4 C)   SpO2: 96% 94%    Well-developed well-nourished patient in no acute distress. Alert and oriented x3 HEENT:within normal limits Cardiac: Regular rate and rhythm Pulmonary: Lungs clear to auscultation Abdomen: Soft and nontender.  Normal active bowel sounds  Musculoskeletal: (Left hip: Painful range of motion.  Limited range of motion.  No instability.  Neurovascular intact distally.)  Labs: Recent Results (from the past 2160 hour(s))  Glucose, capillary     Status: Abnormal   Collection Time: 07/31/22 10:50 AM  Result Value Ref Range   Glucose-Capillary 103 (H) 70 - 99 mg/dL    Comment: Glucose reference range applies only to samples taken after fasting for at least 8 hours.  Type and screen [place]     Status: None   Collection Time: 07/31/22 11:02 AM  Result Value Ref Range   ABO/RH(D) O POS    Antibody  Screen NEG    Sample Expiration 08/14/2022,2359    Extend sample reason      NO TRANSFUSIONS OR PREGNANCY IN THE PAST 3 MONTHS Performed at [facility], [place] [address], [place], [place] [address]   Hemoglobin A1c per protocol     Status: Abnormal   Collection Time: 07/31/22 11:06 AM  Result Value Ref Range   Hgb A1c MFr Bld 6.3 (H) 4.8 - 5.6 %    Comment: (NOTE) Pre diabetes:          5.7%-6.4%  Diabetes:              >6.4%  Glycemic control for   <7.0% adults with diabetes    Mean Plasma Glucose 134.11 mg/dL    Comment: Performed at [facility], [place] [address], [place], [place] [address]  CBC per protocol     Status: Abnormal   Collection Time: 07/31/22 11:06 AM  Result Value Ref Range   WBC 7.3 4.0 - 10.5 K/uL   RBC 4.48 4.22 - 5.81 MIL/uL   Hemoglobin 12.6 (L) 13.0 - 17.0 g/dL   HCT 40.7 39.0 - 52.0 %   MCV 90.8 80.0 - 100.0 fL   MCH 28.1 26.0 - 34.0 pg   MCHC 31.0 30.0 - 36.0 g/dL   RDW 14.0 11.5 - 15.5 %   Platelets 185 150 - 400 K/uL   nRBC 0.0 0.0 - 0.2 %     Comment: Performed at [facility], [place] [address], [place], [place] [address]  Basic metabolic panel per protocol     Status: Abnormal   Collection Time: 07/31/22 11:06 AM  Result Value Ref Range   Sodium 142 135 - 145 mmol/L   Potassium 3.9 3.5 - 5.1 mmol/L   Chloride 110 98 - 111 mmol/L   CO2 27 22 - 32 mmol/L   Glucose, Bld 106 (H) 70 - 99 mg/dL    Comment: Glucose reference range applies only to samples taken after fasting for at least 8 hours.   BUN 15 8 - 23 mg/dL   Creatinine, Ser 0.93 0.61 - 1.24 mg/dL   Calcium 8.8 (L) 8.9 - 10.3 mg/dL   GFR, Estimated >60 >60 mL/min    Comment: (NOTE) Calculated using the CKD-EPI Creatinine Equation (2021)    Anion gap 5 5 - 15    Comment: Performed at [facility], [place] [address] [place], [place] [address]  Surgical pcr screen     Status: None   Collection Time: 07/31/22 11:06 AM   Specimen: Nasal Mucosa; Nasal Swab  Result Value Ref Range   MRSA, PCR NEGATIVE NEGATIVE   Staphylococcus aureus NEGATIVE NEGATIVE    Comment: (NOTE) The Xpert SA Assay (FDA approved for NASAL specimens in patients 59 years of age and older), is one component of a comprehensive surveillance program. It is not intended to diagnose infection nor to guide or monitor treatment. Performed at [facility], [place] [address] [place], [place] [address]      Estimated body mass index is 25.5 kg/m as calculated from the following:   Height as of 07/31/22: _0  (1.676 m).   Weight as of 07/31/22: 71.7 kg.   Imaging Review Plain radiographs demonstrate severe degenerative joint disease of the left hip(s). The bone quality appears to be good for age and reported activity level.  Patient also has evidence of vascularized fibular graft with K wire in place  Assessment/Plan:  End stage arthritis, left hip(s): Left total hip arthroplasty with possible hardware removal.  The patient history, physical  examination, clinical judgement of the provider and imaging studies are consistent with end stage degenerative joint disease of the left hip(s) and total hip arthroplasty is deemed medically necessary. The treatment options including medical management, injection therapy, arthroscopy and arthroplasty were discussed at length. The risks and benefits of total hip arthroplasty were presented and reviewed. The risks due to aseptic loosening, infection, stiffness, dislocation/subluxation,  thromboembolic complications and other imponderables were discussed.  The patient acknowledged the explanation, agreed to proceed with the plan and consent was signed. Patient is being admitted for inpatient treatment for surgery, pain control, PT, OT, prophylactic antibiotics, VTE prophylaxis, progressive ambulation and ADL's and discharge planning.The patient is planning to be discharged home with home health services

## 2022-08-07 NOTE — Evaluation (Signed)
Physical Therapy Evaluation Patient Details Name: [name] MRN: [id number] DOB: January 12, 1956 Today's Date: 08/07/2022  History of Present Illness  66 yo male s/p L THA, AA and hardware removal on 08/07/22. PMH: Crohn's, renal stones, DM  Clinical Impression  Pt is s/p THA resulting in the deficits listed below (see PT Problem List).  Pt amb ` 60' with RW and min assist for balance, intermittent knee buckling on L (pt denies residual numbnessor effects of spinal). Anticipate steady progress, pt is motivated to work with PT.   Pt will benefit from skilled PT to increase their independence and safety with mobility to allow discharge to the venue listed below.         Recommendations for follow up therapy are one component of a multi-disciplinary discharge planning process, led by the attending physician.  Recommendations may be updated based on patient status, additional functional criteria and insurance authorization.  Follow Up Recommendations Follow physician's recommendations for discharge plan and follow up therapies      Assistance Recommended at Discharge Intermittent Supervision/Assistance  Patient can return home with the following  A little help with walking and/or transfers;A little help with bathing/dressing/bathroom;Assist for transportation;Help with stairs or ramp for entrance;Assistance with cooking/housework    Equipment Recommendations None recommended by PT  Recommendations for Other Services       Functional Status Assessment Patient has had a recent decline in their functional status and demonstrates the ability to make significant improvements in function in a reasonable and predictable amount of time.     Precautions / Restrictions Precautions Precautions: Fall Restrictions Weight Bearing Restrictions: No Other Position/Activity Restrictions: WBAT      Mobility  Bed Mobility Overal bed mobility: Needs Assistance Bed Mobility: Supine to Sit      Supine to sit: Min assist, HOB elevated     General bed mobility comments: incr time, assist  with LLE    Transfers Overall transfer level: Needs assistance Equipment used: Rolling walker (2 wheels) Transfers: Sit to/from Stand Sit to Stand: Min assist           General transfer comment: cues for hand placement, assist to rise and steady    Ambulation/Gait Ambulation/Gait assistance: Min assist Gait Distance (Feet): 60 Feet Assistive device: Rolling walker (2 wheels) Gait Pattern/deviations: Step-to pattern, Decreased weight shift to left Gait velocity: decr     General Gait Details: assist for balance, intermittent L knee buckling with min assist to recover, cues  for initial sequence  Stairs            Wheelchair Mobility    Modified Rankin (Stroke Patients Only)       Balance                                             Pertinent Vitals/Pain Pain Assessment Pain Assessment: 0-10 Pain Score: 4  Pain Location: left hip Pain Descriptors / Indicators: Sore Pain Intervention(s): Limited activity within patient's tolerance, Monitored during session, Premedicated before session, Repositioned    Home Living Family/patient expects to be discharged to:: Private residence Living Arrangements: Spouse/significant other Available Help at Discharge: Available 24 hours/day Type of Home: House Home Access: Ramped entrance       Home Layout: Multi-level;Able to live on main level with bedroom/bathroom Home Equipment: Rolling Walker (2 wheels);Kasandra Knudsen - single point      Prior Function  Prior Level of Function : Independent/Modified Independent                     Hand Dominance        Extremity/Trunk Assessment   Upper Extremity Assessment Upper Extremity Assessment: Defer to OT evaluation    Lower Extremity Assessment Lower Extremity Assessment: RLE deficits/detail RLE Deficits / Details: ankle grossly WFL, knee and hip  2+/5       Communication   Communication: No difficulties  Cognition Arousal/Alertness: Awake/alert Behavior During Therapy: WFL for tasks assessed/performed Overall Cognitive Status: Impaired/Different from baseline Area of Impairment: Problem solving                             Problem Solving: Slow processing, Requires verbal cues General Comments: likely d/t pain meds, sleepy        General Comments      Exercises     Assessment/Plan    PT Assessment Patient needs continued PT services  PT Problem List Decreased strength;Decreased mobility;Decreased activity tolerance;Decreased range of motion;Decreased balance;Decreased coordination;Pain       PT Treatment Interventions DME instruction;Gait training;Therapeutic activities;Patient/family education;Functional mobility training;Therapeutic exercise    PT Goals (Current goals can be found in the Care Plan section)  Acute Rehab PT Goals Patient Stated Goal: be abel to go up and down stairs PT Goal Formulation: With patient Time For Goal Achievement: 08/14/22    Frequency 7X/week     Co-evaluation               AM-PAC PT "6 Clicks" Mobility  Outcome Measure Help needed turning from your back to your side while in a flat bed without using bedrails?: A Little Help needed moving from lying on your back to sitting on the side of a flat bed without using bedrails?: A Little Help needed moving to and from a bed to a chair (including a wheelchair)?: A Little Help needed standing up from a chair using your arms (e.g., wheelchair or bedside chair)?: A Little Help needed to walk in hospital room?: A Little Help needed climbing 3-5 steps with a railing? : A Little 6 Click Score: 18    End of Session Equipment Utilized During Treatment: Gait belt Activity Tolerance: Patient tolerated treatment well Patient left: in chair;with call bell/phone within reach;with chair alarm set   PT Visit Diagnosis: Other  abnormalities of gait and mobility (R26.89);Difficulty in walking, not elsewhere classified (R26.2)    Time: 0712-1975 PT Time Calculation (min) (ACUTE ONLY): 24 min   Charges:   PT Evaluation $PT Eval Low Complexity: 1 Low PT Treatments $Gait Training: 8-22 mins        Baxter Flattery, PT  Acute Rehab Dept [facility]) [phone]  [facility] Weekend Pager [facility] only)  [phone]  08/07/2022   [facility] 08/07/2022, 5:16 PM

## 2022-08-07 NOTE — Anesthesia Procedure Notes (Addendum)
Spinal  Patient location during procedure: OR Start time: 08/07/2022 10:51 AM End time: 08/07/2022 10:54 AM Reason for block: surgical anesthesia Staffing Performed: anesthesiologist  Anesthesiologist: Audry Pili, MD Performed by: Audry Pili, MD Authorized by: Audry Pili, MD   Preanesthetic Checklist Completed: patient identified, IV checked, risks and benefits discussed, surgical consent, monitors and equipment checked, pre-op evaluation and timeout performed Spinal Block Patient position: sitting Prep: DuraPrep Patient monitoring: heart rate, cardiac monitor, continuous pulse ox and blood pressure Approach: midline Location: L3-4 Injection technique: single-shot Needle Needle type: Pencan  Needle gauge: 24 G Additional Notes Consent was obtained prior to the procedure with all questions answered and concerns addressed. Risks including, but not limited to, bleeding, infection, nerve damage, paralysis, failed block, inadequate analgesia, allergic reaction, high spinal, itching, and headache were discussed and the patient wished to proceed. Functioning IV was confirmed and monitors were applied. Sterile prep and drape, including hand hygiene, mask, and sterile gloves were used. The patient was positioned and the spine was prepped. The skin was anesthetized with lidocaine. Free flow of clear CSF was obtained prior to injecting local anesthetic into the CSF. The spinal needle aspirated freely following injection. The needle was carefully withdrawn. The patient tolerated the procedure well.   Renold Don, MD

## 2022-08-07 NOTE — Anesthesia Preprocedure Evaluation (Addendum)
Anesthesia Evaluation  Patient identified by MRN, date of birth, ID band Patient awake    Reviewed: Allergy & Precautions, NPO status , Patient's Chart, lab work & pertinent test results  Airway Mallampati: II  TM Distance: >3 FB Neck ROM: Full    Dental  (+) Dental Advisory Given, Teeth Intact   Pulmonary former smoker,    Pulmonary exam normal breath sounds clear to auscultation       Cardiovascular negative cardio ROS Normal cardiovascular exam Rhythm:Regular Rate:Normal  EKG 07/31/22 NSR, Normal EKG   Neuro/Psych negative neurological ROS  negative psych ROS   GI/Hepatic Crohn's disease S/P ileostomy, abdominal abscess and reversal of ileostomy   Endo/Other  diabetes, Well Controlled, Type 2, Oral Hypoglycemic Agents  Renal/GU Renal diseaseHx/o renal calculi  negative genitourinary   Musculoskeletal  (+) Arthritis , Osteoarthritis,  OA left hip Hardware left ankle Hx/o AVN bilateral hips   Abdominal   Peds  Hematology  (+) Blood dyscrasia, anemia ,   Anesthesia Other Findings   Reproductive/Obstetrics ED                             Anesthesia Physical Anesthesia Plan  ASA: 2  Anesthesia Plan: Spinal   Post-op Pain Management: Regional block* and Minimal or no pain anticipated   Induction: Intravenous  PONV Risk Score and Plan: 3 and Treatment may vary due to age or medical condition, Propofol infusion and Ondansetron  Airway Management Planned: Natural Airway, Nasal Cannula and Simple Face Mask  Additional Equipment: None  Intra-op Plan:   Post-operative Plan:   Informed Consent: I have reviewed the patients History and Physical, chart, labs and discussed the procedure including the risks, benefits and alternatives for the proposed anesthesia with the patient or authorized representative who has indicated his/her understanding and acceptance.     Dental advisory  given  Plan Discussed with: CRNA and Anesthesiologist  Anesthesia Plan Comments:        Anesthesia Quick Evaluation

## 2022-08-07 NOTE — Transfer of Care (Signed)
Immediate Anesthesia Transfer of Care Note  Patient: [name]  Procedure(s) Performed: TOTAL HIP ARTHROPLASTY ANTERIOR APPROACH (Left: Hip) HARDWARE REMOVAL (Left)  Patient Location: PACU  Anesthesia Type:Spinal  Level of Consciousness: awake  Airway & Oxygen Therapy: Patient Spontanous Breathing  Post-op Assessment: Report given to RN  Post vital signs: stable  Last Vitals:  Vitals Value Taken Time  BP 119/81 08/07/22 1317  Temp    Pulse 83 08/07/22 1321  Resp 20 08/07/22 1321  SpO2 94 % 08/07/22 1321  Vitals shown include unvalidated device data.  Last Pain:  Vitals:   08/07/22 0816  TempSrc:   PainSc: 5          Complications: No notable events documented.

## 2022-08-08 ENCOUNTER — Encounter (HOSPITAL_COMMUNITY): Payer: Self-pay | Admitting: Orthopedic Surgery

## 2022-08-08 DIAGNOSIS — R066 Hiccough: Secondary | ICD-10-CM

## 2022-08-08 LAB — GLUCOSE, CAPILLARY
Glucose-Capillary: 233 mg/dL — ABNORMAL HIGH (ref 70–99)
Glucose-Capillary: 209 mg/dL — ABNORMAL HIGH (ref 70–99)
Glucose-Capillary: 159 mg/dL — ABNORMAL HIGH (ref 70–99)
Glucose-Capillary: 145 mg/dL — ABNORMAL HIGH (ref 70–99)

## 2022-08-08 MED ORDER — SODIUM CHLORIDE 0.9 % IV SOLN
12.5000 mg | Freq: Once | INTRAVENOUS | Status: AC
  Administered 2022-08-08: 12.5 mg via INTRAVENOUS
  Filled 2022-08-08: qty 0.5

## 2022-08-08 MED ORDER — CHLORPROMAZINE HCL 50 MG PO TABS
50.0000 mg | ORAL_TABLET | ORAL | Status: AC
  Administered 2022-08-08: 50 mg via ORAL
  Filled 2022-08-08: qty 1

## 2022-08-08 MED ORDER — CHLORPROMAZINE HCL 25 MG PO TABS
25.0000 mg | ORAL_TABLET | Freq: Three times a day (TID) | ORAL | Status: DC | PRN
  Administered 2022-08-08: 25 mg via ORAL
  Filled 2022-08-08 (×2): qty 1

## 2022-08-08 NOTE — Progress Notes (Signed)
Subjective: 1 Day Post-Op Procedure(s) (LRB): TOTAL HIP ARTHROPLASTY ANTERIOR APPROACH (Left) HARDWARE REMOVAL (Left) Patient reports pain as mild.  Foley out this morning.  Has not voided yet.  Taking p.o. without difficulty.  Has had persistent intractable hiccups all night.  The on-call doctor prescribed IV Thorazine.  Still hiccuping.  Objective: Vital signs in last 24 hours: Temp:  [97.4 F (36.3 C)-98.3 F (36.8 C)] 97.6 F (36.4 C) (09/26 0612) Pulse Rate:  [68-87] 77 (09/26 0612) Resp:  [13-24] 17 (09/26 0612) BP: (119-144)/(70-94) 139/76 (09/26 0612) SpO2:  [94 %-99 %] 98 % (09/26 0612) Weight:  [71.5 kg] 71.5 kg (09/25 2207)  Intake/Output from previous day: 09/25 0701 - 09/26 0700 In: 3609.6 [P.O.:240; I.V.:3169.6; IV Piggyback:200] Out: 2375 [Urine:1775; Emesis/NG output:300; Blood:300] Intake/Output this shift: No intake/output data recorded.  No results for input(s): "HGB" in the last 72 hours. No results for input(s): "WBC", "RBC", "HCT", "PLT" in the last 72 hours. No results for input(s): "NA", "K", "CL", "CO2", "BUN", "CREATININE", "GLUCOSE", "CALCIUM" in the last 72 hours. No results for input(s): "LABPT", "INR" in the last 72 hours.  Left hip exam: Neurovascular intact Sensation intact distally Intact pulses distally Dorsiflexion/Plantar flexion intact Incision: dressing C/D/I Patient is hiccuping incessantly.  Assessment/Plan: 1 Day Post-Op Procedure(s) (LRB): TOTAL HIP ARTHROPLASTY ANTERIOR APPROACH (Left) HARDWARE REMOVAL (Left) Intractable hiccups. Plan: P.o. Thorazine 25 mg 3 times daily as needed hiccups. Up with therapy Discharge home with home health Weight-bear as tolerated on left hip without hip precautions. Aspirin 325 mg twice daily x1 month postop for DVT prophylaxis. Follow-up with Dr. Berenice Primas in 2 weeks.   Erlene Senters 08/08/2022, 8:17 AM

## 2022-08-08 NOTE — Plan of Care (Signed)
  Problem: Education: Goal: Knowledge of the prescribed therapeutic regimen will improve Outcome: Progressing   Problem: Activity: Goal: Ability to tolerate increased activity will improve Outcome: Progressing   Problem: Pain Management: Goal: Pain level will decrease with appropriate interventions Outcome: Progressing   Problem: Safety: Goal: Ability to remain free from injury will improve Outcome: Progressing   

## 2022-08-08 NOTE — TOC Transition Note (Signed)
Transition of Care [facility]) - CM/SW Discharge Note   Patient Details  Name: [name] MRN: [id number] Date of Birth: 04-28-1956  Transition of Care [facility]) CM/SW Contact:  Lennart Pall, LCSW Phone Number: 08/08/2022, 10:55 AM   Clinical Narrative:    Met with pt and confirming he has received a RW to his room via [facility].  HHPT prearranged with [facility]. No further TOC needs.   Final next level of care: [place] Barriers to Discharge: No Barriers Identified   Patient Goals and CMS Choice Patient states their goals for this hospitalization and ongoing recovery are:: return home      Discharge Placement                       Discharge Plan and Services                DME Arranged: Walker rolling DME Agency: [employer]       HH Arranged: PT [place] Agency: [place]        Social Determinants of Health (SDOH) Interventions     Readmission Risk Interventions    08/08/2022   10:54 AM  Readmission Risk Prevention Plan  Post Dischage Appt Complete  Medication Screening Complete  Transportation Screening Complete

## 2022-08-08 NOTE — Progress Notes (Signed)
   08/08/22 1544  PT Visit Information  Last PT Received On 08/08/22  Assistance Needed   Exercise focused session, pt tol very well. States he is sleepy and ready to go home, hiccups keeping him awake. Pt is ready to d/c with family assist as needed from PT standpoint.   History of Present Illness 66 yo male s/p L THA, AA and hardware removal on 08/07/22. PMH: Crohn's, renal stones, DM  Subjective Data  Patient Stated Goal be able to go up and down stairs  Precautions  Precautions Fall  Restrictions  Other Position/Activity Restrictions WBAT  Pain Assessment  Pain Assessment 0-10  Pain Score 4  Pain Location left hip  Pain Descriptors / Indicators Sore  Pain Intervention(s) Limited activity within patient's tolerance;Monitored during session;Repositioned;RN gave pain meds during session  Cognition  Arousal/Alertness Awake/alert  Behavior During Therapy [facility] for tasks assessed/performed  Overall Cognitive Status Within Functional Limits for tasks assessed  Bed Mobility  General bed mobility comments in recliner and remained in same  Total Joint Exercises  Ankle Circles/Pumps AROM;10 reps;Both  Quad Sets AROM;Both;10 reps  Short Arc Quad AROM;Left;10 reps;AAROM  Heel Slides AAROM;Left;10 reps  Hip ABduction/ADduction AAROM;Left;10 reps  PT - End of Session  Activity Tolerance Patient tolerated treatment well  Patient left in chair;with call bell/phone within reach;with chair alarm set   PT - Assessment/Plan  PT Plan Current plan remains appropriate  PT Visit Diagnosis Other abnormalities of gait and mobility (R26.89);Difficulty in walking, not elsewhere classified (R26.2)  PT Frequency (ACUTE ONLY) 7X/week  Follow Up Recommendations Follow physician's recommendations for discharge plan and follow up therapies  Assistance recommended at discharge Intermittent Supervision/Assistance  Patient can return home with the following A little help with walking and/or transfers;A little  help with bathing/dressing/bathroom;Assist for transportation;Help with stairs or ramp for entrance;Assistance with cooking/housework  PT equipment None recommended by PT  AM-PAC PT "6 Clicks" Mobility Outcome Measure (Version 2)  Help needed turning from your back to your side while in a flat bed without using bedrails? 4  Help needed moving from lying on your back to sitting on the side of a flat bed without using bedrails? 4  Help needed moving to and from a bed to a chair (including a wheelchair)? 3  Help needed standing up from a chair using your arms (e.g., wheelchair or bedside chair)? 3  Help needed to walk in hospital room? 3  Help needed climbing 3-5 steps with a railing?  3  6 Click Score 20  Consider Recommendation of Discharge To: Home with no services  Progressive Mobility  What is the highest level of mobility based on the progressive mobility assessment? Level 5 (Walks with assist in room/hall) - Balance while stepping forward/back and can walk in room with assist - Complete  PT Goal Progression  Progress towards PT goals Progressing toward goals  Acute Rehab PT Goals  PT Goal Formulation With patient  Time For Goal Achievement 08/14/22  PT Time Calculation  PT Start Time (ACUTE ONLY) 1515  PT Stop Time (ACUTE ONLY) 1535  PT Time Calculation (min) (ACUTE ONLY) 20 min  PT General Charges  $$ ACUTE PT VISIT 1 Visit  PT Treatments  $Therapeutic Exercise 8-22 mins

## 2022-08-08 NOTE — Discharge Summary (Signed)
Patient ID: [name] MRN: [id number] DOB/AGE: 11-20-1955 66 y.o.  Admit date: 08/07/2022 Discharge date: 08/08/2022  Admission Diagnoses:  Principal Problem:   Primary osteoarthritis of left hip Active Problems:   Intractable hiccups   Discharge Diagnoses:  Same  Past Medical History:  Diagnosis Date   Arthritis    Bladder stone    Cancer ([name])    mycosis fungoides   Crohn's disease ([name])    Diabetes mellitus without complication ([name])    History of aseptic necrosis of bone BILATERAL HIPS   S/P BONE GRAFT   History of kidney stones    History of mycosis fungoides    S/P ileostomy ([name])     Surgeries: Procedure(s): Left TOTAL HIP ARTHROPLASTY ANTERIOR APPROACH HARDWARE REMOVAL on 08/07/2022   Discharged Condition: Improved  Hospital Course: [name] is an 66 y.o. male who was admitted 08/07/2022 for operative treatment ofPrimary osteoarthritis of left hip. Patient has severe unremitting pain that affects sleep, daily activities, and work/hobbies. After pre-op clearance the patient was taken to the operating room on 08/07/2022 and underwent  Procedure(s): Left TOTAL HIP ARTHROPLASTY ANTERIOR APPROACH HARDWARE REMOVAL.    Patient was given perioperative antibiotics:  Anti-infectives (From admission, onward)    Start     Dose/Rate Route Frequency Ordered Stop   08/07/22 1800  ceFAZolin (ANCEF) IVPB 2g/100 mL premix        2 g 200 mL/hr over 30 Minutes Intravenous Every 6 hours 08/07/22 1428 08/08/22 0052   08/07/22 0815  ceFAZolin (ANCEF) IVPB 2g/100 mL premix        2 g 200 mL/hr over 30 Minutes Intravenous On call to O.R. 08/07/22 0805 08/07/22 1127   08/07/22 0809  ceFAZolin (ANCEF) 2-4 GM/100ML-% IVPB       Note to Pharmacy: Humberto Leep O: cabinet override      08/07/22 0809 08/07/22 1057        Patient was given sequential compression devices, early ambulation, and chemoprophylaxis to prevent DVT.  The patient did have intractable hiccups  which kept him up all night.  He was given IV followed by p.o. Thorazine.  Patient benefited maximally from hospital stay and there were no complications.    Recent vital signs: Patient Vitals for the past 24 hrs:  BP Temp Temp src Pulse Resp SpO2 Height Weight  08/08/22 0612 139/76 97.6 F (36.4 C) Oral 77 17 98 % -- --  08/08/22 0144 131/70 98.3 F (36.8 C) -- 72 17 98 % -- --  08/07/22 2207 132/84 97.6 F (36.4 C) Oral 68 17 97 % _0  (1.676 m) 71.5 kg  08/07/22 1712 (!) 144/94 -- -- 87 16 99 % -- --  08/07/22 1419 137/88 97.8 F (36.6 C) -- 70 -- 98 % -- --  08/07/22 1400 138/82 (!) 97.4 F (36.3 C) -- 78 13 99 % -- --  08/07/22 1345 134/83 -- -- 81 15 97 % -- --  08/07/22 1330 120/78 -- -- 81 13 94 % -- --  08/07/22 1318 119/81 97.6 F (36.4 C) -- 84 (!) 24 96 % -- --     Recent laboratory studies: No results for input(s): "WBC", "HGB", "HCT", "PLT", "NA", "K", "CL", "CO2", "BUN", "CREATININE", "GLUCOSE", "INR", "CALCIUM" in the last 72 hours.  Invalid input(s): "PT", "2"   Discharge Medications:   Allergies as of 08/08/2022   No Known Allergies      Medication List     STOP taking these medications  meloxicam 15 MG tablet Commonly known as: MOBIC       TAKE these medications    aspirin EC 325 MG tablet Take 1 tablet (325 mg total) by mouth 2 (two) times daily after a meal. Take x 1 month post op to decrease risk of blood clots.   B-12 2500 MCG Tabs Take 2,500 mcg by mouth daily.   blood glucose meter kit and supplies Dispense based on patient and insurance preference. Use up to four times daily as directed. (FOR ICD-9 250.00, 250.01).   celecoxib 200 MG capsule Commonly known as: CeleBREX Take 1 capsule (200 mg total) by mouth 2 (two) times daily.   Dialyvite Vitamin D 5000 125 MCG (5000 UT) capsule Generic drug: Cholecalciferol Take 5,000 Units by mouth daily.   docusate sodium 100 MG capsule Commonly known as: Colace Take 1 capsule (100 mg  total) by mouth 2 (two) times daily.   mesalamine 1.2 g EC tablet Commonly known as: LIALDA Take 2.4 g by mouth 2 (two) times daily.   metFORMIN 500 MG tablet Commonly known as: GLUCOPHAGE Take 500 mg by mouth 2 (two) times daily.   oxyCODONE-acetaminophen 5-325 MG tablet Commonly known as: PERCOCET/ROXICET Take 1-2 tablets by mouth every 6 (six) hours as needed for severe pain.   potassium citrate 10 MEQ (1080 MG) SR tablet Commonly known as: UROCIT-K Take 40 mEq by mouth 4 (four) times daily.   tadalafil 20 MG tablet Commonly known as: CIALIS Take 20 mg by mouth daily as needed for erectile dysfunction.   tamsulosin 0.4 MG Caps capsule Commonly known as: FLOMAX Take 1 capsule (0.4 mg total) by mouth daily after supper.   tiZANidine 2 MG tablet Commonly known as: ZANAFLEX Take 1 tablet (2 mg total) by mouth every 8 (eight) hours as needed for muscle spasms.   Trospium Chloride 60 MG Cp24 Take 1 capsule by mouth daily.               Durable Medical Equipment  (From admission, onward)           Start     Ordered   08/07/22 1429  DME Walker rolling  Once       Question:  Patient needs a walker to treat with the following condition  Answer:  Primary osteoarthritis of right hip   08/07/22 1428   08/07/22 1429  DME 3 n 1  Once        08/07/22 1428              Discharge Care Instructions  (From admission, onward)           Start     Ordered   08/08/22 0000  Weight bearing as tolerated       Question Answer Comment  Laterality left   Extremity Lower      08/08/22 0827            Diagnostic Studies: DG HIP UNILAT WITH PELVIS 2-3 VIEWS LEFT  Result Date: 08/07/2022 CLINICAL DATA:  [id number] hip arthroplasty EXAM: DG HIP (WITH OR WITHOUT PELVIS) 2-3V LEFT COMPARISON:  None Available. FINDINGS: Spot fluoroscopy images were obtained for surgical planning purposes. Status post LEFT hip arthroplasty. Orthopedic hardware is intact and without  periprosthetic fracture or lucency. Fluoro time 11.4 seconds. Air kerma: 1.03 mGy IMPRESSION: Spot fluoroscopy images were obtained for surgical planning purposes. Electronically Signed   By: [name] M.D.   On: 08/07/2022 14:43   DG C-Arm 1-60 Min-No Report  Result Date: 08/07/2022 Fluoroscopy was utilized by the requesting physician.  No radiographic interpretation.   DG Chest 2 View  Result Date: 07/31/2022 CLINICAL DATA:  66 year old male with a history preoperative chest x-ray EXAM: CHEST - 2 VIEW COMPARISON:  11/11/2005 FINDINGS: Cardiomediastinal silhouette unchanged in size and contour. No evidence of central vascular congestion. No interlobular septal thickening. No pneumothorax or pleural effusion. Coarsened interstitial markings, with no confluent airspace disease. No acute displaced fracture. Degenerative changes of the spine. IMPRESSION: No active cardiopulmonary disease. Electronically Signed   By: Corrie Mckusick D.O.   On: 07/31/2022 11:32    Disposition: Discharge disposition: 01-Home or Self Care       Discharge Instructions     Call MD / Call 911   Complete by: As directed    If you experience chest pain or shortness of breath, CALL 911 and be transported to the hospital emergency room.  If you develope a fever above 101 F, pus (white drainage) or increased drainage or redness at the wound, or calf pain, call your surgeon's office.   Diet Carb Modified   Complete by: As directed    Increase activity slowly as tolerated   Complete by: As directed    Post-operative opioid taper instructions:   Complete by: As directed    POST-OPERATIVE OPIOID TAPER INSTRUCTIONS: It is important to wean off of your opioid medication as soon as possible. If you do not need pain medication after your surgery it is ok to stop day one. Opioids include: Codeine, Hydrocodone(Norco, Vicodin), Oxycodone(Percocet, oxycontin) and hydromorphone amongst others.  Long term and even short  term use of opiods can cause: Increased pain response Dependence Constipation Depression Respiratory depression And more.  Withdrawal symptoms can include Flu like symptoms Nausea, vomiting And more Techniques to manage these symptoms Hydrate well Eat regular healthy meals Stay active Use relaxation techniques(deep breathing, meditating, yoga) Do Not substitute Alcohol to help with tapering If you have been on opioids for less than two weeks and do not have pain than it is ok to stop all together.  Plan to wean off of opioids This plan should start within one week post op of your joint replacement. Maintain the same interval or time between taking each dose and first decrease the dose.  Cut the total daily intake of opioids by one tablet each day Next start to increase the time between doses. The last dose that should be eliminated is the evening dose.      Weight bearing as tolerated   Complete by: As directed    Laterality: left   Extremity: Lower        Follow-up Information     Dorna Leitz, MD. Schedule an appointment as soon as possible for a visit in 2 week(s).   Specialty: Orthopedic Surgery Contact information: [place] [address] [phone]                  Signed: Erlene Senters 08/08/2022, 8:28 AM

## 2022-08-08 NOTE — Progress Notes (Signed)
Physical Therapy Treatment Patient Details Name: [name] MRN: [id number] DOB: 01-16-1956 Today's Date: 08/08/2022   History of Present Illness 66 yo male s/p L THA, AA and hardware removal on 08/07/22. PMH: Crohn's, renal stones, DM    PT Comments    Pt progressing well, improved gait fluidity and stability. Will see for a second session and pt is hopeful to d/c later today    Recommendations for follow up therapy are one component of a multi-disciplinary discharge planning process, led by the attending physician.  Recommendations may be updated based on patient status, additional functional criteria and insurance authorization.  Follow Up Recommendations  Follow physician's recommendations for discharge plan and follow up therapies     Assistance Recommended at Discharge Intermittent Supervision/Assistance  Patient can return home with the following A little help with walking and/or transfers;A little help with bathing/dressing/bathroom;Assist for transportation;Help with stairs or ramp for entrance;Assistance with cooking/housework   Equipment Recommendations  None recommended by PT    Recommendations for Other Services       Precautions / Restrictions Precautions Precautions: Fall Restrictions Weight Bearing Restrictions: No Other Position/Activity Restrictions: WBAT     Mobility  Bed Mobility Overal bed mobility: Needs Assistance Bed Mobility: Supine to Sit     Supine to sit: Supervision     General bed mobility comments: incr time, no physical assist, supervision for safety    Transfers Overall transfer level: Needs assistance Equipment used: Rolling walker (2 wheels) Transfers: Sit to/from Stand Sit to Stand: Min guard, Supervision           General transfer comment: cues for hand placement, assist to rise and steady    Ambulation/Gait Ambulation/Gait assistance: Min guard, Supervision Gait Distance (Feet): 410 Feet Assistive device: Rolling  walker (2 wheels) Gait Pattern/deviations: Step-to pattern, Decreased weight shift to left       General Gait Details: cues for sequence, progression to step through gait   Stairs             Wheelchair Mobility    Modified Rankin (Stroke Patients Only)       Balance                                            Cognition Arousal/Alertness: Awake/alert Behavior During Therapy: WFL for tasks assessed/performed Overall Cognitive Status: Within Functional Limits for tasks assessed                                          Exercises      General Comments        Pertinent Vitals/Pain Pain Assessment Pain Assessment: 0-10 Pain Score: 4  Pain Location: left hip Pain Descriptors / Indicators: Sore Pain Intervention(s): Limited activity within patient's tolerance, Monitored during session, Repositioned, Premedicated before session    Home Living                          Prior Function            PT Goals (current goals can now be found in the care plan section) Acute Rehab PT Goals Patient Stated Goal: be able to go up and down stairs PT Goal Formulation: With patient Time For Goal Achievement: 08/14/22 Progress towards  PT goals: Progressing toward goals    Frequency    7X/week      PT Plan Current plan remains appropriate    Co-evaluation              AM-PAC PT "6 Clicks" Mobility   Outcome Measure  Help needed turning from your back to your side while in a flat bed without using bedrails?: None Help needed moving from lying on your back to sitting on the side of a flat bed without using bedrails?: None Help needed moving to and from a bed to a chair (including a wheelchair)?: A Little Help needed standing up from a chair using your arms (e.g., wheelchair or bedside chair)?: A Little Help needed to walk in hospital room?: A Little Help needed climbing 3-5 steps with a railing? : A Little 6 Click  Score: 20    End of Session Equipment Utilized During Treatment: Gait belt Activity Tolerance: Patient tolerated treatment well Patient left: in chair;with call bell/phone within reach;with chair alarm set;with nursing/sitter in room   PT Visit Diagnosis: Other abnormalities of gait and mobility (R26.89);Difficulty in walking, not elsewhere classified (R26.2)     Time: 8786-7672 PT Time Calculation (min) (ACUTE ONLY): 20 min  Charges:  $Gait Training: 8-22 mins                     Baxter Flattery, PT  Acute Rehab Dept [facility]) [phone]  [facility] Weekend Pager [facility] only)  [phone]  08/08/2022    [facility] 08/08/2022, 3:10 PM

## 2022-09-09 ENCOUNTER — Encounter (HOSPITAL_COMMUNITY): Payer: Self-pay

## 2022-09-09 ENCOUNTER — Inpatient Hospital Stay (HOSPITAL_COMMUNITY)
Admission: EM | Admit: 2022-09-09 | Discharge: 2022-09-13 | DRG: 394 | Disposition: A | Discharge status: Home / Self Care | Payer: 59 | Attending: Internal Medicine | Admitting: Internal Medicine

## 2022-09-09 ENCOUNTER — Other Ambulatory Visit: Payer: Self-pay

## 2022-09-09 DIAGNOSIS — Z96642 Presence of left artificial hip joint: Secondary | ICD-10-CM | POA: Diagnosis present

## 2022-09-09 DIAGNOSIS — N400 Enlarged prostate without lower urinary tract symptoms: Secondary | ICD-10-CM

## 2022-09-09 DIAGNOSIS — K9419 Other complications of enterostomy: Secondary | ICD-10-CM | POA: Diagnosis present

## 2022-09-09 DIAGNOSIS — Z9049 Acquired absence of other specified parts of digestive tract: Secondary | ICD-10-CM

## 2022-09-09 DIAGNOSIS — Z23 Encounter for immunization: Secondary | ICD-10-CM

## 2022-09-09 DIAGNOSIS — K50013 Crohn's disease of small intestine with fistula: Secondary | ICD-10-CM | POA: Diagnosis not present

## 2022-09-09 DIAGNOSIS — E876 Hypokalemia: Secondary | ICD-10-CM | POA: Diagnosis present

## 2022-09-09 DIAGNOSIS — Y832 Surgical operation with anastomosis, bypass or graft as the cause of abnormal reaction of the patient, or of later complication, without mention of misadventure at the time of the procedure: Secondary | ICD-10-CM | POA: Diagnosis present

## 2022-09-09 DIAGNOSIS — K9189 Other postprocedural complications and disorders of digestive system: Secondary | ICD-10-CM | POA: Diagnosis not present

## 2022-09-09 DIAGNOSIS — N200 Calculus of kidney: Secondary | ICD-10-CM

## 2022-09-09 DIAGNOSIS — M1990 Unspecified osteoarthritis, unspecified site: Secondary | ICD-10-CM | POA: Diagnosis present

## 2022-09-09 DIAGNOSIS — K5090 Crohn's disease, unspecified, without complications: Principal | ICD-10-CM

## 2022-09-09 DIAGNOSIS — N3000 Acute cystitis without hematuria: Secondary | ICD-10-CM

## 2022-09-09 DIAGNOSIS — K5010 Crohn's disease of large intestine without complications: Secondary | ICD-10-CM | POA: Diagnosis present

## 2022-09-09 DIAGNOSIS — E119 Type 2 diabetes mellitus without complications: Secondary | ICD-10-CM

## 2022-09-09 DIAGNOSIS — Z7984 Long term (current) use of oral hypoglycemic drugs: Secondary | ICD-10-CM

## 2022-09-09 DIAGNOSIS — Z87891 Personal history of nicotine dependence: Secondary | ICD-10-CM

## 2022-09-09 DIAGNOSIS — K5900 Constipation, unspecified: Secondary | ICD-10-CM | POA: Diagnosis present

## 2022-09-09 DIAGNOSIS — Z7982 Long term (current) use of aspirin: Secondary | ICD-10-CM

## 2022-09-09 DIAGNOSIS — K130 Diseases of lips: Secondary | ICD-10-CM | POA: Diagnosis present

## 2022-09-09 DIAGNOSIS — Z8739 Personal history of other diseases of the musculoskeletal system and connective tissue: Secondary | ICD-10-CM

## 2022-09-09 DIAGNOSIS — Z79899 Other long term (current) drug therapy: Secondary | ICD-10-CM

## 2022-09-09 LAB — COMPREHENSIVE METABOLIC PANEL
ALT: 10 U/L (ref 0–44)
AST: 19 U/L (ref 15–41)
Albumin: 3.7 g/dL (ref 3.5–5.0)
Alkaline Phosphatase: 89 U/L (ref 38–126)
Anion gap: 14 (ref 5–15)
BUN: 19 mg/dL (ref 8–23)
CO2: 22 mmol/L (ref 22–32)
Calcium: 9.6 mg/dL (ref 8.9–10.3)
Chloride: 105 mmol/L (ref 98–111)
Creatinine, Ser: 1.17 mg/dL (ref 0.61–1.24)
GFR, Estimated: >60 mL/min (ref >60)
Glucose, Bld: 180 mg/dL — ABNORMAL HIGH (ref 70–99)
Potassium: 3.3 mmol/L — ABNORMAL LOW (ref 3.5–5.1)
Sodium: 141 mmol/L (ref 135–145)
Total Bilirubin: 0.6 mg/dL (ref 0.3–1.2)
Total Protein: 7.8 g/dL (ref 6.5–8.1)

## 2022-09-09 LAB — CBC WITH DIFFERENTIAL/PLATELET
Abs Immature Granulocytes: 0.06 10*3/uL (ref 0.00–0.07)
Basophils Absolute: 0 10*3/uL (ref 0.0–0.1)
Basophils Relative: 0 %
Eosinophils Absolute: 0 10*3/uL (ref 0.0–0.5)
Eosinophils Relative: 0 %
HCT: 41.8 % (ref 39.0–52.0)
Hemoglobin: 13 g/dL (ref 13.0–17.0)
Immature Granulocytes: 0 %
Lymphocytes Relative: 5 %
Lymphs Abs: 0.9 10*3/uL (ref 0.7–4.0)
MCH: 26.5 pg (ref 26.0–34.0)
MCHC: 31.1 g/dL (ref 30.0–36.0)
MCV: 85.1 fL (ref 80.0–100.0)
Monocytes Absolute: 1 10*3/uL (ref 0.1–1.0)
Monocytes Relative: 6 %
Neutro Abs: 15.1 10*3/uL — ABNORMAL HIGH (ref 1.7–7.7)
Neutrophils Relative %: 89 %
Platelets: 303 10*3/uL (ref 150–400)
RBC: 4.91 MIL/uL (ref 4.22–5.81)
RDW: 14.9 % (ref 11.5–15.5)
WBC: 17.1 10*3/uL — ABNORMAL HIGH (ref 4.0–10.5)
nRBC: 0 % (ref 0.0–0.2)

## 2022-09-09 LAB — LIPASE, BLOOD: Lipase: 28 U/L (ref 11–51)

## 2022-09-09 NOTE — ED Triage Notes (Signed)
Pt comes via [facility] for generalized abd pain that has been going on for several days, hx of chron, n/v, some abd distension

## 2022-09-09 NOTE — ED Provider Triage Note (Signed)
Emergency Medicine Provider Triage Evaluation Note  [name] , a 66 y.o. male  was evaluated in triage.  Pt complains of several days of nausea and vomiting. He has distended abdomen. Hx of Crohn's but this does not feel like normal flare. He has severe generalized abdominal pain. Multiple intraabdominal surgeries in the past. Patient unable to tell me when last BM was due to yelling out in pain.   Review of Systems  Positive:  Negative:   Physical Exam  BP 136/80   Pulse (!) 107   Temp 97.6 F (36.4 C) (Oral)   Resp 20   SpO2 99%  Gen:   Awake, moaning in pain Resp:  Normal effort  MSK:   Moves extremities without difficulty  Other:  Distended abdomen. Generalized tenderness. No pulsatile mass.   Medical Decision Making  Medically screening exam initiated at 7:49 PM.  Appropriate orders placed.  [name] was informed that the remainder of the evaluation will be completed by another provider, this initial triage assessment does not replace that evaluation, and the importance of remaining in the ED until their evaluation is complete.     [name], [place] 09/09/22 1950

## 2022-09-09 NOTE — Progress Notes (Signed)
PT needs an IV for CT scan.

## 2022-09-10 ENCOUNTER — Emergency Department (HOSPITAL_COMMUNITY): Payer: 59

## 2022-09-10 ENCOUNTER — Encounter (HOSPITAL_COMMUNITY): Payer: Self-pay

## 2022-09-10 DIAGNOSIS — Z79899 Other long term (current) drug therapy: Secondary | ICD-10-CM | POA: Diagnosis not present

## 2022-09-10 DIAGNOSIS — Z9049 Acquired absence of other specified parts of digestive tract: Secondary | ICD-10-CM | POA: Diagnosis not present

## 2022-09-10 DIAGNOSIS — N400 Enlarged prostate without lower urinary tract symptoms: Secondary | ICD-10-CM

## 2022-09-10 DIAGNOSIS — K5090 Crohn's disease, unspecified, without complications: Secondary | ICD-10-CM

## 2022-09-10 DIAGNOSIS — K50013 Crohn's disease of small intestine with fistula: Secondary | ICD-10-CM

## 2022-09-10 DIAGNOSIS — N200 Calculus of kidney: Secondary | ICD-10-CM | POA: Diagnosis present

## 2022-09-10 DIAGNOSIS — Z7984 Long term (current) use of oral hypoglycemic drugs: Secondary | ICD-10-CM | POA: Diagnosis not present

## 2022-09-10 DIAGNOSIS — Z96642 Presence of left artificial hip joint: Secondary | ICD-10-CM | POA: Diagnosis present

## 2022-09-10 DIAGNOSIS — Z7982 Long term (current) use of aspirin: Secondary | ICD-10-CM | POA: Diagnosis not present

## 2022-09-10 DIAGNOSIS — K5010 Crohn's disease of large intestine without complications: Secondary | ICD-10-CM | POA: Diagnosis not present

## 2022-09-10 DIAGNOSIS — Z87891 Personal history of nicotine dependence: Secondary | ICD-10-CM | POA: Diagnosis not present

## 2022-09-10 DIAGNOSIS — Z23 Encounter for immunization: Secondary | ICD-10-CM | POA: Diagnosis not present

## 2022-09-10 DIAGNOSIS — K9189 Other postprocedural complications and disorders of digestive system: Secondary | ICD-10-CM | POA: Diagnosis present

## 2022-09-10 DIAGNOSIS — Z8739 Personal history of other diseases of the musculoskeletal system and connective tissue: Secondary | ICD-10-CM

## 2022-09-10 DIAGNOSIS — Y832 Surgical operation with anastomosis, bypass or graft as the cause of abnormal reaction of the patient, or of later complication, without mention of misadventure at the time of the procedure: Secondary | ICD-10-CM | POA: Diagnosis present

## 2022-09-10 DIAGNOSIS — K130 Diseases of lips: Secondary | ICD-10-CM | POA: Diagnosis present

## 2022-09-10 DIAGNOSIS — K5900 Constipation, unspecified: Secondary | ICD-10-CM | POA: Diagnosis present

## 2022-09-10 DIAGNOSIS — N3000 Acute cystitis without hematuria: Secondary | ICD-10-CM

## 2022-09-10 DIAGNOSIS — E119 Type 2 diabetes mellitus without complications: Secondary | ICD-10-CM | POA: Diagnosis present

## 2022-09-10 DIAGNOSIS — K9419 Other complications of enterostomy: Secondary | ICD-10-CM | POA: Diagnosis present

## 2022-09-10 DIAGNOSIS — E876 Hypokalemia: Secondary | ICD-10-CM | POA: Diagnosis present

## 2022-09-10 DIAGNOSIS — M1990 Unspecified osteoarthritis, unspecified site: Secondary | ICD-10-CM | POA: Diagnosis present

## 2022-09-10 LAB — COMPREHENSIVE METABOLIC PANEL
ALT: 10 U/L (ref 0–44)
AST: 15 U/L (ref 15–41)
Albumin: 3 g/dL — ABNORMAL LOW (ref 3.5–5.0)
Alkaline Phosphatase: 98 U/L (ref 38–126)
Anion gap: 7 (ref 5–15)
BUN: 19 mg/dL (ref 8–23)
CO2: 24 mmol/L (ref 22–32)
Calcium: 7.9 mg/dL — ABNORMAL LOW (ref 8.9–10.3)
Chloride: 108 mmol/L (ref 98–111)
Creatinine, Ser: 0.97 mg/dL (ref 0.61–1.24)
GFR, Estimated: >60 mL/min (ref >60)
Glucose, Bld: 187 mg/dL — ABNORMAL HIGH (ref 70–99)
Potassium: 4.1 mmol/L (ref 3.5–5.1)
Sodium: 139 mmol/L (ref 135–145)
Total Bilirubin: 0.5 mg/dL (ref 0.3–1.2)
Total Protein: 6.7 g/dL (ref 6.5–8.1)

## 2022-09-10 LAB — CBC WITH DIFFERENTIAL/PLATELET
Abs Immature Granulocytes: 0.02 10*3/uL (ref 0.00–0.07)
Basophils Absolute: 0 10*3/uL (ref 0.0–0.1)
Basophils Relative: 0 %
Eosinophils Absolute: 0 10*3/uL (ref 0.0–0.5)
Eosinophils Relative: 0 %
HCT: 36 % — ABNORMAL LOW (ref 39.0–52.0)
Hemoglobin: 10.9 g/dL — ABNORMAL LOW (ref 13.0–17.0)
Immature Granulocytes: 0 %
Lymphocytes Relative: 7 %
Lymphs Abs: 0.4 10*3/uL — ABNORMAL LOW (ref 0.7–4.0)
MCH: 26.5 pg (ref 26.0–34.0)
MCHC: 30.3 g/dL (ref 30.0–36.0)
MCV: 87.6 fL (ref 80.0–100.0)
Monocytes Absolute: 0.1 10*3/uL (ref 0.1–1.0)
Monocytes Relative: 1 %
Neutro Abs: 4.9 10*3/uL (ref 1.7–7.7)
Neutrophils Relative %: 92 %
Platelets: 195 10*3/uL (ref 150–400)
RBC: 4.11 MIL/uL — ABNORMAL LOW (ref 4.22–5.81)
RDW: 15 % (ref 11.5–15.5)
WBC: 5.4 10*3/uL (ref 4.0–10.5)
nRBC: 0 % (ref 0.0–0.2)

## 2022-09-10 LAB — URINALYSIS, ROUTINE W REFLEX MICROSCOPIC
Glucose, UA: NEGATIVE mg/dL
Ketones, ur: 15 mg/dL — AB
Nitrite: POSITIVE — AB
Protein, ur: 30 mg/dL — AB
Specific Gravity, Urine: >1.03 — ABNORMAL HIGH (ref 1.005–1.030)
pH: 5.5 (ref 5.0–8.0)

## 2022-09-10 LAB — URINALYSIS, MICROSCOPIC (REFLEX)
RBC / HPF: NONE SEEN RBC/hpf (ref 0–5)
WBC, UA: >50 WBC/hpf (ref 0–5)

## 2022-09-10 LAB — MAGNESIUM: Magnesium: 1.8 mg/dL (ref 1.7–2.4)

## 2022-09-10 LAB — GLUCOSE, CAPILLARY
Glucose-Capillary: 200 mg/dL — ABNORMAL HIGH (ref 70–99)
Glucose-Capillary: 165 mg/dL — ABNORMAL HIGH (ref 70–99)

## 2022-09-10 LAB — C-REACTIVE PROTEIN: CRP: 7.2 mg/dL — ABNORMAL HIGH (ref <1.0)

## 2022-09-10 LAB — HIV ANTIBODY (ROUTINE TESTING W REFLEX): HIV Screen 4th Generation wRfx: NONREACTIVE

## 2022-09-10 MED ORDER — METHYLPREDNISOLONE SODIUM SUCC 40 MG IJ SOLR
40.0000 mg | Freq: Two times a day (BID) | INTRAMUSCULAR | Status: DC
  Administered 2022-09-10 – 2022-09-11 (×2): 40 mg via INTRAVENOUS
  Filled 2022-09-10 (×2): qty 1

## 2022-09-10 MED ORDER — SODIUM CHLORIDE 0.9 % IV SOLN
2.0000 g | Freq: Once | INTRAVENOUS | Status: AC
  Administered 2022-09-10: 2 g via INTRAVENOUS
  Filled 2022-09-10: qty 20

## 2022-09-10 MED ORDER — SODIUM CHLORIDE 0.9 % IV BOLUS
1000.0000 mL | Freq: Once | INTRAVENOUS | Status: AC
  Administered 2022-09-10: 1000 mL via INTRAVENOUS

## 2022-09-10 MED ORDER — ONDANSETRON HCL 4 MG/2ML IJ SOLN
4.0000 mg | Freq: Once | INTRAMUSCULAR | Status: AC
  Administered 2022-09-10: 4 mg via INTRAVENOUS
  Filled 2022-09-10: qty 2

## 2022-09-10 MED ORDER — METHYLPREDNISOLONE SODIUM SUCC 40 MG IJ SOLR: 40.0000 mg | Freq: Every day | INTRAMUSCULAR | Status: DC

## 2022-09-10 MED ORDER — HYDROMORPHONE HCL 1 MG/ML IJ SOLN
1.0000 mg | Freq: Once | INTRAMUSCULAR | Status: AC
  Administered 2022-09-10: 1 mg via INTRAVENOUS
  Filled 2022-09-10: qty 1

## 2022-09-10 MED ORDER — SODIUM CHLORIDE 0.9 % IV SOLN
2.0000 g | INTRAVENOUS | Status: DC
  Administered 2022-09-11: 2 g via INTRAVENOUS
  Filled 2022-09-10: qty 20

## 2022-09-10 MED ORDER — HYDROMORPHONE HCL 1 MG/ML IJ SOLN
0.5000 mg | INTRAMUSCULAR | Status: DC | PRN
  Administered 2022-09-10 – 2022-09-13 (×11): 0.5 mg via INTRAVENOUS
  Filled 2022-09-10 (×11): qty 0.5

## 2022-09-10 MED ORDER — SODIUM CHLORIDE 0.9 % IV SOLN: INTRAVENOUS | Status: DC

## 2022-09-10 MED ORDER — METHYLPREDNISOLONE SODIUM SUCC 40 MG IJ SOLR
40.0000 mg | Freq: Once | INTRAMUSCULAR | Status: AC
  Administered 2022-09-10: 40 mg via INTRAVENOUS
  Filled 2022-09-10: qty 1

## 2022-09-10 MED ORDER — INSULIN ASPART 100 UNIT/ML IJ SOLN
0.0000 [IU] | Freq: Three times a day (TID) | INTRAMUSCULAR | Status: DC
  Administered 2022-09-10 – 2022-09-11 (×3): 3 [IU] via SUBCUTANEOUS
  Administered 2022-09-11: 2 [IU] via SUBCUTANEOUS
  Administered 2022-09-12 – 2022-09-13 (×3): 5 [IU] via SUBCUTANEOUS

## 2022-09-10 MED ORDER — TIZANIDINE HCL 4 MG PO TABS
2.0000 mg | ORAL_TABLET | Freq: Three times a day (TID) | ORAL | Status: DC | PRN
  Administered 2022-09-10: 2 mg via ORAL
  Filled 2022-09-10: qty 1

## 2022-09-10 MED ORDER — TAMSULOSIN HCL 0.4 MG PO CAPS
0.4000 mg | ORAL_CAPSULE | Freq: Every day | ORAL | Status: DC
  Administered 2022-09-10 – 2022-09-12 (×3): 0.4 mg via ORAL
  Filled 2022-09-10 (×3): qty 1

## 2022-09-10 MED ORDER — ENOXAPARIN SODIUM 40 MG/0.4ML IJ SOSY
40.0000 mg | PREFILLED_SYRINGE | INTRAMUSCULAR | Status: DC
  Administered 2022-09-10 – 2022-09-12 (×3): 40 mg via SUBCUTANEOUS
  Filled 2022-09-10 (×3): qty 0.4

## 2022-09-10 MED ORDER — INSULIN ASPART 100 UNIT/ML IJ SOLN
0.0000 [IU] | Freq: Every day | INTRAMUSCULAR | Status: DC
  Administered 2022-09-11 – 2022-09-12 (×2): 2 [IU] via SUBCUTANEOUS

## 2022-09-10 MED ORDER — ORAL CARE MOUTH RINSE: 15.0000 mL | OROMUCOSAL | Status: DC | PRN

## 2022-09-10 MED ORDER — INFLUENZA VAC A&B SA ADJ QUAD 0.5 ML IM PRSY
0.5000 mL | PREFILLED_SYRINGE | INTRAMUSCULAR | Status: AC
  Administered 2022-09-13: 0.5 mL via INTRAMUSCULAR
  Filled 2022-09-10 (×2): qty 0.5

## 2022-09-10 MED ORDER — HYDROMORPHONE HCL 1 MG/ML IJ SOLN
0.5000 mg | Freq: Once | INTRAMUSCULAR | Status: AC
  Administered 2022-09-10: 0.5 mg via INTRAVENOUS
  Filled 2022-09-10: qty 1

## 2022-09-10 MED ORDER — POTASSIUM CHLORIDE CRYS ER 20 MEQ PO TBCR
40.0000 meq | EXTENDED_RELEASE_TABLET | Freq: Every day | ORAL | Status: DC
  Administered 2022-09-10 – 2022-09-13 (×4): 40 meq via ORAL
  Filled 2022-09-10 (×4): qty 2

## 2022-09-10 MED ORDER — IOHEXOL 300 MG/ML  SOLN
100.0000 mL | Freq: Once | INTRAMUSCULAR | Status: AC | PRN
  Administered 2022-09-10: 100 mL via INTRAVENOUS

## 2022-09-10 MED ORDER — PROCHLORPERAZINE EDISYLATE 10 MG/2ML IJ SOLN
10.0000 mg | Freq: Four times a day (QID) | INTRAMUSCULAR | Status: DC | PRN
  Administered 2022-09-10: 10 mg via INTRAVENOUS
  Filled 2022-09-10: qty 2

## 2022-09-10 NOTE — H&P (Addendum)
History and Physical    Patient: [name] LKT:[id number] DOB: Jun 03, 1956 DOA: 09/09/2022 DOS: the patient was seen and examined on 09/10/2022 PCP: [name], MD  Patient coming from: Home  Chief Complaint:  Chief Complaint  Patient presents with   Abdominal Pain   HPI: [name] is a 66 y.o. male with medical history significant of Crohn's, BPH, DM2. Presenting with 5 days of RLQ/LLQ abdominal pain. It was sudden onset. It is crampy and stabbing in nature. He has been complaint with his normal med. He has not had any N/V/D. No frank blood seen. When his symptoms did not improve yesterday, he decided to come to the ED for evaluation. He denies any other aggravating of alleviating factors.    Review of Systems: As mentioned in the history of present illness. All other systems reviewed and are negative. Past Medical History:  Diagnosis Date   Arthritis    Bladder stone    Cancer ([place])    mycosis fungoides   Crohn's disease ([place])    Diabetes mellitus without complication ([place])    History of aseptic necrosis of bone BILATERAL HIPS   S/P BONE GRAFT   History of kidney stones    History of mycosis fungoides    S/P ileostomy ([facility])    Past Surgical History:  Procedure Laterality Date   BONE GRAFT OF LEFT HIP  1986   ASEPTIC NECROSIS   COLONOSCOPY     CYSTO/ BILATERAL RETROGRADE PYELOGRAM/ LEFT URETERAL STONE EXTRACTION / BILATERAL STENT PLACEMENT  10-07-2003   CYSTOSCOPY WITH LITHOLAPAXY  02/12/2012   Procedure: CYSTOSCOPY WITH LITHOLAPAXY;  Surgeon: Claybon Jabs, MD;  Location: [facility];  Service: Urology;  Laterality: N/A;   EXPL. LAP. W/ ENTEROLYSIS, RESECTION INFLAMMATORY MASS RLQ / TAKE-DOWN OF FISTULA WITH ENTEROENTEROSTOMY/ RESECTION WITH THE SMALL BOWEL TO ASCENDING COLON ANASTOMOSIS  10-29-2000   CROHN'S DISEASE W/ ANASTOMOTIC INFLAMMATORY MASS/    10-30-2000 EXPL. LAP. CONTROL POST-OP ABD. BLEEDING   EXPLORATORY LAPAROTOMY/ RESECTION OF  ILEOCOLONIC ANASTOMOSIS AND CREATION OF ILEOSTOMY  11-04-2000   ABD. PERFORATION   HARDWARE REMOVAL Left 08/07/2022   Procedure: HARDWARE REMOVAL;  Surgeon: Dorna Leitz, MD;  Location: [facility];  Service: Orthopedics;  Laterality: Left;   ILEOSTOMY CLOSURE N/A 07/23/2015   Procedure: Takedown ileostomy and repair of ostomy hernia, extensive entrolysis (2.5 hrs), ileostransverse colon anastomosis; closure of massive ventral hernia with 20 x 30 Strattice mesh;  Surgeon: Johnathan Hausen, MD;  Location: [facility];  Service: General;  Laterality: N/A;   IRRIGATION AND DEBRIDEMENT ABSCESS Right 09/09/2014   Procedure: MINOR INCISION AND DRAINAGE OF ABSCESS;  Surgeon: Charlotte Crumb, MD;  Location: [facility];  Service: Orthopedics;  Laterality: Right;  incision and drainage right long finger    IRRIGATION AND DEBRIDEMENT ABSCESS N/A 01/03/2017   Procedure: IRRIGATION AND DEBRIDEMENT ABDOMINAL WALL ABSCESS;  Surgeon: Johnathan Hausen, MD;  Location: [facility];  Service: General;  Laterality: N/A;   LAPAROSCOPY N/A 07/23/2015   Procedure: LAPAROSCOPY DIAGNOSTIC;  Surgeon: Johnathan Hausen, MD;  Location: [facility];  Service: General;  Laterality: N/A;   ORIF ANKLE FRACTURE Left 08/27/2014   Procedure:  open reduction internal fixation left ankle;  Surgeon: Nita Sells, MD;  Location: [facility];  Service: Orthopedics;  Laterality: Left;  Left open reduction internal fixation ankle   RESECTION OF TERMINAL ILEUM  1983   RIGHT URETEROSCOPIC STONE EXTRACITON  04-24-2005  & 12-01-2002   TOTAL HIP ARTHROPLASTY Left 08/07/2022  Procedure: TOTAL HIP ARTHROPLASTY ANTERIOR APPROACH;  Surgeon: Dorna Leitz, MD;  Location: [facility];  Service: Orthopedics;  Laterality: Left;   Social History:  reports that he quit smoking about 28 years ago. His smoking use included cigarettes. He has never used smokeless tobacco. He reports that he does not drink alcohol and does not use drugs.  No Known Allergies  Family  History  Problem Relation Age of Onset   Alzheimer's disease Father    Cancer Father    Cancer Sister     Prior to Admission medications   Medication Sig Start Date End Date Taking? Authorizing Provider  aspirin EC 325 MG tablet Take 1 tablet (325 mg total) by mouth 2 (two) times daily after a meal. Take x 1 month post op to decrease risk of blood clots. 08/07/22  Yes Gary Fleet, PA-C  celecoxib (CELEBREX) 200 MG capsule Take 1 capsule (200 mg total) by mouth 2 (two) times daily. 08/07/22 08/07/23 Yes Gary Fleet, PA-C  Cholecalciferol (DIALYVITE VITAMIN D 5000) 125 MCG (5000 UT) capsule Take 5,000 Units by mouth daily.   Yes [provider]  Cyanocobalamin (B-12) 2500 MCG TABS Take 2,500 mcg by mouth daily.   Yes [provider]  docusate sodium (COLACE) 100 MG capsule Take 1 capsule (100 mg total) by mouth 2 (two) times daily. 08/07/22  Yes Gary Fleet, PA-C  mesalamine (LIALDA) 1.2 g EC tablet Take 2.4 g by mouth 2 (two) times daily. 07/07/22  Yes [provider]  metFORMIN (GLUCOPHAGE) 500 MG tablet Take 500 mg by mouth 2 (two) times daily. 05/23/21  Yes [provider]  potassium citrate (UROCIT-K) 10 MEQ (1080 MG) SR tablet Take 40 mEq by mouth 4 (four) times daily.   Yes [provider]  tadalafil (CIALIS) 20 MG tablet Take 20 mg by mouth daily as needed for erectile dysfunction. 05/04/21  Yes [provider]  tamsulosin (FLOMAX) 0.4 MG CAPS capsule Take 1 capsule (0.4 mg total) by mouth daily after supper. 01/12/17  Yes Earnstine Regal, PA-C  tiZANidine (ZANAFLEX) 2 MG tablet Take 1 tablet (2 mg total) by mouth every 8 (eight) hours as needed for muscle spasms. 08/07/22  Yes Gary Fleet, PA-C  Trospium Chloride 60 MG CP24 Take 1 capsule by mouth daily. 06/04/22  Yes [provider]  blood glucose meter kit and supplies Dispense based on patient and insurance preference. Use up to four times daily as directed. (FOR ICD-9  250.00, 250.01). 01/12/17   Earnstine Regal, PA-C  oxyCODONE-acetaminophen (PERCOCET/ROXICET) 5-325 MG tablet Take 1-2 tablets by mouth every 6 (six) hours as needed for severe pain. Patient not taking: Reported on 09/10/2022 08/07/22   Gary Fleet, PA-C    Physical Exam: Vitals:   09/10/22 0520 09/10/22 0630 09/10/22 0658 09/10/22 0813  BP: (!) 131/107 138/86 138/86 (!) 151/91  Pulse: 95 96 89 87  Resp: _0 Temp:   97.7 F (36.5 C) 97.8 F (36.6 C)  TempSrc:   Oral Oral  SpO2: 99% 96% 99% 97%   General: 66 y.o. male resting in bed in NAD Eyes: PERRL, normal sclera ENMT: Nares patent w/o discharge, orophaynx clear, dentition normal, ears w/o discharge/lesions/ulcers Neck: Supple, trachea midline Cardiovascular: RRR, +S1, S2, no m/g/r, equal pulses throughout Respiratory: CTABL, no w/r/r, normal WOB GI: BS+, mild distention, but soft, RLQ/LLQ TTP, bandaging over RUQ CDI MSK: No e/c/c Neuro: A&O x 3, no focal deficits Psyc: Appropriate interaction and affect, calm/cooperative  Data Reviewed:  Results for orders placed or performed during the hospital encounter of 09/09/22 (from the past 24 hour(s))  CBC with Differential     Status: Abnormal   Collection Time: 09/09/22  7:54 PM  Result Value Ref Range   WBC 17.1 (H) 4.0 - 10.5 K/uL   RBC 4.91 4.22 - 5.81 MIL/uL   Hemoglobin 13.0 13.0 - 17.0 g/dL   HCT 41.8 39.0 - 52.0 %   MCV 85.1 80.0 - 100.0 fL   MCH 26.5 26.0 - 34.0 pg   MCHC 31.1 30.0 - 36.0 g/dL   RDW 14.9 11.5 - 15.5 %   Platelets 303 150 - 400 K/uL   nRBC 0.0 0.0 - 0.2 %   Neutrophils Relative % 89 %   Neutro Abs 15.1 (H) 1.7 - 7.7 K/uL   Lymphocytes Relative 5 %   Lymphs Abs 0.9 0.7 - 4.0 K/uL   Monocytes Relative 6 %   Monocytes Absolute 1.0 0.1 - 1.0 K/uL   Eosinophils Relative 0 %   Eosinophils Absolute 0.0 0.0 - 0.5 K/uL   Basophils Relative 0 %   Basophils Absolute 0.0 0.0 - 0.1 K/uL   Immature Granulocytes 0 %   Abs Immature Granulocytes  0.06 0.00 - 0.07 K/uL  Comprehensive metabolic panel     Status: Abnormal   Collection Time: 09/09/22  7:54 PM  Result Value Ref Range   Sodium 141 135 - 145 mmol/L   Potassium 3.3 (L) 3.5 - 5.1 mmol/L   Chloride 105 98 - 111 mmol/L   CO2 22 22 - 32 mmol/L   Glucose, Bld 180 (H) 70 - 99 mg/dL   BUN 19 8 - 23 mg/dL   Creatinine, Ser 1.17 0.61 - 1.24 mg/dL   Calcium 9.6 8.9 - 10.3 mg/dL   Total Protein 7.8 6.5 - 8.1 g/dL   Albumin 3.7 3.5 - 5.0 g/dL   AST 19 15 - 41 U/L   ALT 10 0 - 44 U/L   Alkaline Phosphatase 89 38 - 126 U/L   Total Bilirubin 0.6 0.3 - 1.2 mg/dL   GFR, Estimated >60 >60 mL/min   Anion gap 14 5 - 15  Lipase, blood     Status: None   Collection Time: 09/09/22  7:54 PM  Result Value Ref Range   Lipase 28 11 - 51 U/L   CT ab/pelvis 1. Surgical changes of ileocolectomy and distal small-bowel resection. 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 Milder changes seen proximal to the small bowel anastomosis. These findings appear similar to prior examination though the extent of mild inflammatory change proximal to the small bowel anastomosis appears to involve a longer segment. No evidence of obstruction, abscess, perforation or intra-abdominal fistula. 2. Mild distal colonic diverticulosis without superimposed acute inflammatory change. 3. Mild bilateral nonobstructing nephrolithiasis. Serial 4 and 6 mm nonobstructing calculi within the distal left ureter just proximal to the left ureterovesicular junction. No hydronephrosis. 4. Moderate prostatic enlargement. 5. Aortic atherosclerosis.  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 Johney Maine); rec'd getting it pouch'd for now. Need definitive GI  plan. Appreciate assistance. [facility] consulted for ostomy pouch.  UTI     - follow UCx     -  continue rocephin  Hypokalemia    - replace K+, check Mg2+  DM2     - SSI, glucose checks, FLD for now  BPH     - continue home regimen  Advance Care Planning:   Code Status: FULL  Consults: [facility]  Family Communication: None at bedside  Severity of Illness: The appropriate patient status for this patient is INPATIENT. Inpatient status is judged to be reasonable and necessary in order to provide the required intensity of service to ensure the patient's safety. The patient's presenting symptoms, physical exam findings, and initial radiographic and laboratory data in the context of their chronic comorbidities is felt to place them at high risk for further clinical deterioration. Furthermore, it is not anticipated that the patient will be medically stable for discharge from the hospital within 2 midnights of admission.   * I certify that at the point of admission it is my clinical judgment that the patient will require inpatient hospital care spanning beyond 2 midnights from the point of admission due to high intensity of service, high risk for further deterioration and high frequency of surveillance required.*  Author: Jonnie Finner, DO 09/10/2022 9:43 AM  For on call review [email or web address].

## 2022-09-10 NOTE — Progress Notes (Signed)
Patient has pinhole area to RLQ from prior surgery, says Dr. Hassell Done is managing this but has not seen him for this since June 2023.  Patient has large amounts of fluid coming from this area, soaking through multiple gauze and ABD pads and soaking bed.  Patient says he uses a binder at home which seems to help.  Text page sent to admitting MD regarding this.

## 2022-09-10 NOTE — Consult Note (Signed)
Referring Provider: Dr. Marylyn Ishihara Primary Care Physician:  [name], MD Primary Gastroenterologist:  Dr. Alessandra Bevels  Reason for Consultation:  Crohn's flare  HPI: [name] is a 66 y.o. male with long history of Crohn's disease who has multiple small bowel surgeries last in 2002 when he had a ileocectomy and has a known ileocolonic anastomosis stricture (anastomosis was not traversed) that was noted on his last colonoscopy. History of abscess of the small bowel and had drainage of the abscess in 2018. Take down of a paraileostomy hernia and ileostomy and creation of a distal small bowel to transverse colon anastomosis and lysis of adhesions. 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 When he saw Dr. Alessandra Bevels on 09/07/22 he was not having abdominal pain. Has chronic intermittent diarrhea that he attributes to Metformin. He had the acute onset of crampy and sharp stabbing diffuse abdominal pain 5 days ago (his pain was occurring prior to his visit on 09/07/22 but during that visit he denied abdominal pain). Denies rectal bleeding or hematochezia. Denies N/V/F/C. Years ago he was on Remicade and started on Humira but had not had his first injection yet. He was on Mesalamine 2.4 g BID prior to admit and has been on a steroids intermittently in the past.  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   Crohn's disease ([place])    Diabetes mellitus without complication ([place])    History of aseptic necrosis of bone BILATERAL HIPS   S/P BONE GRAFT   History of kidney stones    History of mycosis fungoides    S/P ileostomy ([facility])     Past Surgical History:  Procedure Laterality Date   BONE GRAFT OF LEFT HIP  1986   ASEPTIC NECROSIS   COLONOSCOPY     CYSTO/ BILATERAL RETROGRADE PYELOGRAM/ LEFT URETERAL STONE EXTRACTION / BILATERAL STENT PLACEMENT  10-07-2003   CYSTOSCOPY WITH LITHOLAPAXY   02/12/2012   Procedure: CYSTOSCOPY WITH LITHOLAPAXY;  Surgeon: Claybon Jabs, MD;  Location: [place];  Service: Urology;  Laterality: N/A;   EXPL. LAP. W/ ENTEROLYSIS, RESECTION INFLAMMATORY MASS RLQ / TAKE-DOWN OF FISTULA WITH ENTEROENTEROSTOMY/ RESECTION WITH THE SMALL BOWEL TO ASCENDING COLON ANASTOMOSIS  10-29-2000   CROHN'S DISEASE W/ ANASTOMOTIC INFLAMMATORY MASS/    10-30-2000 EXPL. LAP. CONTROL POST-OP ABD. BLEEDING   EXPLORATORY LAPAROTOMY/ RESECTION OF ILEOCOLONIC ANASTOMOSIS AND CREATION OF ILEOSTOMY  11-04-2000   ABD. PERFORATION   HARDWARE REMOVAL Left 08/07/2022   Procedure: HARDWARE REMOVAL;  Surgeon: Dorna Leitz, MD;  Location: [facility];  Service: Orthopedics;  Laterality: Left;   ILEOSTOMY CLOSURE N/A 07/23/2015   Procedure: Takedown ileostomy and repair of ostomy hernia, extensive entrolysis (2.5 hrs), ileostransverse colon anastomosis; closure of massive ventral hernia with 20 x 30 Strattice mesh;  Surgeon: Johnathan Hausen, MD;  Location: [facility];  Service: General;  Laterality: N/A;   IRRIGATION AND DEBRIDEMENT ABSCESS Right 09/09/2014   Procedure: MINOR INCISION AND DRAINAGE OF ABSCESS;  Surgeon: Charlotte Crumb, MD;  Location: [facility];  Service: Orthopedics;  Laterality: Right;  incision and drainage right long finger    IRRIGATION AND DEBRIDEMENT ABSCESS N/A 01/03/2017   Procedure: IRRIGATION AND DEBRIDEMENT ABDOMINAL WALL ABSCESS;  Surgeon: Johnathan Hausen, MD;  Location: [facility];  Service: General;  Laterality: N/A;   LAPAROSCOPY N/A 07/23/2015   Procedure: LAPAROSCOPY DIAGNOSTIC;  Surgeon:  Johnathan Hausen, MD;  Location: [facility];  Service: General;  Laterality: N/A;   ORIF ANKLE FRACTURE Left 08/27/2014   Procedure:  open reduction internal fixation left ankle;  Surgeon: Nita Sells, MD;  Location: [place];  Service: Orthopedics;  Laterality: Left;  Left open reduction internal fixation ankle   RESECTION OF TERMINAL ILEUM  1983   RIGHT  URETEROSCOPIC STONE EXTRACITON  04-24-2005  & 12-01-2002   TOTAL HIP ARTHROPLASTY Left 08/07/2022   Procedure: TOTAL HIP ARTHROPLASTY ANTERIOR APPROACH;  Surgeon: Dorna Leitz, MD;  Location: [facility];  Service: Orthopedics;  Laterality: Left;    Prior to Admission medications   Medication Sig Start Date End Date Taking? Authorizing Provider  aspirin EC 325 MG tablet Take 1 tablet (325 mg total) by mouth 2 (two) times daily after a meal. Take x 1 month post op to decrease risk of blood clots. 08/07/22  Yes Gary Fleet, PA-C  celecoxib (CELEBREX) 200 MG capsule Take 1 capsule (200 mg total) by mouth 2 (two) times daily. 08/07/22 08/07/23 Yes Gary Fleet, PA-C  Cholecalciferol (DIALYVITE VITAMIN D 5000) 125 MCG (5000 UT) capsule Take 5,000 Units by mouth daily.   Yes [provider]  Cyanocobalamin (B-12) 2500 MCG TABS Take 2,500 mcg by mouth daily.   Yes [provider]  docusate sodium (COLACE) 100 MG capsule Take 1 capsule (100 mg total) by mouth 2 (two) times daily. 08/07/22  Yes Gary Fleet, PA-C  mesalamine (LIALDA) 1.2 g EC tablet Take 2.4 g by mouth 2 (two) times daily. 07/07/22  Yes [provider]  metFORMIN (GLUCOPHAGE) 500 MG tablet Take 500 mg by mouth 2 (two) times daily. 05/23/21  Yes [provider]  potassium citrate (UROCIT-K) 10 MEQ (1080 MG) SR tablet Take 40 mEq by mouth 4 (four) times daily.   Yes [provider]  tadalafil (CIALIS) 20 MG tablet Take 20 mg by mouth daily as needed for erectile dysfunction. 05/04/21  Yes [provider]  tamsulosin (FLOMAX) 0.4 MG CAPS capsule Take 1 capsule (0.4 mg total) by mouth daily after supper. 01/12/17  Yes Earnstine Regal, PA-C  tiZANidine (ZANAFLEX) 2 MG tablet Take 1 tablet (2 mg total) by mouth every 8 (eight) hours as needed for muscle spasms. 08/07/22  Yes Gary Fleet, PA-C  Trospium Chloride 60 MG CP24 Take 1 capsule by mouth daily. 06/04/22  Yes [provider]  blood  glucose meter kit and supplies Dispense based on patient and insurance preference. Use up to four times daily as directed. (FOR ICD-9 250.00, 250.01). 01/12/17   Earnstine Regal, PA-C  oxyCODONE-acetaminophen (PERCOCET/ROXICET) 5-325 MG tablet Take 1-2 tablets by mouth every 6 (six) hours as needed for severe pain. Patient not taking: Reported on 09/10/2022 08/07/22   Gary Fleet, PA-C    Scheduled Meds:  enoxaparin (LOVENOX) injection  40 mg Subcutaneous Q24H   [START ON 09/11/2022] influenza vaccine adjuvanted  0.5 mL Intramuscular Tomorrow-1000   insulin aspart  0-15 Units Subcutaneous TID WC   insulin aspart  0-5 Units Subcutaneous QHS   [START ON 09/11/2022] methylPREDNISolone (SOLU-MEDROL) injection  40 mg Intravenous Daily   potassium chloride  40 mEq Oral Daily   tamsulosin  0.4 mg Oral QPC supper   Continuous Infusions:  sodium chloride 75 mL/hr at 09/10/22 1204   [START ON 09/11/2022] cefTRIAXone (ROCEPHIN)  IV     PRN Meds:.HYDROmorphone (DILAUDID) injection, mouth rinse, prochlorperazine, tiZANidine  Allergies as of 09/09/2022   (No Known Allergies)  Family History  Problem Relation Age of Onset   Alzheimer's disease Father    Cancer Father    Cancer Sister     Social History   Socioeconomic History   Marital status: Married    Spouse name: Not on file   Number of children: 3   Years of education: Not on file   Highest education level: Not on file  Occupational History   Not on file  Tobacco Use   Smoking status: Former    Types: Cigarettes    Quit date: 08/26/1994    Years since quitting: 28.0   Smokeless tobacco: Never  Vaping Use   Vaping Use: Never used  Substance and Sexual Activity   Alcohol use: No   Drug use: No   Sexual activity: Never  Other Topics Concern   Not on file  Social History Narrative   Not on file   Social Determinants of Health   Financial Resource Strain: Not on file  Food Insecurity: No Food Insecurity (09/10/2022)    Hunger Vital Sign    Worried About Running Out of Food in the Last Year: Never true    Ran Out of Food in the Last Year: Never true  Transportation Needs: No Transportation Needs (09/10/2022)   PRAPARE - Hydrologist (Medical): No    Lack of Transportation (Non-Medical): No  Physical Activity: Not on file  Stress: Not on file  Social Connections: Not on file  Intimate Partner Violence: Not At Risk (09/10/2022)   Humiliation, Afraid, Rape, and Kick questionnaire    Fear of Current or Ex-Partner: No    Emotionally Abused: No    Physically Abused: No    Sexually Abused: No    Review of Systems: All negative except as stated above in HPI.  Physical Exam: Vital signs: Vitals:   09/10/22 0813 09/10/22 1128  BP: (!) 151/91 138/80  Pulse: 87 84  Resp: 18 17  Temp: 97.8 F (36.6 C) (!) 97.5 F (36.4 C)  SpO2: 97% 99%   Last BM Date : 09/08/22 General:  Lethargic, Well-developed, well-nourished, pleasant and cooperative in NAD Head: normocephalic, atraumatic Eyes: anicteric sclera ENT: oropharynx clear Neck: supple, nontender Lungs:  Clear throughout to auscultation.   No wheezes, crackles, or rhonchi. No acute distress. Heart:  Regular rate and rhythm; no murmurs, clicks, rubs,  or gallops. Abdomen: midline surgical scarring and subcutaneous focal bulge, dressing on right side not removed, diffuse tenderness with guarding, mild distention, +BS  Rectal:  Deferred Ext: no edema  GI:  Lab Results: Recent Labs    09/09/22 1954 09/10/22 1250  WBC 17.1* 5.4  HGB 13.0 10.9*  HCT 41.8 36.0*  PLT 303 195   BMET Recent Labs    09/09/22 1954 09/10/22 1250  NA 141 139  K 3.3* 4.1  CL 105 108  CO2 22 24  GLUCOSE 180* 187*  BUN 19 19  CREATININE 1.17 0.97  CALCIUM 9.6 7.9*   LFT Recent Labs    09/10/22 1250  PROT 6.7  ALBUMIN 3.0*  AST 15  ALT 10  ALKPHOS 98  BILITOT 0.5   PT/INR No results for input(s): "LABPROT", "INR" in the  last 72 hours.   Studies/Results: CT Abdomen Pelvis W Contrast  Result Date: 09/10/2022 CLINICAL DATA:  Generalized abdominal pain, dominant distension, nausea, vomiting. Crohn's disease EXAM: CT ABDOMEN AND PELVIS WITH CONTRAST TECHNIQUE: Multidetector CT imaging of the abdomen and pelvis was performed using the standard protocol  following bolus administration of intravenous contrast. RADIATION DOSE REDUCTION: This exam was performed according to the departmental dose-optimization program which includes automated exposure control, adjustment of the mA and/or kV according to patient size and/or use of iterative reconstruction technique. CONTRAST:  129m OMNIPAQUE IOHEXOL 300 MG/ML  SOLN COMPARISON:  07/04/2022 FINDINGS: Lower chest: No acute abnormality. Hepatobiliary: Multiple hepatic cysts again identified. No enhancing intrahepatic mass. No intra or extrahepatic biliary ductal dilation. Gallbladder unremarkable. Pancreas: Unremarkable Spleen: Unremarkable Adrenals/Urinary Tract: The adrenal glands are unremarkable. The kidneys are normal in position. Asymmetric left renal cortical atrophy again noted. Multiple cortical hypodensities are again seen within the left kidney the largest of which are compatible with a simple cortical cyst. No follow-up imaging is recommended for these lesions. Multiple bilateral nonobstructing renal calculi are identified measuring up to 13 mm within the upper pole the left kidney. No hydronephrosis. Serial 4 and 6 mm nonobstructing calculi are seen within the distal left ureter just proximal to the left ureterovesicular junction. No ureteral calculi on the right. The bladder is unremarkable. Stomach/Bowel: Surgical changes of ileocolectomy and distal small-bowel resection are identified. There is long segment bowel wall thickening, mucosal hyperemia, and mesenteric inflammatory stranding involving the terminal small bowel extending from the small bowel anastomosis to the  ileocolic anastomosis in keeping with changes of active inflammation related to the patient's underlying inflammatory bowel disease. There is, additionally, more mild relative hyperemia involving multiple small bowel loops proximal to small bowel anastomosis in keeping with additional sites of milder inflammatory change. These findings appear similar to prior examination though the extent of mild inflammatory change proximal small bowel anastomosis appears to involve a longer segment. There is no evidence of obstruction. No free intraperitoneal gas. No loculated intra-abdominal fluid collections or intra-abdominal fistula are identified. Shotty mesenteric adenopathy is again identified, likely reactive in nature. Stomach and proximal small bowel is unremarkable. Mild distal colonic diverticulosis without superimposed acute inflammatory change in this region. Vascular/Lymphatic: Aortic atherosclerosis. No enlarged abdominal or pelvic lymph nodes. Reproductive: Moderate prostatic enlargement. Other: Right lower quadrant broad-based fat containing incisional hernia related to remote ostomy. Mild broad-based fat containing epigastric ventral hernia. Musculoskeletal: No acute bone abnormality. 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 Left total hip arthroplasty has been performed. Remote L2 superior endplate fracture noted. IMPRESSION: 1. Surgical changes of ileocolectomy and distal small-bowel resection. 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 Milder changes seen proximal to the small bowel anastomosis. These findings appear similar to prior examination though the extent of mild inflammatory change proximal to the small bowel anastomosis appears to involve a longer segment. No evidence of obstruction, abscess,  perforation or intra-abdominal fistula. 2. Mild distal colonic diverticulosis without superimposed acute inflammatory change. 3. Mild bilateral nonobstructing nephrolithiasis. Serial 4 and 6 mm nonobstructing calculi within the distal left ureter just proximal to the left ureterovesicular junction. No hydronephrosis. 4. Moderate prostatic enlargement. 5. Aortic atherosclerosis. Aortic Atherosclerosis (ICD10-I70.0). Electronically Signed   By: AFidela SalisburyM.D.   On: 09/10/2022 05:18    Impression/Plan: Abdominal pain likely due to a Crohn's flare with long segment of inflammation in the distal small bowel proximal to the ileocolonic anastomosis. Solumedrol 40 mg IV Q 12 hours. Full liquids ok. Supportive care. Hold off on Humira induction as inpatient. [facility] will f/u.    LOS: 0 days  Lear Ng  09/10/2022, 2:20 PM  Questions please call [phone]

## 2022-09-10 NOTE — ED Notes (Signed)
Called lab for urine culture and GC/Chlamydia urine add on.

## 2022-09-10 NOTE — Consult Note (Signed)
Note: Portions of this report may have been transcribed using voice recognition software. Every effort was made to ensure accuracy; however, inadvertent computerized transcription errors may be present.   Any transcriptional errors that result from this process are unintentional.              [name]  1956/06/09 [id number]  Patient Care Team: [name], MD as PCP - General (Internal Medicine) Johnathan Hausen, MD as Consulting Physician (General Surgery) Otis Brace, MD as Consulting Physician (Gastroenterology)  This patient is a 66 y.o.male who presents today for surgical evaluation at the request of Dr. Marylyn Ishihara, [facility] internal medicine hospitalist service.   Reason for visit: Chronic draining abdominal wound with history of Crohn's  Patient with Crohn's disease for most of his life.  Treated at [facility] and for the past few decades by [facility] gastroenterology (Dr Oletta Lamas & now Dr Alessandra Bevels) and Dr. Hassell Done with [facility].  There had been discussed of using biologic but I do not think he is currently on that.  He has been off medications for some years but I think more recently has been on Lialda.  Patient has had has had numerous abdominal operations.  This included:  -Open ileocolectomy for Crohn's disease at [facility]. 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  Ileo-ascending colon anastomosis 2001. -Takeback, washout, with creation of end ileostomy 2001 -Laparoscopic converted to open lysis adhesions with takedown of ileostomy.  Small bowel to transverse colon anastomosis, closure of parastomal hernia, complex abdominal repair with 30 x 20 cm Strattice biologic mesh 07/23/2015. -Incision and drainage of right-sided abdominal wall abscess with strong evidence of colocutaneous fistula 01/03/2017.  Sounds like the patient's had chronic intermittent drainage on the right side of the abdomen.  Initially  concern for colocutaneous fistula that calmed down.  Has had intermittent sinus drainage.  Patient usually desires silver nitrate cauterization to help close it down but it is never really fully close down.  Seen by Dr. Hassell Done in 2020 and then in March 2023 of this year.  Concern possibility it could be chronic irritation from his old Strattice biologic mesh.  No strong evidence of any bilious or succus or feculent drainage.  Patient apparently with worsening abdominal pain and worsening drainage.  Admitted.  CT scan shows evidence of small bowel inflammation and thickening proximal to his 2 anastomoses suspicious for recurrent Crohn's disease.  No definite gas pockets suspicious for active abscess.  Patient also history history of bilateral kidney stones nephrolithiasis that needed prior treatments by [facility] with some prostatic enlargement.  Diabetes and hypertension.  Patient does have inflamed ileum that connects with the right lateral abdominal wound sinus tract.  I recommend Eakin's pouching around this.  Sounds like he is try to use close to back to control this in the past.  Recommend gastrology consultation.  Probably needs more aggressive immunosuppressive regimen to get his Crohn's under control.  Patient would have a very high risk of surgery with a hostile abdomen with incisional hernia, chronic fistula drainage, small bowel just underneath old mesh/skin.  Any operative intervention would fall apart and breakdown.  May come to needing some intervention but would hold off for 6 months until this can get under control.  Dr. Hassell Done I believe will be here operating tomorrow so he could, and since he is known the patient for several decades.  Dr. Marcello Moores with our group in seeing him 2018 when he was an inpatient.  Surgery can help follow but await input from gastroenterology first.   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/10/2022   Intractable hiccups 08/08/2022   Primary osteoarthritis of left hip 08/07/2022   Bilateral nephrolithiasis 06/13/2021   Diabetes mellitus type 2 in nonobese ([place]) 01/12/2017   Hypokalemia 01/04/2017   Hypomagnesemia 01/04/2017   Malnutrition of moderate degree 01/04/2017    Past Medical History:  Diagnosis Date   Abdominal wall abscess 01/03/2017   Arthritis    Bladder stone    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   Crohn's disease ([place])    Crohn's disease with abscess ([place]) 07/23/2015   Diabetes mellitus without complication (H[facility])    History of aseptic necrosis of bone BILATERAL HIPS   S/P BONE GRAFT   History of kidney stones    History of mycosis fungoides    S/P ileostomy ([place])     Past Surgical History:  Procedure Laterality Date   BONE GRAFT OF LEFT HIP  11/13/1984   ASEPTIC NECROSIS   COLONOSCOPY     CYSTO/ BILATERAL RETROGRADE PYELOGRAM/ LEFT URETERAL STONE EXTRACTION / BILATERAL STENT PLACEMENT  10/07/2003   CYSTOSCOPY WITH LITHOLAPAXY  02/12/2012   Procedure: CYSTOSCOPY WITH LITHOLAPAXY;  Surgeon: Claybon Jabs, MD;  Location: [facility];  Service: Urology;  Laterality: N/A;   EXPL. LAP. W/ ENTEROLYSIS, RESECTION INFLAMMATORY MASS RLQ / TAKE-DOWN OF FISTULA WITH ENTEROENTEROSTOMY/ RESECTION WITH THE SMALL BOWEL TO ASCENDING COLON ANASTOMOSIS  10/29/2000   CROHN'S DISEASE W/ ANASTOMOTIC INFLAMMATORY MASS/    10-30-2000 EXPL. LAP. CONTROL POST-OP ABD. BLEEDING   EXPLORATORY LAPAROTOMY/ RESECTION OF ILEOCOLONIC ANASTOMOSIS AND CREATION OF ILEOSTOMY  11/04/2000   ABD. PERFORATION   HARDWARE REMOVAL Left 08/07/2022   Procedure: HARDWARE REMOVAL;  Surgeon: Dorna Leitz, MD;  [facility];  Service: Orthopedics;  Laterality: Left;   [place] N/A 07/23/2015   Procedure: Takedown ileostomy and repair of ostomy hernia, extensive entrolysis (2.5 hrs), ileostransverse colon anastomosis; closure of massive ventral hernia with 20 x 30 Strattice mesh;  Surgeon: Johnathan Hausen, MD;  Location: [facility];  Service: General;  Laterality: N/A;   INCISIONAL HERNIA REPAIR  07/23/2015   Strattice mesh   IRRIGATION AND DEBRIDEMENT ABSCESS Right 09/09/2014   Procedure: MINOR INCISION AND DRAINAGE OF ABSCESS;  Surgeon: Charlotte Crumb, MD;  Location: [facility];  Service: Orthopedics;  Laterality: Right;  incision and drainage right long finger    IRRIGATION AND DEBRIDEMENT ABSCESS N/A 01/03/2017   Procedure: IRRIGATION AND DEBRIDEMENT ABDOMINAL WALL ABSCESS;  Surgeon: Johnathan Hausen, MD;  [facility];  Service: General;  Laterality: N/A;   LAPAROSCOPY N/A 07/23/2015   Procedure: LAPAROSCOPY DIAGNOSTIC;  Surgeon: Johnathan Hausen, MD;  [facility];  Service: General;  Laterality: N/A;   ORIF ANKLE FRACTURE Left 08/27/2014   Procedure:  open reduction internal fixation left ankle;  Surgeon: Nita Sells, MD;  Location: [facility];  Service: Orthopedics;  Laterality: Left;  Left open reduction internal fixation ankle   RIGHT URETEROSCOPIC STONE EXTRACITON  04-24-2005  & 12-01-2002   TOTAL HIP ARTHROPLASTY Left 08/07/2022   Procedure: TOTAL HIP ARTHROPLASTY ANTERIOR APPROACH;  Surgeon: Dorna Leitz,  MD;  [facility];  Service: Orthopedics;  Laterality: Left;    Social History   Socioeconomic History   Marital status: Married    Spouse name: Not on file   Number of children: 3   Years of education: Not on file   Highest education level: Not on file  Occupational History   Not on file  Tobacco Use   Smoking status: Former    Types: Cigarettes    Quit date: 08/26/1994    Years since quitting: 28.0   Smokeless tobacco: Never  Vaping Use   Vaping Use: Never used  Substance and Sexual Activity    Alcohol use: No   Drug use: No   Sexual activity: Never  Other Topics Concern   Not on file  Social History Narrative   Not on file   Social Determinants of Health   Financial Resource Strain: Not on file  Food Insecurity: No Food Insecurity (09/10/2022)   Hunger Vital Sign    Worried About Running Out of Food in the Last Year: Never true    Ran Out of Food in the Last Year: Never true  Transportation Needs: No Transportation Needs (09/10/2022)   PRAPARE - Hydrologist (Medical): No    Lack of Transportation (Non-Medical): No  Physical Activity: Not on file  Stress: Not on file  Social Connections: Not on file  Intimate Partner Violence: Not At Risk (09/10/2022)   Humiliation, Afraid, Rape, and Kick questionnaire    Fear of Current or Ex-Partner: No    Emotionally Abused: No    Physically Abused: No    Sexually Abused: No    Family History  Problem Relation Age of Onset   Alzheimer's disease Father    Cancer Father    Cancer Sister     Current Facility-Administered Medications  Medication Dose Route Frequency Provider Last Rate Last Admin   0.9 %  sodium chloride infusion   Intravenous Continuous Kyle, Tyrone A, DO 75 mL/hr at 09/10/22 1204 New Bag at 09/10/22 1204   [START ON 09/11/2022] cefTRIAXone (ROCEPHIN) 2 g in sodium chloride 0.9 % 100 mL IVPB  2 g Intravenous Q24H Kyle, Tyrone A, DO       enoxaparin (LOVENOX) injection 40 mg  40 mg Subcutaneous Q24H Kyle, Tyrone A, DO       HYDROmorphone (DILAUDID) injection 0.5 mg  0.5 mg Intravenous Q4H PRN Marylyn Ishihara, Tyrone A, DO   0.5 mg at 09/10/22 1611   [START ON 09/11/2022] influenza vaccine adjuvanted (FLUAD) injection 0.5 mL  0.5 mL Intramuscular Tomorrow-1000 Kyle, Tyrone A, DO       insulin aspart (novoLOG) injection 0-15 Units  0-15 Units Subcutaneous TID WC Kyle, Tyrone A, DO   3 Units at 09/10/22 1723   insulin aspart (novoLOG) injection 0-5 Units  0-5 Units Subcutaneous QHS Kyle, Tyrone A,  DO       [START ON 09/11/2022] methylPREDNISolone sodium succinate (SOLU-MEDROL) 40 mg/mL injection 40 mg  40 mg Intravenous Daily Kyle, Tyrone A, DO       Oral care mouth rinse  15 mL Mouth Rinse PRN Marylyn Ishihara, Tyrone A, DO       potassium chloride SA (KLOR-CON M) CR tablet 40 mEq  40 mEq Oral Daily Kyle, Tyrone A, DO   40 mEq at 09/10/22 1231   prochlorperazine (COMPAZINE) injection 10 mg  10 mg Intravenous Q6H PRN Marylyn Ishihara, Tyrone A, DO   10 mg at 09/10/22 1149   tamsulosin (  FLOMAX) capsule 0.4 mg  0.4 mg Oral QPC supper Marylyn Ishihara, Tyrone A, DO   0.4 mg at 09/10/22 1720   tiZANidine (ZANAFLEX) tablet 2 mg  2 mg Oral Q8H PRN Marylyn Ishihara, Tyrone A, DO         Allergies  Allergen Reactions   Nsaids Other (See Comments)    Crohn's disease = NO NSAIDs    BP 138/79 (BP Location: Right Arm)   Pulse 90   Temp (!) 97.5 F (36.4 C)   Resp 19   Wt 69.6 kg   SpO2 100%   BMI 24.76 kg/m   CT Abdomen Pelvis W Contrast  Result Date: 09/10/2022 CLINICAL DATA:  Generalized abdominal pain, dominant distension, nausea, vomiting. Crohn's disease EXAM: CT ABDOMEN AND PELVIS WITH CONTRAST TECHNIQUE: Multidetector CT imaging of the abdomen and pelvis was performed using the standard protocol following bolus administration of intravenous contrast. RADIATION DOSE REDUCTION: This exam was performed according to the departmental dose-optimization program which includes automated exposure control, adjustment of the mA and/or kV according to patient size and/or use of iterative reconstruction technique. CONTRAST:  164m OMNIPAQUE IOHEXOL 300 MG/ML  SOLN COMPARISON:  07/04/2022 FINDINGS: Lower chest: No acute abnormality. Hepatobiliary: Multiple hepatic cysts again identified. No enhancing intrahepatic mass. No intra or extrahepatic biliary ductal dilation. Gallbladder unremarkable. Pancreas: Unremarkable Spleen: Unremarkable Adrenals/Urinary Tract: The adrenal glands are unremarkable. The kidneys are normal in position. Asymmetric left  renal cortical atrophy again noted. Multiple cortical hypodensities are again seen within the left kidney the largest of which are compatible with a simple cortical cyst. No follow-up imaging is recommended for these lesions. Multiple bilateral nonobstructing renal calculi are identified measuring up to 13 mm within the upper pole the left kidney. No hydronephrosis. Serial 4 and 6 mm nonobstructing calculi are seen within the distal left ureter just proximal to the left ureterovesicular junction. No ureteral calculi on the right. The bladder is unremarkable. Stomach/Bowel: Surgical changes of ileocolectomy and distal small-bowel resection are identified. There is long segment bowel wall thickening, mucosal hyperemia, and mesenteric inflammatory stranding involving the terminal small bowel extending from the small bowel anastomosis to the ileocolic anastomosis in keeping with changes of active inflammation related to the patient's underlying inflammatory bowel disease. There is, additionally, more mild relative hyperemia involving multiple small bowel loops proximal to small bowel anastomosis in keeping with additional sites of milder inflammatory change. These findings appear similar to prior examination though the extent of mild inflammatory change proximal small bowel anastomosis appears to involve a longer segment. There is no evidence of obstruction. No free intraperitoneal gas. No loculated intra-abdominal fluid collections or intra-abdominal fistula are identified. Shotty mesenteric adenopathy is again identified, likely reactive in nature. Stomach and proximal small bowel is unremarkable. Mild distal colonic diverticulosis without superimposed acute inflammatory change in this region. Vascular/Lymphatic: Aortic atherosclerosis. No enlarged abdominal or pelvic lymph nodes. Reproductive: Moderate prostatic enlargement. Other: Right lower quadrant broad-based fat containing incisional hernia related to remote  ostomy. Mild broad-based fat containing epigastric ventral hernia. Musculoskeletal: No acute bone abnormality. 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 Left total hip arthroplasty has been performed. Remote L2 superior endplate fracture noted. IMPRESSION: 1. Surgical changes of ileocolectomy and distal small-bowel resection. 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 Milder changes seen proximal to the small bowel anastomosis. These findings appear similar  to prior examination though the extent of mild inflammatory change proximal to the small bowel anastomosis appears to involve a longer segment. No evidence of obstruction, abscess, perforation or intra-abdominal fistula. 2. Mild distal colonic diverticulosis without superimposed acute inflammatory change. 3. Mild bilateral nonobstructing nephrolithiasis. Serial 4 and 6 mm nonobstructing calculi within the distal left ureter just proximal to the left ureterovesicular junction. No hydronephrosis. 4. Moderate prostatic enlargement. 5. Aortic atherosclerosis. Aortic Atherosclerosis (ICD10-I70.0). Electronically Signed   By: Fidela Salisbury M.D.   On: 09/10/2022 05:18

## 2022-09-10 NOTE — ED Provider Notes (Signed)
[facility] DEPT Provider Note   CSN: [id number] Arrival date & time: 09/09/22  1929     History  Chief Complaint  Patient presents with   Abdominal Pain    [name] is a 66 y.o. male.  66 y/o male with hx of Crohn's disease presents to the emergency department for evaluation of abdominal pain.  Reports that abdominal pain began on Tuesday with associated nausea and vomiting; however, nausea and vomiting has been more severe over the past 24 hours.  Today the patient has not been able to tolerate any food or fluids.  Reports constant, waxing and waning periumbilical pain with associated distention.  His last bowel movement was yesterday and was more constipated.  He has not been passing flatus.  No medications taken prior to arrival for symptoms.  Hx of multiple abdominal surgeries including Ex-lap x 2, terminal ileum resection, ileostomy closure, laparoscopy. Sees Dr. Alessandra Bevels of [facility].  The history is provided by the patient. No language interpreter was used.  Abdominal Pain Associated symptoms: nausea and vomiting   Associated symptoms: no fever        Home Medications Prior to Admission medications   Medication Sig Start Date End Date Taking? Authorizing Provider  aspirin EC 325 MG tablet Take 1 tablet (325 mg total) by mouth 2 (two) times daily after a meal. Take x 1 month post op to decrease risk of blood clots. 08/07/22  Yes Gary Fleet, PA-C  celecoxib (CELEBREX) 200 MG capsule Take 1 capsule (200 mg total) by mouth 2 (two) times daily. 08/07/22 08/07/23 Yes Gary Fleet, PA-C  Cholecalciferol (DIALYVITE VITAMIN D 5000) 125 MCG (5000 UT) capsule Take 5,000 Units by mouth daily.   Yes [provider]  Cyanocobalamin (B-12) 2500 MCG TABS Take 2,500 mcg by mouth daily.   Yes [provider]  docusate sodium (COLACE) 100 MG capsule Take 1 capsule (100 mg total) by mouth 2 (two) times daily. 08/07/22  Yes Gary Fleet, PA-C  mesalamine (LIALDA) 1.2 g EC tablet Take 2.4 g by mouth 2 (two) times daily. 07/07/22  Yes [provider]  metFORMIN (GLUCOPHAGE) 500 MG tablet Take 500 mg by mouth 2 (two) times daily. 05/23/21  Yes [provider]  potassium citrate (UROCIT-K) 10 MEQ (1080 MG) SR tablet Take 40 mEq by mouth 4 (four) times daily.   Yes [provider]  tadalafil (CIALIS) 20 MG tablet Take 20 mg by mouth daily as needed for erectile dysfunction. 05/04/21  Yes [provider]  tamsulosin (FLOMAX) 0.4 MG CAPS capsule Take 1 capsule (0.4 mg total) by mouth daily after supper. 01/12/17  Yes Earnstine Regal, PA-C  tiZANidine (ZANAFLEX) 2 MG tablet Take 1 tablet (2 mg total) by mouth every 8 (eight) hours as needed for muscle spasms. 08/07/22  Yes Gary Fleet, PA-C  Trospium Chloride 60 MG CP24 Take 1 capsule by mouth daily. 06/04/22  Yes [provider]  blood glucose meter kit and supplies Dispense based on patient and insurance preference. Use up to four times daily as directed. (FOR ICD-9 250.00, 250.01). 01/12/17   Earnstine Regal, PA-C  oxyCODONE-acetaminophen (PERCOCET/ROXICET) 5-325 MG tablet Take 1-2 tablets by mouth every 6 (six) hours as needed for severe pain. Patient not taking: Reported on 09/10/2022 08/07/22   Gary Fleet, PA-C      Allergies    Patient has no known allergies.    Review of Systems   Review of Systems  Constitutional:  Negative  for fever.  Gastrointestinal:  Positive for abdominal pain, nausea and vomiting.  Ten systems reviewed and are negative for acute change, except as noted in the HPI.    Physical Exam Updated Vital Signs BP 138/86   Pulse 96   Temp 98.1 F (36.7 C)   Resp 18   SpO2 96%   Physical Exam Vitals and nursing note reviewed.  Constitutional:      General: He is not in acute distress.    Appearance: He is well-developed. He is not diaphoretic.     Comments: Appears uncomfortable, nontoxic.  HENT:      Head: Normocephalic and atraumatic.     Mouth/Throat:     Comments: Cheilitis.  Eyes:     General: No scleral icterus.    Conjunctiva/sclera: Conjunctivae normal.  Cardiovascular:     Rate and Rhythm: Normal rate and regular rhythm.     Pulses: Normal pulses.  Pulmonary:     Effort: Pulmonary effort is normal. No respiratory distress.  Abdominal:     Tenderness: There is abdominal tenderness.     Comments: RLQ fistula with punctate purulence. Abdominal wall findings c/w multiple prior surgeries. Periumbilical TTP. Abdomen distended. No peritoneal signs.  Musculoskeletal:        General: Normal range of motion.     Cervical back: Normal range of motion.  Skin:    General: Skin is warm and dry.     Coloration: Skin is not pale.     Findings: No erythema or rash.  Neurological:     Mental Status: He is alert and oriented to person, place, and time.     Coordination: Coordination normal.  Psychiatric:        Behavior: Behavior normal.     ED Results / Procedures / Treatments   Labs (all labs ordered are listed, but only abnormal results are displayed) Labs Reviewed  CBC WITH DIFFERENTIAL/PLATELET - Abnormal; Notable for the following components:      Result Value   WBC 17.1 (*)    Neutro Abs 15.1 (*)    All other components within normal limits  COMPREHENSIVE METABOLIC PANEL - Abnormal; Notable for the following components:   Potassium 3.3 (*)    Glucose, Bld 180 (*)    All other components within normal limits  URINALYSIS, ROUTINE W REFLEX MICROSCOPIC - Abnormal; Notable for the following components:   Specific Gravity, Urine >1.030 (*)    Hgb urine dipstick SMALL (*)    Bilirubin Urine SMALL (*)    Ketones, ur 15 (*)    Protein, ur 30 (*)    Nitrite POSITIVE (*)    Leukocytes,Ua SMALL (*)    All other components within normal limits  URINALYSIS, MICROSCOPIC (REFLEX) - Abnormal; Notable for the following components:   Bacteria, UA MANY (*)    All other  components within normal limits  URINE CULTURE  LIPASE, BLOOD  C-REACTIVE PROTEIN  GC/CHLAMYDIA PROBE AMP ([place]) NOT AT [facility]    EKG None  Radiology CT Abdomen Pelvis W Contrast  Result Date: 09/10/2022 CLINICAL DATA:  Generalized abdominal pain, dominant distension, nausea, vomiting. Crohn's disease EXAM: CT ABDOMEN AND PELVIS WITH CONTRAST TECHNIQUE: Multidetector CT imaging of the abdomen and pelvis was performed using the standard protocol following bolus administration of intravenous contrast. RADIATION DOSE REDUCTION: This exam was performed according to the departmental dose-optimization program which includes automated exposure control, adjustment of the mA and/or kV according to patient size and/or use of iterative reconstruction technique. CONTRAST:  154m OMNIPAQUE IOHEXOL 300 MG/ML  SOLN COMPARISON:  07/04/2022 FINDINGS: Lower chest: No acute abnormality. Hepatobiliary: Multiple hepatic cysts again identified. No enhancing intrahepatic mass. No intra or extrahepatic biliary ductal dilation. Gallbladder unremarkable. Pancreas: Unremarkable Spleen: Unremarkable Adrenals/Urinary Tract: The adrenal glands are unremarkable. The kidneys are normal in position. Asymmetric left renal cortical atrophy again noted. Multiple cortical hypodensities are again seen within the left kidney the largest of which are compatible with a simple cortical cyst. No follow-up imaging is recommended for these lesions. Multiple bilateral nonobstructing renal calculi are identified measuring up to 13 mm within the upper pole the left kidney. No hydronephrosis. Serial 4 and 6 mm nonobstructing calculi are seen within the distal left ureter just proximal to the left ureterovesicular junction. No ureteral calculi on the right. The bladder is unremarkable. Stomach/Bowel: Surgical changes of ileocolectomy and distal small-bowel resection are identified. There is long segment bowel wall thickening, mucosal hyperemia,  and mesenteric inflammatory stranding involving the terminal small bowel extending from the small bowel anastomosis to the ileocolic anastomosis in keeping with changes of active inflammation related to the patient's underlying inflammatory bowel disease. There is, additionally, more mild relative hyperemia involving multiple small bowel loops proximal to small bowel anastomosis in keeping with additional sites of milder inflammatory change. These findings appear similar to prior examination though the extent of mild inflammatory change proximal small bowel anastomosis appears to involve a longer segment. There is no evidence of obstruction. No free intraperitoneal gas. No loculated intra-abdominal fluid collections or intra-abdominal fistula are identified. Shotty mesenteric adenopathy is again identified, likely reactive in nature. Stomach and proximal small bowel is unremarkable. Mild distal colonic diverticulosis without superimposed acute inflammatory change in this region. Vascular/Lymphatic: Aortic atherosclerosis. No enlarged abdominal or pelvic lymph nodes. Reproductive: Moderate prostatic enlargement. Other: Right lower quadrant broad-based fat containing incisional hernia related to remote ostomy. Mild broad-based fat containing epigastric ventral hernia. Musculoskeletal: No acute bone abnormality. 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 Left total hip arthroplasty has been performed. Remote L2 superior endplate fracture noted. IMPRESSION: 1. Surgical changes of ileocolectomy and distal small-bowel resection. 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 Milder changes seen proximal to the small bowel anastomosis. These findings appear similar to prior examination though the extent of mild inflammatory  change proximal to the small bowel anastomosis appears to involve a longer segment. No evidence of obstruction, abscess, perforation or intra-abdominal fistula. 2. Mild distal colonic diverticulosis without superimposed acute inflammatory change. 3. Mild bilateral nonobstructing nephrolithiasis. Serial 4 and 6 mm nonobstructing calculi within the distal left ureter just proximal to the left ureterovesicular junction. No hydronephrosis. 4. Moderate prostatic enlargement. 5. Aortic atherosclerosis. Aortic Atherosclerosis (ICD10-I70.0). Electronically Signed   By: AFidela SalisburyM.D.   On: 09/10/2022 05:18    Procedures Procedures    Medications Ordered in ED Medications  cefTRIAXone (ROCEPHIN) 2 g in sodium chloride 0.9 % 100 mL IVPB (has no administration in time range)  sodium chloride 0.9 % bolus 1,000 mL (0 mLs Intravenous Stopped 09/10/22 0432)  HYDROmorphone (DILAUDID) injection 1 mg (1 mg Intravenous Given 09/10/22 0315)  ondansetron (ZOFRAN) injection 4 mg (4 mg Intravenous Given 09/10/22 0315)  iohexol (OMNIPAQUE) 300 MG/ML solution 100 mL (100 mLs Intravenous Contrast Given 09/10/22 0416)  sodium chloride 0.9 % bolus 1,000 mL (1,000 mLs Intravenous New Bag/Given 09/10/22  5329)  methylPREDNISolone sodium succinate (SOLU-MEDROL) 40 mg/mL injection 40 mg (40 mg Intravenous Given 09/10/22 0625)  HYDROmorphone (DILAUDID) injection 0.5 mg (0.5 mg Intravenous Given 09/10/22 9242)    ED Course/ Medical Decision Making/ A&P                           Medical Decision Making Risk Prescription drug management.   This patient presents to the ED for concern of abdominal pain and vomiting, this involves an extensive number of treatment options, and is a complaint that carries with it a high risk of complications and morbidity.  The differential diagnosis includes viral illness vs Crohn's flare vs pSBO vs SBO vs intraabdominal abscess vs ruptured viscous   Co morbidities that complicate the  patient evaluation  Crohn's   Additional history obtained:  External records from outside source obtained and reviewed including outpatient surgical visit/follow up from March 2023   Lab Tests:  I Ordered, and personally interpreted labs.  The pertinent results include:  Leukocytosis of 17.1. Urinalysis c/w UTI. Mild hypokalemia of 3.3   Imaging Studies ordered:  I ordered imaging studies including CT abdomen pelvis  I independently visualized and interpreted imaging which showed evidence of Crohn's flare without intraabdominal abscess or obstruction I agree with the radiologist interpretation   Cardiac Monitoring:  The patient was maintained on a cardiac monitor.  I personally viewed and interpreted the cardiac monitored which showed an underlying rhythm of: NSR   Medicines ordered and prescription drug management:  I ordered medication including Dilaudid for pain, Zofran for nausea, and Solumedrol for management of Crohn's flare.  Reevaluation of the patient after these medicines showed that the patient stayed the same I have reviewed the patients home medicines and have made adjustments as needed   Test Considered:  ESR   Consultations Obtained:  I requested consultation of [facility]. Dr. Michail Sermon notified via secure chat of the need to see the patient in consultation during his admission   Reevaluation:  After the interventions noted above, I reevaluated the patient and found that they have :stayed the same   Social Determinants of Health:  Insured patient   Dispostion:  After consideration of the diagnostic results and the patients response to treatment, I feel that the patent would benefit from admission for management of acute Crohn's flare and acute cystitis. Started on IV solumedrol and IV Rocephin. Urine culture pending. Case discussed with [facility] of [facility]; their service will assess the patient this AM. [facility] with plans to consult on patient's case  during his admission.         Final Clinical Impression(s) / ED Diagnoses Final diagnoses:  Crohn's disease of intestine without complication ([place])  Acute cystitis without hematuria    Rx / DC Orders ED Discharge Orders     None         Antonietta Breach, PA-C 09/10/22 6834    Veryl Speak, MD 09/10/22 [phone]

## 2022-09-11 DIAGNOSIS — K50013 Crohn's disease of small intestine with fistula: Secondary | ICD-10-CM

## 2022-09-11 LAB — GLUCOSE, CAPILLARY
Glucose-Capillary: 189 mg/dL — ABNORMAL HIGH (ref 70–99)
Glucose-Capillary: 140 mg/dL — ABNORMAL HIGH (ref 70–99)
Glucose-Capillary: 163 mg/dL — ABNORMAL HIGH (ref 70–99)
Glucose-Capillary: 210 mg/dL — ABNORMAL HIGH (ref 70–99)

## 2022-09-11 LAB — GC/CHLAMYDIA PROBE AMP (~~LOC~~) NOT AT ARMC
Chlamydia: NEGATIVE
Comment: NEGATIVE
Comment: NORMAL
Neisseria Gonorrhea: NEGATIVE

## 2022-09-11 LAB — PREALBUMIN: Prealbumin: 15 mg/dL — ABNORMAL LOW (ref 18–38)

## 2022-09-11 LAB — COMPREHENSIVE METABOLIC PANEL
ALT: 9 U/L (ref 0–44)
AST: 13 U/L — ABNORMAL LOW (ref 15–41)
Albumin: 2.7 g/dL — ABNORMAL LOW (ref 3.5–5.0)
Alkaline Phosphatase: 66 U/L (ref 38–126)
Anion gap: 6 (ref 5–15)
BUN: 18 mg/dL (ref 8–23)
CO2: 22 mmol/L (ref 22–32)
Calcium: 8.4 mg/dL — ABNORMAL LOW (ref 8.9–10.3)
Chloride: 111 mmol/L (ref 98–111)
Creatinine, Ser: 0.91 mg/dL (ref 0.61–1.24)
GFR, Estimated: >60 mL/min (ref >60)
Glucose, Bld: 235 mg/dL — ABNORMAL HIGH (ref 70–99)
Potassium: 4.7 mmol/L (ref 3.5–5.1)
Sodium: 139 mmol/L (ref 135–145)
Total Bilirubin: 0.4 mg/dL (ref 0.3–1.2)
Total Protein: 6.4 g/dL — ABNORMAL LOW (ref 6.5–8.1)

## 2022-09-11 LAB — CBC
HCT: 32.8 % — ABNORMAL LOW (ref 39.0–52.0)
Hemoglobin: 10 g/dL — ABNORMAL LOW (ref 13.0–17.0)
MCH: 26.3 pg (ref 26.0–34.0)
MCHC: 30.5 g/dL (ref 30.0–36.0)
MCV: 86.3 fL (ref 80.0–100.0)
Platelets: 200 10*3/uL (ref 150–400)
RBC: 3.8 MIL/uL — ABNORMAL LOW (ref 4.22–5.81)
RDW: 14.9 % (ref 11.5–15.5)
WBC: 4.7 10*3/uL (ref 4.0–10.5)
nRBC: 0 % (ref 0.0–0.2)

## 2022-09-11 MED ORDER — METHYLPREDNISOLONE SODIUM SUCC 40 MG IJ SOLR
20.0000 mg | Freq: Three times a day (TID) | INTRAMUSCULAR | Status: DC
  Administered 2022-09-11 – 2022-09-13 (×6): 20 mg via INTRAVENOUS
  Filled 2022-09-11 (×6): qty 1

## 2022-09-11 NOTE — Progress Notes (Signed)
[facility] Gastroenterology Progress Note  [name] 66 y.o. Sep 27, 1956    Subjective: Patient seen and examined sitting in bed.  Reports abdominal pain is improved from yesterday.  Has loose colostomy output, nonbloody.  Continues to have some pain in the abdomen and right upper quadrant.  Tolerating full liquid diet well.    Objective: Vital signs in last 24 hours: Vitals:   09/11/22 0629 09/11/22 1232  BP: (!) 124/91 (!) 142/84  Pulse: 63 100  Resp: 17 18  Temp: (!) 97.5 F (36.4 C) 97.7 F (36.5 C)  SpO2: 99% 99%    Physical Exam:  General:  Alert, cooperative, no distress, appears stated age, chronically ill-appearing  Head:  Normocephalic, without obvious abnormality, atraumatic  Eyes:  Anicteric sclera, EOM's intact  Lungs:   Clear to auscultation bilaterally, respirations unlabored  Heart:  Regular rate and rhythm, S1, S2 normal  Abdomen:   Soft, tender to the mid abdomen and upper quadrant, bowel sounds sluggish,  no masses, ostomy pouch in place    Lab Results: Recent Labs    09/10/22 1250 09/11/22 0015  NA 139 139  K 4.1 4.7  CL 108 111  CO2 24 22  GLUCOSE 187* 235*  BUN 19 18  CREATININE 0.97 0.91  CALCIUM 7.9* 8.4*  MG 1.8  --    Recent Labs    09/10/22 1250 09/11/22 0015  AST 15 13*  ALT 10 9  ALKPHOS 98 66  BILITOT 0.5 0.4  PROT 6.7 6.4*  ALBUMIN 3.0* 2.7*   Recent Labs    09/09/22 1954 09/10/22 1250 09/11/22 0015  WBC 17.1* 5.4 4.7  NEUTROABS 15.1* 4.9  --   HGB 13.0 10.9* 10.0*  HCT 41.8 36.0* 32.8*  MCV 85.1 87.6 86.3  PLT 303 195 200   No results for input(s): "LABPROT", "INR" in the last 72 hours.    Assessment Crohn's disease flare -patient with findings of inflammation in the distal small bowel proximal to ileocolonic anastomosis, pain improved with Solu-Medrol 40 mg twice daily.  Tolerating full liquid diet well.  To start Humira as outpatient, continue with plan. Abdominal pain -improving   Plan: Start  Solu-Medrol to 20 mg IV every 8 hours. Advance to soft diet Continue pain control as needed [facility] will follow  Charlott Rakes PA-C 09/11/2022, 1:01 PM  Contact #  [phone]

## 2022-09-11 NOTE — Progress Notes (Signed)
Mobility Specialist - Progress Note   09/11/22 1712  Mobility  Activity Ambulated with assistance in hallway  Level of Assistance Standby assist, set-up cues, supervision of patient - no hands on  Assistive Device Cane  Distance Ambulated (ft) 1050 ft  Range of Motion/Exercises Active  Activity Response Tolerated well  Mobility Referral Yes  $Mobility charge 1 Mobility   Pt was found in bed and agreeable to ambulate. Had no complaints and at EOS returned to bed with all necessities in reach and NT in room.  Ferd Hibbs Mobility Specialist

## 2022-09-11 NOTE — Progress Notes (Signed)
PROGRESS NOTE    [name]  PYK:[id number] DOB: 1956-02-16 DOA: 09/09/2022 PCP: [name], MD    Brief Narrative:  Patient is 66 year old gentleman with history of Crohn's disease and multiple intra-abdominal surgeries, right lower quadrant draining fistula, type 2 diabetes presented to the ER with 5 days of right lower quadrant and left lower quadrant abdominal pain and distention, he was feeling distention of his incisional hernia.  In the emergency room, hemodynamically stable but found to have active inflammation of the ileocolonic anastomosis so admitted as Crohn's exacerbation with surgery and GI following.   Assessment & Plan:   Crohn's disease with exacerbation: Tolerating full liquid diet, adequate pain medications, started on high-dose IV steroids and followed by gastroenterology. Seen by surgery, currently unable to offer any surgery.  Will need ongoing follow-up.  Mobilize. Started on Rocephin and Flagyl since admission, will continue today pending blood cultures and urine cultures.  Less likely infected. Local wound care, Eakin's pouch for the right lower extremity fistula. Anticipate home with prolonged steroid therapy and outpatient GI follow-up.  Hypokalemia: Replaced.  Adequate.  Type 2 diabetes: Covering with sliding scale insulin.  Anticipate worsening with use of steroids.  He is only on metformin at home.  BPH: Resume Flomax.   DVT prophylaxis: enoxaparin (LOVENOX) injection 40 mg Start: 09/10/22 2200   Code Status: Full code Family Communication: None at the bedside Disposition Plan: Status is: Inpatient Remains inpatient appropriate because: IV steroids, IV fluids, electrolytes     Consultants:  General surgery Gastroenterology  Procedures:  None  Antimicrobials:  Rocephin and Flagyl 10/29--   Subjective: Patient was seen and examined.  On my exam, he denies any complaints other than some abdominal discomfort but not to the extent of  needing pain medication.  Denies any nausea or vomiting.  Eating and drinking full liquids without any issues.  Objective: Vitals:   09/10/22 1532 09/10/22 1900 09/10/22 2140 09/11/22 0629  BP: 138/79  126/71 (!) 124/91  Pulse: 90  65 63  Resp: '19  14 17  '$ Temp: (!) 97.5 F (36.4 C)  97.7 F (36.5 C) (!) 97.5 F (36.4 C)  TempSrc:   Oral Oral  SpO2: 100%  100% 99%  Weight:      Height:  '5\' 6"'$  (1.676 m)      Intake/Output Summary (Last 24 hours) at 09/11/2022 1142 Last data filed at 09/11/2022 0513 Gross per 24 hour  Intake 1555.03 ml  Output 775 ml  Net 780.03 ml   Filed Weights   09/10/22 1203  Weight: 69.6 kg    Examination:  General exam: Appears calm and comfortable  Respiratory system: Clear to auscultation. Respiratory effort normal.  On room air. Cardiovascular system: S1 & S2 heard, RRR. No pedal edema. Gastrointestinal system: Soft.  Multiple abdominal scars look healthy.  He does have incisional hernia below the umbilicus that looks uncomplicated.  Mild generalized tenderness present.  Right lower quadrant fistula with loose stool into a patch. Central nervous system: Alert and oriented. No focal neurological deficits. Extremities: Symmetric 5 x 5 power. Skin: No rashes, lesions or ulcers Psychiatry: Judgement and insight appear normal. Mood & affect appropriate.     Data Reviewed: I have personally reviewed following labs and imaging studies  CBC: Recent Labs  Lab 09/09/22 1954 09/10/22 1250 09/11/22 0015  WBC 17.1* 5.4 4.7  NEUTROABS 15.1* 4.9  --   HGB 13.0 10.9* 10.0*  HCT 41.8 36.0* 32.8*  MCV 85.1 87.6 86.3  PLT  303 195 353   Basic Metabolic Panel: Recent Labs  Lab 09/09/22 1954 09/10/22 1250 09/11/22 0015  NA 141 139 139  K 3.3* 4.1 4.7  CL 105 108 111  CO2 '22 24 22  '$ GLUCOSE 180* 187* 235*  BUN '19 19 18  '$ CREATININE 1.17 0.97 0.91  CALCIUM 9.6 7.9* 8.4*  MG  --  1.8  --    GFR: Estimated Creatinine Clearance: 71.1 mL/min (by  C-G formula based on SCr of 0.91 mg/dL). Liver Function Tests: Recent Labs  Lab 09/09/22 1954 09/10/22 1250 09/11/22 0015  AST 19 15 13*  ALT '10 10 9  '$ ALKPHOS 89 98 66  BILITOT 0.6 0.5 0.4  PROT 7.8 6.7 6.4*  ALBUMIN 3.7 3.0* 2.7*   Recent Labs  Lab 09/09/22 1954  LIPASE 28   No results for input(s): "AMMONIA" in the last 168 hours. Coagulation Profile: No results for input(s): "INR", "PROTIME" in the last 168 hours. Cardiac Enzymes: No results for input(s): "CKTOTAL", "CKMB", "CKMBINDEX", "TROPONINI" in the last 168 hours. BNP (last 3 results) No results for input(s): "PROBNP" in the last 8760 hours. HbA1C: No results for input(s): "HGBA1C" in the last 72 hours. CBG: Recent Labs  Lab 09/10/22 1639 09/10/22 2150 09/11/22 0809  GLUCAP 200* 165* 189*   Lipid Profile: No results for input(s): "CHOL", "HDL", "LDLCALC", "TRIG", "CHOLHDL", "LDLDIRECT" in the last 72 hours. Thyroid Function Tests: No results for input(s): "TSH", "T4TOTAL", "FREET4", "T3FREE", "THYROIDAB" in the last 72 hours. Anemia Panel: No results for input(s): "VITAMINB12", "FOLATE", "FERRITIN", "TIBC", "IRON", "RETICCTPCT" in the last 72 hours. Sepsis Labs: No results for input(s): "PROCALCITON", "LATICACIDVEN" in the last 168 hours.  Recent Results (from the past 240 hour(s))  Urine Culture     Status: Abnormal (Preliminary result)   Collection Time: 09/10/22  6:41 AM   Specimen: Urine, Clean Catch  Result Value Ref Range Status   Specimen Description   Final    URINE, CLEAN CATCH Performed at [facility], [place] [address], [place], [place] [address]    Special Requests   Final    NONE Performed at [facility], [place] [address] [place], [place] [address]    Culture (A)  Final    >=100,000 COLONIES/mL ENTEROBACTER AEROGENES CULTURE REINCUBATED FOR BETTER GROWTH SUSCEPTIBILITIES TO FOLLOW Performed at [facility], [place] [address] [place],  [place] [address]    Report Status PENDING  Incomplete         Radiology Studies: CT Abdomen Pelvis W Contrast  Result Date: 09/10/2022 CLINICAL DATA:  Generalized abdominal pain, dominant distension, nausea, vomiting. Crohn's disease EXAM: CT ABDOMEN AND PELVIS WITH CONTRAST TECHNIQUE: Multidetector CT imaging of the abdomen and pelvis was performed using the standard protocol following bolus administration of intravenous contrast. RADIATION DOSE REDUCTION: This exam was performed according to the departmental dose-optimization program which includes automated exposure control, adjustment of the mA and/or kV according to patient size and/or use of iterative reconstruction technique. CONTRAST:  124m OMNIPAQUE IOHEXOL 300 MG/ML  SOLN COMPARISON:  07/04/2022 FINDINGS: Lower chest: No acute abnormality. Hepatobiliary: Multiple hepatic cysts again identified. No enhancing intrahepatic mass. No intra or extrahepatic biliary ductal dilation. Gallbladder unremarkable. Pancreas: Unremarkable Spleen: Unremarkable Adrenals/Urinary Tract: The adrenal glands are unremarkable. The kidneys are normal in position. Asymmetric left renal cortical atrophy again noted. Multiple cortical hypodensities are again seen within the left kidney the largest of which are compatible with a simple cortical cyst. No follow-up imaging is recommended  for these lesions. Multiple bilateral nonobstructing renal calculi are identified measuring up to 13 mm within the upper pole the left kidney. No hydronephrosis. Serial 4 and 6 mm nonobstructing calculi are seen within the distal left ureter just proximal to the left ureterovesicular junction. No ureteral calculi on the right. The bladder is unremarkable. Stomach/Bowel: Surgical changes of ileocolectomy and distal small-bowel resection are identified. There is long segment bowel wall thickening, mucosal hyperemia, and mesenteric inflammatory stranding involving the terminal small bowel extending  from the small bowel anastomosis to the ileocolic anastomosis in keeping with changes of active inflammation related to the patient's underlying inflammatory bowel disease. There is, additionally, more mild relative hyperemia involving multiple small bowel loops proximal to small bowel anastomosis in keeping with additional sites of milder inflammatory change. These findings appear similar to prior examination though the extent of mild inflammatory change proximal small bowel anastomosis appears to involve a longer segment. There is no evidence of obstruction. No free intraperitoneal gas. No loculated intra-abdominal fluid collections or intra-abdominal fistula are identified. Shotty mesenteric adenopathy is again identified, likely reactive in nature. Stomach and proximal small bowel is unremarkable. Mild distal colonic diverticulosis without superimposed acute inflammatory change in this region. Vascular/Lymphatic: Aortic atherosclerosis. No enlarged abdominal or pelvic lymph nodes. Reproductive: Moderate prostatic enlargement. Other: Right lower quadrant broad-based fat containing incisional hernia related to remote ostomy. Mild broad-based fat containing epigastric ventral hernia. Musculoskeletal: No acute bone abnormality. 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 Left total hip arthroplasty has been performed. Remote L2 superior endplate fracture noted. IMPRESSION: 1. Surgical changes of ileocolectomy and distal small-bowel resection. 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 Milder changes seen proximal to the small bowel anastomosis. These findings appear similar to prior examination though the extent of mild inflammatory change proximal to the small bowel anastomosis appears to involve a longer segment. No  evidence of obstruction, abscess, perforation or intra-abdominal fistula. 2. Mild distal colonic diverticulosis without superimposed acute inflammatory change. 3. Mild bilateral nonobstructing nephrolithiasis. Serial 4 and 6 mm nonobstructing calculi within the distal left ureter just proximal to the left ureterovesicular junction. No hydronephrosis. 4. Moderate prostatic enlargement. 5. Aortic atherosclerosis. Aortic Atherosclerosis (ICD10-I70.0). Electronically Signed   By: Fidela Salisbury M.D.   On: 09/10/2022 05:18        Scheduled Meds:  enoxaparin (LOVENOX) injection  40 mg Subcutaneous Q24H   influenza vaccine adjuvanted  0.5 mL Intramuscular Tomorrow-1000   insulin aspart  0-15 Units Subcutaneous TID WC   insulin aspart  0-5 Units Subcutaneous QHS   methylPREDNISolone (SOLU-MEDROL) injection  20 mg Intravenous Q8H   potassium chloride  40 mEq Oral Daily   tamsulosin  0.4 mg Oral QPC supper   Continuous Infusions:  sodium chloride 75 mL/hr at 09/10/22 2232   cefTRIAXone (ROCEPHIN)  IV 2 g (09/11/22 0914)     LOS: 1 day    Time spent: 35 minutes    Barb Merino, MD [facility] Pager [phone]

## 2022-09-11 NOTE — Progress Notes (Signed)
Mobility Specialist - Progress Note   09/11/22 1227  Mobility  Activity Ambulated with assistance in hallway  Level of Assistance Standby assist, set-up cues, supervision of patient - no hands on  Assistive Device Cane  Distance Ambulated (ft) 1050 ft  Range of Motion/Exercises Active  Activity Response Tolerated well  Mobility Referral Yes  $Mobility charge 1 Mobility   Pt was found in bed and agreeable to ambulate. Once getting up from bed stated feeling dizzy but stated it going away after standing for a minute. Had no complaints during ambulation and at EOS returned to EOB with NT in room.  Ferd Hibbs Mobility Specialist

## 2022-09-11 NOTE — Consult Note (Signed)
[place] Nurse fistula consult note Fistula type/location: enterocutaneous fistula; RLQ, previous ileostomy Chron's flare  Perifistula assessment: intact Treatment options for perifistula skin: no sting skin prep; skin barrier ring around fistula opening  Output liquid light brown Fistula pouching: small Eakin, enlarged starter hole just a small bit to clear fistula opening  Education provided: discussed use of Eakin and rationale for use; did make patient aware these pouches unlike his ostomy pouches are not routinely covered by insurance.  He would not qualify for ostomy pouches again because he no longer has an ostomy and he donated all of his extra supplies after his takedown. Will need to navigate his supply needs once he is more stable and pending DC. 2 small Eakin pouches ordered for patient's room  Will have [place] nursing team check on patient status later this week. [place] nursing team will follow along for support with fistula care and teaching  [place], [place], [place]

## 2022-09-11 NOTE — Progress Notes (Signed)
Subjective: Patient feels ok this morning.  Tolerating full liquids.  Output from fistula has increased since his surgery on his hip.  ROS: See above, otherwise other systems negative  Objective: Vital signs in last 24 hours: Temp:  [97.5 F (36.4 C)-97.7 F (36.5 C)] 97.5 F (36.4 C) (10/30 0629) Pulse Rate:  [63-90] 63 (10/30 0629) Resp:  [14-19] 17 (10/30 0629) BP: (124-138)/(71-91) 124/91 (10/30 0629) SpO2:  [99 %-100 %] 99 % (10/30 0629) Weight:  [69.6 kg] 69.6 kg (10/29 1203) Last BM Date : 09/11/22  Intake/Output from previous day: 10/29 0701 - 10/30 0700 In: 2555 [P.O.:360; I.V.:1195; IV Piggyback:1000] Out: 1025 [Urine:550] Intake/Output this shift: No intake/output data recorded.  PE: Abd: soft, minimally tender, ostomy pouch in place with some liquid succus present in bag along with air.  Lab Results:  Recent Labs    09/10/22 1250 09/11/22 0015  WBC 5.4 4.7  HGB 10.9* 10.0*  HCT 36.0* 32.8*  PLT 195 200   BMET Recent Labs    09/10/22 1250 09/11/22 0015  NA 139 139  K 4.1 4.7  CL 108 111  CO2 24 22  GLUCOSE 187* 235*  BUN 19 18  CREATININE 0.97 0.91  CALCIUM 7.9* 8.4*   PT/INR No results for input(s): "LABPROT", "INR" in the last 72 hours. CMP     Component Value Date/Time   NA 139 09/11/2022 0015   K 4.7 09/11/2022 0015   CL 111 09/11/2022 0015   CO2 22 09/11/2022 0015   GLUCOSE 235 (H) 09/11/2022 0015   BUN 18 09/11/2022 0015   CREATININE 0.91 09/11/2022 0015   CALCIUM 8.4 (L) 09/11/2022 0015   PROT 6.4 (L) 09/11/2022 0015   ALBUMIN 2.7 (L) 09/11/2022 0015   AST 13 (L) 09/11/2022 0015   ALT 9 09/11/2022 0015   ALKPHOS 66 09/11/2022 0015   BILITOT 0.4 09/11/2022 0015   GFRNONAA >60 09/11/2022 0015   GFRAA >60 01/12/2017 1239   Lipase     Component Value Date/Time   LIPASE 28 09/09/2022 1954       Studies/Results: CT Abdomen Pelvis W Contrast  Result Date: 09/10/2022 CLINICAL DATA:  Generalized abdominal pain,  dominant distension, nausea, vomiting. Crohn's disease EXAM: CT ABDOMEN AND PELVIS WITH CONTRAST TECHNIQUE: Multidetector CT imaging of the abdomen and pelvis was performed using the standard protocol following bolus administration of intravenous contrast. RADIATION DOSE REDUCTION: This exam was performed according to the departmental dose-optimization program which includes automated exposure control, adjustment of the mA and/or kV according to patient size and/or use of iterative reconstruction technique. CONTRAST:  154m OMNIPAQUE IOHEXOL 300 MG/ML  SOLN COMPARISON:  07/04/2022 FINDINGS: Lower chest: No acute abnormality. Hepatobiliary: Multiple hepatic cysts again identified. No enhancing intrahepatic mass. No intra or extrahepatic biliary ductal dilation. Gallbladder unremarkable. Pancreas: Unremarkable Spleen: Unremarkable Adrenals/Urinary Tract: The adrenal glands are unremarkable. The kidneys are normal in position. Asymmetric left renal cortical atrophy again noted. Multiple cortical hypodensities are again seen within the left kidney the largest of which are compatible with a simple cortical cyst. No follow-up imaging is recommended for these lesions. Multiple bilateral nonobstructing renal calculi are identified measuring up to 13 mm within the upper pole the left kidney. No hydronephrosis. Serial 4 and 6 mm nonobstructing calculi are seen within the distal left ureter just proximal to the left ureterovesicular junction. No ureteral calculi on the right. The bladder is unremarkable. Stomach/Bowel: Surgical changes of ileocolectomy and distal small-bowel resection are identified.  There is long segment bowel wall thickening, mucosal hyperemia, and mesenteric inflammatory stranding involving the terminal small bowel extending from the small bowel anastomosis to the ileocolic anastomosis in keeping with changes of active inflammation related to the patient's underlying inflammatory bowel disease. There is,  additionally, more mild relative hyperemia involving multiple small bowel loops proximal to small bowel anastomosis in keeping with additional sites of milder inflammatory change. These findings appear similar to prior examination though the extent of mild inflammatory change proximal small bowel anastomosis appears to involve a longer segment. There is no evidence of obstruction. No free intraperitoneal gas. No loculated intra-abdominal fluid collections or intra-abdominal fistula are identified. Shotty mesenteric adenopathy is again identified, likely reactive in nature. Stomach and proximal small bowel is unremarkable. Mild distal colonic diverticulosis without superimposed acute inflammatory change in this region. Vascular/Lymphatic: Aortic atherosclerosis. No enlarged abdominal or pelvic lymph nodes. Reproductive: Moderate prostatic enlargement. Other: Right lower quadrant broad-based fat containing incisional hernia related to remote ostomy. Mild broad-based fat containing epigastric ventral hernia. Musculoskeletal: No acute bone abnormality. 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 Left total hip arthroplasty has been performed. Remote L2 superior endplate fracture noted. IMPRESSION: 1. Surgical changes of ileocolectomy and distal small-bowel resection. 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 Milder changes seen proximal to the small bowel anastomosis. These findings appear similar to prior examination though the extent of mild inflammatory change proximal to the small bowel anastomosis appears to involve a longer segment. No evidence of obstruction, abscess, perforation or intra-abdominal fistula. 2. Mild distal colonic diverticulosis without superimposed acute inflammatory change. 3. Mild bilateral  nonobstructing nephrolithiasis. Serial 4 and 6 mm nonobstructing calculi within the distal left ureter just proximal to the left ureterovesicular junction. No hydronephrosis. 4. Moderate prostatic enlargement. 5. Aortic atherosclerosis. Aortic Atherosclerosis (ICD10-I70.0). Electronically Signed   By: Fidela Salisbury M.D.   On: 09/10/2022 05:18    Anti-infectives: Anti-infectives (From admission, onward)    Start     Dose/Rate Route Frequency Ordered Stop   09/11/22 1000  cefTRIAXone (ROCEPHIN) 2 g in sodium chloride 0.9 % 100 mL IVPB        2 g 200 mL/hr over 30 Minutes Intravenous Every 24 hours 09/10/22 1137     09/10/22 0645  cefTRIAXone (ROCEPHIN) 2 g in sodium chloride 0.9 % 100 mL IVPB        2 g 200 mL/hr over 30 Minutes Intravenous  Once 09/10/22 7616 09/10/22 0726        Assessment/Plan Crohn's disease with fistula  -Open ileocolectomy for Crohn's disease at [facility]. 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  Ileo-ascending colon anastomosis 2001. -Takeback, washout, with creation of end ileostomy 2001 -Laparoscopic converted to open lysis adhesions with takedown of ileostomy.  Small bowel to transverse colon anastomosis, closure of parastomal hernia, complex abdominal repair with 30 x 20 cm Strattice biologic mesh 07/23/2015. -Incision and drainage of right-sided abdominal wall abscess with strong evidence of colocutaneous fistula 01/03/2017.  -drainage controlled with ostomy pouch -on full liquids -started on steroids by GI.  Will allow steroids and further tx recommendations per GI to help with crohn's flare and have patient follow up with Dr. Hassell Done as outpatient for further fistula management as long as he remains stable while here.  FEN - FLD VTE - Lovenox ID -  Rocephin/Flagyl  I reviewed Consultant GI notes, hospitalist notes, last 24 h vitals and pain scores, last 48 h intake and output, last 24 h labs and  trends, and last 24 h imaging results.   LOS: 1 day    Henreitta Cea , [facility] Surgery 09/11/2022, 11:11 AM Please see Amion for pager number during day hours 7:00am-4:30pm or 7:00am -11:30am on weekends

## 2022-09-12 DIAGNOSIS — K50013 Crohn's disease of small intestine with fistula: Secondary | ICD-10-CM | POA: Diagnosis not present

## 2022-09-12 LAB — GLUCOSE, CAPILLARY
Glucose-Capillary: 183 mg/dL — ABNORMAL HIGH (ref 70–99)
Glucose-Capillary: 219 mg/dL — ABNORMAL HIGH (ref 70–99)
Glucose-Capillary: 209 mg/dL — ABNORMAL HIGH (ref 70–99)
Glucose-Capillary: 224 mg/dL — ABNORMAL HIGH (ref 70–99)

## 2022-09-12 LAB — C DIFFICILE QUICK SCREEN W PCR REFLEX
C Diff antigen: NEGATIVE — AB
C Diff interpretation: NEGATIVE
C Diff toxin: NEGATIVE — AB

## 2022-09-12 LAB — URINE CULTURE: Culture: >=100000 — AB

## 2022-09-12 MED ORDER — STERILE WATER FOR INJECTION IJ SOLN
INTRAMUSCULAR | Status: AC
  Administered 2022-09-12: 0.5 mL
  Filled 2022-09-12: qty 10

## 2022-09-12 MED ORDER — SODIUM CHLORIDE 0.9 % IV SOLN
2.0000 g | Freq: Three times a day (TID) | INTRAVENOUS | Status: DC
  Administered 2022-09-12 – 2022-09-13 (×3): 2 g via INTRAVENOUS
  Filled 2022-09-12 (×3): qty 12.5

## 2022-09-12 NOTE — Progress Notes (Signed)
Pharmacy Antibiotic Note  [name] is a 66 y.o. male admitted on 09/09/2022 with UTI.  Pharmacy has been consulted for cefepime dosing. Medication being changed from ceftriaxone to cefepime after resulting urine culture   Plan: Cefepime 2 gr IV q8h   Height: '5\' 6"'$  (167.6 cm) Weight: 69.6 kg (153 lb 6 oz) IBW/kg (Calculated) : 63.8  Temp (24hrs), Avg:97.9 F (36.6 C), Min:97.7 F (36.5 C), Max:98.2 F (36.8 C)  Recent Labs  Lab 09/09/22 1954 09/10/22 1250 09/11/22 0015  WBC 17.1* 5.4 4.7  CREATININE 1.17 0.97 0.91    Estimated Creatinine Clearance: 71.1 mL/min (by C-G formula based on SCr of 0.91 mg/dL).    Allergies  Allergen Reactions   Nsaids Other (See Comments)    Crohn's disease = NO NSAIDs    Antimicrobials this admission: 10/30  ceftriaxone >> 10/31 10/31 cefepime >>   Dose adjustments this admission:   Microbiology results: 10/29 UCx: E. Aerogenes ( R to cefazolin, Ceftriaxone, and nitrofuantoin)   10/31 C. Diff negative   Thank you for allowing pharmacy to be a part of this patient's care.   Royetta Asal, PharmD, BCPS 09/12/2022 8:04 AM

## 2022-09-12 NOTE — Progress Notes (Signed)
Mobility Specialist - Progress Note   09/12/22 0949  Mobility  Activity Ambulated with assistance in hallway  Level of Assistance Modified independent, requires aide device or extra time  Assistive Device [facility] Ambulated (ft) 1400 ft  Range of Motion/Exercises Active  Activity Response Tolerated well  Mobility Referral Yes  $Mobility charge 1 Mobility   Pt was found in bed and agreeable to ambulate. Had no complaints during ambulation and at EOS returned to sit EOB with all necessities in reach.  Ferd Hibbs Mobility Specialist

## 2022-09-12 NOTE — Progress Notes (Signed)
Subjective: Doing well.  Feels somewhat improved since steroids started.   ROS: See above, otherwise other systems negative  Objective: Vital signs in last 24 hours: Temp:  [97.8 F (36.6 C)-98.2 F (36.8 C)] 98.2 F (36.8 C) (10/31 0609) Pulse Rate:  [54-70] 54 (10/31 0609) Resp:  [18] 18 (10/31 0609) BP: (146-154)/(75-77) 154/75 (10/31 0609) SpO2:  [98 %-99 %] 98 % (10/31 0609) Last BM Date : 09/11/22  Intake/Output from previous day: 10/30 0701 - 10/31 0700 In: 1831.7 [P.O.:1220; I.V.:611.7] Out: 700 [Urine:700] Intake/Output this shift: Total I/O In: -  Out: 600 [Urine:600]  PE: Abd: soft, minimally tender, ostomy pouch in place with some liquid succus present in bag along with air.  Lab Results:  Recent Labs    09/10/22 1250 09/11/22 0015  WBC 5.4 4.7  HGB 10.9* 10.0*  HCT 36.0* 32.8*  PLT 195 200    BMET Recent Labs    09/10/22 1250 09/11/22 0015  NA 139 139  K 4.1 4.7  CL 108 111  CO2 24 22  GLUCOSE 187* 235*  BUN 19 18  CREATININE 0.97 0.91  CALCIUM 7.9* 8.4*    PT/INR No results for input(s): "LABPROT", "INR" in the last 72 hours. CMP     Component Value Date/Time   NA 139 09/11/2022 0015   K 4.7 09/11/2022 0015   CL 111 09/11/2022 0015   CO2 22 09/11/2022 0015   GLUCOSE 235 (H) 09/11/2022 0015   BUN 18 09/11/2022 0015   CREATININE 0.91 09/11/2022 0015   CALCIUM 8.4 (L) 09/11/2022 0015   PROT 6.4 (L) 09/11/2022 0015   ALBUMIN 2.7 (L) 09/11/2022 0015   AST 13 (L) 09/11/2022 0015   ALT 9 09/11/2022 0015   ALKPHOS 66 09/11/2022 0015   BILITOT 0.4 09/11/2022 0015   GFRNONAA >60 09/11/2022 0015   GFRAA >60 01/12/2017 1239   Lipase     Component Value Date/Time   LIPASE 28 09/09/2022 1954       Studies/Results: No results found.  Anti-infectives: Anti-infectives (From admission, onward)    Start     Dose/Rate Route Frequency Ordered Stop   09/12/22 0845  ceFEPIme (MAXIPIME) 2 g in sodium chloride 0.9 % 100 mL  IVPB        2 g 200 mL/hr over 30 Minutes Intravenous Every 8 hours 09/12/22 0752     09/11/22 1000  cefTRIAXone (ROCEPHIN) 2 g in sodium chloride 0.9 % 100 mL IVPB  Status:  Discontinued        2 g 200 mL/hr over 30 Minutes Intravenous Every 24 hours 09/10/22 1137 09/12/22 0748   09/10/22 0645  cefTRIAXone (ROCEPHIN) 2 g in sodium chloride 0.9 % 100 mL IVPB        2 g 200 mL/hr over 30 Minutes Intravenous  Once 09/10/22 2542 09/10/22 0726        Assessment/Plan Crohn's disease with fistula  -Open ileocolectomy for Crohn's disease at [facility] 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  Ileo-ascending colon anastomosis 2001. -Takeback, washout, with creation of end ileostomy 2001 -Laparoscopic converted to open lysis adhesions with takedown of ileostomy.  Small bowel to transverse colon anastomosis, closure of parastomal hernia, complex abdominal repair with 30 x 20 cm Strattice biologic mesh 07/23/2015. -Incision and drainage of right-sided abdominal wall abscess with strong evidence of colocutaneous fistula 01/03/2017.  -drainage controlled with ostomy pouch -on full liquids -started on steroids by GI, seems to  be improving.  Plans to change chronic Crohn's treatment.  Continue follow up with Dr. Hassell Done  FEN - FLD VTE - Lovenox ID - Rocephin/Flagyl  I reviewed Consultant GI notes, hospitalist notes, last 24 h vitals and pain scores, last 48 h intake and output, last 24 h labs and trends, and last 24 h imaging results.   LOS: 2 days    Felicie Morn, [facility] 09/12/2022, 1:32 PM Please see Amion for pager number during day hours 7:00am-4:30pm or 7:00am -11:30am on weekends

## 2022-09-12 NOTE — Progress Notes (Signed)
Mobility Specialist - Progress Note   09/12/22 1433  Mobility  Activity Ambulated with assistance in hallway  Level of Assistance Modified independent, requires aide device or extra time  Assistive Device [facility] Ambulated (ft) 1400 ft  Range of Motion/Exercises Active  Activity Response Tolerated well  Mobility Referral Yes  $Mobility charge 1 Mobility   Pt was found in bed and agreeable to ambulate. Had no complaints and at EOS returned to bed with all necessities in reach.  Ferd Hibbs Mobility Specialist

## 2022-09-12 NOTE — Progress Notes (Signed)
PROGRESS NOTE    LUVERNE ZERKLE  IOX:[id number] DOB: 1956-10-09 DOA: 09/09/2022 PCP: [name], MD    Brief Narrative:  Patient is 66 year old gentleman with history of Crohn's disease and multiple intra-abdominal surgeries, right lower quadrant draining fistula, type 2 diabetes presented to the ER with 5 days of right lower quadrant and left lower quadrant abdominal pain and distention, he was feeling distention of his incisional hernia.  In the emergency room, hemodynamically stable but found to have active inflammation of the ileocolonic anastomosis so admitted as Crohn's exacerbation with surgery and GI following.   Assessment & Plan:   Crohn's disease with exacerbation: Tolerating soft diet, pain medications, started on high-dose IV steroids and followed by gastroenterology. Seen by surgery, currently unable to offer any surgery.  Will need ongoing follow-up.  Mobilize. Patient was on Rocephin and Flagyl, not likely intra-abdominal infection.  We will use antibiotic for UTI. Local wound care, Eakin's pouch for the right quadrant fistula. Anticipate home with prolonged steroid therapy and outpatient GI follow-up. C. difficile negative.  GI pathogen panel pending.  Hypokalemia: Replaced.  Adequate.  Type 2 diabetes: Covering with sliding scale insulin.  Anticipate worsening with use of steroids.  He is only on metformin at home.  BPH: Resume Flomax.  Acute UTI present on admission: Growing Enterobacter aerogenes.  Not sensitive to ceftriaxone.  Changed to cefepime.  Wonder if patient has colovesical fistula and growing Enterobacter.  We can treat with ciprofloxacin on discharge.   DVT prophylaxis: enoxaparin (LOVENOX) injection 40 mg Start: 09/10/22 2200   Code Status: Full code Family Communication: None at the bedside Disposition Plan: Status is: Inpatient Remains inpatient appropriate because: IV steroids, IV fluids, electrolytes     Consultants:  General  surgery Gastroenterology  Procedures:  None  Antimicrobials:  Rocephin 10/29-- 10/31 Cefepime 10/31----   Subjective: Patient was seen and examined.  No overnight events.  Able to tolerate soft diet.  Still has intermittent lower abdominal pain and has been using Dilaudid.  He was not sure gastroenterologist to let him go home today.  Objective: Vitals:   09/11/22 0629 09/11/22 1232 09/11/22 2058 09/12/22 0609  BP: (!) 124/91 (!) 142/84 (!) 146/77 (!) 154/75  Pulse: 63 100 70 (!) 54  Resp: '17 18 18 18  '$ Temp: (!) 97.5 F (36.4 C) 97.7 F (36.5 C) 97.8 F (36.6 C) 98.2 F (36.8 C)  TempSrc: Oral Oral Oral Oral  SpO2: 99% 99% 99% 98%  Weight:      Height:        Intake/Output Summary (Last 24 hours) at 09/12/2022 1149 Last data filed at 09/12/2022 1000 Gross per 24 hour  Intake 1831.65 ml  Output 1300 ml  Net 531.65 ml   Filed Weights   09/10/22 1203  Weight: 69.6 kg    Examination:  General exam: Appears calm and comfortable  Respiratory system: Clear to auscultation. Respiratory effort normal.  Cardiovascular system: S1 & S2 heard, RRR. No pedal edema. Gastrointestinal system: Soft.  Multiple abdominal scars look healthy.  He does have incisional hernia below the umbilicus that looks uncomplicated.  Mild generalized tenderness present.  Right lower quadrant fistula with minimal loose stool into a patch. Central nervous system: Alert and oriented. No focal neurological deficits. Extremities: Symmetric 5 x 5 power. Skin: No rashes, lesions or ulcers Psychiatry: Judgement and insight appear normal. Mood & affect appropriate.     Data Reviewed: I have personally reviewed following labs and imaging studies  CBC: Recent Labs  Lab 09/09/22 1954 09/10/22 1250 09/11/22 0015  WBC 17.1* 5.4 4.7  NEUTROABS 15.1* 4.9  --   HGB 13.0 10.9* 10.0*  HCT 41.8 36.0* 32.8*  MCV 85.1 87.6 86.3  PLT 303 195 355   Basic Metabolic Panel: Recent Labs  Lab 09/09/22 1954  09/10/22 1250 09/11/22 0015  NA 141 139 139  K 3.3* 4.1 4.7  CL 105 108 111  CO2 '22 24 22  '$ GLUCOSE 180* 187* 235*  BUN '19 19 18  '$ CREATININE 1.17 0.97 0.91  CALCIUM 9.6 7.9* 8.4*  MG  --  1.8  --    GFR: Estimated Creatinine Clearance: 71.1 mL/min (by C-G formula based on SCr of 0.91 mg/dL). Liver Function Tests: Recent Labs  Lab 09/09/22 1954 09/10/22 1250 09/11/22 0015  AST 19 15 13*  ALT '10 10 9  '$ ALKPHOS 89 98 66  BILITOT 0.6 0.5 0.4  PROT 7.8 6.7 6.4*  ALBUMIN 3.7 3.0* 2.7*   Recent Labs  Lab 09/09/22 1954  LIPASE 28   No results for input(s): "AMMONIA" in the last 168 hours. Coagulation Profile: No results for input(s): "INR", "PROTIME" in the last 168 hours. Cardiac Enzymes: No results for input(s): "CKTOTAL", "CKMB", "CKMBINDEX", "TROPONINI" in the last 168 hours. BNP (last 3 results) No results for input(s): "PROBNP" in the last 8760 hours. HbA1C: No results for input(s): "HGBA1C" in the last 72 hours. CBG: Recent Labs  Lab 09/11/22 0809 09/11/22 1228 09/11/22 1712 09/11/22 2102 09/12/22 0729  GLUCAP 189* 140* 163* 210* 183*   Lipid Profile: No results for input(s): "CHOL", "HDL", "LDLCALC", "TRIG", "CHOLHDL", "LDLDIRECT" in the last 72 hours. Thyroid Function Tests: No results for input(s): "TSH", "T4TOTAL", "FREET4", "T3FREE", "THYROIDAB" in the last 72 hours. Anemia Panel: No results for input(s): "VITAMINB12", "FOLATE", "FERRITIN", "TIBC", "IRON", "RETICCTPCT" in the last 72 hours. Sepsis Labs: No results for input(s): "PROCALCITON", "LATICACIDVEN" in the last 168 hours.  Recent Results (from the past 240 hour(s))  Urine Culture     Status: Abnormal   Collection Time: 09/10/22  6:41 AM   Specimen: Urine, Clean Catch  Result Value Ref Range Status   Specimen Description   Final    URINE, CLEAN CATCH Performed at [facility], [place] [address] [place], [place] [address]    Special Requests   Final    NONE Performed at  [facility], [place] [address], [place], [place] [address]    Culture >=100,000 COLONIES/mL ENTEROBACTER AEROGENES (A)  Final   Report Status 09/12/2022 FINAL  Final   Organism ID, Bacteria ENTEROBACTER AEROGENES (A)  Final      Susceptibility   Enterobacter aerogenes - MIC*    CEFAZOLIN >=64 RESISTANT Resistant     CEFEPIME <=0.12 SENSITIVE Sensitive     CEFTRIAXONE 16 RESISTANT Resistant     CIPROFLOXACIN <=0.25 SENSITIVE Sensitive     GENTAMICIN <=1 SENSITIVE Sensitive     IMIPENEM 1 SENSITIVE Sensitive     NITROFURANTOIN 128 RESISTANT Resistant     TRIMETH/SULFA <=20 SENSITIVE Sensitive     PIP/TAZO 8 SENSITIVE Sensitive     * >=100,000 COLONIES/mL ENTEROBACTER AEROGENES  C Difficile Quick Screen w PCR reflex     Status: Abnormal   Collection Time: 09/12/22 12:59 AM   Specimen: Stool  Result Value Ref Range Status   C Diff antigen NEGATIVE (A) NEGATIVE Final   C Diff toxin NEGATIVE (A) NEGATIVE Final   C Diff interpretation NEGATIVE  Final    Comment: Performed  at [facility], [place] [address] [place], [place] [address]         Radiology Studies: No results found.      Scheduled Meds:  enoxaparin (LOVENOX) injection  40 mg Subcutaneous Q24H   influenza vaccine adjuvanted  0.5 mL Intramuscular Tomorrow-1000   insulin aspart  0-15 Units Subcutaneous TID WC   insulin aspart  0-5 Units Subcutaneous QHS   methylPREDNISolone (SOLU-MEDROL) injection  20 mg Intravenous Q8H   potassium chloride  40 mEq Oral Daily   tamsulosin  0.4 mg Oral QPC supper   Continuous Infusions:  sodium chloride 75 mL/hr at 09/12/22 0056   ceFEPime (MAXIPIME) IV       LOS: 2 days    Time spent: 35 minutes    Barb Merino, MD [facility] Pager [phone]

## 2022-09-12 NOTE — Progress Notes (Signed)
[facility] Gastroenterology Progress Note  [name] 66 y.o. [birth year]    Subjective: Patient seen and examined standing.  Notes his pain is much improved today.  He has been able to walk the halls.  He was able to give stool sample last night, no blood in stool.    Objective: Vital signs in last 24 hours: Vitals:   09/11/22 2058 09/12/22 0609  BP: (!) 146/77 (!) 154/75  Pulse: 70 (!) 54  Resp: 18 18  Temp: 97.8 F (36.6 C) 98.2 F (36.8 C)  SpO2: 99% 98%    Physical Exam:  General:  Alert, cooperative, no distress, appears stated age  Head:  Normocephalic, without obvious abnormality, atraumatic  Eyes:  Anicteric sclera, EOM's intact  Lungs:   Clear to auscultation bilaterally, respirations unlabored  Heart:  Regular rate and rhythm, S1, S2 normal  Abdomen:   Soft, mid abdomen and left upper quadrant tenderness to palpation, bowel sounds active all four quadrants,  no masses,   Extremities: Extremities normal, atraumatic, no  edema  Pulses: 2+ and symmetric    Lab Results: Recent Labs    09/10/22 1250 09/11/22 0015  NA 139 139  K 4.1 4.7  CL 108 111  CO2 24 22  GLUCOSE 187* 235*  BUN 19 18  CREATININE 0.97 0.91  CALCIUM 7.9* 8.4*  MG 1.8  --    Recent Labs    09/10/22 1250 09/11/22 0015  AST 15 13*  ALT 10 9  ALKPHOS 98 66  BILITOT 0.5 0.4  PROT 6.7 6.4*  ALBUMIN 3.0* 2.7*   Recent Labs    09/09/22 1954 09/10/22 1250 09/11/22 0015  WBC 17.1* 5.4 4.7  NEUTROABS 15.1* 4.9  --   HGB 13.0 10.9* 10.0*  HCT 41.8 36.0* 32.8*  MCV 85.1 87.6 86.3  PLT 303 195 200   No results for input(s): "LABPROT", "INR" in the last 72 hours.    Assessment Ileocolonic fistulizing Crohn's disease - previously complicated by colocutaneous fistulas, CT scan with findings of inflammation to the distal small bowel proximal to the ileocolonic anastomosis.  Abdominal pain improving with Solu-Medrol 40 mg IV every 8 hours.  Tolerating soft diet well.     Plan: Start Solu-Medrol to 20 mg oral every 8 hours. Advance diet as tolerated Continue pain control as needed [facility] will signoff, patient will need to follow up outpatient with Dr. Thereasa Solo PA-C 09/12/2022, 12:45 PM  Contact #  [phone]

## 2022-09-13 DIAGNOSIS — K50013 Crohn's disease of small intestine with fistula: Secondary | ICD-10-CM | POA: Diagnosis not present

## 2022-09-13 LAB — GLUCOSE, CAPILLARY
Glucose-Capillary: 131 mg/dL — ABNORMAL HIGH (ref 70–99)
Glucose-Capillary: 244 mg/dL — ABNORMAL HIGH (ref 70–99)

## 2022-09-13 MED ORDER — OXYCODONE-ACETAMINOPHEN 5-325 MG PO TABS: 1.0000 | ORAL_TABLET | Freq: Four times a day (QID) | ORAL | 0 refills | Status: AC | PRN

## 2022-09-13 MED ORDER — PREDNISONE 20 MG PO TABS: 20.0000 mg | ORAL_TABLET | Freq: Every day | ORAL | 0 refills | Status: AC

## 2022-09-13 MED ORDER — CIPROFLOXACIN HCL 500 MG PO TABS: 500.0000 mg | ORAL_TABLET | Freq: Two times a day (BID) | ORAL | 0 refills | Status: AC

## 2022-09-13 MED ORDER — PREDNISONE 10 MG PO TABS: 30.0000 mg | ORAL_TABLET | Freq: Every day | ORAL | 0 refills | Status: AC

## 2022-09-13 MED ORDER — PREDNISONE 20 MG PO TABS: 40.0000 mg | ORAL_TABLET | Freq: Every day | ORAL | 0 refills | Status: AC

## 2022-09-13 NOTE — TOC Initial Note (Signed)
Transition of Care [facility]) - Initial/Assessment Note    Patient Details  Name: [name] MRN: [id number] Date of Birth: [birth year]  Transition of Care [facility]) CM/SW Contact:    Leeroy Cha, RN Phone Number: 09/13/2022, 9:48 AM  Clinical Narrative:                  Transition of Care [facility]) Screening Note   Patient Details  Name: [name] Date of Birth: [birth year]   Transition of Care [facility]) CM/SW Contact:    Leeroy Cha, RN Phone Number: 09/13/2022, 9:48 AM    Transition of Care Department [facility]) has reviewed patient and no TOC needs have been identified at this time. We will continue to monitor patient advancement through interdisciplinary progression rounds. If new patient transition needs arise, please place a TOC consult.    Expected Discharge Plan: Home/Self Care Barriers to Discharge: Barriers Resolved   Patient Goals and CMS Choice Patient states their goals for this hospitalization and ongoing recovery are:: to go home CMS Medicare.gov Compare Post Acute Care list provided to:: Patient    Expected Discharge Plan and Services Expected Discharge Plan: Home/Self Care   Discharge Planning Services: CM Consult   Living arrangements for the past 2 months: Single Family Home Expected Discharge Date: 09/13/22                                    Prior Living Arrangements/Services Living arrangements for the past 2 months: Single Family Home Lives with:: Spouse Patient language and need for interpreter reviewed:: Yes Do you feel safe going back to the place where you live?: Yes            Criminal Activity/Legal Involvement Pertinent to Current Situation/Hospitalization: No - Comment as needed  Activities of Daily Living Home Assistive Devices/Equipment: Cane (specify quad or straight) ADL Screening (condition at time of admission) Patient's cognitive ability adequate to safely complete daily activities?: Yes Is the patient deaf  or have difficulty hearing?: No Does the patient have difficulty seeing, even when wearing glasses/contacts?: No Does the patient have difficulty concentrating, remembering, or making decisions?: No Patient able to express need for assistance with ADLs?: Yes Does the patient have difficulty dressing or bathing?: No Independently performs ADLs?: Yes (appropriate for developmental age) Does the patient have difficulty walking or climbing stairs?: Yes (d/t recent l hip surgery) Weakness of Legs: Left Weakness of Arms/Hands: None  Permission Sought/Granted                  Emotional Assessment Appearance:: Appears stated age Attitude/Demeanor/Rapport: Engaged Affect (typically observed): Calm Orientation: : Oriented to Self, Oriented to Place, Oriented to  Time, Oriented to Situation Alcohol / Substance Use: Not Applicable Psych Involvement: No (comment)  Admission diagnosis:  Crohn's colitis ([place]) [K50.10] Acute cystitis without hematuria [N30.00] Crohn's disease of intestine without complication ([place]) [I94.85] Patient Active Problem List   Diagnosis Date Noted   BPH (benign prostatic hyperplasia) 09/10/2022   Acute cystitis 09/10/2022   Crohn's ileitis, with fistula ([place]) 09/10/2022   Crohn's disease ([place]) 09/10/2022   History of aseptic necrosis of bone 09/10/2022   Intractable hiccups 08/08/2022   Primary osteoarthritis of left hip 08/07/2022   Bilateral nephrolithiasis 06/13/2021   Diabetes mellitus type 2 in nonobese ([place]) 01/12/2017   Hypokalemia 01/04/2017   Hypomagnesemia 01/04/2017   Malnutrition of moderate degree 01/04/2017   PCP:  [name], MD Pharmacy:   [id number] [place], [place] - [address] [place] [address]Phone: [phone]: [phone]    Social Determinants of Health (SDOH) Interventions    Readmission Risk Interventions   Row Labels 08/08/2022   10:54 AM   Readmission Risk Prevention Plan   Section Header. No data exists in this row.   Post Dischage Appt   Complete  Medication Screening   Complete  Transportation Screening   Complete

## 2022-09-13 NOTE — Discharge Summary (Signed)
Physician Discharge Summary  [name] LKJ:[id number] DOB: 29-May-1956 DOA: 09/09/2022  PCP: [name], MD  Admit date: 09/09/2022 Discharge date: 09/13/2022  Admitted From: Home Disposition: Home  Recommendations for Outpatient Follow-up:  Follow up with PCP in 1-2 weeks Follow-up with gastroenterology as scheduled Surgery will schedule follow-up  Home Health: N/A N/A Equipment/Devices: N/A  Discharge Condition: Stable CODE STATUS: Full code Diet recommendation: Soft and regular diet  Discharge summary: Patient is 66 year old gentleman with history of Crohn's disease and multiple intra-abdominal surgeries, right lower quadrant draining fistula, type 2 diabetes presented to the ER with 5 days of right lower quadrant and left lower quadrant abdominal pain and distention, he was feeling distention of his incisional hernia.  In the emergency room, hemodynamically stable but found to have active inflammation of the ileocolonic anastomosis so admitted as Crohn's exacerbation with surgery and GI following.  Crohn's disease with exacerbation: Ileal cutaneous fistula. Medically stabilizing.  He is tolerating regular diet.  Normal bowel movements.  Treated with IV steroids.  Electrolytes are normal.  Followed by surgery and gastroenterology.   Gastroenterology recommended long-term steroids with initial taper that was prescribed.  He was also prescribed short-term opiates for pain management.   C. difficile was negative.  GI pathogen panel is pending, however patient only had 1 episode of loose stool in last 24 hours.   Patient is wearing [place] pouch on the right lower quadrant fistula.   GI and surgery will schedule outpatient follow-up.   Using ciprofloxacin as below. Electrolytes were replaced and adequate. Patient is on metformin at home that he will resume. Patient uses Flomax for BPH that he will continue.  Acute UTI present on admission: Growing Enterobacter aerogenes.  Not  sensitive to ceftriaxone.  Changed to cefepime.   Urinary symptoms improved.  Will treat with additional 7 days of ciprofloxacin.  Medically stabilized to discharge home.   Discharge Diagnoses:  Principal Problem:   Crohn's ileitis, with fistula ([place]) Active Problems:   Hypokalemia   Diabetes mellitus type 2 in nonobese (HCC)   Bilateral nephrolithiasis   BPH (benign prostatic hyperplasia)   Acute cystitis   Crohn's disease ([place])   History of aseptic necrosis of bone    Discharge Instructions  Discharge Instructions     Call MD for:  persistant nausea and vomiting   Complete by: As directed    Call MD for:  severe uncontrolled pain   Complete by: As directed    Diet general   Complete by: As directed    Soft and liquids   Discharge wound care:   Complete by: As directed    Collection pouch in the fistula as ordered   Increase activity slowly   Complete by: As directed       Allergies as of 09/13/2022       Reactions   Nsaids Other (See Comments)   Crohn's disease = NO NSAIDs        Medication List     TAKE these medications    aspirin EC 325 MG tablet Take 1 tablet (325 mg total) by mouth 2 (two) times daily after a meal. Take x 1 month post op to decrease risk of blood clots.   B-12 2500 MCG Tabs Take 2,500 mcg by mouth daily.   blood glucose meter kit and supplies Dispense based on patient and insurance preference. Use up to four times daily as directed. (FOR ICD-9 250.00, 250.01).   ciprofloxacin 500 MG tablet Commonly known as: Cipro  Take 1 tablet (500 mg total) by mouth 2 (two) times daily for 7 days.   Dialyvite Vitamin D 5000 125 MCG (5000 UT) capsule Generic drug: Cholecalciferol Take 5,000 Units by mouth daily.   docusate sodium 100 MG capsule Commonly known as: Colace Take 1 capsule (100 mg total) by mouth 2 (two) times daily.   mesalamine 1.2 g EC tablet Commonly known as: LIALDA Take 2.4 g by mouth 2 (two) times daily.    metFORMIN 500 MG tablet Commonly known as: GLUCOPHAGE Take 500 mg by mouth 2 (two) times daily.   oxyCODONE-acetaminophen 5-325 MG tablet Commonly known as: PERCOCET/ROXICET Take 1-2 tablets by mouth every 6 (six) hours as needed for up to 5 days for severe pain.   potassium citrate 10 MEQ (1080 MG) SR tablet Commonly known as: UROCIT-K Take 40 mEq by mouth 4 (four) times daily.   predniSONE 20 MG tablet Commonly known as: DELTASONE Take 2 tablets (40 mg total) by mouth daily for 7 days.   predniSONE 10 MG tablet Commonly known as: DELTASONE Take 3 tablets (30 mg total) by mouth daily for 7 days. Start taking on: September 20, 2022   predniSONE 20 MG tablet Commonly known as: DELTASONE Take 1 tablet (20 mg total) by mouth daily. Start taking on: September 28, 2022   tadalafil 20 MG tablet Commonly known as: CIALIS Take 20 mg by mouth daily as needed for erectile dysfunction.   tamsulosin 0.4 MG Caps capsule Commonly known as: FLOMAX Take 1 capsule (0.4 mg total) by mouth daily after supper.   tiZANidine 2 MG tablet Commonly known as: ZANAFLEX Take 1 tablet (2 mg total) by mouth every 8 (eight) hours as needed for muscle spasms.   Trospium Chloride 60 MG Cp24 Take 1 capsule by mouth daily.               Discharge Care Instructions  (From admission, onward)           Start     Ordered   09/13/22 0000  Discharge wound care:       Comments: Collection pouch in the fistula as ordered   09/13/22 0856            Allergies  Allergen Reactions   Nsaids Other (See Comments)    Crohn's disease = NO NSAIDs    Consultations: Gastroenterology Surgery   Procedures/Studies: CT Abdomen Pelvis W Contrast  Result Date: 09/10/2022 CLINICAL DATA:  Generalized abdominal pain, dominant distension, nausea, vomiting. Crohn's disease EXAM: CT ABDOMEN AND PELVIS WITH CONTRAST TECHNIQUE: Multidetector CT imaging of the abdomen and pelvis was performed using the  standard protocol following bolus administration of intravenous contrast. RADIATION DOSE REDUCTION: This exam was performed according to the departmental dose-optimization program which includes automated exposure control, adjustment of the mA and/or kV according to patient size and/or use of iterative reconstruction technique. CONTRAST:  132m OMNIPAQUE IOHEXOL 300 MG/ML  SOLN COMPARISON:  07/04/2022 FINDINGS: Lower chest: No acute abnormality. Hepatobiliary: Multiple hepatic cysts again identified. No enhancing intrahepatic mass. No intra or extrahepatic biliary ductal dilation. Gallbladder unremarkable. Pancreas: Unremarkable Spleen: Unremarkable Adrenals/Urinary Tract: The adrenal glands are unremarkable. The kidneys are normal in position. Asymmetric left renal cortical atrophy again noted. Multiple cortical hypodensities are again seen within the left kidney the largest of which are compatible with a simple cortical cyst. No follow-up imaging is recommended for these lesions. Multiple bilateral nonobstructing renal calculi are identified measuring up to 13 mm within the upper pole the  left kidney. No hydronephrosis. Serial 4 and 6 mm nonobstructing calculi are seen within the distal left ureter just proximal to the left ureterovesicular junction. No ureteral calculi on the right. The bladder is unremarkable. Stomach/Bowel: Surgical changes of ileocolectomy and distal small-bowel resection are identified. There is long segment bowel wall thickening, mucosal hyperemia, and mesenteric inflammatory stranding involving the terminal small bowel extending from the small bowel anastomosis to the ileocolic anastomosis in keeping with changes of active inflammation related to the patient's underlying inflammatory bowel disease. There is, additionally, more mild relative hyperemia involving multiple small bowel loops proximal to small bowel anastomosis in keeping with additional sites of milder inflammatory change. These  findings appear similar to prior examination though the extent of mild inflammatory change proximal small bowel anastomosis appears to involve a longer segment. There is no evidence of obstruction. No free intraperitoneal gas. No loculated intra-abdominal fluid collections or intra-abdominal fistula are identified. Shotty mesenteric adenopathy is again identified, likely reactive in nature. Stomach and proximal small bowel is unremarkable. Mild distal colonic diverticulosis without superimposed acute inflammatory change in this region. Vascular/Lymphatic: Aortic atherosclerosis. No enlarged abdominal or pelvic lymph nodes. Reproductive: Moderate prostatic enlargement. Other: Right lower quadrant broad-based fat containing incisional hernia related to remote ostomy. Mild broad-based fat containing epigastric ventral hernia. Musculoskeletal: No acute bone abnormality. 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 Left total hip arthroplasty has been performed. Remote L2 superior endplate fracture noted. IMPRESSION: 1. Surgical changes of ileocolectomy and distal small-bowel resection. 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 Milder changes seen proximal to the small bowel anastomosis. These findings appear similar to prior examination though the extent of mild inflammatory change proximal to the small bowel anastomosis appears to involve a longer segment. No evidence of obstruction, abscess, perforation or intra-abdominal fistula. 2. Mild distal colonic diverticulosis without superimposed acute inflammatory change. 3. Mild bilateral nonobstructing nephrolithiasis. Serial 4 and 6 mm nonobstructing calculi within the distal left ureter just proximal to the left ureterovesicular junction. No hydronephrosis. 4. Moderate  prostatic enlargement. 5. Aortic atherosclerosis. Aortic Atherosclerosis (ICD10-I70.0). Electronically Signed   By: Fidela Salisbury M.D.   On: 09/10/2022 05:18   (Echo, Carotid, EGD, Colonoscopy, ERCP)    Subjective: Patient was seen and examined.  Mild abdominal pain remains otherwise tolerating diet.  Denies nausea vomiting.  Eager to go home.   Discharge Exam: Vitals:   09/12/22 2125 09/13/22 0549  BP: (!) 157/73 (!) 155/82  Pulse: (!) 59 63  Resp: 17 16  Temp: 97.9 F (36.6 C) 97.7 F (36.5 C)  SpO2: 98% 98%   Vitals:   09/12/22 0900 09/12/22 1300 09/12/22 2125 09/13/22 0549  BP: (!) 173/88 (!) 165/83 (!) 157/73 (!) 155/82  Pulse: (!) 56 63 (!) 59 63  Resp: _0 Temp: 98 F (36.7 C) 98 F (36.7 C) 97.9 F (36.6 C) 97.7 F (36.5 C)  TempSrc: Oral Oral Oral Oral  SpO2: 100% 96% 98% 98%  Weight:      Height:        General: Pt is alert, awake, not in acute distress, eating breakfast. Cardiovascular: RRR, S1/S2 +, no rubs, no gallops Respiratory: CTA bilaterally, no wheezing, no rhonchi Abdominal: Multiple surgical scars nontender.  Midline incisional hernias reducible.  No rigidity or guarding.  Right lower quadrant fistula with mild stool  drainage. Extremities: no edema, no cyanosis    The results of significant diagnostics from this hospitalization (including imaging, microbiology, ancillary and laboratory) are listed below for reference.     Microbiology: Recent Results (from the past 240 hour(s))  Urine Culture     Status: Abnormal   Collection Time: 09/10/22  6:41 AM   Specimen: Urine, Clean Catch  Result Value Ref Range Status   Specimen Description   Final    URINE, CLEAN CATCH Performed at [facility] [place] [address] [place], [place] [address]    Special Requests   Final    NONE Performed at [facility], [place], [place] [address], [place], [place] [address]    Culture >=100,000 COLONIES/mL ENTEROBACTER AEROGENES  (A)  Final   Report Status 09/12/2022 FINAL  Final   Organism ID, Bacteria ENTEROBACTER AEROGENES (A)  Final      Susceptibility   Enterobacter aerogenes - MIC*    CEFAZOLIN >=64 RESISTANT Resistant     CEFEPIME <=0.12 SENSITIVE Sensitive     CEFTRIAXONE 16 RESISTANT Resistant     CIPROFLOXACIN <=0.25 SENSITIVE Sensitive     GENTAMICIN <=1 SENSITIVE Sensitive     IMIPENEM 1 SENSITIVE Sensitive     NITROFURANTOIN 128 RESISTANT Resistant     TRIMETH/SULFA <=20 SENSITIVE Sensitive     PIP/TAZO 8 SENSITIVE Sensitive     * >=100,000 COLONIES/mL ENTEROBACTER AEROGENES  C Difficile Quick Screen w PCR reflex     Status: Abnormal   Collection Time: 09/12/22 12:59 AM   Specimen: Stool  Result Value Ref Range Status   C Diff antigen NEGATIVE (A) NEGATIVE Final   C Diff toxin NEGATIVE (A) NEGATIVE Final   C Diff interpretation NEGATIVE  Final    Comment: Performed at [facility], [place] [address], [place], [place] [address]     Labs: BNP (last 3 results) No results for input(s): "BNP" in the last 8760 hours. Basic Metabolic Panel: Recent Labs  Lab 09/09/22 1954 09/10/22 1250 09/11/22 0015  NA 141 139 139  K 3.3* 4.1 4.7  CL 105 108 111  CO2 _0 GLUCOSE 180* 187* 235*  BUN _1 CREATININE 1.17 0.97 0.91  CALCIUM 9.6 7.9* 8.4*  MG  --  1.8  --    Liver Function Tests: Recent Labs  Lab 09/09/22 1954 09/10/22 1250 09/11/22 0015  AST 19 15 13*  ALT _2 ALKPHOS 89 98 66  BILITOT 0.6 0.5 0.4  PROT 7.8 6.7 6.4*  ALBUMIN 3.7 3.0* 2.7*   Recent Labs  Lab 09/09/22 1954  LIPASE 28   No results for input(s): "AMMONIA" in the last 168 hours. CBC: Recent Labs  Lab 09/09/22 1954 09/10/22 1250 09/11/22 0015  WBC 17.1* 5.4 4.7  NEUTROABS 15.1* 4.9  --   HGB 13.0 10.9* 10.0*  HCT 41.8 36.0* 32.8*  MCV 85.1 87.6 86.3  PLT 303 195 200   Cardiac Enzymes: No results for input(s): "CKTOTAL", "CKMB", "CKMBINDEX", "TROPONINI" in the last 168  hours. BNP: Invalid input(s): "POCBNP" CBG: Recent Labs  Lab 09/12/22 0729 09/12/22 1159 09/12/22 1636 09/12/22 2120 09/13/22 0726  GLUCAP 183* 219* 209* 224* 131*   D-Dimer No results for input(s): "DDIMER" in the last 72 hours. Hgb A1c No results for input(s): "HGBA1C" in the last 72 hours. Lipid Profile No results for input(s): "CHOL", "HDL", "LDLCALC", "TRIG", "CHOLHDL", "LDLDIRECT" in the last 72 hours. Thyroid function studies No results for  input(s): "TSH", "T4TOTAL", "T3FREE", "THYROIDAB" in the last 72 hours.  Invalid input(s): "FREET3" Anemia work up No results for input(s): "VITAMINB12", "FOLATE", "FERRITIN", "TIBC", "IRON", "RETICCTPCT" in the last 72 hours. Urinalysis    Component Value Date/Time   COLORURINE YELLOW 09/09/2022 0420   APPEARANCEUR CLEAR 09/09/2022 0420   LABSPEC >1.030 (H) 09/09/2022 0420   PHURINE 5.5 09/09/2022 0420   GLUCOSEU NEGATIVE 09/09/2022 0420   HGBUR SMALL (A) 09/09/2022 0420   BILIRUBINUR SMALL (A) 09/09/2022 0420   KETONESUR 15 (A) 09/09/2022 0420   PROTEINUR 30 (A) 09/09/2022 0420   UROBILINOGEN 0.2 02/16/2009 1931   NITRITE POSITIVE (A) 09/09/2022 0420   LEUKOCYTESUR SMALL (A) 09/09/2022 0420   Sepsis Labs Recent Labs  Lab 09/09/22 1954 09/10/22 1250 09/11/22 0015  WBC 17.1* 5.4 4.7   Microbiology Recent Results (from the past 240 hour(s))  Urine Culture     Status: Abnormal   Collection Time: 09/10/22  6:41 AM   Specimen: Urine, Clean Catch  Result Value Ref Range Status   Specimen Description   Final    URINE, CLEAN CATCH Performed at [facility], [place] [address] [place], [place] [address]    Special Requests   Final    NONE Performed at [facility], [place] [address], [place], [place] [address]    Culture >=100,000 COLONIES/mL ENTEROBACTER AEROGENES (A)  Final   Report Status 09/12/2022 FINAL  Final   Organism ID, Bacteria ENTEROBACTER AEROGENES (A)  Final       Susceptibility   Enterobacter aerogenes - MIC*    CEFAZOLIN >=64 RESISTANT Resistant     CEFEPIME <=0.12 SENSITIVE Sensitive     CEFTRIAXONE 16 RESISTANT Resistant     CIPROFLOXACIN <=0.25 SENSITIVE Sensitive     GENTAMICIN <=1 SENSITIVE Sensitive     IMIPENEM 1 SENSITIVE Sensitive     NITROFURANTOIN 128 RESISTANT Resistant     TRIMETH/SULFA <=20 SENSITIVE Sensitive     PIP/TAZO 8 SENSITIVE Sensitive     * >=100,000 COLONIES/mL ENTEROBACTER AEROGENES  C Difficile Quick Screen w PCR reflex     Status: Abnormal   Collection Time: 09/12/22 12:59 AM   Specimen: Stool  Result Value Ref Range Status   C Diff antigen NEGATIVE (A) NEGATIVE Final   C Diff toxin NEGATIVE (A) NEGATIVE Final   C Diff interpretation NEGATIVE  Final    Comment: Performed at [facility], [place] [address] [place], [place] [address]     Time coordinating discharge: 35 minutes  SIGNED:   Barb Merino, MD  [facility] 09/13/2022, 8:56 AM

## 2022-09-13 NOTE — TOC Transition Note (Signed)
Transition of Care [facility]) - CM/SW Discharge Note   Patient Details  Name: [name] MRN: [id number] Date of Birth: 16-Apr-1956  [facility]) CM/SW Contact:  Leeroy Cha, RN Phone Number: 09/13/2022, 9:48 AM   Clinical Narrative:     Discharged to home with self care.  Final next level of care: Home/Self Care Barriers to Discharge: Barriers Resolved   Patient Goals and CMS Choice Patient states their goals for this hospitalization and ongoing recovery are:: to go home CMS Medicare.gov Compare Post Acute Care list provided to:: Patient    Discharge Placement                       Discharge Plan and Services   Discharge Planning Services: CM Consult                                 Social Determinants of Health (SDOH) Interventions     Readmission Risk Interventions   Row Labels 08/08/2022   10:54 AM  Readmission Risk Prevention Plan   Section Header. No data exists in this row.   Post Dischage Appt   Complete  Medication Screening   Complete  Transportation Screening   Complete

## 2022-09-13 NOTE — Progress Notes (Signed)
Mobility Specialist - Progress Note   09/13/22 1029  Mobility  Activity Ambulated with assistance in hallway  Level of [place]  Distance Ambulated (ft) 1000 ft  Activity Response Tolerated well  Mobility Referral Yes  $Mobility charge 1 Mobility   Pt received in bed and agreeable to mobility. No complaints during ambulation. Pt to bathroom after session with all needs met.    [facility]

## 2022-09-15 LAB — GASTROINTESTINAL PANEL BY PCR, STOOL (REPLACES STOOL CULTURE)

## 2022-11-28 ENCOUNTER — Other Ambulatory Visit (HOSPITAL_COMMUNITY): Payer: Self-pay | Admitting: Orthopedic Surgery

## 2022-11-28 ENCOUNTER — Ambulatory Visit (INDEPENDENT_AMBULATORY_CARE_PROVIDER_SITE_OTHER)
Admission: RE | Admit: 2022-11-28 | Discharge: 2022-11-28 | Disposition: A | Discharge status: Home / Self Care | Payer: 59 | Source: Ambulatory Visit | Attending: Vascular Surgery | Admitting: Vascular Surgery

## 2022-11-28 DIAGNOSIS — G40901 Epilepsy, unspecified, not intractable, with status epilepticus: Secondary | ICD-10-CM | POA: Diagnosis not present

## 2022-11-28 DIAGNOSIS — R609 Edema, unspecified: Secondary | ICD-10-CM

## 2022-11-28 DIAGNOSIS — R569 Unspecified convulsions: Secondary | ICD-10-CM | POA: Diagnosis not present

## 2022-11-29 ENCOUNTER — Emergency Department (HOSPITAL_COMMUNITY): Payer: 59

## 2022-11-29 ENCOUNTER — Inpatient Hospital Stay (HOSPITAL_COMMUNITY)
Admission: EM | Admit: 2022-11-29 | Discharge: 2022-12-02 | DRG: 101 | Disposition: A | Discharge status: Home / Self Care | Payer: 59 | Attending: Internal Medicine | Admitting: Internal Medicine

## 2022-11-29 ENCOUNTER — Inpatient Hospital Stay (HOSPITAL_COMMUNITY): Payer: 59

## 2022-11-29 ENCOUNTER — Other Ambulatory Visit: Payer: Self-pay

## 2022-11-29 DIAGNOSIS — Z1152 Encounter for screening for COVID-19: Secondary | ICD-10-CM

## 2022-11-29 DIAGNOSIS — E872 Acidosis, unspecified: Secondary | ICD-10-CM | POA: Diagnosis present

## 2022-11-29 DIAGNOSIS — R569 Unspecified convulsions: Secondary | ICD-10-CM | POA: Diagnosis not present

## 2022-11-29 DIAGNOSIS — E778 Other disorders of glycoprotein metabolism: Secondary | ICD-10-CM | POA: Diagnosis present

## 2022-11-29 DIAGNOSIS — Z809 Family history of malignant neoplasm, unspecified: Secondary | ICD-10-CM

## 2022-11-29 DIAGNOSIS — Z96642 Presence of left artificial hip joint: Secondary | ICD-10-CM | POA: Diagnosis present

## 2022-11-29 DIAGNOSIS — Z8572 Personal history of non-Hodgkin lymphomas: Secondary | ICD-10-CM

## 2022-11-29 DIAGNOSIS — Z7982 Long term (current) use of aspirin: Secondary | ICD-10-CM

## 2022-11-29 DIAGNOSIS — K50012 Crohn's disease of small intestine with intestinal obstruction: Secondary | ICD-10-CM | POA: Diagnosis present

## 2022-11-29 DIAGNOSIS — Z820 Family history of epilepsy and other diseases of the nervous system: Secondary | ICD-10-CM | POA: Diagnosis not present

## 2022-11-29 DIAGNOSIS — E119 Type 2 diabetes mellitus without complications: Secondary | ICD-10-CM | POA: Diagnosis present

## 2022-11-29 DIAGNOSIS — J690 Pneumonitis due to inhalation of food and vomit: Secondary | ICD-10-CM | POA: Diagnosis present

## 2022-11-29 DIAGNOSIS — N179 Acute kidney failure, unspecified: Secondary | ICD-10-CM | POA: Diagnosis present

## 2022-11-29 DIAGNOSIS — Z7984 Long term (current) use of oral hypoglycemic drugs: Secondary | ICD-10-CM | POA: Diagnosis not present

## 2022-11-29 DIAGNOSIS — E8809 Other disorders of plasma-protein metabolism, not elsewhere classified: Secondary | ICD-10-CM | POA: Diagnosis present

## 2022-11-29 DIAGNOSIS — Z87891 Personal history of nicotine dependence: Secondary | ICD-10-CM | POA: Diagnosis not present

## 2022-11-29 DIAGNOSIS — D509 Iron deficiency anemia, unspecified: Secondary | ICD-10-CM | POA: Diagnosis present

## 2022-11-29 DIAGNOSIS — E876 Hypokalemia: Secondary | ICD-10-CM | POA: Diagnosis present

## 2022-11-29 DIAGNOSIS — G40901 Epilepsy, unspecified, not intractable, with status epilepticus: Secondary | ICD-10-CM | POA: Diagnosis not present

## 2022-11-29 DIAGNOSIS — Z79899 Other long term (current) drug therapy: Secondary | ICD-10-CM | POA: Diagnosis not present

## 2022-11-29 DIAGNOSIS — Z8673 Personal history of transient ischemic attack (TIA), and cerebral infarction without residual deficits: Secondary | ICD-10-CM | POA: Diagnosis not present

## 2022-11-29 DIAGNOSIS — K50013 Crohn's disease of small intestine with fistula: Secondary | ICD-10-CM | POA: Diagnosis present

## 2022-11-29 LAB — CBC WITH DIFFERENTIAL/PLATELET
Abs Immature Granulocytes: 0.04 10*3/uL (ref 0.00–0.07)
Basophils Absolute: 0 10*3/uL (ref 0.0–0.1)
Basophils Relative: 0 %
Eosinophils Absolute: 0 10*3/uL (ref 0.0–0.5)
Eosinophils Relative: 0 %
HCT: 39.2 % (ref 39.0–52.0)
Hemoglobin: 12 g/dL — ABNORMAL LOW (ref 13.0–17.0)
Immature Granulocytes: 0 %
Lymphocytes Relative: 5 %
Lymphs Abs: 0.5 10*3/uL — ABNORMAL LOW (ref 0.7–4.0)
MCH: 26.1 pg (ref 26.0–34.0)
MCHC: 30.6 g/dL (ref 30.0–36.0)
MCV: 85.4 fL (ref 80.0–100.0)
Monocytes Absolute: 0.6 10*3/uL (ref 0.1–1.0)
Monocytes Relative: 7 %
Neutro Abs: 8.2 10*3/uL — ABNORMAL HIGH (ref 1.7–7.7)
Neutrophils Relative %: 88 %
Platelets: 200 10*3/uL (ref 150–400)
RBC: 4.59 MIL/uL (ref 4.22–5.81)
RDW: 15.8 % — ABNORMAL HIGH (ref 11.5–15.5)
WBC: 9.3 10*3/uL (ref 4.0–10.5)
nRBC: 0 % (ref 0.0–0.2)

## 2022-11-29 LAB — COMPREHENSIVE METABOLIC PANEL
ALT: 11 U/L (ref 0–44)
AST: 26 U/L (ref 15–41)
Albumin: 3.2 g/dL — ABNORMAL LOW (ref 3.5–5.0)
Alkaline Phosphatase: 52 U/L (ref 38–126)
Anion gap: 14 (ref 5–15)
BUN: 10 mg/dL (ref 8–23)
CO2: 21 mmol/L — ABNORMAL LOW (ref 22–32)
Calcium: 8.6 mg/dL — ABNORMAL LOW (ref 8.9–10.3)
Chloride: 107 mmol/L (ref 98–111)
Creatinine, Ser: 1.5 mg/dL — ABNORMAL HIGH (ref 0.61–1.24)
GFR, Estimated: 51 mL/min — ABNORMAL LOW (ref >60)
Glucose, Bld: 220 mg/dL — ABNORMAL HIGH (ref 70–99)
Potassium: 3.6 mmol/L (ref 3.5–5.1)
Sodium: 142 mmol/L (ref 135–145)
Total Bilirubin: 0.4 mg/dL (ref 0.3–1.2)
Total Protein: 6.2 g/dL — ABNORMAL LOW (ref 6.5–8.1)

## 2022-11-29 LAB — LACTIC ACID, PLASMA: Lactic Acid, Venous: 7.3 mmol/L (ref 0.5–1.9)

## 2022-11-29 LAB — RESP PANEL BY RT-PCR (RSV, FLU A&B, COVID)  RVPGX2
Influenza A by PCR: NEGATIVE
Influenza B by PCR: NEGATIVE
Resp Syncytial Virus by PCR: NEGATIVE
SARS Coronavirus 2 by RT PCR: NEGATIVE

## 2022-11-29 LAB — CBG MONITORING, ED
Glucose-Capillary: 197 mg/dL — ABNORMAL HIGH (ref 70–99)
Glucose-Capillary: 126 mg/dL — ABNORMAL HIGH (ref 70–99)

## 2022-11-29 LAB — PROTIME-INR
INR: 1.1 (ref 0.8–1.2)
Prothrombin Time: 13.7 seconds (ref 11.4–15.2)

## 2022-11-29 LAB — ETHANOL: Alcohol, Ethyl (B): <10 mg/dL (ref <10)

## 2022-11-29 MED ORDER — ONDANSETRON HCL 4 MG PO TABS: 4.0000 mg | ORAL_TABLET | Freq: Four times a day (QID) | ORAL | Status: DC | PRN

## 2022-11-29 MED ORDER — LORAZEPAM 2 MG/ML IJ SOLN: 2.0000 mg | INTRAMUSCULAR | Status: DC | PRN

## 2022-11-29 MED ORDER — SODIUM CHLORIDE 0.9 % IV SOLN: 1.5000 g | Freq: Four times a day (QID) | INTRAVENOUS | Status: DC

## 2022-11-29 MED ORDER — ACETAMINOPHEN 325 MG PO TABS: 650.0000 mg | ORAL_TABLET | ORAL | Status: DC | PRN

## 2022-11-29 MED ORDER — SODIUM CHLORIDE 0.9 % IV SOLN: INTRAVENOUS | Status: DC

## 2022-11-29 MED ORDER — INSULIN ASPART 100 UNIT/ML IJ SOLN
0.0000 [IU] | INTRAMUSCULAR | Status: DC
  Administered 2022-11-29 – 2022-12-01 (×2): 1 [IU] via SUBCUTANEOUS

## 2022-11-29 MED ORDER — LEVETIRACETAM IN NACL 1000 MG/100ML IV SOLN
INTRAVENOUS | Status: AC
  Administered 2022-11-29: 1000 mg
  Filled 2022-11-29: qty 200

## 2022-11-29 MED ORDER — LORAZEPAM 2 MG/ML IJ SOLN
INTRAMUSCULAR | Status: AC
  Administered 2022-11-29: 2 mg
  Filled 2022-11-29: qty 1

## 2022-11-29 MED ORDER — ACETAMINOPHEN 650 MG RE SUPP: 650.0000 mg | RECTAL | Status: DC | PRN

## 2022-11-29 MED ORDER — GADOBUTROL 1 MMOL/ML IV SOLN
7.0000 mL | Freq: Once | INTRAVENOUS | Status: AC | PRN
  Administered 2022-11-29: 7 mL via INTRAVENOUS

## 2022-11-29 MED ORDER — ONDANSETRON HCL 4 MG/2ML IJ SOLN: 4.0000 mg | Freq: Four times a day (QID) | INTRAMUSCULAR | Status: DC | PRN

## 2022-11-29 MED ORDER — LEVETIRACETAM IN NACL 500 MG/100ML IV SOLN
500.0000 mg | Freq: Two times a day (BID) | INTRAVENOUS | Status: DC
  Administered 2022-11-29: 500 mg via INTRAVENOUS

## 2022-11-29 MED ORDER — HEPARIN SODIUM (PORCINE) 5000 UNIT/ML IJ SOLN
5000.0000 [IU] | Freq: Three times a day (TID) | INTRAMUSCULAR | Status: DC
  Administered 2022-11-29 – 2022-11-30 (×3): 5000 [IU] via SUBCUTANEOUS
  Filled 2022-11-29 (×3): qty 1

## 2022-11-29 MED ORDER — SODIUM CHLORIDE 0.9 % IV SOLN
2500.0000 mg | Freq: Once | INTRAVENOUS | Status: AC
  Administered 2022-11-29: 2500 mg via INTRAVENOUS
  Filled 2022-11-29: qty 25

## 2022-11-29 MED ORDER — SODIUM CHLORIDE 0.9 % IV SOLN
1.5000 g | Freq: Four times a day (QID) | INTRAVENOUS | Status: DC
  Administered 2022-11-29 – 2022-11-30 (×2): 1.5 g via INTRAVENOUS
  Filled 2022-11-29 (×5): qty 4

## 2022-11-29 MED ORDER — LEVETIRACETAM IN NACL 1000 MG/100ML IV SOLN
1000.0000 mg | Freq: Two times a day (BID) | INTRAVENOUS | Status: DC
  Administered 2022-11-30: 1000 mg via INTRAVENOUS
  Filled 2022-11-29: qty 100

## 2022-11-29 MED ORDER — IOHEXOL 350 MG/ML SOLN
75.0000 mL | Freq: Once | INTRAVENOUS | Status: AC | PRN
  Administered 2022-11-29: 75 mL via INTRAVENOUS

## 2022-11-29 NOTE — Assessment & Plan Note (Signed)
Presenting with new onset seizure activity.  Loaded with IV Keppra and given IV Ativan 2 mg with resolution of seizure activity.  Now postictal/obtunded. -Neurology following -Continue IV Keppra 1000 mg twice daily -cEEG ordered -MRI brain with/without pending -Seizure precautions

## 2022-11-29 NOTE — Assessment & Plan Note (Signed)
Severe lactic acidosis in setting of seizure activity. -Continue IV fluids and repeat lactic level

## 2022-11-29 NOTE — Assessment & Plan Note (Addendum)
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  Seems to have some abdominal tenderness on exam. -Obtain KUB -Keep n.p.o. ADDENDUM: KUB concerning for ileus versus obstruction.  Follow-up CT A/P shows new partial SBO changes proximal to similar-appearing segment of inflamed small bowel prior to the small bowel to colonic anastomosis. -Discussed with general surgery, Dr. Zenia Resides, who will see in consultation -Place NG tube

## 2022-11-29 NOTE — ED Notes (Signed)
Admitting physician at bedside evaluating patient.

## 2022-11-29 NOTE — Assessment & Plan Note (Signed)
In setting of seizure activity.  Creatinine 1.50 on admission. -IV normal saline overnight -Monitor UOP and repeat labs in a.m. -Hold metformin and avoid NSAIDs

## 2022-11-29 NOTE — ED Notes (Signed)
Patient taken to MRI

## 2022-11-29 NOTE — ED Notes (Signed)
Patient arrived back from MRI

## 2022-11-29 NOTE — Hospital Course (Signed)
[name] is a 67 y.o. male with medical history significant for Crohn's disease with ileocolonic fistula, multiple intra-abdominal surgeries, T2DM who is admitted with new onset seizure activity.

## 2022-11-29 NOTE — Consult Note (Signed)
NEUROLOGY CONSULTATION NOTE   Date of service: November 29, 2022 Patient Name: [name] MRN:  [id number] DOB:  Jan 10, 1956 Reason for consult: seizure Requesting physician: Blanchie Dessert MD _ _ _   _ __   _ __ _ _  __ __   _ __   __ _  History of Present Illness   This is a 67 year old gentleman with a past medical history significant for Crohn's disease with multiple complications multiple abdominal surgeries, diabetes who presented today with new onset seizure to the ED.  Patient's wife provides the history and states that they were talking when he suddenly became unresponsive.  She lowered him to the ground and he developed generalized convulsions.  He was bleeding from his mouth and had bit his tongue.  He was still convulsing when EMS arrived.  They administered 5 mg of IM Versed but noted that his eyes were still deviated to the left.  His eye deviation persisted on arrival to the emergency department and he was then witnessed by the EDP to have his subsequent convulsive seizure involving flexion of his left arm followed by generalized tonic-clonic movements and unresponsiveness.  He received 1500 mg of Keppra.  He is obtunded on my examination without evidence of current seizure activity.  Per wife patient does not have significant alcohol call use history, is not prescribed benzodiazepines, and does not use recreational drugs.  He has no recent history of trauma.  He has never had a seizure before.  Head CT showed chronic right thalamic lacunar infarct with no other findings and no acute intracranial process on personal review.  Lactate 7.3.  WBC 9.3. No s/s infection.   ROS   UTA 2/2 mental status  Past History   I have reviewed the following:  Past Medical History:  Diagnosis Date   Abdominal wall abscess 01/03/2017   Arthritis    Bladder stone    Cancer ([place])    mycosis fungoides   Crohn's disease ([place])    Crohn's disease with abscess ([place]) 07/23/2015   Diabetes mellitus  without complication (HCC)    History of aseptic necrosis of bone BILATERAL HIPS   S/P BONE GRAFT   History of kidney stones    History of mycosis fungoides    S/P ileostomy ([place])    Past Surgical History:  Procedure Laterality Date   BONE GRAFT OF LEFT HIP  11/13/1984   ASEPTIC NECROSIS   COLONOSCOPY     CYSTO/ BILATERAL RETROGRADE PYELOGRAM/ LEFT URETERAL STONE EXTRACTION / BILATERAL STENT PLACEMENT  10/07/2003   CYSTOSCOPY WITH LITHOLAPAXY  02/12/2012   Procedure: CYSTOSCOPY WITH LITHOLAPAXY;  Surgeon: Claybon Jabs, MD;  Location: [facility];  Service: Urology;  Laterality: N/A;   EXPL. LAP. W/ ENTEROLYSIS, RESECTION INFLAMMATORY MASS RLQ / TAKE-DOWN OF FISTULA WITH ENTEROENTEROSTOMY/ RESECTION WITH THE SMALL BOWEL TO ASCENDING COLON ANASTOMOSIS  10/29/2000   CROHN'S DISEASE W/ ANASTOMOTIC INFLAMMATORY MASS/    10-30-2000 EXPL. LAP. CONTROL POST-OP ABD. BLEEDING   EXPLORATORY LAPAROTOMY/ RESECTION OF ILEOCOLONIC ANASTOMOSIS AND CREATION OF ILEOSTOMY  11/04/2000   ABD. PERFORATION   HARDWARE REMOVAL Left 08/07/2022   Procedure: HARDWARE REMOVAL;  Surgeon: Dorna Leitz, MD;  [facility];  Service: Orthopedics;  Laterality: Left;   [place] N/A 07/23/2015   Procedure: Takedown ileostomy and repair of ostomy hernia, extensive entrolysis (2.5 hrs), ileostransverse colon anastomosis; closure of massive ventral hernia with 20 x  30 Strattice mesh;  Surgeon: Johnathan Hausen, MD;  [facility];  Service: General;  Laterality: N/A;   INCISIONAL HERNIA REPAIR  07/23/2015   Strattice mesh   IRRIGATION AND DEBRIDEMENT ABSCESS Right 09/09/2014   Procedure: MINOR INCISION AND DRAINAGE OF ABSCESS;  Surgeon: Charlotte Crumb, MD;  Location: [facility];  Service: Orthopedics;  Laterality: Right;  incision and drainage right long finger    IRRIGATION AND DEBRIDEMENT ABSCESS N/A 01/03/2017   Procedure:  IRRIGATION AND DEBRIDEMENT ABDOMINAL WALL ABSCESS;  Surgeon: Johnathan Hausen, MD;  [facility];  Service: General;  Laterality: N/A;   LAPAROSCOPY N/A 07/23/2015   Procedure: LAPAROSCOPY DIAGNOSTIC;  Surgeon: Johnathan Hausen, MD;  [facility];  Service: General;  Laterality: N/A;   ORIF ANKLE FRACTURE Left 08/27/2014   Procedure:  open reduction internal fixation left ankle;  Surgeon: Nita Sells, MD;  Location: [facility];  Service: Orthopedics;  Laterality: Left;  Left open reduction internal fixation ankle   RIGHT URETEROSCOPIC STONE EXTRACITON  04-24-2005  & 12-01-2002   TOTAL HIP ARTHROPLASTY Left 08/07/2022   Procedure: TOTAL HIP ARTHROPLASTY ANTERIOR APPROACH;  Surgeon: Dorna Leitz, MD;  [facility];  Service: Orthopedics;  Laterality: Left;   Family History  Problem Relation Age of Onset   Alzheimer's disease Father    Cancer Father    Cancer Sister    Social History   Socioeconomic History   Marital status: Married    Spouse name: Not on file   Number of children: 3   Years of education: Not on file   Highest education level: Not on file  Occupational History   Not on file  Tobacco Use   Smoking status: Former    Types: Cigarettes    Quit date: 08/26/1994    Years since quitting: 28.2   Smokeless tobacco: Never  Vaping Use   Vaping Use: Never used  Substance and Sexual Activity   Alcohol use: No   Drug use: No   Sexual activity: Never  Other Topics Concern   Not on file  Social History Narrative   Not on file   Social Determinants of Health   Financial Resource Strain: Not on file  Food Insecurity: No Food Insecurity (09/10/2022)   Hunger Vital Sign    Worried About Running Out of Food in the Last Year: Never true    Ran Out of Food in the Last Year: Never true  Transportation Needs: No Transportation Needs (09/10/2022)   PRAPARE - Hydrologist (Medical): No    Lack of Transportation (Non-Medical): No   Physical Activity: Not on file  Stress: Not on file  Social Connections: Not on file   Allergies  Allergen Reactions   Nsaids Other (See Comments)    Crohn's disease = NO NSAIDs    Medications   (Not in a hospital admission)     Current Facility-Administered Medications:    levETIRAcetam (KEPPRA) 2,500 mg in sodium chloride 0.9 % 250 mL IVPB, 2,500 mg, Intravenous, Once, Derek Jack, MD   Derrill Memo ON 11/30/2022] levETIRAcetam (KEPPRA) IVPB 1000 mg/100 mL premix, 1,000 mg, Intravenous, Q12H, Derek Jack, MD  Current Outpatient Medications:    aspirin EC 325 MG tablet, Take 1 tablet (325 mg total) by mouth 2 (two) times daily after a meal. Take x 1 month post op to decrease risk of blood clots., Disp: 60 tablet, Rfl: 0   blood glucose meter kit and supplies, Dispense based  on patient and insurance preference. Use up to four times daily as directed. (FOR ICD-9 250.00, 250.01)., Disp: 1 each, Rfl: 0   Cholecalciferol (DIALYVITE VITAMIN D 5000) 125 MCG (5000 UT) capsule, Take 5,000 Units by mouth daily., Disp: , Rfl:    Cyanocobalamin (B-12) 2500 MCG TABS, Take 2,500 mcg by mouth daily., Disp: , Rfl:    docusate sodium (COLACE) 100 MG capsule, Take 1 capsule (100 mg total) by mouth 2 (two) times daily., Disp: 30 capsule, Rfl: 0   mesalamine (LIALDA) 1.2 g EC tablet, Take 2.4 g by mouth 2 (two) times daily., Disp: , Rfl:    metFORMIN (GLUCOPHAGE) 500 MG tablet, Take 500 mg by mouth 2 (two) times daily., Disp: , Rfl:    potassium citrate (UROCIT-K) 10 MEQ (1080 MG) SR tablet, Take 40 mEq by mouth 4 (four) times daily., Disp: , Rfl:    tadalafil (CIALIS) 20 MG tablet, Take 20 mg by mouth daily as needed for erectile dysfunction., Disp: , Rfl:    tamsulosin (FLOMAX) 0.4 MG CAPS capsule, Take 1 capsule (0.4 mg total) by mouth daily after supper., Disp: 30 capsule, Rfl: 0   tiZANidine (ZANAFLEX) 2 MG tablet, Take 1 tablet (2 mg total) by mouth every 8 (eight) hours as needed for muscle  spasms., Disp: 40 tablet, Rfl: 0   Trospium Chloride 60 MG CP24, Take 1 capsule by mouth daily., Disp: , Rfl:   Vitals   Vitals:   11/29/22 1730 11/29/22 1745 11/29/22 1815 11/29/22 1845  BP: 129/82 135/77 (!) 150/77 (!) 152/91  Pulse: (!) 108 (!) 109 (!) 102 100  Resp: (!) 29 (!) 23 (!) 25 (!) 21  SpO2: 96% 96% 100% 100%     There is no height or weight on file to calculate BMI.  Physical Exam   Physical Exam Gen: obtunded, grimaces in response to sternal rub, does not follow simple commands HEENT: Normocephalic;mucous membranes moist; oropharynx clear, dried blood on tongue and lips Resp: CTAB, no w/r/r CV: RRR, no m/g/r; nml S1 and S2. 2+ symmetric peripheral pulses.  Neuro: *MS: obtunded, grimaces in response to sternal rub, does not follow simple commands *Speech: no intelligible speech *CN: L pupil 3 mm R 2 mm reactive to light bilat, (+) corneals, gaze midline with (+) oculocephalics, face symmetric at rest *Motor & sensory: grimaces and withdraws symmetrically to noxious stimuli in all extremities equally *Reflexes: 2+ symm throughout, toes down bilat *Coordination, gait: UTA  NIHSS  1a Level of Conscious.: 2 1b LOC Questions: 2 1c LOC Commands: 2 2 Best Gaze: 0 3 Visual: 0 4 Facial Palsy: 0 5a Motor Arm - left: 2 5b Motor Arm - Right: 2 6a Motor Leg - Left: 2 6b Motor Leg - Right: 2 7 Limb Ataxia: 0 8 Sensory: 0 9 Best Language: 3 10 Dysarthria: 2 11 Extinct. and Inatten.: 0  TOTAL: 19   Premorbid mRS = 2   Labs   CBC:  Recent Labs  Lab 11/29/22 1720  WBC 9.3  NEUTROABS 8.2*  HGB 12.0*  HCT 39.2  MCV 85.4  PLT 983    Basic Metabolic Panel:  Lab Results  Component Value Date   NA 142 11/29/2022   K 3.6 11/29/2022   CO2 21 (L) 11/29/2022   GLUCOSE 220 (H) 11/29/2022   BUN 10 11/29/2022   CREATININE 1.50 (H) 11/29/2022   CALCIUM 8.6 (L) 11/29/2022   GFRNONAA 51 (L) 11/29/2022   GFRAA >60 01/12/2017   Lipid Panel:  Lab  Results   Component Value Date   [facility]  02/17/2009    63        Total Cholesterol/HDL:CHD Risk Coronary Heart Disease Risk Table                     Men   Women  1/2 Average Risk   3.4   3.3  Average Risk       5.0   4.4  2 X Average Risk   9.6   7.1  3 X Average Risk  23.4   11.0        Use the calculated Patient Ratio above and the CHD Risk Table to determine the patient's CHD Risk.        ATP III CLASSIFICATION (LDL):  <100     mg/dL   Optimal  100-129  mg/dL   Near or Above                    Optimal  130-159  mg/dL   Borderline  160-189  mg/dL   High  >190     mg/dL   Very High   HgbA1c:  Lab Results  Component Value Date   HGBA1C 6.3 (H) 07/31/2022   Urine Drug Screen: No results found for: "LABOPIA", "COCAINSCRNUR", "LABBENZ", "AMPHETMU", "THCU", "LABBARB"  Alcohol Level     Component Value Date/Time   ETH <10 11/29/2022 1720     Impression   This is a 67 year old gentleman with a past medical history significant for Crohn's disease with multiple complications multiple abdominal surgeries, diabetes who presents with 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  He has no known history of seizures.  He has obtunded on examination but withdraws all extremities equally. No identifiable provoking factor. No s/s infection, not on immunosuppression or biologics. Recommend MRI brain wwo contrast r/o stroke or other structural etiology as well as cEEG to r/o nonconvulsive seizures.  Recommendations   - S/p 60 mg/kg load keppra, continue '1000mg'$  bid - MRI brain wwo - cEEG - '2mg'$  IV ativan prn for seizures - Seizure precautions - Will continue to follow ______________________________________________________________________   Thank you for the opportunity to take part in the care of this patient. If you have any further questions, please contact the neurology consultation attending.  Signed,  Su Monks, MD [facility] [phone]  If  7pm- 7am, please page neurology on call as listed in [place].  **Any copied and pasted documentation in this note was written by me in another application not billed for and pasted by me into this document.

## 2022-11-29 NOTE — H&P (Addendum)
History and Physical    [name]:[id number] DOB: Jan 10, 1956 DOA: 11/29/2022  PCP: [name], MD  Patient coming from: Home  I have personally briefly reviewed patient's old medical records in [facility]  Chief Complaint: Seizure activity  HPI: [name] is a 67 y.o. male with medical history significant for Crohn's disease with ileocolonic fistula, multiple intra-abdominal surgeries, T2DM who presented to the ED for evaluation of seizure activity.  Patient unable to provide history as he is obtunded and is otherwise supplemented by EDP and chart review.  Patient was at home speaking with his spouse when he suddenly became unresponsive.  She lowered him to the ground and he developed generalized convulsions.  He was bleeding from his mouth after he bit his tongue.  EMS were called and on their arrival he was still convulsing.  He was given 5 mg of IM Versed and was noted to have eyes still deviated to the left which persisted on arrival to the ED.  He was witnessed by the EDP to have subsequent convulsive seizure involving flexion of his left arm followed by generalized tonic-clonic movements and unresponsiveness.  He has no known prior seizure history.  Per prior discussion with his spouse he does not have significant alcohol use, does not use benzodiazepines or recreational drugs.  ED Course  Labs/Imaging on admission: I have personally reviewed following labs and imaging studies.  Initial vitals showed BP 119/77, pulse 116, RR 42, temperature not recorded, SpO2 98% on room air.  Labs show WBC 9.3, hemoglobin 12.0, platelets 200,000, sodium 142, potassium 3.6, bicarb 21, BUN 10, creatinine 1.50, serum glucose 220, LFTs within normal limits, lactic acid 7.3.  Serum ethanol <10.  COVID, influenza, RSV PCR negative.  Blood cultures in process.  CT head without contrast shows a chronic right thalamic lacunar infarct without acute intracranial process.  Portable  chest x-ray shows left greater than right airspace opacity in the lower lobes, low lung volumes.  Patient was given IV Keppra 2500 mg and IV Ativan 2 mg in the ED.  Neurology consulted and patient placed on IV Keppra 1000 mg q12h.  MRI brain and continuous EEG were ordered.  The hospitalist service was consulted to admit for further evaluation and management.  Review of Systems:  Unable to obtain full review of systems due to obtunded state.   Past Medical History:  Diagnosis Date   Abdominal wall abscess 01/03/2017   Arthritis    Bladder stone    Cancer ([place])    mycosis fungoides   Crohn's disease ([place])    Crohn's disease with abscess ([place]) 07/23/2015   Diabetes mellitus without complication (HCC)    History of aseptic necrosis of bone BILATERAL HIPS   S/P BONE GRAFT   History of kidney stones    History of mycosis fungoides    S/P ileostomy ([place])     Past Surgical History:  Procedure Laterality Date   BONE GRAFT OF LEFT HIP  11/13/1984   ASEPTIC NECROSIS   COLONOSCOPY     CYSTO/ BILATERAL RETROGRADE PYELOGRAM/ LEFT URETERAL STONE EXTRACTION / BILATERAL STENT PLACEMENT  10/07/2003   CYSTOSCOPY WITH LITHOLAPAXY  02/12/2012   Procedure: CYSTOSCOPY WITH LITHOLAPAXY;  Surgeon: Claybon Jabs, MD;  Location: [facility];  Service: Urology;  Laterality: N/A;   EXPL. LAP. W/ ENTEROLYSIS, RESECTION INFLAMMATORY MASS RLQ / TAKE-DOWN OF FISTULA WITH ENTEROENTEROSTOMY/ RESECTION WITH THE SMALL BOWEL TO ASCENDING COLON ANASTOMOSIS  10/29/2000   CROHN'S DISEASE  W/ ANASTOMOTIC INFLAMMATORY MASS/    10-30-2000 EXPL. LAP. CONTROL POST-OP ABD. BLEEDING   EXPLORATORY LAPAROTOMY/ RESECTION OF ILEOCOLONIC ANASTOMOSIS AND CREATION OF ILEOSTOMY  11/04/2000   ABD. PERFORATION   HARDWARE REMOVAL Left 08/07/2022   Procedure: HARDWARE REMOVAL;  Surgeon: Dorna Leitz, MD;  [facility];  Service: Orthopedics;  Laterality: Left;   [place] N/A 07/23/2015   Procedure: Takedown ileostomy and repair of ostomy hernia, extensive entrolysis (2.5 hrs), ileostransverse colon anastomosis; closure of massive ventral hernia with 20 x 30 Strattice mesh;  Surgeon: Johnathan Hausen, MD;  [facility];  Service: General;  Laterality: N/A;   INCISIONAL HERNIA REPAIR  07/23/2015   Strattice mesh   IRRIGATION AND DEBRIDEMENT ABSCESS Right 09/09/2014   Procedure: MINOR INCISION AND DRAINAGE OF ABSCESS;  Surgeon: Charlotte Crumb, MD;  Location: [facility];  Service: Orthopedics;  Laterality: Right;  incision and drainage right long finger    IRRIGATION AND DEBRIDEMENT ABSCESS N/A 01/03/2017   Procedure: IRRIGATION AND DEBRIDEMENT ABDOMINAL WALL ABSCESS;  Surgeon: Johnathan Hausen, MD;  [facility];  Service: General;  Laterality: N/A;   LAPAROSCOPY N/A 07/23/2015   Procedure: LAPAROSCOPY DIAGNOSTIC;  Surgeon: Johnathan Hausen, MD;  [facility];  Service: General;  Laterality: N/A;   ORIF ANKLE FRACTURE Left 08/27/2014   Procedure:  open reduction internal fixation left ankle;  Surgeon: Nita Sells, MD;  Location: [facility];  Service: Orthopedics;  Laterality: Left;  Left open reduction internal fixation ankle   RIGHT URETEROSCOPIC STONE EXTRACITON  04-24-2005  & 12-01-2002   TOTAL HIP ARTHROPLASTY Left 08/07/2022   Procedure: TOTAL HIP ARTHROPLASTY ANTERIOR APPROACH;  Surgeon: Dorna Leitz, MD;  [facility];  Service: Orthopedics;  Laterality: Left;    Social History:  reports that he quit smoking about 28 years ago. His smoking use included cigarettes. He has never used smokeless tobacco. He reports that he does not drink alcohol and does not use drugs.  Allergies  Allergen Reactions   Nsaids Other (See Comments)    Crohn's disease = NO NSAIDs    Family History  Problem Relation Age of Onset   Alzheimer's disease Father    Cancer Father    Cancer Sister      Prior to Admission  medications   Medication Sig Start Date End Date Taking? Authorizing Provider  aspirin EC 325 MG tablet Take 1 tablet (325 mg total) by mouth 2 (two) times daily after a meal. Take x 1 month post op to decrease risk of blood clots. 08/07/22   Gary Fleet, PA-C  blood glucose meter kit and supplies Dispense based on patient and insurance preference. Use up to four times daily as directed. (FOR ICD-9 250.00, 250.01). 01/12/17   Earnstine Regal, PA-C  Cholecalciferol (DIALYVITE VITAMIN D 5000) 125 MCG (5000 UT) capsule Take 5,000 Units by mouth daily.    [provider]  Cyanocobalamin (B-12) 2500 MCG TABS Take 2,500 mcg by mouth daily.    [provider]  docusate sodium (COLACE) 100 MG capsule Take 1 capsule (100 mg total) by mouth 2 (two) times daily. 08/07/22   Gary Fleet, PA-C  mesalamine (LIALDA) 1.2 g EC tablet Take 2.4 g by mouth 2 (two) times daily. 07/07/22   [provider]  metFORMIN (GLUCOPHAGE) 500 MG tablet Take 500 mg by mouth 2 (two) times daily. 05/23/21   [provider]  potassium citrate (UROCIT-K) 10  MEQ (1080 MG) SR tablet Take 40 mEq by mouth 4 (four) times daily.    [provider]  tadalafil (CIALIS) 20 MG tablet Take 20 mg by mouth daily as needed for erectile dysfunction. 05/04/21   [provider]  tamsulosin (FLOMAX) 0.4 MG CAPS capsule Take 1 capsule (0.4 mg total) by mouth daily after supper. 01/12/17   Earnstine Regal, PA-C  tiZANidine (ZANAFLEX) 2 MG tablet Take 1 tablet (2 mg total) by mouth every 8 (eight) hours as needed for muscle spasms. 08/07/22   Gary Fleet, PA-C  Trospium Chloride 60 MG CP24 Take 1 capsule by mouth daily. 06/04/22   [provider]    Physical Exam: Vitals:   11/29/22 2130 11/29/22 2200 11/29/22 2230 11/29/22 2330  BP: (!) 151/105 127/81 138/84 127/84  Pulse: (!) 114 (!) 107 (!) 108 84  Resp: 20 (!) 21 (!) 25 18  Temp:      TempSrc:      SpO2: 99% 100% 99% 98%   Exam  limited due to patient being obtunded Constitutional: Resting in bed, somnolent, grimaces to noxious stimuli Eyes: Right pupil sluggishly reactive to light, left pupil dilated and constricts to light ENMT: Mucous membranes are dry.  Bite marks on tongue Neck: normal, supple, no masses. Respiratory: Coarse breath sounds anteriorly. Normal respiratory effort. No accessory muscle use.  Cardiovascular: Regular rate and rhythm, no murmurs / rubs / gallops.  Trace bilateral lower extremity edema. 2+ pedal pulses. Abdomen: Multiple surgical scars, patient grimaces on abdominal palpation Musculoskeletal: no clubbing / cyanosis. No joint deformity upper and lower extremities.  Moves all extremities spontaneously Skin: no rashes, lesions, ulcers. No induration Neurologic: Obtunded, withdraws all extremities to noxious stimuli Psychiatric: Obtunded  EKG: Personally reviewed. Sinus tachycardia, rate 116, no acute ischemic changes.  Rate is faster when compared to prior.  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 male with medical history significant for Crohn's disease with ileocolonic fistula, multiple intra-abdominal surgeries, T2DM who is admitted with new onset seizure activity.  Assessment and Plan: * New onset seizure without head trauma ([place]) Presenting with new onset seizure activity.  Loaded with IV Keppra and given IV Ativan 2 mg with resolution of seizure activity.  Now postictal/obtunded. -Neurology following -Continue IV Keppra 1000 mg twice daily -cEEG ordered -MRI brain with/without pending -Seizure precautions  Aspiration pneumonitis ([place]) Chest x-ray concerning for aspiration pneumonitis versus pneumonia in setting of emesis during  seizure activity. -Start IV Unasyn  AKI (acute kidney injury) ([place]) In setting of seizure activity.  Creatinine 1.50 on admission. -IV normal saline overnight -Monitor UOP and repeat labs in a.m. -Hold metformin and avoid NSAIDs  Lactic acidosis Severe lactic acidosis in setting of seizure activity. 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  Seems to have some abdominal tenderness on exam. -Obtain KUB -Keep n.p.o. ADDENDUM: KUB concerning for ileus versus obstruction.  Follow-up CT A/P shows new partial SBO changes proximal to similar-appearing segment of inflamed small bowel prior to the small bowel to colonic anastomosis. -Discussed with general surgery, Dr. Zenia Resides, who will see in consultation -Place NG tube  Type  2 diabetes mellitus ([place]) Placed on sliding scale insulin q4h while NPO.  DVT prophylaxis: heparin injection 5,000 Units Start: 11/29/22 2200 Code Status: Full code Family Communication: None present on admission, could not reach primary contact by phone Disposition Plan: From home, dispo pending clinical progress Consults called: Neurology Severity of Illness: The appropriate patient status for this patient is INPATIENT. Inpatient status is judged to be reasonable and necessary in order to provide the required intensity of service to ensure the patient's safety. The patient's presenting symptoms, physical exam findings, and initial radiographic and laboratory data in the context of their chronic comorbidities is felt to place them at high risk for further clinical deterioration. Furthermore, it is not anticipated that the patient will be medically stable for discharge from the hospital within 2 midnights of admission.   * I certify that at the point of admission it is my clinical judgment that the patient will require  inpatient hospital care spanning beyond 2 midnights from the point of admission due to high intensity of service, high risk for further deterioration and high frequency of surveillance required.Zada Finders MD [facility]  If 7PM-7AM, please contact night-coverage [email or web address]  11/30/2022, 12:08 AM

## 2022-11-29 NOTE — ED Triage Notes (Signed)
Pt BIB[facility]EMS from home where he had a witnessed seizure. Wife helped patient to ground, bit his tongue. Pt seizing with EMS approx 2 minutes ( violent ) , 5 versed IM ( 15 min ago )  No hx seizure  Hx abdominal surgery   20 cc suctioned with EMS  CBG 174 HR 120s BP 103/67  Deviated eye gaze to the left

## 2022-11-29 NOTE — Assessment & Plan Note (Signed)
Placed on sliding scale insulin q4h while NPO.

## 2022-11-29 NOTE — ED Provider Notes (Addendum)
[facility] EMERGENCY DEPARTMENT Provider Note   CSN: [id number] Arrival date & time: 11/29/22  1636     History  Chief Complaint  Patient presents with   Seizures    DEMARQUIS OSLEY is a 67 y.o. male.  Patient is a 67y/o male with hx of Crohn's disease and multiple complications as a result of Crohn's disease with multiple abdominal surgeries, DM who is presenting today due to new onset seizure.  History comes from the patient's wife and she reported that they were just talking when he suddenly became unresponsive she lowered him to the ground and he started shaking diffusely.  When EMS arrived patient was convulsing and had bit his tongue.  He received 5 of IM Versed 15 minutes prior to arrival but noted that his eyes were still deviated to the left.  Patient does not have a significant alcohol use history or drug use history.  No recent traumas.  The history is provided by the EMS personnel and medical records. The history is limited by the condition of the patient.  Seizures      Home Medications Prior to Admission medications   Medication Sig Start Date End Date Taking? Authorizing Provider  aspirin EC 325 MG tablet Take 1 tablet (325 mg total) by mouth 2 (two) times daily after a meal. Take x 1 month post op to decrease risk of blood clots. 08/07/22   Gary Fleet, PA-C  blood glucose meter kit and supplies Dispense based on patient and insurance preference. Use up to four times daily as directed. (FOR ICD-9 250.00, 250.01). 01/12/17   Earnstine Regal, PA-C  Cholecalciferol (DIALYVITE VITAMIN D 5000) 125 MCG (5000 UT) capsule Take 5,000 Units by mouth daily.    [provider]  Cyanocobalamin (B-12) 2500 MCG TABS Take 2,500 mcg by mouth daily.    [provider]  docusate sodium (COLACE) 100 MG capsule Take 1 capsule (100 mg total) by mouth 2 (two) times daily. 08/07/22   Gary Fleet, PA-C  mesalamine (LIALDA) 1.2 g EC tablet Take 2.4 g  by mouth 2 (two) times daily. 07/07/22   [provider]  metFORMIN (GLUCOPHAGE) 500 MG tablet Take 500 mg by mouth 2 (two) times daily. 05/23/21   [provider]  potassium citrate (UROCIT-K) 10 MEQ (1080 MG) SR tablet Take 40 mEq by mouth 4 (four) times daily.    [provider]  tadalafil (CIALIS) 20 MG tablet Take 20 mg by mouth daily as needed for erectile dysfunction. 05/04/21   [provider]  tamsulosin (FLOMAX) 0.4 MG CAPS capsule Take 1 capsule (0.4 mg total) by mouth daily after supper. 01/12/17   Earnstine Regal, PA-C  tiZANidine (ZANAFLEX) 2 MG tablet Take 1 tablet (2 mg total) by mouth every 8 (eight) hours as needed for muscle spasms. 08/07/22   Gary Fleet, PA-C  Trospium Chloride 60 MG CP24 Take 1 capsule by mouth daily. 06/04/22   [provider]      Allergies    Nsaids    Review of Systems   Review of Systems  Neurological:  Positive for seizures.    Physical Exam Updated Vital Signs BP (!) 150/77   Pulse (!) 102   Resp (!) 25   SpO2 100%  Physical Exam Vitals and nursing note reviewed.  Constitutional:      General: He is not in acute distress.    Appearance: He is well-developed.     Comments: Unresponsive  HENT:  Head: Normocephalic and atraumatic.     Comments: Bleeding coming from the mouth but teeth are clamped Eyes:     Conjunctiva/sclera: Conjunctivae normal.     Pupils: Pupils are equal, round, and reactive to light.     Comments: Eyes are both deviated to the left  Cardiovascular:     Rate and Rhythm: Regular rhythm. Tachycardia present.     Heart sounds: No murmur heard. Pulmonary:     Effort: Pulmonary effort is normal. No respiratory distress.     Breath sounds: Normal breath sounds. No wheezing or rales.     Comments: Tachypneic Abdominal:     General: There is no distension.     Palpations: Abdomen is soft.     Tenderness: There is no abdominal tenderness. There is no guarding or rebound.      Comments: Numerous scars over the abdomen small healing ostomy scar in the right mid abdomen  Musculoskeletal:        General: No tenderness. Normal range of motion.     Cervical back: Normal range of motion and neck supple.     Right lower leg: No edema.     Left lower leg: No edema.  Skin:    General: Skin is warm and dry.     Findings: No erythema or rash.  Neurological:     Comments: Initially patient's eyes were deviated and then shortly after I arrived in the room he started having generalized tonic-clonic movements of the upper and lower extremities with sonorous respirations lasting approximately 1 minute  Psychiatric:     Comments: unresponsive     ED Results / Procedures / Treatments   Labs (all labs ordered are listed, but only abnormal results are displayed) Labs Reviewed  COMPREHENSIVE METABOLIC PANEL - Abnormal; Notable for the following components:      Result Value   CO2 21 (*)    Glucose, Bld 220 (*)    Creatinine, Ser 1.50 (*)    Calcium 8.6 (*)    Total Protein 6.2 (*)    Albumin 3.2 (*)    GFR, Estimated 51 (*)    All other components within normal limits  LACTIC ACID, PLASMA - Abnormal; Notable for the following components:   Lactic Acid, Venous 7.3 (*)    All other components within normal limits  CBC WITH DIFFERENTIAL/PLATELET - Abnormal; Notable for the following components:   Hemoglobin 12.0 (*)    RDW 15.8 (*)    Neutro Abs 8.2 (*)    Lymphs Abs 0.5 (*)    All other components within normal limits  CBG MONITORING, ED - Abnormal; Notable for the following components:   Glucose-Capillary 197 (*)    All other components within normal limits  CULTURE, BLOOD (ROUTINE X 2)  CULTURE, BLOOD (ROUTINE X 2)  RESP PANEL BY RT-PCR (RSV, FLU A&B, COVID)  RVPGX2  PROTIME-INR  ETHANOL  LACTIC ACID, PLASMA  URINALYSIS, ROUTINE W REFLEX MICROSCOPIC  RAPID URINE DRUG SCREEN, HOSP PERFORMED    EKG EKG Interpretation  Date/Time:  Wednesday November 29 2022 16:42:13 EST Ventricular Rate:  116 PR Interval:  148 QRS Duration: 78 QT Interval:  357 QTC Calculation: 496 R Axis:   -15 Text Interpretation: Sinus tachycardia Probable left atrial enlargement Borderline left axis deviation new  Borderline prolonged QT interval Confirmed by Blanchie Dessert ([id number]) on 11/29/2022 4:57:40 PM  Radiology DG Chest Port 1 View  Result Date: 11/29/2022 CLINICAL DATA:  Sepsis, seizure EXAM: PORTABLE CHEST 1 VIEW  COMPARISON:  07/31/2022 FINDINGS: Heavily reverse lordotic projection somewhat obscures the lung bases. Low lung volumes are present. Suspicion for left greater than right airspace opacity in the lower lobes. Heart size within normal limits for projection. Expected glenohumeral alignment noted bilaterally on this single view. IMPRESSION: 1. Suspicion for left greater than right airspace opacity in the lower lobes, potentially from pneumonia, atelectasis, or aspiration pneumonitis. Assessment of the lung bases is adversely affected by the reverse lordotic projection which can exaggerate basilar findings. Repeat radiography may be warranted when feasible. 2. Low lung volumes. Electronically Signed   By: Van Clines M.D.   On: 11/29/2022 17:35   CT Head Wo Contrast  Result Date: 11/29/2022 CLINICAL DATA:  Seizure EXAM: CT HEAD WITHOUT CONTRAST TECHNIQUE: Contiguous axial images were obtained from the base of the skull through the vertex without intravenous contrast. RADIATION DOSE REDUCTION: This exam was performed according to the departmental dose-optimization program which includes automated exposure control, adjustment of the mA and/or kV according to patient size and/or use of iterative reconstruction technique. COMPARISON:  None Available. FINDINGS: Brain: Chronic appearing lacunar infarct within the right thalamus/posterior limb of the internal capsule. No evidence of acute infarct or hemorrhage. Lateral ventricles and remaining midline structures  are unremarkable. No acute extra-axial fluid collections. No mass effect. Vascular: No hyperdense vessel or unexpected calcification. Skull: Normal. Negative for fracture or focal lesion. Sinuses/Orbits: No acute finding. Other: None. IMPRESSION: 1. Chronic right thalamic lacunar infarct. No acute intracranial process. Electronically Signed   By: Randa Ngo M.D.   On: 11/29/2022 17:14   VAS [place] LOWER EXTREMITY VENOUS (DVT)  Result Date: 11/28/2022  Lower Venous DVT Study Patient Name:  SHAROD PETSCH  Date of Exam:   11/28/2022 Medical Rec #: [id number]         Accession #:    [id number] Date of Birth: 09-24-1956         Patient Gender: M Patient Age:   62 years Exam Location:  [facility] Procedure:      VAS [place] LOWER EXTREMITY VENOUS (DVT) Referring Phys: Jenny Reichmann GRAVES --------------------------------------------------------------------------------  Indications: Pain, and Swelling.  Risk Factors: Surgery Left hip replacement 08/07/2022. Comparison Study: No prior exam. Performing Technologist: Alvia Grove RVT  Examination Guidelines: A complete evaluation includes B-mode imaging, spectral Doppler, color Doppler, and power Doppler as needed of all accessible portions of each vessel. Bilateral testing is considered an integral part of a complete examination. Limited examinations for reoccurring indications may be performed as noted. The reflux portion of the exam is performed with the patient in reverse Trendelenburg.  +-----+---------------+---------+-----------+----------+--------------+ RIGHTCompressibilityPhasicitySpontaneityPropertiesThrombus Aging +-----+---------------+---------+-----------+----------+--------------+ CFV  Full           No       No                                  +-----+---------------+---------+-----------+----------+--------------+   +---------+---------------+---------+-----------+----------+--------------+ LEFT      CompressibilityPhasicitySpontaneityPropertiesThrombus Aging +---------+---------------+---------+-----------+----------+--------------+ CFV      Full           Yes      Yes                                 +---------+---------------+---------+-----------+----------+--------------+ SFJ      Full           Yes      Yes                                 +---------+---------------+---------+-----------+----------+--------------+  FV Prox  Full           Yes      Yes                                 +---------+---------------+---------+-----------+----------+--------------+ FV Mid   Full           Yes      Yes                                 +---------+---------------+---------+-----------+----------+--------------+ FV DistalFull           Yes      Yes                                 +---------+---------------+---------+-----------+----------+--------------+ PFV      Full           Yes      Yes                                 +---------+---------------+---------+-----------+----------+--------------+ POP      Full           Yes      Yes                                 +---------+---------------+---------+-----------+----------+--------------+ PTV      Full           Yes      Yes                                 +---------+---------------+---------+-----------+----------+--------------+ PERO     Full           Yes      Yes                  NWV            +---------+---------------+---------+-----------+----------+--------------+ Gastroc  Full           Yes      Yes                                 +---------+---------------+---------+-----------+----------+--------------+ GSV      Full           Yes      Yes                                 +---------+---------------+---------+-----------+----------+--------------+ SSV      Full           Yes      Yes                                  +---------+---------------+---------+-----------+----------+--------------+    Findings reported to [facility] at 12:35.  Summary: LEFT: - There is no evidence of deep vein thrombosis in the lower extremity. - There is no evidence of superficial venous thrombosis.  - No cystic structure found in the popliteal fossa.  *See table(s)  above for measurements and observations. Electronically signed by Monica Martinez MD on 11/28/2022 at 4:03:51 PM.    Final     Procedures Procedures    Medications Ordered in ED Medications  levETIRAcetam (KEPPRA) IVPB 500 mg/100 mL premix (0 mg Intravenous Stopped 11/29/22 1715)  LORazepam (ATIVAN) 2 MG/ML injection (2 mg  Given 11/29/22 1730)  levETIRAcetam (KEPPRA) 1000 MG/100ML IVPB (0 mg  Stopped 11/29/22 1715)    ED Course/ Medical Decision Making/ A&P                             Medical Decision Making Amount and/or Complexity of Data Reviewed Independent Historian: spouse and EMS External Data Reviewed: notes. Labs: ordered. Decision-making details documented in ED Course. Radiology: ordered and independent interpretation performed. Decision-making details documented in ED Course. ECG/medicine tests: ordered and independent interpretation performed. Decision-making details documented in ED Course.  Risk Prescription drug management. Decision regarding hospitalization.   Pt with multiple medical problems and comorbidities and presenting today with a complaint that caries a high risk for morbidity and mortality.  Here today with new onset seizure of unknown etiology.  Patient has never had seizures before it is not a regular alcohol or drug user.  Patient has no obvious signs of infection at this time.  Sats maintaining at 94% on room air, tachycardia initially quite high during seizure activity but then improved to low 100s.  Blood pressure has been stable.  Patient received 5 mg of Versed IM prior to arrival and then with ongoing seizure activity here  received 1500 mg of Keppra and 2 mg of Ativan and appears to have stopped seizing at this time.  Workup for new seizure initiated.  6:33 PM Pt is currently still post ictal but will respond to pain.  I independently interpreted patient's labs and EKG.  CMP with new AKI with creatinine 1.5 from 0.9 but normal electrolytes and LFTs, lactate elevated at 7.3 today which feels most likely from witnessed seizures, CBC with stable hemoglobin and normal white count, EtOH is negative.  EKG without acute findings.  I have independently visualized and interpreted pt's images today. CT of the head without evidence of acute bleed radiology reports chronic right thalamic lacunar infarct but no acute intracranial process, chest x-ray is a poor inspiratory film possible atelectasis or minimal infiltrate in the lower lobes which is most likely related to aspiration given the seizures as EMS did report suctioning a large amount of emesis.  Dr. Quinn Axe with neurology evaluated the patient.  Patient will need EEG and MRI.  At this time unknown cause for seizure.  Will admit for further care.  Plan was discussed with the patient's wife.  CRITICAL CARE Performed by: Khaleem Burchill Total critical care time: 40 minutes Critical care time was exclusive of separately billable procedures and treating other patients. Critical care was necessary to treat or prevent imminent or life-threatening deterioration. Critical care was time spent personally by me on the following activities: development of treatment plan with patient and/or surrogate as well as nursing, discussions with consultants, evaluation of patient's response to treatment, examination of patient, obtaining history from patient or surrogate, ordering and performing treatments and interventions, ordering and review of laboratory studies, ordering and review of radiographic studies, pulse oximetry and re-evaluation of patient's condition.        Final Clinical  Impression(s) / ED Diagnoses Final diagnoses:  New onset seizure ([place])    Rx /  DC Orders ED Discharge Orders     None         Blanchie Dessert, MD 11/29/22 [id number]    Blanchie Dessert, MD 11/29/22 2220

## 2022-11-29 NOTE — Assessment & Plan Note (Signed)
Chest x-ray concerning for aspiration pneumonitis versus pneumonia in setting of emesis during seizure activity. -Start IV Unasyn

## 2022-11-30 ENCOUNTER — Encounter (HOSPITAL_COMMUNITY): Payer: Self-pay | Admitting: Internal Medicine

## 2022-11-30 ENCOUNTER — Inpatient Hospital Stay (HOSPITAL_COMMUNITY): Payer: 59

## 2022-11-30 DIAGNOSIS — R569 Unspecified convulsions: Secondary | ICD-10-CM | POA: Diagnosis not present

## 2022-11-30 LAB — LACTIC ACID, PLASMA: Lactic Acid, Venous: 1 mmol/L (ref 0.5–1.9)

## 2022-11-30 LAB — CBC
HCT: 35 % — ABNORMAL LOW (ref 39.0–52.0)
Hemoglobin: 11.2 g/dL — ABNORMAL LOW (ref 13.0–17.0)
MCH: 27 pg (ref 26.0–34.0)
MCHC: 32 g/dL (ref 30.0–36.0)
MCV: 84.3 fL (ref 80.0–100.0)
Platelets: 171 10*3/uL (ref 150–400)
RBC: 4.15 MIL/uL — ABNORMAL LOW (ref 4.22–5.81)
RDW: 15.9 % — ABNORMAL HIGH (ref 11.5–15.5)
WBC: 6.3 10*3/uL (ref 4.0–10.5)
nRBC: 0 % (ref 0.0–0.2)

## 2022-11-30 LAB — COMPREHENSIVE METABOLIC PANEL
ALT: 11 U/L (ref 0–44)
AST: 17 U/L (ref 15–41)
Albumin: 2.7 g/dL — ABNORMAL LOW (ref 3.5–5.0)
Alkaline Phosphatase: 44 U/L (ref 38–126)
Anion gap: 9 (ref 5–15)
BUN: 10 mg/dL (ref 8–23)
CO2: 22 mmol/L (ref 22–32)
Calcium: 7.9 mg/dL — ABNORMAL LOW (ref 8.9–10.3)
Chloride: 110 mmol/L (ref 98–111)
Creatinine, Ser: 0.99 mg/dL (ref 0.61–1.24)
GFR, Estimated: >60 mL/min (ref >60)
Glucose, Bld: 97 mg/dL (ref 70–99)
Potassium: 2.9 mmol/L — ABNORMAL LOW (ref 3.5–5.1)
Sodium: 141 mmol/L (ref 135–145)
Total Bilirubin: 0.4 mg/dL (ref 0.3–1.2)
Total Protein: 5.4 g/dL — ABNORMAL LOW (ref 6.5–8.1)

## 2022-11-30 LAB — MAGNESIUM: Magnesium: 1.6 mg/dL — ABNORMAL LOW (ref 1.7–2.4)

## 2022-11-30 LAB — URINALYSIS, ROUTINE W REFLEX MICROSCOPIC
Bilirubin Urine: NEGATIVE
Glucose, UA: NEGATIVE mg/dL
Hgb urine dipstick: NEGATIVE
Ketones, ur: 5 mg/dL — AB
Leukocytes,Ua: NEGATIVE
Nitrite: NEGATIVE
Protein, ur: NEGATIVE mg/dL
Specific Gravity, Urine: 1.041 — ABNORMAL HIGH (ref 1.005–1.030)
pH: 6 (ref 5.0–8.0)

## 2022-11-30 LAB — RAPID URINE DRUG SCREEN, HOSP PERFORMED
Amphetamines: NOT DETECTED
Barbiturates: NOT DETECTED
Benzodiazepines: POSITIVE — AB
Cocaine: NOT DETECTED
Opiates: NOT DETECTED
Tetrahydrocannabinol: NOT DETECTED

## 2022-11-30 LAB — CBG MONITORING, ED
Glucose-Capillary: 91 mg/dL (ref 70–99)
Glucose-Capillary: 88 mg/dL (ref 70–99)
Glucose-Capillary: 96 mg/dL (ref 70–99)
Glucose-Capillary: 95 mg/dL (ref 70–99)

## 2022-11-30 LAB — GLUCOSE, CAPILLARY
Glucose-Capillary: 117 mg/dL — ABNORMAL HIGH (ref 70–99)
Glucose-Capillary: 128 mg/dL — ABNORMAL HIGH (ref 70–99)

## 2022-11-30 MED ORDER — KCL IN DEXTROSE-NACL 20-5-0.9 MEQ/L-%-% IV SOLN
INTRAVENOUS | Status: DC
  Filled 2022-11-30 (×2): qty 1000

## 2022-11-30 MED ORDER — FESOTERODINE FUMARATE ER 4 MG PO TB24
4.0000 mg | ORAL_TABLET | Freq: Every day | ORAL | Status: DC
  Administered 2022-12-01 – 2022-12-02 (×2): 4 mg via ORAL
  Filled 2022-11-30 (×2): qty 1

## 2022-11-30 MED ORDER — MAGNESIUM SULFATE 4 GM/100ML IV SOLN
4.0000 g | Freq: Once | INTRAVENOUS | Status: AC
  Administered 2022-11-30: 4 g via INTRAVENOUS
  Filled 2022-11-30: qty 100

## 2022-11-30 MED ORDER — ENOXAPARIN SODIUM 40 MG/0.4ML IJ SOSY
40.0000 mg | PREFILLED_SYRINGE | INTRAMUSCULAR | Status: DC
  Administered 2022-11-30 – 2022-12-01 (×2): 40 mg via SUBCUTANEOUS
  Filled 2022-11-30 (×2): qty 0.4

## 2022-11-30 MED ORDER — LEVETIRACETAM 500 MG PO TABS
750.0000 mg | ORAL_TABLET | Freq: Two times a day (BID) | ORAL | Status: DC
  Administered 2022-11-30 – 2022-12-02 (×4): 750 mg via ORAL
  Filled 2022-11-30 (×4): qty 1

## 2022-11-30 MED ORDER — SODIUM CHLORIDE 0.9 % IV SOLN: 750.0000 mg | Freq: Two times a day (BID) | INTRAVENOUS | Status: DC

## 2022-11-30 MED ORDER — POTASSIUM CHLORIDE 10 MEQ/100ML IV SOLN
10.0000 meq | INTRAVENOUS | Status: AC
  Administered 2022-11-30 (×4): 10 meq via INTRAVENOUS
  Filled 2022-11-30 (×4): qty 100

## 2022-11-30 MED ORDER — MESALAMINE 1.2 G PO TBEC
2.4000 g | DELAYED_RELEASE_TABLET | Freq: Two times a day (BID) | ORAL | Status: DC
  Administered 2022-11-30 – 2022-12-02 (×5): 2.4 g via ORAL
  Filled 2022-11-30 (×8): qty 2

## 2022-11-30 MED ORDER — TROSPIUM CHLORIDE ER 60 MG PO CP24: 60.0000 mg | ORAL_CAPSULE | Freq: Every morning | ORAL | Status: DC

## 2022-11-30 MED ORDER — MIDAZOLAM HCL 2 MG/2ML IJ SOLN: 2.0000 mg | INTRAMUSCULAR | Status: DC | PRN

## 2022-11-30 MED ORDER — TAMSULOSIN HCL 0.4 MG PO CAPS
0.4000 mg | ORAL_CAPSULE | Freq: Every morning | ORAL | Status: DC
  Administered 2022-12-01 – 2022-12-02 (×2): 0.4 mg via ORAL
  Filled 2022-11-30 (×2): qty 1

## 2022-11-30 NOTE — Procedures (Addendum)
Patient Name: [name]  MRN: [id number]  Epilepsy Attending: Lora Havens  Referring Physician/Provider: Derek Jack, MD  Duration: 11/30/2022 0211 to 12/01/2022 0211  Patient history: 67 year old male with new onset status epilepticus.  EEG to evaluate for seizure.  Level of alertness: Awake, asleep  AEDs during EEG study: LEV  Technical aspects: This EEG study was done with scalp electrodes positioned according to the 10-20 International system of electrode placement. Electrical activity was reviewed with band pass filter of 1-'70Hz'$ , sensitivity of 7 uV/mm, display speed of 68m/sec with a '60Hz'$  notched filter applied as appropriate. EEG data were recorded continuously and digitally stored.  Video monitoring was available and reviewed as appropriate.  Description: The posterior dominant rhythm consists of 8 Hz activity of moderate voltage (25-35 uV) seen predominantly in posterior head regions, symmetric and reactive to eye opening and eye closing. Sleep was characterized by vertex waves, sleep spindles (12 to 14 Hz), maximal frontocentral region. Hyperventilation and photic stimulation were not performed.     IMPRESSION: This study is within normal limits. No seizures or epileptiform discharges were seen throughout the recording.  A normal interictal EEG does not exclude the diagnosis of epilepsy.  [name]

## 2022-11-30 NOTE — Consult Note (Signed)
[facility] Nurse fistula consult note Fistula type/location: ileocolonic fistula RLQ  Perifistula assessment: skin scarred, intact, atypical presentation to peristomal skin RLQ.  Bulging noted, no tenderness with palpation.  Slightly warmer in comparison to LLQ.  Treatment options for perifistula skin: skin prep, 2" barrier ring  Output none noted on assessment today.  Patient states had started having heavy drainage prior to admission.   Fistula pouching: Small Eakin's.   Education provided: Patient familiar with pouching, no new interventions at this time.   WOC will not follow at this time.  Re-consult for further needs.   Thank you,    Berley Gambrell MSN, RN-BC, [employer]

## 2022-11-30 NOTE — Consult Note (Signed)
[name] 1955/12/27  [id number].    Requesting MD: Dr. Posey Pronto Chief Complaint/Reason for Consult: partial small bowel obstruction  HPI:  [name] is a 67 yo male with a long-standing history of Crohn's Holt, for which he has had multiple abdominal surgeries in the past and has a chronic wound on the RLQ abdominal wall. He is followed by Dr. Alessandra Bevels at [facility]. He presented to the ED last night after new-onset seizures and was admitted. He was noted to have abdominal tenderness on exam and a KUB was obtained, which showed a loop of dilated small bowel. This prompted a CT scan, which showed a thickened segment of distal ileum with stranding, with focal small bowel dilation proximal to this area, concerning for partial obstruction. General surgery was consulted. An NG tube has been placed. At the time of my exam the patient is still post-ictal but responds to verbal stimuli and is able to answer some questions. He does endorse vomiting prior to admission, as well as abdominal pain on exam.  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  Ileo-ascending colon anastomosis 2001. - Takeback, washout, with creation of end ileostomy 2001 - Laparoscopic converted to open lysis adhesions with takedown of ileostomy.  Small bowel to transverse colon anastomosis, closure of parastomal hernia, complex abdominal repair with 30 x 20 cm Strattice biologic mesh 07/23/2015. - Incision and drainage of right-sided abdominal wall abscess with strong evidence of colocutaneous fistula 01/03/2017.  He was previously admitted in October 2023 with abdominal pain and increased drainage from his abdominal wall wound, and was seen by our group at that time, as well as GI. At that time he had similar inflammation of the distal ileum on imaging, and was treated with steroids while inpatient. There was  discussion of starting biologic therapy while outpatient, but it is not clear if this has been started.  ROS: Review of Systems  Gastrointestinal:  Positive for abdominal pain, nausea and vomiting.  Neurological:  Positive for seizures.    Family History  Problem Relation Age of Onset   [name] Father    Cancer Father    Cancer Sister     Past Medical History:  Diagnosis Date   Abdominal wall abscess 01/03/2017   Arthritis    Bladder stone    Cancer ([place])    mycosis fungoides   Crohn's Holt ([facility])    Crohn's Holt with abscess ([facility]) 07/23/2015   Diabetes mellitus without complication (HCC)    History of aseptic necrosis of bone BILATERAL HIPS   S/P BONE GRAFT   History of kidney stones    History of mycosis fungoides    S/P ileostomy ([place])     Past Surgical History:  Procedure Laterality Date   BONE GRAFT OF LEFT HIP  11/13/1984   ASEPTIC NECROSIS   COLONOSCOPY     CYSTO/ BILATERAL RETROGRADE PYELOGRAM/ LEFT URETERAL STONE EXTRACTION / BILATERAL STENT PLACEMENT  10/07/2003   CYSTOSCOPY WITH LITHOLAPAXY  02/12/2012   Procedure: CYSTOSCOPY WITH LITHOLAPAXY;  Surgeon: Claybon Jabs, MD;  Location: [facility];  Service: Urology;  Laterality: N/A;   EXPL. LAP. W/ ENTEROLYSIS, RESECTION INFLAMMATORY MASS RLQ / TAKE-DOWN OF FISTULA WITH ENTEROENTEROSTOMY/ RESECTION WITH THE SMALL BOWEL TO ASCENDING COLON ANASTOMOSIS  10/29/2000   CROHN'S Holt W/ ANASTOMOTIC INFLAMMATORY MASS/    10-30-2000 EXPL. LAP. CONTROL POST-OP ABD. BLEEDING  EXPLORATORY LAPAROTOMY/ RESECTION OF ILEOCOLONIC ANASTOMOSIS AND CREATION OF ILEOSTOMY  11/04/2000   ABD. PERFORATION   HARDWARE REMOVAL Left 08/07/2022   Procedure: HARDWARE REMOVAL;  Surgeon: Dorna Leitz, MD;  Location[facility];  Service: Orthopedics;  Laterality: Left;   [place] N/A 07/23/2015   Procedure: Takedown ileostomy and repair of ostomy hernia,  extensive entrolysis (2.5 hrs), ileostransverse colon anastomosis; closure of massive ventral hernia with 20 x 30 Strattice mesh;  Surgeon: Johnathan Hausen, MD;  [facility];  Service: General;  Laterality: N/A;   INCISIONAL HERNIA REPAIR  07/23/2015   Strattice mesh   IRRIGATION AND DEBRIDEMENT ABSCESS Right 09/09/2014   Procedure: MINOR INCISION AND DRAINAGE OF ABSCESS;  Surgeon: Charlotte Crumb, MD;  Location: [facility];  Service: Orthopedics;  Laterality: Right;  incision and drainage right long finger    IRRIGATION AND DEBRIDEMENT ABSCESS N/A 01/03/2017   Procedure: IRRIGATION AND DEBRIDEMENT ABDOMINAL WALL ABSCESS;  Surgeon: Johnathan Hausen, MD;  [facility];  Service: General;  Laterality: N/A;   LAPAROSCOPY N/A 07/23/2015   Procedure: LAPAROSCOPY DIAGNOSTIC;  Surgeon: Johnathan Hausen, MD;  [facility];  Service: General;  Laterality: N/A;   ORIF ANKLE FRACTURE Left 08/27/2014   Procedure:  open reduction internal fixation left ankle;  Surgeon: Nita Sells, MD;  Location: [facility];  Service: Orthopedics;  Laterality: Left;  Left open reduction internal fixation ankle   RIGHT URETEROSCOPIC STONE EXTRACITON  04-24-2005  & 12-01-2002   TOTAL HIP ARTHROPLASTY Left 08/07/2022   Procedure: TOTAL HIP ARTHROPLASTY ANTERIOR APPROACH;  Surgeon: Dorna Leitz, MD;  [facility];  Service: Orthopedics;  Laterality: Left;    Social History:  reports that he quit smoking about 28 years ago. His smoking use included cigarettes. He has never used smokeless tobacco. He reports that he does not drink alcohol and does not use drugs.  Allergies:  Allergies  Allergen Reactions   Nsaids Other (See Comments)    Crohn's Holt = NO NSAIDs    (Not in a hospital admission)    Physical Exam: Blood pressure 127/74, pulse 93, temperature 98.7 F (37.1 C), temperature source Oral, resp. rate 15, SpO2 100 %. General: resting comfortably, appears stated age, no  apparent distress Neurological: lethargic but rousable, answers questions HEENT: normocephalic, atraumatic CV: regular rate and rhythm Respiratory: normal work of breathing Abdomen: soft, nondistended, mild tenderness to palpation. Multiple surgical scars. Small wound on the RLQ abdominal wall with foul-smelling drainage on dressing, mild skin excoriation. Extremities: warm and well-perfused, no deformities   Results for orders placed or performed during the hospital encounter of 11/29/22 (from the past 48 hour(s))  CBG monitoring, ED     Status: Abnormal   Collection Time: 11/29/22  4:47 PM  Result Value Ref Range   Glucose-Capillary 197 (H) 70 - 99 mg/dL    Comment: Glucose reference range applies only to samples taken after fasting for at least 8 hours.  Resp panel by RT-PCR (RSV, Flu A&B, Covid) Anterior Nasal Swab     Status: None   Collection Time: 11/29/22  5:10 PM   Specimen: Anterior Nasal Swab  Result Value Ref Range   SARS Coronavirus 2 by RT PCR NEGATIVE NEGATIVE    Comment: (NOTE) SARS-CoV-2 target nucleic acids are NOT DETECTED.  The SARS-CoV-2 RNA is generally detectable in upper respiratory specimens during the acute phase of infection. The lowest concentration of SARS-CoV-2 viral copies this  assay can detect is 138 copies/mL. A negative result does not preclude SARS-Cov-2 infection and should not be used as the sole basis for treatment or other patient management decisions. A negative result may occur with  improper specimen collection/handling, submission of specimen other than nasopharyngeal swab, presence of viral mutation(s) within the areas targeted by this assay, and inadequate number of viral copies(<138 copies/mL). A negative result must be combined with clinical observations, patient history, and epidemiological information. The expected result is Negative.  Fact Sheet for Patients:  EntrepreneurPulse.com.au  Fact Sheet for Healthcare  Providers:  IncredibleEmployment.be  This test is no t yet approved or cleared by the [place] FDA and  has been authorized for detection and/or diagnosis of SARS-CoV-2 by FDA under an Emergency Use Authorization (EUA). This EUA will remain  in effect (meaning this test can be used) for the duration of the COVID-19 declaration under Section 564(b)(1) of the Act, 21 U.S.C.section 360bbb-3(b)(1), unless the authorization is terminated  or revoked sooner.       Influenza A by PCR NEGATIVE NEGATIVE   Influenza B by PCR NEGATIVE NEGATIVE    Comment: (NOTE) The Xpert Xpress SARS-CoV-2/FLU/RSV plus assay is intended as an aid in the diagnosis of influenza from Nasopharyngeal swab specimens and should not be used as a sole basis for treatment. Nasal washings and aspirates are unacceptable for Xpert Xpress SARS-CoV-2/FLU/RSV testing.  Fact Sheet for Patients: EntrepreneurPulse.com.au  Fact Sheet for Healthcare Providers: IncredibleEmployment.be  This test is not yet approved or cleared by the [place] FDA and has been authorized for detection and/or diagnosis of SARS-CoV-2 by FDA under an Emergency Use Authorization (EUA). This EUA will remain in effect (meaning this test can be used) for the duration of the COVID-19 declaration under Section 564(b)(1) of the Act, 21 U.S.C. section 360bbb-3(b)(1), unless the authorization is terminated or revoked.     Resp Syncytial Virus by PCR NEGATIVE NEGATIVE    Comment: (NOTE) Fact Sheet for Patients: EntrepreneurPulse.com.au  Fact Sheet for Healthcare Providers: IncredibleEmployment.be  This test is not yet approved or cleared by the [place] FDA and has been authorized for detection and/or diagnosis of SARS-CoV-2 by FDA under an Emergency Use Authorization (EUA). This EUA will remain in effect (meaning this test can be used) for the  duration of the COVID-19 declaration under Section 564(b)(1) of the Act, 21 U.S.C. section 360bbb-3(b)(1), unless the authorization is terminated or revoked.  Performed at [facility], [place] [address] [place], [place] [address]   Comprehensive metabolic panel     Status: Abnormal   Collection Time: 11/29/22  5:20 PM  Result Value Ref Range   Sodium 142 135 - 145 mmol/L   Potassium 3.6 3.5 - 5.1 mmol/L   Chloride 107 98 - 111 mmol/L   CO2 21 (L) 22 - 32 mmol/L   Glucose, Bld 220 (H) 70 - 99 mg/dL    Comment: Glucose reference range applies only to samples taken after fasting for at least 8 hours.   BUN 10 8 - 23 mg/dL   Creatinine, Ser 1.50 (H) 0.61 - 1.24 mg/dL   Calcium 8.6 (L) 8.9 - 10.3 mg/dL   Total Protein 6.2 (L) 6.5 - 8.1 g/dL   Albumin 3.2 (L) 3.5 - 5.0 g/dL   AST 26 15 - 41 U/L   ALT 11 0 - 44 U/L   Alkaline Phosphatase 52 38 - 126 U/L   Total Bilirubin 0.4 0.3 - 1.2 mg/dL   GFR,  Estimated 51 (L) >60 mL/min    Comment: (NOTE) Calculated using the CKD-EPI Creatinine Equation (2021)    Anion gap 14 5 - 15    Comment: Performed at [facility], [place] [address] [place], [place] [address]  Lactic acid, plasma     Status: Abnormal   Collection Time: 11/29/22  5:20 PM  Result Value Ref Range   Lactic Acid, Venous 7.3 (HH) 0.5 - 1.9 mmol/L    Comment: CRITICAL RESULT CALLED TO, READ BACK BY AND VERIFIED WITH E.ANELLO,RN '@[id number]'$  11/29/2022 [email or web address] Performed at [facility], [place] [address] [place], [place] [address]   CBC with Differential     Status: Abnormal   Collection Time: 11/29/22  5:20 PM  Result Value Ref Range   WBC 9.3 4.0 - 10.5 K/uL   RBC 4.59 4.22 - 5.81 MIL/uL   Hemoglobin 12.0 (L) 13.0 - 17.0 g/dL   HCT 39.2 39.0 - 52.0 %   MCV 85.4 80.0 - 100.0 fL   MCH 26.1 26.0 - 34.0 pg   MCHC 30.6 30.0 - 36.0 g/dL   RDW 15.8 (H) 11.5 - 15.5 %   Platelets 200 150 - 400 K/uL   nRBC 0.0 0.0 - 0.2 %   Neutrophils Relative % 88 %   Neutro Abs 8.2  (H) 1.7 - 7.7 K/uL   Lymphocytes Relative 5 %   Lymphs Abs 0.5 (L) 0.7 - 4.0 K/uL   Monocytes Relative 7 %   Monocytes Absolute 0.6 0.1 - 1.0 K/uL   Eosinophils Relative 0 %   Eosinophils Absolute 0.0 0.0 - 0.5 K/uL   Basophils Relative 0 %   Basophils Absolute 0.0 0.0 - 0.1 K/uL   Immature Granulocytes 0 %   Abs Immature Granulocytes 0.04 0.00 - 0.07 K/uL    Comment: Performed at [facility], [place] [address] [place], [place] [address]  Protime-INR     Status: None   Collection Time: 11/29/22  5:20 PM  Result Value Ref Range   Prothrombin Time 13.7 11.4 - 15.2 seconds   INR 1.1 0.8 - 1.2    Comment: (NOTE) INR goal varies based on device and Holt states. Performed at [facility], [place] [address] [place], [place] [address]   Ethanol     Status: None   Collection Time: 11/29/22  5:20 PM  Result Value Ref Range   Alcohol, Ethyl (B) <10 <10 mg/dL    Comment: (NOTE) Lowest detectable limit for serum alcohol is 10 mg/dL.  For medical purposes only. Performed at [facility], [place] [address] [place], [place] [address]   CBG monitoring, ED     Status: Abnormal   Collection Time: 11/29/22 10:09 PM  Result Value Ref Range   Glucose-Capillary 126 (H) 70 - 99 mg/dL    Comment: Glucose reference range applies only to samples taken after fasting for at least 8 hours.  Lactic acid, plasma     Status: None   Collection Time: 11/30/22  3:05 AM  Result Value Ref Range   Lactic Acid, Venous 1.0 0.5 - 1.9 mmol/L    Comment: Performed at [facility] [address], [place],  [address]  CBG monitoring, ED     Status: None   Collection Time: 11/30/22  3:07 AM  Result Value Ref Range   Glucose-Capillary 91 70 - 99 mg/dL    Comment: Glucose reference range applies only to samples taken after fasting for at  least 8 hours.  CBC     Status: Abnormal   Collection Time: 11/30/22  4:15 AM  Result Value Ref Range   WBC 6.3 4.0 - 10.5 K/uL   RBC 4.15 (L) 4.22  - 5.81 MIL/uL   Hemoglobin 11.2 (L) 13.0 - 17.0 g/dL   HCT 35.0 (L) 39.0 - 52.0 %   MCV 84.3 80.0 - 100.0 fL   MCH 27.0 26.0 - 34.0 pg   MCHC 32.0 30.0 - 36.0 g/dL   RDW 15.9 (H) 11.5 - 15.5 %   Platelets 171 150 - 400 K/uL   nRBC 0.0 0.0 - 0.2 %    Comment: Performed at [facility] [address], [place], [place] [address]  Comprehensive metabolic panel     Status: Abnormal   Collection Time: 11/30/22  4:15 AM  Result Value Ref Range   Sodium 141 135 - 145 mmol/L   Potassium 2.9 (L) 3.5 - 5.1 mmol/L   Chloride 110 98 - 111 mmol/L   CO2 22 22 - 32 mmol/L   Glucose, Bld 97 70 - 99 mg/dL    Comment: Glucose reference range applies only to samples taken after fasting for at least 8 hours.   BUN 10 8 - 23 mg/dL   Creatinine, Ser 0.99 0.61 - 1.24 mg/dL   Calcium 7.9 (L) 8.9 - 10.3 mg/dL   Total Protein 5.4 (L) 6.5 - 8.1 g/dL   Albumin 2.7 (L) 3.5 - 5.0 g/dL   AST 17 15 - 41 U/L   ALT 11 0 - 44 U/L   Alkaline Phosphatase 44 38 - 126 U/L   Total Bilirubin 0.4 0.3 - 1.2 mg/dL   GFR, Estimated >60 >60 mL/min    Comment: (NOTE) Calculated using the CKD-EPI Creatinine Equation (2021)    Anion gap 9 5 - 15    Comment: Performed at [facility], [place] [address], [place], [place] [address]  Urinalysis, Routine w reflex microscopic Urine, Clean Catch     Status: Abnormal   Collection Time: 11/30/22  5:09 AM  Result Value Ref Range   Color, Urine YELLOW YELLOW   APPearance CLEAR CLEAR   Specific Gravity, Urine 1.041 (H) 1.005 - 1.030   pH 6.0 5.0 - 8.0   Glucose, UA NEGATIVE NEGATIVE mg/dL   Hgb urine dipstick NEGATIVE NEGATIVE   Bilirubin Urine NEGATIVE NEGATIVE   Ketones, ur 5 (A) NEGATIVE mg/dL   Protein, ur NEGATIVE NEGATIVE mg/dL   Nitrite NEGATIVE NEGATIVE   Leukocytes,Ua NEGATIVE NEGATIVE    Comment: Performed at [facility] [address] [place], [place] [address]  Rapid urine drug screen (hospital performed)     Status: Abnormal   Collection Time:  11/30/22  5:09 AM  Result Value Ref Range   Opiates NONE DETECTED NONE DETECTED   Cocaine NONE DETECTED NONE DETECTED   Benzodiazepines POSITIVE (A) NONE DETECTED   Amphetamines NONE DETECTED NONE DETECTED   Tetrahydrocannabinol NONE DETECTED NONE DETECTED   Barbiturates NONE DETECTED NONE DETECTED    Comment: (NOTE) DRUG SCREEN FOR MEDICAL PURPOSES ONLY.  IF CONFIRMATION IS NEEDED FOR ANY PURPOSE, NOTIFY LAB WITHIN 5 DAYS.  LOWEST DETECTABLE LIMITS FOR URINE DRUG SCREEN Drug Class                     Cutoff (ng/mL) Amphetamine and metabolites    1000 Barbiturate and metabolites    200 Benzodiazepine  200 Opiates and metabolites        300 Cocaine and metabolites        300 THC                            50 Performed at [facility], [place] [address] [place], [place] [address]    DG Abdomen 1 View  Result Date: 11/30/2022 CLINICAL DATA:  Check gastric catheter placement EXAM: ABDOMEN - 1 VIEW COMPARISON:  None Available. FINDINGS: Gastric catheter extends into the stomach. Proximal side port is near the gastroesophageal junction. This could be advanced deeper into the stomach. Fullness of the left renal pelvis is noted without obstructive change. IMPRESSION: Gastric catheter as described. This could be advanced deeper into the stomach. Electronically Signed   By: Inez Catalina M.D.   On: 11/30/2022 02:06   CT ABDOMEN PELVIS W CONTRAST  Result Date: 11/29/2022 CLINICAL DATA:  Mildly dilated small bowel on recent plain film examination. EXAM: CT ABDOMEN AND PELVIS WITH CONTRAST TECHNIQUE: Multidetector CT imaging of the abdomen and pelvis was performed using the standard protocol following bolus administration of intravenous contrast. RADIATION DOSE REDUCTION: This exam was performed according to the departmental dose-optimization program which includes automated exposure control, adjustment of the mA and/or kV according to patient size and/or use of iterative  reconstruction technique. CONTRAST:  28m OMNIPAQUE IOHEXOL 350 MG/ML SOLN COMPARISON:  Plain film from earlier in the same day. CT from 09/10/2022 FINDINGS: Lower chest: No acute abnormality. Hepatobiliary: Liver again demonstrates multiple cysts. The largest of these bridges the medial and lateral segments of the left lobe of the liver. It measures up to 7.9 cm in greatest dimension. No focal mass is seen. The gallbladder is within normal limits. Pancreas: Unremarkable. No pancreatic ductal dilatation or surrounding inflammatory changes. Spleen: Normal in size without focal abnormality. Adrenals/Urinary Tract: Adrenal glands are stable in appearance. Normal enhancement of the kidneys is seen. Multiple left-sided renal calculi are identified measuring up to 14 mm in the upper pole. Punctate stones are noted on the right. Mild increased attenuation of the urine is noted related to recent gadolinium administration. No obstructive changes are seen. The bladder is partially distended. Stomach/Bowel: Scattered diverticular change of the colon is noted. Fluid is noted throughout the colon consistent with a diarrheal state. Postsurgical changes in the right abdomen are noted consistent with prior ileocolectomy and small bowel to colon anastomosis. This appears patent. The distal ileum shows significant wall thickening, luminal narrowing and surrounding inflammatory changes consistent with the patient's given clinical history of Crohn's Holt. Some fiber fatty deposition is noted as well. This appearance is similar to that seen on the most recent CT. Mildly dilated loops of small bowel are noted proximal to the distal ileum consistent with a partial small bowel obstruction. More proximal small bowel appears within normal limits. The stomach is decompressed. Vascular/Lymphatic: Aortic atherosclerosis. No enlarged abdominal or pelvic lymph nodes. Reproductive: Prostate is unremarkable. Other: No abdominal wall hernia or  abnormality. No abdominopelvic ascites. Musculoskeletal: Left hip replacement is noted. No acute bony abnormality is seen. IMPRESSION: Postsurgical changes in the right abdomen consistent with prior distal ileum and proximal right colon removal with small bowel to colonic anastomosis. Just prior to this anastomosis there is a segment of inflamed small bowel identified with generalized narrowing, wall thickening and surrounding inflammatory change. These changes are stable from the prior exam and consistent with the given clinical history.  The small bowel proximal to this diseased segment is dilated consistent with a partial small bowel obstruction. These changes are new from the prior exam. Bilateral renal calculi without complicating factors. Stable hepatic cysts. Electronically Signed   By: Inez Catalina M.D.   On: 11/29/2022 23:43   MR BRAIN W WO CONTRAST  Result Date: 11/29/2022 CLINICAL DATA:  New onset seizure EXAM: MRI HEAD WITHOUT AND WITH CONTRAST TECHNIQUE: Multiplanar, multiecho pulse sequences of the brain and surrounding structures were obtained without and with intravenous contrast. CONTRAST:  67m GADAVIST GADOBUTROL 1 MMOL/ML IV SOLN COMPARISON:  No prior MRI available, correlation is made with 11/29/2022 CT head FINDINGS: Evaluation is somewhat limited by motion artifact. Brain: No restricted diffusion to suggest acute or subacute infarct. No abnormal parenchymal or meningeal enhancement. No acute hemorrhage, mass, mass effect, or midline shift. No hydrocephalus or extra-axial collection.No hemosiderin deposition to suggest remote hemorrhage.Partial empty sella. Normal craniocervical junction. Scattered T2 hyperintense signal in the periventricular white matter, likely the sequela of mild chronic small vessel ischemic Holt. 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 The hippocampi are symmetric in size and signal. No heterotopia or evidence of cortical  dysgenesis. Vascular: Patent arterial flow voids. Normal arterial and venous enhancement. Skull and upper cervical spine: Normal marrow signal. Sinuses/Orbits: Clear paranasal sinuses.No acute finding in the orbits. Other: The mastoid air cells are well aerated. IMPRESSION: No acute intracranial process. No seizure etiology is seen. Electronically Signed   By: AMerilyn BabaM.D.   On: 11/29/2022 22:16   DG Abd 1 View  Result Date: 11/29/2022 CLINICAL DATA:  Abdominal pain and distention. EXAM: ABDOMEN - 1 VIEW COMPARISON:  08/28/2012, 09/10/2022. FINDINGS: There is a mildly distended loop of small bowel in the mid left abdomen measuring up to 3.5 cm. Stones are present in the left kidney measuring up to 1.1 cm. IMPRESSION: 1. Mildly distended loop of small bowel in the mid left abdomen measuring up to 3.5 cm, possible ileus versus obstruction. CT is recommended for further evaluation. 2. Left renal calculi. Electronically Signed   By: LBrett FairyM.D.   On: 11/29/2022 21:43   DG Chest Port 1 View  Result Date: 11/29/2022 CLINICAL DATA:  Sepsis, seizure EXAM: PORTABLE CHEST 1 VIEW COMPARISON:  07/31/2022 FINDINGS: Heavily reverse lordotic projection somewhat obscures the lung bases. Low lung volumes are present. Suspicion for left greater than right airspace opacity in the lower lobes. Heart size within normal limits for projection. Expected glenohumeral alignment noted bilaterally on this single view. IMPRESSION: 1. Suspicion for left greater than right airspace opacity in the lower lobes, potentially from pneumonia, atelectasis, or aspiration pneumonitis. Assessment of the lung bases is adversely affected by the reverse lordotic projection which can exaggerate basilar findings. Repeat radiography may be warranted when feasible. 2. Low lung volumes. Electronically Signed   By: WVan ClinesM.D.   On: 11/29/2022 17:35   CT Head Wo Contrast  Result Date: 11/29/2022 CLINICAL DATA:  Seizure EXAM: CT  HEAD WITHOUT CONTRAST TECHNIQUE: Contiguous axial images were obtained from the base of the skull through the vertex without intravenous contrast. RADIATION DOSE REDUCTION: This exam was performed according to the departmental dose-optimization program which includes automated exposure control, adjustment of the mA and/or kV according to patient size and/or use of iterative reconstruction technique. COMPARISON:  None Available. FINDINGS: Brain: Chronic appearing lacunar infarct within the right thalamus/posterior limb of the internal capsule. No evidence of acute infarct or hemorrhage. Lateral  ventricles and remaining midline structures are unremarkable. No acute extra-axial fluid collections. No mass effect. Vascular: No hyperdense vessel or unexpected calcification. Skull: Normal. Negative for fracture or focal lesion. Sinuses/Orbits: No acute finding. Other: None. IMPRESSION: 1. Chronic right thalamic lacunar infarct. No acute intracranial process. Electronically Signed   By: Randa Ngo M.D.   On: 11/29/2022 17:14   VAS [place] LOWER EXTREMITY VENOUS (DVT)  Result Date: 11/28/2022  Lower Venous DVT Study Patient Name:  PEDRO WHITERS  Date of Exam:   11/28/2022 Medical Rec #: [id number]         Accession #:    [id number] Date of Birth: 1956-03-03         Patient Gender: M Patient Age:   40 years Exam Location:  [facility] Procedure:      VAS [place] LOWER EXTREMITY VENOUS (DVT) Referring Phys: Jenny Reichmann GRAVES --------------------------------------------------------------------------------  Indications: Pain, and Swelling.  Risk Factors: Surgery Left hip replacement 08/07/2022. Comparison Study: No prior exam. Performing Technologist: Alvia Grove RVT  Examination Guidelines: A complete evaluation includes B-mode imaging, spectral Doppler, color Doppler, and power Doppler as needed of all accessible portions of each vessel. Bilateral testing is considered an integral part of a complete examination.  Limited examinations for reoccurring indications may be performed as noted. The reflux portion of the exam is performed with the patient in reverse Trendelenburg.  +-----+---------------+---------+-----------+----------+--------------+ RIGHTCompressibilityPhasicitySpontaneityPropertiesThrombus Aging +-----+---------------+---------+-----------+----------+--------------+ CFV  Full           No       No                                  +-----+---------------+---------+-----------+----------+--------------+   +---------+---------------+---------+-----------+----------+--------------+ LEFT     CompressibilityPhasicitySpontaneityPropertiesThrombus Aging +---------+---------------+---------+-----------+----------+--------------+ CFV      Full           Yes      Yes                                 +---------+---------------+---------+-----------+----------+--------------+ SFJ      Full           Yes      Yes                                 +---------+---------------+---------+-----------+----------+--------------+ FV Prox  Full           Yes      Yes                                 +---------+---------------+---------+-----------+----------+--------------+ FV Mid   Full           Yes      Yes                                 +---------+---------------+---------+-----------+----------+--------------+ FV DistalFull           Yes      Yes                                 +---------+---------------+---------+-----------+----------+--------------+ PFV      Full  Yes      Yes                                 +---------+---------------+---------+-----------+----------+--------------+ POP      Full           Yes      Yes                                 +---------+---------------+---------+-----------+----------+--------------+ PTV      Full           Yes      Yes                                  +---------+---------------+---------+-----------+----------+--------------+ PERO     Full           Yes      Yes                  NWV            +---------+---------------+---------+-----------+----------+--------------+ Gastroc  Full           Yes      Yes                                 +---------+---------------+---------+-----------+----------+--------------+ GSV      Full           Yes      Yes                                 +---------+---------------+---------+-----------+----------+--------------+ SSV      Full           Yes      Yes                                 +---------+---------------+---------+-----------+----------+--------------+    Findings reported to [facility] at 12:35.  Summary: LEFT: - There is no evidence of deep vein thrombosis in the lower extremity. - There is no evidence of superficial venous thrombosis.  - No cystic structure found in the popliteal fossa.  *See table(s) above for measurements and observations. Electronically signed by Monica Martinez MD on 11/28/2022 at 4:03:51 PM.    Final       Assessment/Plan 67 yo male with long-standing Crohn's Holt and a complex surgical history, presenting with new-onset seizures. Noted to have focal small bowel dilation on imaging concerning for a partial SBO, with reported emesis. I have personally reviewed his labs, imaging and notes. His CT scan shows thickening of a segment of ileum proximal to his ileocolonic anastomosis, similar in appearance to prior imaging in October and consistent with ongoing Crohn's. He does have focal small bowel dilation. The proximity of the colon to his RLQ wound is highly suspicious for a colocutaneous fistula, although output seems to be low. Recommend a GI consult for medical optimization of his Crohn's Holt. Surgery would be complex and high risk given his prior history. Also recommend a wound care consult to pouch the abdominal wall wound, both to protect the skin and  monitor output. Patient has previously pouched this area in the past.  Can continue NG decompression for now, but if output is minimal and patient has bowel function, NG can be removed as small bowel dilation is mild and focal. No significant distension on exam. Surgery will follow.   Michaelle Birks, MD [facility] General, Hepatobiliary and Pancreatic Surgery 11/30/22 6:27 AM

## 2022-11-30 NOTE — Progress Notes (Signed)
Subjective: No acute events overnight.  States he is feeling much better than yesterday.  States the last milligrams yesterday is vomiting a lot.  Denies ever having seizures in the past. Denies recent illness, headache, focal neurological deficits. Denies family history of epilepsy, recent head injury, new medications.  ROS: negative except above  Examination  Vital signs in last 24 hours: Temp:  [98.5 F (36.9 C)-98.8 F (37.1 C)] 98.5 F (36.9 C) (01/18 0906) Pulse Rate:  [76-116] 76 (01/18 1100) Resp:  [14-42] 14 (01/18 1100) BP: (114-152)/(74-108) 145/83 (01/18 1100) SpO2:  [95 %-100 %] 100 % (01/18 1100)  General: lying in bed, NAD Neuro: AOx3, no aphasia, cranial nerves II to XII grossly intact, 4/5 in bilateral upper extremities, 3/5 in lateral lower extremities, FTN intact bilaterally, sensation intact to light touch.  Basic Metabolic Panel: Recent Labs  Lab 11/29/22 1720 11/30/22 0415  NA 142 141  K 3.6 2.9*  CL 107 110  CO2 21* 22  GLUCOSE 220* 97  BUN 10 10  CREATININE 1.50* 0.99  CALCIUM 8.6* 7.9*  MG  --  1.6*    CBC: Recent Labs  Lab 11/29/22 1720 11/30/22 0415  WBC 9.3 6.3  NEUTROABS 8.2*  --   HGB 12.0* 11.2*  HCT 39.2 35.0*  MCV 85.4 84.3  PLT 200 171    Coagulation Studies: Recent Labs    11/29/22 1720  LABPROT 13.7  INR 1.1    Imaging MRI brain with and without contrast 11/29/2022: No acute intracranial close process.  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  The semiology of the seizure was described as left gaze deviation, flexion of left arm followed by generalized tonic-clonic seizure, associated with tongue bite.  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  New onset focal seizures Hypomagnesemia Hypokalemia Hypoproteinemia with  hypoalbuminemia Microcytic anemia Lactic acidosis (resolved) -Etiology of seizure: No infection, no alcohol/drug use.  Does have  hypomagnesemia but not significant enough to provoke a seizure.   Recommendations -Last seizure was focal in onset, would recommend continuing Keppra '750mg'$  twice daily. -Will likely continue LTM EEG tomorrow if no seizures overnight -Continue seizure precautions -As needed IV Versed for clinical seizure lasting more than 2 minutes -Management of rest of comorbidities per primary team  I have spent a total of  36  minutes with the patient reviewing hospital notes,  test results, labs and examining the patient as well as establishing an assessment and plan that was discussed personally with the patient.  > 50% of time was spent in direct patient care.   Zeb Comfort Epilepsy [facility] For questions after 5pm please refer to AMION to reach the Neurologist on call

## 2022-11-30 NOTE — Progress Notes (Signed)
CORA STETSON  DXA:[id number] DOB: 03-07-1956 DOA: 11/29/2022 PCP: [name], MD    Brief Narrative:  67 year old with a history of Crohn's disease with ileocolonic fistula and multiple intra-abdominal surgeries as well as DM2 with no known prior history of seizure activity who presented to the ED after having seizure activity.  He was at home speaking to his spouse when he suddenly became unresponsive.  She lowered him to the ground and then witnessed him exhibiting generalized convulsions.  He bit his tongue.  When EMS arrived he was still convulsing.  He was dosed with Versed.  He had repeat seizure activity upon arrival to the ER.  CT head noted a chronic right thalamic lacunar infarct without acute findings.  The patient was loaded with Keppra and Ativan and Neurology was consulted.    Consultants:  Neurology General Surgery  Goals of Care:  Code Status: Full Code   DVT prophylaxis: Subcu heparin  Interim Hx: Afebrile.  Vital signs stable.  States that he is feeling much better overall with Tylenol presentation.  Reports some low-grade nausea but no vomiting.  Is tolerating his NG tube well.  Is thus far not passing flatus or moving his bowels but states that his abdomen feels much better overall.  There is not much output in his NG suction canister.  Assessment & Plan:  New onset seizure activity Care per neurology -has been loaded with Keppra -MRI brain revealed no acute findings  Aspiration pneumonitis CXR at presentation concerning for probable aspiration event -dosed with empiric Unasyn initially but will hold further dosing and monitor clinically for now  Acute kidney injury Creatinine 1.5 at admission -monitor trend with volume resuscitation  Hypokalemia Check magnesium -supplement potassium  Lactic acidosis Due to seizure activity -hydrate  Crohn's disease with complicated abdominal history -acute partial SBO New partial SBO suggested on CT abdomen obtained due to  abdominal tenderness on admission exam - General Surgery following -small bowel dilatation is felt to be mild, focal, and likely chronic in nature  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 rate 15, SpO2 100 %.  Intake/Output Summary (Last 24 hours) at 11/30/2022 0823 Last data filed at 11/30/2022 0630 Gross per 24 hour  Intake 1356.38 ml  Output --  Net 1356.38 ml   There were no vitals filed for this visit.  Examination: General: No acute respiratory distress Lungs: Clear to auscultation bilaterally without wheezes or crackles Cardiovascular: Regular rate and rhythm without murmur gallop or rub normal S1 and S2 Abdomen: Nontender, soft, very mildly distended without rebound, no appreciable mass, ostomy bag in place over his small draining wound which Extremities: No significant cyanosis, clubbing, or edema bilateral lower extremities  CBC: Recent Labs  Lab 11/29/22 1720 11/30/22 0415  WBC 9.3 6.3  NEUTROABS 8.2*  --   HGB 12.0* 11.2*  HCT 39.2 35.0*  MCV 85.4 84.3  PLT 200 209   Basic Metabolic Panel: Recent Labs  Lab 11/29/22 1720 11/30/22 0415  NA 142 141  K 3.6 2.9*  CL 107 110  CO2 21* 22  GLUCOSE 220* 97  BUN 10 10  CREATININE 1.50* 0.99  CALCIUM 8.6* 7.9*   GFR: CrCl cannot be calculated (Unknown ideal weight.).   Scheduled Meds:  heparin  5,000 Units Subcutaneous Q8H   insulin aspart  0-9 Units Subcutaneous Q4H   Continuous Infusions:  sodium chloride 100 mL/hr at 11/30/22 0630   ampicillin-sulbactam (UNASYN) IV Stopped (11/30/22 0520)    levETIRAcetam Stopped (11/30/22 0600)     LOS: 1 day   [name], MD [facility] Office  [phone] Pager - Text Page per Shea Evans  If 7PM-7AM, please contact night-coverage per Amion 11/30/2022, 8:23 AM

## 2022-11-30 NOTE — Progress Notes (Signed)
LTM EEG hooked up and running - no initial skin breakdown - push button tested   

## 2022-11-30 NOTE — Progress Notes (Signed)
vLTM maintenance  All impedances below 10kohm.  No skin breakdown noted at all skin sites

## 2022-11-30 NOTE — ED Notes (Signed)
ED TO INPATIENT HANDOFF REPORT  ED Nurse Name and Phone #: Suella Grove [phone]  S Name/Age/Gender [name] 67 y.o. male Room/Bed: 036C/036C  Code Status   Code Status: Full Code  Home/SNF/Other Home Patient oriented to: self, place, and time Is this baseline? Yes   Triage Complete: Triage complete  Chief Complaint New onset seizure without head trauma ([place]) [R56.9] Seizure ([place]) [R56.9]  Triage Note Pt BIBGEMS from home where he had a witnessed seizure. Wife helped patient to ground, bit his tongue. Pt seizing with EMS approx 2 minutes ( violent ) , 5 versed IM ( 15 min ago )  No hx seizure  Hx abdominal surgery   20 cc suctioned with EMS  CBG 174 HR 120s BP 103/67  Deviated eye gaze to the left    Allergies Allergies  Allergen Reactions   Nsaids Other (See Comments)    Crohn's disease, told to avoid NSAIDs    Level of Care/Admitting Diagnosis ED Disposition     ED Disposition  Admit   Condition  --   [place]: [facility] [[id number]]  Level of Care: Telemetry Medical [[id number]]  May admit patient to [facility] or [facility] if equivalent level of care is available:: No  Covid Evaluation: Confirmed COVID Negative  Diagnosis: Seizure ([place]) [[id number]]  Admitting Physician: [name] [[id number]]  Attending Physician: Thereasa Solo, JEFFREY T [[id number]]  Certification:: I certify this patient will need inpatient services for at least 2 midnights          B Medical/Surgery History Past Medical History:  Diagnosis Date   Abdominal wall abscess 01/03/2017   Arthritis    Bladder stone    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   Crohn's disease ([place])    Crohn's disease with abscess ([place]) 07/23/2015   Diabetes mellitus without complication ([place])    History of aseptic necrosis of bone BILATERAL HIPS   S/P BONE GRAFT   History of kidney stones    History of mycosis fungoides    S/P ileostomy ([place])    Past Surgical History:   Procedure Laterality Date   BONE GRAFT OF LEFT HIP  11/13/1984   ASEPTIC NECROSIS   COLONOSCOPY     CYSTO/ BILATERAL RETROGRADE PYELOGRAM/ LEFT URETERAL STONE EXTRACTION / BILATERAL STENT PLACEMENT  10/07/2003   CYSTOSCOPY WITH LITHOLAPAXY  02/12/2012   Procedure: CYSTOSCOPY WITH LITHOLAPAXY;  Surgeon: Claybon Jabs, MD;  Location: [facility];  Service: Urology;  Laterality: N/A;   EXPL. LAP. W/ ENTEROLYSIS, RESECTION INFLAMMATORY MASS RLQ / TAKE-DOWN OF FISTULA WITH ENTEROENTEROSTOMY/ RESECTION WITH THE SMALL BOWEL TO ASCENDING COLON ANASTOMOSIS  10/29/2000   CROHN'S DISEASE W/ ANASTOMOTIC INFLAMMATORY MASS/    10-30-2000 EXPL. LAP. CONTROL POST-OP ABD. BLEEDING   EXPLORATORY LAPAROTOMY/ RESECTION OF ILEOCOLONIC ANASTOMOSIS AND CREATION OF ILEOSTOMY  11/04/2000   ABD. PERFORATION   HARDWARE REMOVAL Left 08/07/2022   Procedure: HARDWARE REMOVAL;  Surgeon: Dorna Leitz, MD;  Location[facility];  Service: Orthopedics;  Laterality: Left;   [place] N/A 07/23/2015   Procedure: Takedown ileostomy and repair of ostomy hernia, extensive entrolysis (2.5 hrs), ileostransverse colon anastomosis; closure of massive ventral hernia with 20 x 30 Strattice mesh;  Surgeon: Johnathan Hausen, MD;  Location[facility];  Service: General;  Laterality: N/A;   INCISIONAL HERNIA REPAIR  07/23/2015   Strattice mesh   IRRIGATION AND DEBRIDEMENT ABSCESS Right  09/09/2014   Procedure: MINOR INCISION AND DRAINAGE OF ABSCESS;  Surgeon: Charlotte Crumb, MD;  Location: [facility];  Service: Orthopedics;  Laterality: Right;  incision and drainage right long finger    IRRIGATION AND DEBRIDEMENT ABSCESS N/A 01/03/2017   Procedure: IRRIGATION AND DEBRIDEMENT ABDOMINAL WALL ABSCESS;  Surgeon: Johnathan Hausen, MD;  [facility];  Service: General;  Laterality: N/A;   LAPAROSCOPY N/A 07/23/2015   Procedure: LAPAROSCOPY DIAGNOSTIC;  Surgeon:  Johnathan Hausen, MD;  [facility];  Service: General;  Laterality: N/A;   ORIF ANKLE FRACTURE Left 08/27/2014   Procedure:  open reduction internal fixation left ankle;  Surgeon: Nita Sells, MD;  Location: [facility];  Service: Orthopedics;  Laterality: Left;  Left open reduction internal fixation ankle   RIGHT URETEROSCOPIC STONE EXTRACITON  04-24-2005  & 12-01-2002   TOTAL HIP ARTHROPLASTY Left 08/07/2022   Procedure: TOTAL HIP ARTHROPLASTY ANTERIOR APPROACH;  Surgeon: Dorna Leitz, MD;  [facility];  Service: Orthopedics;  Laterality: Left;     A IV Location/Drains/Wounds Patient Lines/Drains/Airways Status     Active Line/Drains/Airways     Name Placement date Placement time Site Days   Peripheral IV 11/29/22 20 G Left Antecubital 11/29/22  --  Antecubital  1   Peripheral IV 11/29/22 18 G Right Antecubital 11/29/22  1721  Antecubital  1   Peripheral IV 11/29/22 20 G Anterior;Distal;Right Forearm 11/29/22  1720  Forearm  1   NG/OG Vented/Dual Lumen 14 Fr. Left nare External length of tube 60 cm 11/30/22  0153  Left nare  less than 1   External Urinary Catheter 11/30/22  0152  --  less than 1   Incision - 2 Ports Abdomen Left;Lateral Lower;Mid 07/23/15  1040  -- 2687   Wound / Incision (Open or Dehisced) 07/25/15 Other (Comment) Abdomen Right;Upper former ileostomy site 07/25/15  2000  Abdomen  2685   Wound / Incision (Open or Dehisced) 09/10/22 Non-pressure wound Abdomen Lower;Medial;Right .5x.2 cm 09/10/22  0830  Abdomen  81            Intake/Output Last 24 hours  Intake/Output Summary (Last 24 hours) at 11/30/2022 1806 Last data filed at 11/30/2022 0630 Gross per 24 hour  Intake 1356.38 ml  Output --  Net 1356.38 ml    Labs/Imaging Results for orders placed or performed during the hospital encounter of 11/29/22 (from the past 48 hour(s))  CBG monitoring, ED     Status: Abnormal   Collection Time: 11/29/22  4:47 PM  Result Value Ref Range    Glucose-Capillary 197 (H) 70 - 99 mg/dL    Comment: Glucose reference range applies only to samples taken after fasting for at least 8 hours.  Resp panel by RT-PCR (RSV, Flu A&B, Covid) Anterior Nasal Swab     Status: None   Collection Time: 11/29/22  5:10 PM   Specimen: Anterior Nasal Swab  Result Value Ref Range   SARS Coronavirus 2 by RT PCR NEGATIVE NEGATIVE    Comment: (NOTE) SARS-CoV-2 target nucleic acids are NOT DETECTED.  The SARS-CoV-2 RNA is generally detectable in upper respiratory specimens during the acute phase of infection. The lowest concentration of SARS-CoV-2 viral copies this assay can detect is 138 copies/mL. A negative result does not preclude SARS-Cov-2 infection and should not be used as the sole basis for treatment or other patient management decisions. A negative result may occur with  improper specimen collection/handling, submission of specimen other than nasopharyngeal swab, presence of viral  mutation(s) within the areas targeted by this assay, and inadequate number of viral copies(<138 copies/mL). A negative result must be combined with clinical observations, patient history, and epidemiological information. The expected result is Negative.  Fact Sheet for Patients:  EntrepreneurPulse.com.au  Fact Sheet for Healthcare Providers:  IncredibleEmployment.be  This test is no t yet approved or cleared by the [place] FDA and  has been authorized for detection and/or diagnosis of SARS-CoV-2 by FDA under an Emergency Use Authorization (EUA). This EUA will remain  in effect (meaning this test can be used) for the duration of the COVID-19 declaration under Section 564(b)(1) of the Act, 21 U.S.C.section 360bbb-3(b)(1), unless the authorization is terminated  or revoked sooner.       Influenza A by PCR NEGATIVE NEGATIVE   Influenza B by PCR NEGATIVE NEGATIVE    Comment: (NOTE) The Xpert Xpress SARS-CoV-2/FLU/RSV plus  assay is intended as an aid in the diagnosis of influenza from Nasopharyngeal swab specimens and should not be used as a sole basis for treatment. Nasal washings and aspirates are unacceptable for Xpert Xpress SARS-CoV-2/FLU/RSV testing.  Fact Sheet for Patients: EntrepreneurPulse.com.au  Fact Sheet for Healthcare Providers: IncredibleEmployment.be  This test is not yet approved or cleared by the [place] FDA and has been authorized for detection and/or diagnosis of SARS-CoV-2 by FDA under an Emergency Use Authorization (EUA). This EUA will remain in effect (meaning this test can be used) for the duration of the COVID-19 declaration under Section 564(b)(1) of the Act, 21 U.S.C. section 360bbb-3(b)(1), unless the authorization is terminated or revoked.     Resp Syncytial Virus by PCR NEGATIVE NEGATIVE    Comment: (NOTE) Fact Sheet for Patients: EntrepreneurPulse.com.au  Fact Sheet for Healthcare Providers: IncredibleEmployment.be  This test is not yet approved or cleared by the [place] FDA and has been authorized for detection and/or diagnosis of SARS-CoV-2 by FDA under an Emergency Use Authorization (EUA). This EUA will remain in effect (meaning this test can be used) for the duration of the COVID-19 declaration under Section 564(b)(1) of the Act, 21 U.S.C. section 360bbb-3(b)(1), unless the authorization is terminated or revoked.  Performed at [facility], [place] [address] [place], [place] [address]   Comprehensive metabolic panel     Status: Abnormal   Collection Time: 11/29/22  5:20 PM  Result Value Ref Range   Sodium 142 135 - 145 mmol/L   Potassium 3.6 3.5 - 5.1 mmol/L   Chloride 107 98 - 111 mmol/L   CO2 21 (L) 22 - 32 mmol/L   Glucose, Bld 220 (H) 70 - 99 mg/dL    Comment: Glucose reference range applies only to samples taken after fasting for at least 8 hours.   BUN 10 8 -  23 mg/dL   Creatinine, Ser 1.50 (H) 0.61 - 1.24 mg/dL   Calcium 8.6 (L) 8.9 - 10.3 mg/dL   Total Protein 6.2 (L) 6.5 - 8.1 g/dL   Albumin 3.2 (L) 3.5 - 5.0 g/dL   AST 26 15 - 41 U/L   ALT 11 0 - 44 U/L   Alkaline Phosphatase 52 38 - 126 U/L   Total Bilirubin 0.4 0.3 - 1.2 mg/dL   GFR, Estimated 51 (L) >60 mL/min    Comment: (NOTE) Calculated using the CKD-EPI Creatinine Equation (2021)    Anion gap 14 5 - 15    Comment: Performed at [facility], [place] [address] [place], [place] [address]  Lactic acid, plasma  Status: Abnormal   Collection Time: 11/29/22  5:20 PM  Result Value Ref Range   Lactic Acid, Venous 7.3 (HH) 0.5 - 1.9 mmol/L    Comment: CRITICAL RESULT CALLED TO, READ BACK BY AND VERIFIED WITH E.ANELLO,RN '@1805'$  11/29/2022 [email or web address] Performed at [facility], [place] [address], [place], [place] [address]   CBC with Differential     Status: Abnormal   Collection Time: 11/29/22  5:20 PM  Result Value Ref Range   WBC 9.3 4.0 - 10.5 K/uL   RBC 4.59 4.22 - 5.81 MIL/uL   Hemoglobin 12.0 (L) 13.0 - 17.0 g/dL   HCT 39.2 39.0 - 52.0 %   MCV 85.4 80.0 - 100.0 fL   MCH 26.1 26.0 - 34.0 pg   MCHC 30.6 30.0 - 36.0 g/dL   RDW 15.8 (H) 11.5 - 15.5 %   Platelets 200 150 - 400 K/uL   nRBC 0.0 0.0 - 0.2 %   Neutrophils Relative % 88 %   Neutro Abs 8.2 (H) 1.7 - 7.7 K/uL   Lymphocytes Relative 5 %   Lymphs Abs 0.5 (L) 0.7 - 4.0 K/uL   Monocytes Relative 7 %   Monocytes Absolute 0.6 0.1 - 1.0 K/uL   Eosinophils Relative 0 %   Eosinophils Absolute 0.0 0.0 - 0.5 K/uL   Basophils Relative 0 %   Basophils Absolute 0.0 0.0 - 0.1 K/uL   Immature Granulocytes 0 %   Abs Immature Granulocytes 0.04 0.00 - 0.07 K/uL    Comment: Performed at [facility], [place] [address] [place], [place] [address]  Protime-INR     Status: None   Collection Time: 11/29/22  5:20 PM  Result Value Ref Range   Prothrombin Time 13.7 11.4 - 15.2 seconds   INR 1.1 0.8 - 1.2    Comment: (NOTE) INR  goal varies based on device and disease states. Performed at [facility], [place] [address], [place], [place] [address]   Culture, blood (Routine x 2)     Status: None (Preliminary result)   Collection Time: 11/29/22  5:20 PM   Specimen: BLOOD RIGHT ARM  Result Value Ref Range   Specimen Description BLOOD RIGHT ARM    Special Requests      BOTTLES DRAWN AEROBIC AND ANAEROBIC Blood Culture adequate volume   Culture      NO GROWTH < 24 HOURS Performed at [facility], [place] [address] [place], [place] [address]    Report Status PENDING   Culture, blood (Routine x 2)     Status: None (Preliminary result)   Collection Time: 11/29/22  5:20 PM   Specimen: BLOOD LEFT ARM  Result Value Ref Range   Specimen Description BLOOD LEFT ARM    Special Requests      BOTTLES DRAWN AEROBIC ONLY Blood Culture results may not be optimal due to an inadequate volume of blood received in culture bottles   Culture      NO GROWTH < 24 HOURS Performed at [facility] [address] [place], [place] [address]    Report Status PENDING   Ethanol     Status: None   Collection Time: 11/29/22  5:20 PM  Result Value Ref Range   Alcohol, Ethyl (B) <10 <10 mg/dL    Comment: (NOTE) Lowest detectable limit for serum alcohol is 10 mg/dL.  For medical purposes only. Performed at [facility], [place] [address] [place],  [address]  CBG monitoring, ED     Status: Abnormal   Collection Time: 11/29/22 10:09 PM  Result Value Ref Range   Glucose-Capillary 126 (H) 70 - 99 mg/dL    Comment: Glucose reference range applies only to samples taken after fasting for at least 8 hours.  Lactic acid, plasma     Status: None   Collection Time: 11/30/22  3:05 AM  Result Value Ref Range   Lactic Acid, Venous 1.0 0.5 - 1.9 mmol/L    Comment: Performed at [facility] [address] [place], [place] [address]  CBG monitoring, ED     Status: None   Collection Time: 11/30/22  3:07 AM  Result  Value Ref Range   Glucose-Capillary 91 70 - 99 mg/dL    Comment: Glucose reference range applies only to samples taken after fasting for at least 8 hours.  CBC     Status: Abnormal   Collection Time: 11/30/22  4:15 AM  Result Value Ref Range   WBC 6.3 4.0 - 10.5 K/uL   RBC 4.15 (L) 4.22 - 5.81 MIL/uL   Hemoglobin 11.2 (L) 13.0 - 17.0 g/dL   HCT 35.0 (L) 39.0 - 52.0 %   MCV 84.3 80.0 - 100.0 fL   MCH 27.0 26.0 - 34.0 pg   MCHC 32.0 30.0 - 36.0 g/dL   RDW 15.9 (H) 11.5 - 15.5 %   Platelets 171 150 - 400 K/uL   nRBC 0.0 0.0 - 0.2 %    Comment: Performed at [facility] [address] [place], [place] [address]  Comprehensive metabolic panel     Status: Abnormal   Collection Time: 11/30/22  4:15 AM  Result Value Ref Range   Sodium 141 135 - 145 mmol/L   Potassium 2.9 (L) 3.5 - 5.1 mmol/L   Chloride 110 98 - 111 mmol/L   CO2 22 22 - 32 mmol/L   Glucose, Bld 97 70 - 99 mg/dL    Comment: Glucose reference range applies only to samples taken after fasting for at least 8 hours.   BUN 10 8 - 23 mg/dL   Creatinine, Ser 0.99 0.61 - 1.24 mg/dL   Calcium 7.9 (L) 8.9 - 10.3 mg/dL   Total Protein 5.4 (L) 6.5 - 8.1 g/dL   Albumin 2.7 (L) 3.5 - 5.0 g/dL   AST 17 15 - 41 U/L   ALT 11 0 - 44 U/L   Alkaline Phosphatase 44 38 - 126 U/L   Total Bilirubin 0.4 0.3 - 1.2 mg/dL   GFR, Estimated >60 >60 mL/min    Comment: (NOTE) Calculated using the CKD-EPI Creatinine Equation (2021)    Anion gap 9 5 - 15    Comment: Performed at [facility], [place] [address] [place], [place] [address]  Magnesium     Status: Abnormal   Collection Time: 11/30/22  4:15 AM  Result Value Ref Range   Magnesium 1.6 (L) 1.7 - 2.4 mg/dL    Comment: Performed at [facility] [address] [place], [place] [address]  Urinalysis, Routine w reflex microscopic Urine, Clean Catch     Status: Abnormal   Collection Time: 11/30/22  5:09 AM  Result Value Ref Range   Color, Urine YELLOW YELLOW   APPearance  CLEAR CLEAR   Specific Gravity, Urine 1.041 (H) 1.005 - 1.030   pH 6.0 5.0 - 8.0   Glucose, UA NEGATIVE NEGATIVE mg/dL   Hgb urine dipstick NEGATIVE NEGATIVE  Bilirubin Urine NEGATIVE NEGATIVE   Ketones, ur 5 (A) NEGATIVE mg/dL   Protein, ur NEGATIVE NEGATIVE mg/dL   Nitrite NEGATIVE NEGATIVE   Leukocytes,Ua NEGATIVE NEGATIVE    Comment: Performed at [facility] [address] [place], [place] [address]  Rapid urine drug screen (hospital performed)     Status: Abnormal   Collection Time: 11/30/22  5:09 AM  Result Value Ref Range   Opiates NONE DETECTED NONE DETECTED   Cocaine NONE DETECTED NONE DETECTED   Benzodiazepines POSITIVE (A) NONE DETECTED   Amphetamines NONE DETECTED NONE DETECTED   Tetrahydrocannabinol NONE DETECTED NONE DETECTED   Barbiturates NONE DETECTED NONE DETECTED    Comment: (NOTE) DRUG SCREEN FOR MEDICAL PURPOSES ONLY.  IF CONFIRMATION IS NEEDED FOR ANY PURPOSE, NOTIFY LAB WITHIN 5 DAYS.  LOWEST DETECTABLE LIMITS FOR URINE DRUG SCREEN Drug Class                     Cutoff (ng/mL) Amphetamine and metabolites    1000 Barbiturate and metabolites    200 Benzodiazepine                 200 Opiates and metabolites        300 Cocaine and metabolites        300 THC                            50 Performed at [facility], [place] [address], [place],  [address]   CBG monitoring, ED     Status: None   Collection Time: 11/30/22  8:13 AM  Result Value Ref Range   Glucose-Capillary 88 70 - 99 mg/dL    Comment: Glucose reference range applies only to samples taken after fasting for at least 8 hours.  CBG monitoring, ED     Status: None   Collection Time: 11/30/22 11:21 AM  Result Value Ref Range   Glucose-Capillary 96 70 - 99 mg/dL    Comment: Glucose reference range applies only to samples taken after fasting for at least 8 hours.  CBG monitoring, ED     Status: None   Collection Time: 11/30/22  5:32 PM  Result Value Ref Range    Glucose-Capillary 95 70 - 99 mg/dL    Comment: Glucose reference range applies only to samples taken after fasting for at least 8 hours.   Overnight EEG with video  Result Date: 11/30/2022 Lora Havens, MD     11/30/2022  8:36 AM Patient Name: DAYVIN ABER MRN: [id number] Epilepsy Attending: Lora Havens Referring Physician/Provider: Derek Jack, MD Duration: 11/30/2022 0211 to 0815 Patient history: 67 year old male with new onset status epilepticus.  EEG to evaluate for seizure. Level of alertness: Awake, asleep AEDs during EEG study: LEV Technical aspects: This EEG study was done with scalp electrodes positioned according to the 10-20 International system of electrode placement. Electrical activity was reviewed with band pass filter of 1-'70Hz'$ , sensitivity of 7 uV/mm, display speed of 58m/sec with a '60Hz'$  notched filter applied as appropriate. EEG data were recorded continuously and digitally stored.  Video monitoring was available and reviewed as appropriate. Description: The posterior dominant rhythm consists of 8 Hz activity of moderate voltage (25-35 uV) seen predominantly in posterior head regions, symmetric and reactive to eye opening and eye closing. Sleep was characterized by vertex waves, sleep spindles (12 to 14 Hz), maximal frontocentral region. Hyperventilation and photic stimulation were  not performed.   IMPRESSION: This study is within normal limits. No seizures or epileptiform discharges were seen throughout the recording. A normal interictal EEG does not exclude the diagnosis of epilepsy. Lora Havens   DG Abdomen 1 View  Result Date: 11/30/2022 CLINICAL DATA:  Check gastric catheter placement EXAM: ABDOMEN - 1 VIEW COMPARISON:  None Available. FINDINGS: Gastric catheter extends into the stomach. Proximal side port is near the gastroesophageal junction. This could be advanced deeper into the stomach. Fullness of the left renal pelvis is noted without obstructive change.  IMPRESSION: Gastric catheter as described. This could be advanced deeper into the stomach. Electronically Signed   By: Inez Catalina M.D.   On: 11/30/2022 02:06   CT ABDOMEN PELVIS W CONTRAST  Result Date: 11/29/2022 CLINICAL DATA:  Mildly dilated small bowel on recent plain film examination. EXAM: CT ABDOMEN AND PELVIS WITH CONTRAST TECHNIQUE: Multidetector CT imaging of the abdomen and pelvis was performed using the standard protocol following bolus administration of intravenous contrast. RADIATION DOSE REDUCTION: This exam was performed according to the departmental dose-optimization program which includes automated exposure control, adjustment of the mA and/or kV according to patient size and/or use of iterative reconstruction technique. CONTRAST:  9m OMNIPAQUE IOHEXOL 350 MG/ML SOLN COMPARISON:  Plain film from earlier in the same day. CT from 09/10/2022 FINDINGS: Lower chest: No acute abnormality. Hepatobiliary: Liver again demonstrates multiple cysts. The largest of these bridges the medial and lateral segments of the left lobe of the liver. It measures up to 7.9 cm in greatest dimension. No focal mass is seen. The gallbladder is within normal limits. Pancreas: Unremarkable. No pancreatic ductal dilatation or surrounding inflammatory changes. Spleen: Normal in size without focal abnormality. Adrenals/Urinary Tract: Adrenal glands are stable in appearance. Normal enhancement of the kidneys is seen. Multiple left-sided renal calculi are identified measuring up to 14 mm in the upper pole. Punctate stones are noted on the right. Mild increased attenuation of the urine is noted related to recent gadolinium administration. No obstructive changes are seen. The bladder is partially distended. Stomach/Bowel: Scattered diverticular change of the colon is noted. Fluid is noted throughout the colon consistent with a diarrheal state. Postsurgical changes in the right abdomen are noted consistent with prior  ileocolectomy and small bowel to colon anastomosis. This appears patent. The distal ileum shows significant wall thickening, luminal narrowing and surrounding inflammatory changes consistent with the patient's given clinical history of Crohn's disease. Some fiber fatty deposition is noted as well. This appearance is similar to that seen on the most recent CT. Mildly dilated loops of small bowel are noted proximal to the distal ileum consistent with a partial small bowel obstruction. More proximal small bowel appears within normal limits. The stomach is decompressed. Vascular/Lymphatic: Aortic atherosclerosis. No enlarged abdominal or pelvic lymph nodes. Reproductive: Prostate is unremarkable. Other: No abdominal wall hernia or abnormality. No abdominopelvic ascites. Musculoskeletal: Left hip replacement is noted. No acute bony abnormality is seen. IMPRESSION: Postsurgical changes in the right abdomen consistent with prior distal ileum and proximal right colon removal with small bowel to colonic anastomosis. Just prior to this anastomosis there is a segment of inflamed small bowel identified with generalized narrowing, wall thickening and surrounding inflammatory change. These changes are stable from the prior exam and consistent with the given clinical history. The small bowel proximal to this diseased segment is dilated consistent with a partial small bowel obstruction. These changes are new from the prior exam. Bilateral renal calculi without complicating factors.  Stable hepatic cysts. Electronically Signed   By: Inez Catalina M.D.   On: 11/29/2022 23:43   MR BRAIN W WO CONTRAST  Result Date: 11/29/2022 CLINICAL DATA:  New onset seizure EXAM: MRI HEAD WITHOUT AND WITH CONTRAST TECHNIQUE: Multiplanar, multiecho pulse sequences of the brain and surrounding structures were obtained without and with intravenous contrast. CONTRAST:  15m GADAVIST GADOBUTROL 1 MMOL/ML IV SOLN COMPARISON:  No prior MRI available,  correlation is made with 11/29/2022 CT head FINDINGS: Evaluation is somewhat limited by motion artifact. Brain: No restricted diffusion to suggest acute or subacute infarct. No abnormal parenchymal or meningeal enhancement. No acute hemorrhage, mass, mass effect, or midline shift. No hydrocephalus or extra-axial collection.No hemosiderin deposition to suggest remote hemorrhage.Partial empty sella. Normal craniocervical junction. Scattered T2 hyperintense signal in the periventricular white matter, likely the sequela of mild chronic small vessel ischemic disease. 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 The hippocampi are symmetric in size and signal. No heterotopia or evidence of cortical dysgenesis. Vascular: Patent arterial flow voids. Normal arterial and venous enhancement. Skull and upper cervical spine: Normal marrow signal. Sinuses/Orbits: Clear paranasal sinuses.No acute finding in the orbits. Other: The mastoid air cells are well aerated. IMPRESSION: No acute intracranial process. No seizure etiology is seen. Electronically Signed   By: AMerilyn BabaM.D.   On: 11/29/2022 22:16   DG Abd 1 View  Result Date: 11/29/2022 CLINICAL DATA:  Abdominal pain and distention. EXAM: ABDOMEN - 1 VIEW COMPARISON:  08/28/2012, 09/10/2022. FINDINGS: There is a mildly distended loop of small bowel in the mid left abdomen measuring up to 3.5 cm. Stones are present in the left kidney measuring up to 1.1 cm. IMPRESSION: 1. Mildly distended loop of small bowel in the mid left abdomen measuring up to 3.5 cm, possible ileus versus obstruction. CT is recommended for further evaluation. 2. Left renal calculi. Electronically Signed   By: LBrett FairyM.D.   On: 11/29/2022 21:43   DG Chest Port 1 View  Result Date: 11/29/2022 CLINICAL DATA:  Sepsis, seizure EXAM: PORTABLE CHEST 1 VIEW COMPARISON:  07/31/2022 FINDINGS: Heavily reverse lordotic projection somewhat obscures the lung bases.  Low lung volumes are present. Suspicion for left greater than right airspace opacity in the lower lobes. Heart size within normal limits for projection. Expected glenohumeral alignment noted bilaterally on this single view. IMPRESSION: 1. Suspicion for left greater than right airspace opacity in the lower lobes, potentially from pneumonia, atelectasis, or aspiration pneumonitis. Assessment of the lung bases is adversely affected by the reverse lordotic projection which can exaggerate basilar findings. Repeat radiography may be warranted when feasible. 2. Low lung volumes. Electronically Signed   By: WVan ClinesM.D.   On: 11/29/2022 17:35   CT Head Wo Contrast  Result Date: 11/29/2022 CLINICAL DATA:  Seizure EXAM: CT HEAD WITHOUT CONTRAST TECHNIQUE: Contiguous axial images were obtained from the base of the skull through the vertex without intravenous contrast. RADIATION DOSE REDUCTION: This exam was performed according to the departmental dose-optimization program which includes automated exposure control, adjustment of the mA and/or kV according to patient size and/or use of iterative reconstruction technique. COMPARISON:  None Available. FINDINGS: Brain: Chronic appearing lacunar infarct within the right thalamus/posterior limb of the internal capsule. No evidence of acute infarct or hemorrhage. Lateral ventricles and remaining midline structures are unremarkable. No acute extra-axial fluid collections. No mass effect. Vascular: No hyperdense vessel or unexpected calcification. Skull: Normal. Negative for fracture or focal lesion. Sinuses/Orbits:  No acute finding. Other: None. IMPRESSION: 1. Chronic right thalamic lacunar infarct. No acute intracranial process. Electronically Signed   By: Randa Ngo M.D.   On: 11/29/2022 17:14    Pending Labs Unresulted Labs (From admission, onward)     Start     Ordered   12/01/22 0500  Comprehensive metabolic panel  Tomorrow morning,   R        11/30/22  0836   12/01/22 0500  CBC  Tomorrow morning,   R        11/30/22 0836   12/01/22 0500  Vitamin B12  (Anemia Panel (PNL))  Tomorrow morning,   R        11/30/22 0836   12/01/22 0500  Folate  (Anemia Panel (PNL))  Tomorrow morning,   R        11/30/22 0836   12/01/22 0500  Iron and TIBC  (Anemia Panel (PNL))  Tomorrow morning,   R        11/30/22 0836   12/01/22 0500  Ferritin  (Anemia Panel (PNL))  Tomorrow morning,   R        11/30/22 0836   12/01/22 0500  Reticulocytes  (Anemia Panel (PNL))  Tomorrow morning,   R        11/30/22 0836   12/01/22 0500  Magnesium  Tomorrow morning,   R        11/30/22 1333            Vitals/Pain Today's Vitals   11/30/22 1100 11/30/22 1730 11/30/22 1736 11/30/22 1745  BP: (!) 145/83 (!) 145/89 (!) 145/89 (!) 146/82  Pulse: 76 95 90 87  Resp: '14 19 17 '$ (!) 21  Temp:   97.9 F (36.6 C)   TempSrc:   Oral   SpO2: 100% 100% 100% 100%  PainSc:   4      Isolation Precautions No active isolations  Medications Medications  acetaminophen (TYLENOL) tablet 650 mg (has no administration in time range)  ondansetron (ZOFRAN) tablet 4 mg (has no administration in time range)    Or  ondansetron (ZOFRAN) injection 4 mg (has no administration in time range)  insulin aspart (novoLOG) injection 0-9 Units ( Subcutaneous Not Given 11/30/22 1733)  dextrose 5 % and 0.9 % NaCl with KCl 20 mEq/L infusion ( Intravenous New Bag/Given 11/30/22 1021)  levETIRAcetam (KEPPRA) 750 mg in sodium chloride 0.9 % 100 mL IVPB (has no administration in time range)    Or  levETIRAcetam (KEPPRA) tablet 750 mg (has no administration in time range)  midazolam (VERSED) injection 2 mg (has no administration in time range)  enoxaparin (LOVENOX) injection 40 mg (40 mg Subcutaneous Given 11/30/22 1734)  mesalamine (LIALDA) EC tablet 2.4 g (2.4 g Oral Given 11/30/22 1721)  tamsulosin (FLOMAX) capsule 0.4 mg (has no administration in time range)  Trospium Chloride CP24 60 mg (has no  administration in time range)  LORazepam (ATIVAN) 2 MG/ML injection (2 mg  Given 11/29/22 1730)  levETIRAcetam (KEPPRA) 1000 MG/100ML IVPB (0 mg  Stopped 11/29/22 1715)  levETIRAcetam (KEPPRA) 2,500 mg in sodium chloride 0.9 % 250 mL IVPB (0 mg Intravenous Stopped 11/29/22 2126)  gadobutrol (GADAVIST) 1 MMOL/ML injection 7 mL (7 mLs Intravenous Contrast Given 11/29/22 2027)  iohexol (OMNIPAQUE) 350 MG/ML injection 75 mL (75 mLs Intravenous Contrast Given 11/29/22 2303)  potassium chloride 10 mEq in 100 mL IVPB (0 mEq Intravenous Stopped 11/30/22 1522)  magnesium sulfate IVPB 4 g 100 mL (0 g Intravenous Stopped 11/30/22  1645)    Mobility   Pt normally walk, but he is weak  Focused Assessments Neuro Assessment Handoff:  Swallow screen pass?  NO Cardiac Rhythm: Normal sinus rhythm   Last date known well: 11/29/22   Neuro Assessment: Exceptions to WDL Neuro Checks:      Has TPA been given? No If patient is a Neuro Trauma and patient is going to OR before floor call report to [place] nurse: [phone] or [phone]   R Recommendations: See Admitting Provider Note  Report given to:   Additional Notes: He was brought in from home when he has had a witness seizure. Wife help patient to the ground. HE bit his tongue. He suppose to get neuro checks q 4 hours. He is on a clear liquid diet. Wound saw him and put a Small Eakin's on RLQ. You can read WOC note. He is on Overnight EEG with video.

## 2022-11-30 NOTE — ED Notes (Signed)
NG tube was removed at 1400

## 2022-11-30 NOTE — Plan of Care (Signed)

## 2022-11-30 NOTE — ED Notes (Signed)
.  ed

## 2022-11-30 NOTE — ED Notes (Signed)
General surgeon at bedside evaluating patient

## 2022-12-01 ENCOUNTER — Other Ambulatory Visit (HOSPITAL_COMMUNITY): Payer: Self-pay

## 2022-12-01 DIAGNOSIS — Z8673 Personal history of transient ischemic attack (TIA), and cerebral infarction without residual deficits: Secondary | ICD-10-CM | POA: Diagnosis not present

## 2022-12-01 DIAGNOSIS — R569 Unspecified convulsions: Secondary | ICD-10-CM | POA: Diagnosis not present

## 2022-12-01 LAB — CBC
HCT: 35.3 % — ABNORMAL LOW (ref 39.0–52.0)
Hemoglobin: 11.6 g/dL — ABNORMAL LOW (ref 13.0–17.0)
MCH: 27 pg (ref 26.0–34.0)
MCHC: 32.9 g/dL (ref 30.0–36.0)
MCV: 82.1 fL (ref 80.0–100.0)
Platelets: 179 10*3/uL (ref 150–400)
RBC: 4.3 MIL/uL (ref 4.22–5.81)
RDW: 15.8 % — ABNORMAL HIGH (ref 11.5–15.5)
WBC: 3.9 10*3/uL — ABNORMAL LOW (ref 4.0–10.5)
nRBC: 0 % (ref 0.0–0.2)

## 2022-12-01 LAB — RETICULOCYTES
Immature Retic Fract: 13.6 % (ref 2.3–15.9)
RBC.: 4.32 MIL/uL (ref 4.22–5.81)
Retic Count, Absolute: 44.9 10*3/uL (ref 19.0–186.0)
Retic Ct Pct: 1 % (ref 0.4–3.1)

## 2022-12-01 LAB — COMPREHENSIVE METABOLIC PANEL
ALT: 11 U/L (ref 0–44)
AST: 20 U/L (ref 15–41)
Albumin: 2.6 g/dL — ABNORMAL LOW (ref 3.5–5.0)
Alkaline Phosphatase: 44 U/L (ref 38–126)
Anion gap: 7 (ref 5–15)
BUN: <5 mg/dL — ABNORMAL LOW (ref 8–23)
CO2: 23 mmol/L (ref 22–32)
Calcium: 7.8 mg/dL — ABNORMAL LOW (ref 8.9–10.3)
Chloride: 110 mmol/L (ref 98–111)
Creatinine, Ser: 0.93 mg/dL (ref 0.61–1.24)
GFR, Estimated: >60 mL/min (ref >60)
Glucose, Bld: 126 mg/dL — ABNORMAL HIGH (ref 70–99)
Potassium: 2.9 mmol/L — ABNORMAL LOW (ref 3.5–5.1)
Sodium: 140 mmol/L (ref 135–145)
Total Bilirubin: 0.4 mg/dL (ref 0.3–1.2)
Total Protein: 5.4 g/dL — ABNORMAL LOW (ref 6.5–8.1)

## 2022-12-01 LAB — GLUCOSE, CAPILLARY
Glucose-Capillary: 94 mg/dL (ref 70–99)
Glucose-Capillary: 148 mg/dL — ABNORMAL HIGH (ref 70–99)
Glucose-Capillary: 93 mg/dL (ref 70–99)
Glucose-Capillary: 114 mg/dL — ABNORMAL HIGH (ref 70–99)
Glucose-Capillary: 122 mg/dL — ABNORMAL HIGH (ref 70–99)

## 2022-12-01 LAB — IRON AND TIBC
Iron: 35 ug/dL — ABNORMAL LOW (ref 45–182)
Saturation Ratios: 10 % — ABNORMAL LOW (ref 17.9–39.5)
TIBC: 346 ug/dL (ref 250–450)
UIBC: 311 ug/dL

## 2022-12-01 LAB — LIPID PANEL
Cholesterol: 162 mg/dL (ref 0–200)
HDL: 49 mg/dL (ref >40)
LDL Cholesterol: 85 mg/dL (ref 0–99)
Total CHOL/HDL Ratio: 3.3 RATIO
Triglycerides: 141 mg/dL (ref <150)
VLDL: 28 mg/dL (ref 0–40)

## 2022-12-01 LAB — MAGNESIUM: Magnesium: 2 mg/dL (ref 1.7–2.4)

## 2022-12-01 LAB — FOLATE: Folate: 15.5 ng/mL (ref >5.9)

## 2022-12-01 LAB — FERRITIN: Ferritin: 13 ng/mL — ABNORMAL LOW (ref 24–336)

## 2022-12-01 LAB — VITAMIN B12: Vitamin B-12: 520 pg/mL (ref 180–914)

## 2022-12-01 MED ORDER — VALTOCO 10 MG DOSE 10 MG/0.1ML NA LIQD
10.0000 mg | NASAL | 0 refills | Status: DC | PRN
  Filled 2022-12-01: qty 2, 15d supply, fill #0

## 2022-12-01 MED ORDER — LEVETIRACETAM 750 MG PO TABS
750.0000 mg | ORAL_TABLET | Freq: Two times a day (BID) | ORAL | 2 refills | Status: DC
  Filled 2022-12-01: qty 60, 30d supply, fill #0

## 2022-12-01 MED ORDER — INSULIN ASPART 100 UNIT/ML IJ SOLN
0.0000 [IU] | Freq: Three times a day (TID) | INTRAMUSCULAR | Status: DC
  Administered 2022-12-02: 1 [IU] via SUBCUTANEOUS
  Administered 2022-12-02: 2 [IU] via SUBCUTANEOUS

## 2022-12-01 MED ORDER — POTASSIUM CHLORIDE 10 MEQ/100ML IV SOLN
10.0000 meq | INTRAVENOUS | Status: AC
  Administered 2022-12-01 (×3): 10 meq via INTRAVENOUS
  Filled 2022-12-01 (×3): qty 100

## 2022-12-01 MED ORDER — ASPIRIN 81 MG PO CHEW
81.0000 mg | CHEWABLE_TABLET | Freq: Every day | ORAL | Status: DC
  Administered 2022-12-01 – 2022-12-02 (×2): 81 mg via ORAL
  Filled 2022-12-01 (×2): qty 1

## 2022-12-01 MED ORDER — ATORVASTATIN CALCIUM 40 MG PO TABS
40.0000 mg | ORAL_TABLET | Freq: Every day | ORAL | 2 refills | Status: DC
  Filled 2022-12-01: qty 30, 30d supply, fill #0

## 2022-12-01 MED ORDER — POTASSIUM CHLORIDE CRYS ER 20 MEQ PO TBCR
40.0000 meq | EXTENDED_RELEASE_TABLET | Freq: Once | ORAL | Status: AC
  Administered 2022-12-01: 40 meq via ORAL
  Filled 2022-12-01: qty 2

## 2022-12-01 MED ORDER — POTASSIUM CHLORIDE CRYS ER 20 MEQ PO TBCR
40.0000 meq | EXTENDED_RELEASE_TABLET | Freq: Four times a day (QID) | ORAL | Status: DC
  Administered 2022-12-01 – 2022-12-02 (×5): 40 meq via ORAL
  Filled 2022-12-01 (×5): qty 2

## 2022-12-01 MED ORDER — ASPIRIN 81 MG PO CHEW: 81.0000 mg | CHEWABLE_TABLET | Freq: Every day | ORAL | Status: DC

## 2022-12-01 MED ORDER — ATORVASTATIN CALCIUM 40 MG PO TABS
40.0000 mg | ORAL_TABLET | Freq: Every day | ORAL | Status: DC
  Administered 2022-12-01 – 2022-12-02 (×2): 40 mg via ORAL
  Filled 2022-12-01 (×2): qty 1

## 2022-12-01 MED ORDER — DIAZEPAM (20 MG DOSE) 2 X 10 MG/0.1ML NA LQPK
10.0000 mg | NASAL | Status: DC | PRN
  Filled 2022-12-01: qty 5

## 2022-12-01 NOTE — Plan of Care (Signed)
  Problem: Coping: Goal: Ability to adjust to condition or change in health will improve Outcome: Progressing   Problem: Metabolic: Goal: Ability to maintain appropriate glucose levels will improve Outcome: Progressing

## 2022-12-01 NOTE — TOC Initial Note (Signed)
Transition of Care [facility]) - Initial/Assessment Note    Patient Details  Name: [name] MRN: [id number] Date of Birth: [birth year]  Transition of Care [facility]) CM/SW Contact:    Levonne Lapping, RN Phone Number: 12/01/2022, 4:01 PM  Clinical Narrative:         Transition of Care [facility]) Screening Note   Patient Details  Name: [name] Date of Birth: May 31, 1956   Transition of Care [facility]) CM/SW Contact:    Levonne Lapping, RN Phone Number: 12/01/2022, 4:01 PM    Transition of Care Department [facility]) has reviewed patient and no TOC needs have been identified at this time. We will continue to monitor patient advancement through interdisciplinary progression rounds. If new patient transition needs arise, please place a TOC consult.               Expected Discharge Plan: Home/Self Care Barriers to Discharge: Continued Medical Work up   Patient Goals and CMS Choice Patient states their goals for this hospitalization and ongoing recovery are:: feel better and go home   Choice offered to / list presented to : NA      Expected Discharge Plan and Services In-house Referral: NA Discharge Planning Services: NA Post Acute Care Choice: NA                   DME Arranged: N/A         HH Arranged: NA [place] Agency: NA        Prior Living Arrangements/Services   Lives with:: Adult Children, Spouse (Wife and Special Needs Adult (68 y/o) Son) Patient language and need for interpreter reviewed:: Yes Do you feel safe going back to the place where you live?: Yes      Need for Family Participation in Patient Care: Yes (Comment) Care giver support system in place?: Yes (comment) (Wife) Current home services: DME ([employer]) Criminal Activity/Legal Involvement Pertinent to Current Situation/Hospitalization: No - Comment as needed  Activities of Daily Living      Permission Sought/Granted                  Emotional Assessment Appearance:: Appears stated  age Attitude/Demeanor/Rapport: Engaged Affect (typically observed): Appropriate Orientation: : Oriented to Self, Oriented to Place, Oriented to  Time, Oriented to Situation Alcohol / Substance Use: Not Applicable Psych Involvement: No (comment)  Admission diagnosis:  Seizure ([place]) [R56.9] New onset seizure ([place]) [R56.9] New onset seizure without head trauma ([place]) [R56.9] Patient Active Problem List   Diagnosis Date Noted   Seizure ([place]) 11/30/2022   New onset seizure without head trauma ([place]) 11/29/2022   Aspiration pneumonitis ([place]) 11/29/2022   AKI (acute kidney injury) ([place]) 11/29/2022   Lactic acidosis 11/29/2022   BPH (benign prostatic hyperplasia) 09/10/2022   Acute cystitis 09/10/2022   Crohn's ileitis, 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 [place] [address]Phone: [phone]: [phone] [facility] [address]Phone: [phone]:  [phone]     Social Determinants of Health (SDOH) Social History: SDOH Screenings   Food Insecurity: No Food Insecurity (09/10/2022)  Housing: Low Risk  (09/10/2022)  Transportation Needs: No Transportation Needs (09/10/2022)  Utilities: Not At Risk (09/10/2022)  Tobacco Use: Medium Risk (11/30/2022)   SDOH Interventions:     Readmission Risk Interventions    08/08/2022   10:54 AM  Readmission Risk Prevention Plan  Post Dischage Appt Complete  Medication Screening  Complete  Transportation Screening Complete

## 2022-12-01 NOTE — Progress Notes (Signed)
Subjective: No acute events overnight.  Denies any seizure-like episodes.  Denies any other concerns.  ROS: negative except above  Examination  Vital signs in last 24 hours: Temp:  [97.9 F (36.6 C)-98.8 F (37.1 C)] 98.4 F (36.9 C) (01/19 0800) Pulse Rate:  [75-95] 80 (01/19 0800) Resp:  [14-21] 16 (01/19 0800) BP: (135-157)/(81-95) 148/81 (01/19 0800) SpO2:  [96 %-100 %] 97 % (01/19 0800)  General: lying in bed, NAD Neuro: AOx3, no aphasia, cranial nerves II to XII grossly intact, 4/5 in bilateral upper extremities, 4/5 in lateral lower extremities, FTN intact bilaterally, sensation intact to light touch.    Basic Metabolic Panel: Recent Labs  Lab 11/29/22 1720 11/30/22 0415 12/01/22 0532  NA 142 141 140  K 3.6 2.9* 2.9*  CL 107 110 110  CO2 21* 22 23  GLUCOSE 220* 97 126*  BUN 10 10 <5*  CREATININE 1.50* 0.99 0.93  CALCIUM 8.6* 7.9* 7.8*  MG  --  1.6* 2.0    CBC: Recent Labs  Lab 11/29/22 1720 11/30/22 0415 12/01/22 0532  WBC 9.3 6.3 3.9*  NEUTROABS 8.2*  --   --   HGB 12.0* 11.2* 11.6*  HCT 39.2 35.0* 35.3*  MCV 85.4 84.3 82.1  PLT 200 171 179     Coagulation Studies: Recent Labs    11/29/22 1720  LABPROT 13.7  INR 1.1    Imaging No new brain imaging overnight  ASSESSMENT AND PLAN: ure after vomiting.  The semiology of the seizure was described as left gaze deviation, flexion of left arm followed by generalized tonic-clonic seizure, associated with tongue bite.  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   New onset focal seizures Chronic right thalamic stroke -Etiology of seizure: No infection, no alcohol/drug use.  Does have  hypomagnesemia but not significant enough to provoke a seizure.    Recommendations -As seizure was focal in onset, would recommend continuing Keppra '750mg'$  twice daily. -As needed intranasal Valtoco 20 mg (10 mg in each nostril) for generalized tonic-clonic seizure lasting more than 5 minutes.  Per pharmacy,  co-pay is about $18 -Discontinue LTM EEG -Recommend aspirin 81 mg daily for secondary stroke prevention.  Also sent a message via secure chat to surgery attending Dr. Zenia Resides to make sure aspirin is okay to start given his history of Crohn's disease -Lipid panel ordered and pending.  If LDL more than 70, recommend starting atorvastatin 40 mg daily -Continue seizure precautions including do not drive for 6 months -Follow-up with neurology in 3 months  -Management of rest of comorbidities per primary team -Discussed plan with patient as well as Dr. Thereasa Solo  Seizure precautions: Per [facility] statutes, patients with seizures are not allowed to drive until they have been seizure-free for six months and cleared by a physician    Use caution when using heavy equipment or power tools. Avoid working on ladders or at heights. Take showers instead of baths. Ensure the water temperature is not too high on the home water heater. Do not go swimming alone. Do not lock yourself in a room alone (i.e. bathroom). When caring for infants or small children, sit down when holding, feeding, or changing them to minimize risk of injury to the child in the event you have a seizure. Maintain good sleep hygiene. Avoid alcohol.    If patient has another seizure, call 911 and bring them back to the ED if: A.  The seizure lasts longer than 5 minutes.  B.  The patient doesn't wake shortly after the seizure or has new problems such as difficulty seeing, speaking or moving following the seizure C.  The patient was injured during the seizure D.  The patient has a temperature over 102 F (39C) E.  The patient vomited during the seizure and now is having trouble breathing    During the Seizure   - First, ensure adequate ventilation and place patients on the floor on their left side  Loosen clothing around the neck and ensure the airway is patent. If the patient is clenching the teeth, do not force the mouth open with  any object as this can cause severe damage - Remove all items from the surrounding that can be hazardous. The patient may be oblivious to what's happening and may not even know what he or she is doing. If the patient is confused and wandering, either gently guide him/her away and block access to outside areas - Reassure the individual and be comforting - Call 911. In most cases, the seizure ends before EMS arrives. However, there are cases when seizures may last over 3 to 5 minutes. Or the individual may have developed breathing difficulties or severe injuries. If a pregnant patient or a person with diabetes develops a seizure, it is prudent to call an ambulance. - Finally, if the patient does not regain full consciousness, then call EMS. Most patients will remain confused for about 45 to 90 minutes after a seizure, so you must use judgment in calling for help.    After the Seizure (Postictal Stage)   After a seizure, most patients experience confusion, fatigue, muscle pain and/or a headache. Thus, one should permit the individual to sleep. For the next few days, reassurance is essential. Being calm and helping reorient the person is also of importance.   Most seizures are painless and end spontaneously. Seizures are not harmful to others but can lead to complications such as stress on the lungs, brain and the heart. Individuals with prior lung problems may develop labored breathing and respiratory distress.    I have spent a total of  37  minutes with the patient reviewing hospital notes,  test results, labs and examining the patient as well as establishing an assessment and plan that was discussed personally with the patient.  > 50% of time was spent in direct patient care.   Zeb Comfort Epilepsy [facility] For questions after 5pm please refer to AMION to reach the Neurologist on call

## 2022-12-01 NOTE — Procedures (Addendum)
Patient Name: [name]  MRN: [id number]  Epilepsy Attending: Lora Havens  Referring Physician/Provider: Derek Jack, MD  Duration: 12/01/2022 0211 to 12/01/2022 1009   Patient history: 67 year old male with new onset status epilepticus.  EEG to evaluate for seizure.   Level of alertness: Awake, asleep   AEDs during EEG study: LEV   Technical aspects: This EEG study was done with scalp electrodes positioned according to the 10-20 International system of electrode placement. Electrical activity was reviewed with band pass filter of 1-'70Hz'$ , sensitivity of 7 uV/mm, display speed of 7m/sec with a '60Hz'$  notched filter applied as appropriate. EEG data were recorded continuously and digitally stored.  Video monitoring was available and reviewed as appropriate.   Description: The posterior dominant rhythm consists of 8 Hz activity of moderate voltage (25-35 uV) seen predominantly in posterior head regions, symmetric and reactive to eye opening and eye closing. Sleep was characterized by vertex waves, sleep spindles (12 to 14 Hz), maximal frontocentral region. Hyperventilation and photic stimulation were not performed.      IMPRESSION: This study is within normal limits. No seizures or epileptiform discharges were seen throughout the recording.   A normal interictal EEG does not exclude the diagnosis of epilepsy.   Darl Brisbin OBarbra Sarks

## 2022-12-01 NOTE — Progress Notes (Signed)
Subjective/Chief Complaint: Reports so mild abdominal pain this morning   Objective: Vital signs in last 24 hours: Temp:  [97.9 F (36.6 C)-98.8 F (37.1 C)] 98.4 F (36.9 C) (01/19 0800) Pulse Rate:  [75-95] 80 (01/19 0800) Resp:  [16-21] 16 (01/19 0800) BP: (135-157)/(81-95) 148/81 (01/19 0800) SpO2:  [96 %-100 %] 97 % (01/19 0800) Last BM Date : 11/28/22  Intake/Output from previous day: 01/18 0701 - 01/19 0700 In: -  Out: 600 [Urine:600] Intake/Output this shift: Total I/O In: -  Out: 300 [Urine:300]  Exam: Awake and alert Following commands Abdomen mildly full, appliance covering right lower quad abdominal opening with nothing currently in the bag.  Mild right sided tenderness  Lab Results:  Recent Labs    11/30/22 0415 12/01/22 0532  WBC 6.3 3.9*  HGB 11.2* 11.6*  HCT 35.0* 35.3*  PLT 171 179   BMET Recent Labs    11/30/22 0415 12/01/22 0532  NA 141 140  K 2.9* 2.9*  CL 110 110  CO2 22 23  GLUCOSE 97 126*  BUN 10 <5*  CREATININE 0.99 0.93  CALCIUM 7.9* 7.8*   PT/INR Recent Labs    11/29/22 1720  LABPROT 13.7  INR 1.1   ABG No results for input(s): "PHART", "HCO3" in the last 72 hours.  Invalid input(s): "PCO2", "PO2"  Studies/Results: Overnight EEG with video  Result Date: 11/30/2022 Lora Havens, MD     12/01/2022  9:15 AM Patient Name: [name] MRN: [id number] Epilepsy Attending: Lora Havens Referring Physician/Provider: Derek Jack, MD Duration: 11/30/2022 0211 to 12/01/2022 0211 Patient history: 67 year old male with new onset status epilepticus.  EEG to evaluate for seizure. Level of alertness: Awake, asleep AEDs during EEG study: LEV Technical aspects: This EEG study was done with scalp electrodes positioned according to the 10-20 International system of electrode placement. Electrical activity was reviewed with band pass filter of 1-'70Hz'$ , sensitivity of 7 uV/mm, display speed of 53m/sec with a '60Hz'$  notched  filter applied as appropriate. EEG data were recorded continuously and digitally stored.  Video monitoring was available and reviewed as appropriate. Description: The posterior dominant rhythm consists of 8 Hz activity of moderate voltage (25-35 uV) seen predominantly in posterior head regions, symmetric and reactive to eye opening and eye closing. Sleep was characterized by vertex waves, sleep spindles (12 to 14 Hz), maximal frontocentral region. Hyperventilation and photic stimulation were not performed.   IMPRESSION: This study is within normal limits. No seizures or epileptiform discharges were seen throughout the recording. A normal interictal EEG does not exclude the diagnosis of epilepsy. PLora Havens  DG Abdomen 1 View  Result Date: 11/30/2022 CLINICAL DATA:  Check gastric catheter placement EXAM: ABDOMEN - 1 VIEW COMPARISON:  None Available. FINDINGS: Gastric catheter extends into the stomach. Proximal side port is near the gastroesophageal junction. This could be advanced deeper into the stomach. Fullness of the left renal pelvis is noted without obstructive change. IMPRESSION: Gastric catheter as described. This could be advanced deeper into the stomach. Electronically Signed   By: MInez CatalinaM.D.   On: 11/30/2022 02:06   CT ABDOMEN PELVIS W CONTRAST  Result Date: 11/29/2022 CLINICAL DATA:  Mildly dilated small bowel on recent plain film examination. EXAM: CT ABDOMEN AND PELVIS WITH CONTRAST TECHNIQUE: Multidetector CT imaging of the abdomen and pelvis was performed using the standard protocol following bolus administration of intravenous contrast. RADIATION DOSE REDUCTION: This exam was performed according to the departmental dose-optimization program  which includes automated exposure control, adjustment of the mA and/or kV according to patient size and/or use of iterative reconstruction technique. CONTRAST:  1m OMNIPAQUE IOHEXOL 350 MG/ML SOLN COMPARISON:  Plain film from earlier in the  same day. CT from 09/10/2022 FINDINGS: Lower chest: No acute abnormality. Hepatobiliary: Liver again demonstrates multiple cysts. The largest of these bridges the medial and lateral segments of the left lobe of the liver. It measures up to 7.9 cm in greatest dimension. No focal mass is seen. The gallbladder is within normal limits. Pancreas: Unremarkable. No pancreatic ductal dilatation or surrounding inflammatory changes. Spleen: Normal in size without focal abnormality. Adrenals/Urinary Tract: Adrenal glands are stable in appearance. Normal enhancement of the kidneys is seen. Multiple left-sided renal calculi are identified measuring up to 14 mm in the upper pole. Punctate stones are noted on the right. Mild increased attenuation of the urine is noted related to recent gadolinium administration. No obstructive changes are seen. The bladder is partially distended. Stomach/Bowel: Scattered diverticular change of the colon is noted. Fluid is noted throughout the colon consistent with a diarrheal state. Postsurgical changes in the right abdomen are noted consistent with prior ileocolectomy and small bowel to colon anastomosis. This appears patent. The distal ileum shows significant wall thickening, luminal narrowing and surrounding inflammatory changes consistent with the patient's given clinical history of Crohn's disease. Some fiber fatty deposition is noted as well. This appearance is similar to that seen on the most recent CT. Mildly dilated loops of small bowel are noted proximal to the distal ileum consistent with a partial small bowel obstruction. More proximal small bowel appears within normal limits. The stomach is decompressed. Vascular/Lymphatic: Aortic atherosclerosis. No enlarged abdominal or pelvic lymph nodes. Reproductive: Prostate is unremarkable. Other: No abdominal wall hernia or abnormality. No abdominopelvic ascites. Musculoskeletal: Left hip replacement is noted. No acute bony abnormality is  seen. IMPRESSION: Postsurgical changes in the right abdomen consistent with prior distal ileum and proximal right colon removal with small bowel to colonic anastomosis. Just prior to this anastomosis there is a segment of inflamed small bowel identified with generalized narrowing, wall thickening and surrounding inflammatory change. These changes are stable from the prior exam and consistent with the given clinical history. The small bowel proximal to this diseased segment is dilated consistent with a partial small bowel obstruction. These changes are new from the prior exam. Bilateral renal calculi without complicating factors. Stable hepatic cysts. Electronically Signed   By: MInez CatalinaM.D.   On: 11/29/2022 23:43   MR BRAIN W WO CONTRAST  Result Date: 11/29/2022 CLINICAL DATA:  New onset seizure EXAM: MRI HEAD WITHOUT AND WITH CONTRAST TECHNIQUE: Multiplanar, multiecho pulse sequences of the brain and surrounding structures were obtained without and with intravenous contrast. CONTRAST:  791mGADAVIST GADOBUTROL 1 MMOL/ML IV SOLN COMPARISON:  No prior MRI available, correlation is made with 11/29/2022 CT head FINDINGS: Evaluation is somewhat limited by motion artifact. Brain: No restricted diffusion to suggest acute or subacute infarct. No abnormal parenchymal or meningeal enhancement. No acute hemorrhage, mass, mass effect, or midline shift. No hydrocephalus or extra-axial collection.No hemosiderin deposition to suggest remote hemorrhage.Partial empty sella. Normal craniocervical junction. Scattered T2 hyperintense signal in the periventricular white matter, likely the sequela of mild chronic small vessel ischemic disease. 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 The hippocampi are symmetric in size and signal. No heterotopia or evidence of cortical dysgenesis. Vascular: Patent arterial flow voids. Normal arterial and venous enhancement. Skull  and upper cervical spine:  Normal marrow signal. Sinuses/Orbits: Clear paranasal sinuses.No acute finding in the orbits. Other: The mastoid air cells are well aerated. IMPRESSION: No acute intracranial process. No seizure etiology is seen. Electronically Signed   By: Merilyn Baba M.D.   On: 11/29/2022 22:16   DG Abd 1 View  Result Date: 11/29/2022 CLINICAL DATA:  Abdominal pain and distention. EXAM: ABDOMEN - 1 VIEW COMPARISON:  08/28/2012, 09/10/2022. FINDINGS: There is a mildly distended loop of small bowel in the mid left abdomen measuring up to 3.5 cm. Stones are present in the left kidney measuring up to 1.1 cm. IMPRESSION: 1. Mildly distended loop of small bowel in the mid left abdomen measuring up to 3.5 cm, possible ileus versus obstruction. CT is recommended for further evaluation. 2. Left renal calculi. Electronically Signed   By: Brett Fairy M.D.   On: 11/29/2022 21:43   DG Chest Port 1 View  Result Date: 11/29/2022 CLINICAL DATA:  Sepsis, seizure EXAM: PORTABLE CHEST 1 VIEW COMPARISON:  07/31/2022 FINDINGS: Heavily reverse lordotic projection somewhat obscures the lung bases. Low lung volumes are present. Suspicion for left greater than right airspace opacity in the lower lobes. Heart size within normal limits for projection. Expected glenohumeral alignment noted bilaterally on this single view. IMPRESSION: 1. Suspicion for left greater than right airspace opacity in the lower lobes, potentially from pneumonia, atelectasis, or aspiration pneumonitis. Assessment of the lung bases is adversely affected by the reverse lordotic projection which can exaggerate basilar findings. Repeat radiography may be warranted when feasible. 2. Low lung volumes. Electronically Signed   By: Van Clines M.D.   On: 11/29/2022 17:35   CT Head Wo Contrast  Result Date: 11/29/2022 CLINICAL DATA:  Seizure EXAM: CT HEAD WITHOUT CONTRAST TECHNIQUE: Contiguous axial images were obtained from the base of the skull through the vertex  without intravenous contrast. RADIATION DOSE REDUCTION: This exam was performed according to the departmental dose-optimization program which includes automated exposure control, adjustment of the mA and/or kV according to patient size and/or use of iterative reconstruction technique. COMPARISON:  None Available. FINDINGS: Brain: Chronic appearing lacunar infarct within the right thalamus/posterior limb of the internal capsule. No evidence of acute infarct or hemorrhage. Lateral ventricles and remaining midline structures are unremarkable. No acute extra-axial fluid collections. No mass effect. Vascular: No hyperdense vessel or unexpected calcification. Skull: Normal. Negative for fracture or focal lesion. Sinuses/Orbits: No acute finding. Other: None. IMPRESSION: 1. Chronic right thalamic lacunar infarct. No acute intracranial process. Electronically Signed   By: Randa Ngo M.D.   On: 11/29/2022 17:14    Anti-infectives: Anti-infectives (From admission, onward)    Start     Dose/Rate Route Frequency Ordered Stop   11/29/22 2130  ampicillin-sulbactam (UNASYN) 1.5 g in sodium chloride 0.9 % 100 mL IVPB  Status:  Discontinued        1.5 g 200 mL/hr over 30 Minutes Intravenous Every 6 hours 11/29/22 2117 11/30/22 0833   11/29/22 2115  ampicillin-sulbactam (UNASYN) 1.5 g in sodium chloride 0.9 % 100 mL IVPB  Status:  Discontinued        1.5 g 200 mL/hr over 30 Minutes Intravenous Every 6 hours 11/29/22 2111 11/29/22 2117       Assessment/Plan: Long-standing Crohn's disease with complex surgical history, hostile abdomen with likely Crohn's flair and partial SBO, 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  GI to see to help with medical management. Ok to continue diet unless he develops worsening of abdominal pain, nausea, vomiting, etc  We will follow .  LOS: 2 days    Coralie Keens MD 12/01/2022

## 2022-12-01 NOTE — Progress Notes (Signed)
LTM EEG discontinued - no skin breakdown at unhook. [facility] notified 

## 2022-12-01 NOTE — TOC Benefit Eligibility Note (Signed)
Patient Teacher, English as a foreign language completed.    The patient is currently admitted and upon discharge could be taking Valtoco 20 mg Dose 2 x 10 mg/0.1 ml lqpk.  The current 30 day co-pay is $18.00.   The patient is insured through [place], [place] Patient Advocate Specialist [place] Patient Advocate Team Direct Number: [phone]  Fax: [phone]

## 2022-12-01 NOTE — Progress Notes (Signed)
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  He was at home speaking to his spouse when he suddenly became unresponsive.  She lowered him to the ground and then witnessed him exhibiting generalized convulsions.  He bit his tongue.  When EMS arrived he was still convulsing.  He was dosed with Versed.  He had repeat seizure activity upon arrival to the ER.  CT head noted a chronic right thalamic lacunar infarct without acute findings.  The patient was loaded with Keppra and Ativan and Neurology was consulted.    Consultants:  Neurology General Surgery  Goals of Care:  Code Status: Full Code   DVT prophylaxis: Subcu heparin  Interim Hx: NG removed yesterday afternoon.  He tolerated a soft diet for lunch today.  He has no new complaints at present.  He reports that his abdominal pain has mostly resolved.  He has not yet had a bowel movement.  He denies nausea or vomiting.  No headache or chest pain.  Assessment & Plan:  New onset seizure activity Care per Neurology - has been loaded with Keppra - MRI brain revealed no acute findings - to continue [place] long term - f/u w/ Neurology as outpt   Aspiration pneumonitis CXR at presentation concerning for probable aspiration event - dosed with empiric Unasyn initially but not continued -no signs to suggest a true infectious pneumonia and follow-up  Acute kidney injury Creatinine 1.5 at admission -has returned to normal with volume resuscitation  Hypokalemia Likely due to poor absorption and chronic GI loss -continue to supplement -appears to be a chronic issue with the patient on 4 times daily potassium replacement at home  Hypomagnesemia Likely due to poor absorption  and chronic GI loss -corrected with supplementation -recheck in a.m.  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 rate 16, SpO2 97 %.  Intake/Output Summary (Last 24 hours) at 12/01/2022 0945 Last data filed at 12/01/2022 0600 Gross per 24 hour  Intake --  Output 600 ml  Net -600 ml    There were no vitals filed for this visit.  Examination: General: No acute respiratory distress Lungs: Clear to auscultation bilaterally without wheezes or crackles Cardiovascular: Mildly tachycardic but regular without murmur or rub Abdomen: Soft, mildly distended diffusely, no rebound, tender to palpation in bilateral upper  quadrants, no appreciable mass, ostomy bag in place over his small draining wound which Extremities: No significant edema bilateral lower extremities  CBC: Recent Labs  Lab 11/29/22 1720 11/30/22 0415 12/01/22 0532  WBC 9.3 6.3 3.9*  NEUTROABS 8.2*  --   --   HGB 12.0* 11.2* 11.6*  HCT 39.2 35.0* 35.3*   MCV 85.4 84.3 82.1  PLT 200 171 751    Basic Metabolic Panel: Recent Labs  Lab 11/29/22 1720 11/30/22 0415 12/01/22 0532  NA 142 141 140  K 3.6 2.9* 2.9*  CL 107 110 110  CO2 21* 22 23  GLUCOSE 220* 97 126*  BUN 10 10 <5*  CREATININE 1.50* 0.99 0.93  CALCIUM 8.6* 7.9* 7.8*  MG  --  1.6* 2.0    GFR: CrCl cannot be calculated (Unknown ideal weight.).   Scheduled Meds:  enoxaparin (LOVENOX) injection  40 mg Subcutaneous Q24H   fesoterodine  4 mg Oral Daily   insulin aspart  0-9 Units Subcutaneous Q4H   levETIRAcetam  750 mg Oral BID   mesalamine  2.4 g Oral BID   tamsulosin  0.4 mg Oral q AM   Continuous Infusions:  dextrose 5 % and 0.9 % NaCl with KCl 20 mEq/L 75 mL/hr at 12/01/22 0725   levETIRAcetam       LOS: 2 days   [name], MD [facility] Office  [phone] Pager - Text Page per Shea Evans  If 7PM-7AM, please contact night-coverage per Amion 12/01/2022, 9:45 AM

## 2022-12-01 NOTE — TOC Transition Note (Signed)
Discharge medications (3) are being stored in the main pharmacy on the ground floor until patient is ready for discharge.   

## 2022-12-01 NOTE — Progress Notes (Signed)
Pt admitted from the ED. Pt is alert and stable. No c/o pain or discomfort. Skin assessment skin intact. Pt is on EEG monitor. Call bell in reach.

## 2022-12-02 DIAGNOSIS — R569 Unspecified convulsions: Secondary | ICD-10-CM | POA: Diagnosis not present

## 2022-12-02 LAB — BASIC METABOLIC PANEL
Anion gap: 8 (ref 5–15)
BUN: 5 mg/dL — ABNORMAL LOW (ref 8–23)
CO2: 17 mmol/L — ABNORMAL LOW (ref 22–32)
Calcium: 8.1 mg/dL — ABNORMAL LOW (ref 8.9–10.3)
Chloride: 113 mmol/L — ABNORMAL HIGH (ref 98–111)
Creatinine, Ser: 0.94 mg/dL (ref 0.61–1.24)
GFR, Estimated: >60 mL/min (ref >60)
Glucose, Bld: 130 mg/dL — ABNORMAL HIGH (ref 70–99)
Potassium: 4 mmol/L (ref 3.5–5.1)
Sodium: 138 mmol/L (ref 135–145)

## 2022-12-02 LAB — GLUCOSE, CAPILLARY
Glucose-Capillary: 172 mg/dL — ABNORMAL HIGH (ref 70–99)
Glucose-Capillary: 143 mg/dL — ABNORMAL HIGH (ref 70–99)

## 2022-12-02 LAB — MAGNESIUM: Magnesium: 1.8 mg/dL (ref 1.7–2.4)

## 2022-12-02 MED ORDER — VALTOCO 10 MG DOSE 10 MG/0.1ML NA LIQD: 10.0000 mg | NASAL | 0 refills | Status: DC | PRN

## 2022-12-02 MED ORDER — LEVETIRACETAM 750 MG PO TABS: 750.0000 mg | ORAL_TABLET | Freq: Two times a day (BID) | ORAL | 2 refills | Status: DC

## 2022-12-02 MED ORDER — ATORVASTATIN CALCIUM 40 MG PO TABS: 40.0000 mg | ORAL_TABLET | Freq: Every day | ORAL | 2 refills | Status: DC

## 2022-12-02 NOTE — Plan of Care (Signed)

## 2022-12-02 NOTE — Discharge Summary (Signed)
DISCHARGE SUMMARY  [name]  MR#: [id number]  DOB:June 19, 1956  Date of Admission: 11/29/2022 Date of Discharge: 12/02/2022  Attending Physician:Belmira Daley Hennie Duos, MD  Patient's PCP:Sun, Mikeal Hawthorne, MD  Consults:  Neurology  General Surgery   Disposition: D/C home   Follow-up Appts:  Follow-up Information     Eric Mariscal, MD Follow up.   Specialty: Internal Medicine Why: have your potassium and magnesium levels checked by your Primary Care doctor in 3-5 days Contact information: [place] [address] [phone]         [place] Follow up in 3 month(s).   Why: The clinic will call you to schedule a follow up appointment in 3 months. 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 He was at home speaking to his spouse when he suddenly became unresponsive. She lowered him to the ground and then witnessed him exhibiting generalized convulsions. He bit his tongue. When EMS arrived he was still convulsing. He was dosed with Versed. He had repeat seizure activity upon  arrival to the ER. CT head noted a chronic right thalamic lacunar infarct without acute findings. The patient was loaded with Keppra and Ativan and Neurology was consulted.   Hospital Course:   New onset seizure activity Care per Neurology - was loaded with Keppra - MRI brain revealed no acute findings - to continue [place] term - f/u w/ Neurology as outpt - has been counseled on seizure precautions (see d/c instructions) to include abstaining form driving for 6 months seizure free    Aspiration pneumonitis CXR at presentation concerning for probable aspiration event - dosed with empiric Unasyn initially but not continued - no signs to suggest a true infectious pneumonia in follow-up   Acute kidney injury Creatinine 1.5 at admission - crt has returned to normal with volume resuscitation   Hypokalemia Likely due to poor absorption and chronic GI loss - supplemented during admission -appears to be a chronic issue with the patient on 4 times daily potassium replacement at home - continue same at d/c - will need outpt f/u    Hypomagnesemia Likely due to poor absorption and chronic GI loss -corrected with supplementation -recheck as outpt - stable at time of d/c    Lactic acidosis Due to seizure activity -hydrated   Crohn's disease with complicated abdominal history - acute partial SBO New partial SBO suggested on CT abdomen obtained due to abdominal tenderness on admission exam - General Surgery evaluated - small bowel dilatation is felt to be mild, focal, and possibly chronic in nature -patient reports that his abdomen feels baseline - tolerating diet well at time of d/c and denies abdom  complaints   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 Due to above -General Surgery suggested wound care consult to pouch this wound -this has been accomplished and patient is tolerating it well   Normocytic anemia anemia panel consistent with iron deficiency which is likely due to indolent  low-grade loss in setting of inflammatory bowel disease - iron supplementation   DM2 CBG well controlled    Allergies as of 12/02/2022       Reactions   Nsaids Other (See Comments)   Crohn's disease, told to avoid NSAIDs        Medication List     STOP taking these medications    tiZANidine 2 MG tablet Commonly known as: ZANAFLEX       TAKE these medications    aspirin 81 MG chewable tablet Chew 1 tablet (81 mg total) by mouth daily.   atorvastatin 40 MG tablet Commonly known as: LIPITOR Take 1 tablet (40 mg total) by mouth daily.   Humira (2 Pen) 40 MG/0.4ML Pnkt Generic drug: Adalimumab Inject 40 mg into the skin every 14 (fourteen) days.   levETIRAcetam 750 MG tablet Commonly known as: KEPPRA Take 1 tablet (750 mg total) by mouth 2 (two) times daily.   mesalamine 1.2 g EC tablet Commonly known as: LIALDA Take 2.4 g by mouth 2 (two) times daily.   metFORMIN 500 MG tablet Commonly known as: GLUCOPHAGE Take 500 mg by mouth 2 (two) times daily.   potassium citrate 10 MEQ (1080 MG) SR tablet Commonly known as: UROCIT-K Take 40 mEq by mouth 4 (four) times daily.   tadalafil 20 MG tablet Commonly known as: CIALIS Take 20 mg by mouth daily as needed for erectile dysfunction.   tamsulosin 0.4 MG Caps capsule Commonly known as: FLOMAX Take 1 capsule (0.4 mg total) by mouth daily after supper. What changed: when to take this   Trospium Chloride 60 MG Cp24 Take 60 mg by mouth in the morning.   Valtoco 10 MG Dose 10 MG/0.1ML Liqd Generic drug: diazePAM Place 10 mg into the nose as needed (seizure lasting greater than 5 minutes).   VITAMIN B-12 PO Take 1 tablet by mouth in the morning.   VITAMIN D-3 PO Take 1 capsule by mouth in the morning.        Day of Discharge BP (!) 152/89 (BP Location: Right Arm)   Pulse 81   Temp 97.9 F (36.6 C) (Oral)   Resp 18   Wt 77.6 kg   SpO2 98%   BMI 27.61 kg/m   Physical Exam: General: No acute  respiratory distress Lungs: Clear to auscultation bilaterally without wheezes or crackles Cardiovascular: Regular rate and rhythm without murmur gallop or rub normal S1 and S2 Abdomen: Nontender, nondistended, soft, bowel sounds positive, no rebound, no ascites, no appreciable mass Extremities: No significant cyanosis, clubbing, or edema bilateral lower extremities  Basic Metabolic Panel: Recent Labs  Lab 11/29/22 1720 11/30/22 0415 12/01/22 0532 12/02/22 0424  NA 142 141 140 138  K 3.6 2.9* 2.9* 4.0  CL 107 110 110 113*  CO2 21* 22 23 17*  GLUCOSE 220* 97 126* 130*  BUN 10 10 <5* 5*  CREATININE 1.50* 0.99 0.93 0.94  CALCIUM 8.6* 7.9* 7.8* 8.1*  MG  --  1.6* 2.0 1.8   CBC: Recent Labs  Lab 11/29/22 1720 11/30/22 0415 12/01/22 0532  WBC 9.3 6.3 3.9*  NEUTROABS 8.2*  --   --   HGB 12.0* 11.2* 11.6*  HCT 39.2  35.0* 35.3*  MCV 85.4 84.3 82.1  PLT 200 171 179     Time spent in discharge (includes decision making & examination of pt): 35 minutes  12/02/2022, 11:05 AM   [name], MD [facility] Office  [phone]

## 2022-12-02 NOTE — Progress Notes (Signed)
    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 Hassell Done know and arrange follow up  Will follow.  Per medical team.  Hopefully home soon.        Armandina Gemma, MD [facility] [place] [facility] Office: [phone]        Chief Complaint: Seizure, history of Crohn's  Subjective: Patient in bed, sleeping.  Arouses easily.  Pleasant.  No complaints.  Objective: Vital signs in last 24 hours: Temp:  [97.8 F (36.6 C)-98.9 F (37.2 C)] 97.9 F (36.6 C) (01/20 0744) Pulse Rate:  [76-89] 81 (01/20 0744) Resp:  [14-24] 18 (01/20 0744) BP: (122-165)/(76-121) 152/89 (01/20 0744) SpO2:  [92 %-98 %] 98 % (01/20 0744) Weight:  [77.6 kg] 77.6 kg (01/20 0503) Last BM Date : 12/01/22  Intake/Output from previous day: 01/19 0701 - 01/20 0700 In: -  Out: 700 [Urine:700] Intake/Output this shift: No intake/output data recorded.  Physical Exam: HEENT - sclerae clear, mucous membranes moist Abdomen - soft without distension; Eakin's pouch RLQ with thin yellow tan output Ext - no edema, non-tender Neuro - alert & oriented, no focal deficits  Lab Results:  Recent Labs    11/30/22 0415 12/01/22 0532  WBC 6.3 3.9*  HGB 11.2* 11.6*  HCT 35.0* 35.3*  PLT 171 179   BMET Recent Labs    12/01/22 0532 12/02/22 0424  NA 140 138  K 2.9* 4.0  CL 110 113*  CO2 23 17*  GLUCOSE 126* 130*  BUN <5* 5*  CREATININE 0.93 0.94  CALCIUM 7.8* 8.1*   PT/INR Recent Labs    11/29/22 1720  LABPROT 13.7  INR 1.1   Comprehensive Metabolic Panel:    Component Value Date/Time   NA 138 12/02/2022 0424   NA 140 12/01/2022 0532   K 4.0 12/02/2022 0424   K 2.9 (L) 12/01/2022 0532   CL 113 (H) 12/02/2022 0424   CL 110 12/01/2022 0532   CO2 17 (L) 12/02/2022 0424   CO2 23 12/01/2022 0532   BUN 5 (L) 12/02/2022 0424   BUN <5 (L) 12/01/2022 0532   CREATININE 0.94 12/02/2022 0424    CREATININE 0.93 12/01/2022 0532   GLUCOSE 130 (H) 12/02/2022 0424   GLUCOSE 126 (H) 12/01/2022 0532   CALCIUM 8.1 (L) 12/02/2022 0424   CALCIUM 7.8 (L) 12/01/2022 0532   AST 20 12/01/2022 0532   AST 17 11/30/2022 0415   ALT 11 12/01/2022 0532   ALT 11 11/30/2022 0415   ALKPHOS 44 12/01/2022 0532   ALKPHOS 44 11/30/2022 0415   BILITOT 0.4 12/01/2022 0532   BILITOT 0.4 11/30/2022 0415   PROT 5.4 (L) 12/01/2022 0532   PROT 5.4 (L) 11/30/2022 0415   ALBUMIN 2.6 (L) 12/01/2022 0532   ALBUMIN 2.7 (L) 11/30/2022 0415    Studies/Results: No results found.    Armandina Gemma 12/02/2022  Patient ID: [name], male   DOB: May 30, 1956, 67 y.o.   MRN: [id number]

## 2022-12-02 NOTE — Progress Notes (Signed)
Patient and wife advised that patient did not bring any home meds to hospital with them.

## 2022-12-02 NOTE — Progress Notes (Signed)
   12/02/22   To Whom it may concern,  [name] was hospitalized at [facility] from 11/29/2022 through 12/02/2022.  He has been cleared to return to work on but not before Sunday night (12/03/2022) at midnight, with no medical restrictions.    Should you have further questions please feel free to contact our office.  Be advised that no medical information will be divulged without a signed HIPA release from the patient.    Sincerely,  [name], MD [facility] Office  [phone]

## 2022-12-02 NOTE — Discharge Instructions (Signed)
Seizure precautions: Per [name] DMV statutes, patients with seizures are not allowed to drive until they have been seizure-free for six months and cleared by a physician    Use caution when using heavy equipment or power tools. Avoid working on ladders or at heights. Take showers instead of baths. Ensure the water temperature is not too high on the home water heater. Do not go swimming alone. Do not lock yourself in a room alone (i.e. bathroom). When caring for infants or small children, sit down when holding, feeding, or changing them to minimize risk of injury to the child in the event you have a seizure. Maintain good sleep hygiene. Avoid alcohol.    If patient has another seizure, call 911 and bring them back to the ED if: A.  The seizure lasts longer than 5 minutes.      B.  The patient doesn't wake shortly after the seizure or has new problems such as difficulty seeing, speaking or moving following the seizure C.  The patient was injured during the seizure D.  The patient has a temperature over 102 F (39C) E.  The patient vomited during the seizure and now is having trouble breathing    During the Seizure   - First, ensure adequate ventilation and place patients on the floor on their left side  Loosen clothing around the neck and ensure the airway is patent. If the patient is clenching the teeth, do not force the mouth open with any object as this can cause severe damage - Remove all items from the surrounding that can be hazardous. The patient may be oblivious to what's happening and may not even know what he or she is doing. If the patient is confused and wandering, either gently guide him/her away and block access to outside areas - Reassure the individual and be comforting - Call 911. In most cases, the seizure ends before EMS arrives. However, there are cases when seizures may last over 3 to 5 minutes. Or the individual may have developed breathing difficulties or severe  injuries. If a pregnant patient or a person with diabetes develops a seizure, it is prudent to call an ambulance. - Finally, if the patient does not regain full consciousness, then call EMS. Most patients will remain confused for about 45 to 90 minutes after a seizure, so you must use judgment in calling for help.      After the Seizure (Postictal Stage)   After a seizure, most patients experience confusion, fatigue, muscle pain and/or a headache. Thus, one should permit the individual to sleep. For the next few days, reassurance is essential. Being calm and helping reorient the person is also of importance.   Most seizures are painless and end spontaneously. Seizures are not harmful to others but can lead to complications such as stress on the lungs, brain and the heart. Individuals with prior lung problems may develop labored breathing and respiratory distress.  

## 2022-12-04 ENCOUNTER — Other Ambulatory Visit (HOSPITAL_COMMUNITY): Payer: Self-pay

## 2022-12-04 LAB — CULTURE, BLOOD (ROUTINE X 2)
Culture: NO GROWTH
Culture: NO GROWTH
Special Requests: ADEQUATE

## 2022-12-19 ENCOUNTER — Emergency Department (HOSPITAL_COMMUNITY): Payer: 59

## 2022-12-19 ENCOUNTER — Emergency Department (HOSPITAL_COMMUNITY)
Admission: EM | Admit: 2022-12-19 | Discharge: 2022-12-20 | Disposition: A | Discharge status: Home / Self Care | Payer: 59 | Attending: Emergency Medicine | Admitting: Emergency Medicine

## 2022-12-19 DIAGNOSIS — R29810 Facial weakness: Secondary | ICD-10-CM | POA: Diagnosis not present

## 2022-12-19 DIAGNOSIS — R519 Headache, unspecified: Secondary | ICD-10-CM | POA: Diagnosis not present

## 2022-12-19 DIAGNOSIS — K5090 Crohn's disease, unspecified, without complications: Secondary | ICD-10-CM | POA: Insufficient documentation

## 2022-12-19 DIAGNOSIS — Z7984 Long term (current) use of oral hypoglycemic drugs: Secondary | ICD-10-CM | POA: Diagnosis not present

## 2022-12-19 DIAGNOSIS — R569 Unspecified convulsions: Secondary | ICD-10-CM | POA: Diagnosis not present

## 2022-12-19 DIAGNOSIS — Z7982 Long term (current) use of aspirin: Secondary | ICD-10-CM | POA: Insufficient documentation

## 2022-12-19 LAB — CBC WITH DIFFERENTIAL/PLATELET
Abs Immature Granulocytes: 0.04 10*3/uL (ref 0.00–0.07)
Basophils Absolute: 0 10*3/uL (ref 0.0–0.1)
Basophils Relative: 0 %
Eosinophils Absolute: 0 10*3/uL (ref 0.0–0.5)
Eosinophils Relative: 1 %
HCT: 36.2 % — ABNORMAL LOW (ref 39.0–52.0)
Hemoglobin: 11.4 g/dL — ABNORMAL LOW (ref 13.0–17.0)
Immature Granulocytes: 1 %
Lymphocytes Relative: 19 %
Lymphs Abs: 1.4 10*3/uL (ref 0.7–4.0)
MCH: 26.5 pg (ref 26.0–34.0)
MCHC: 31.5 g/dL (ref 30.0–36.0)
MCV: 84 fL (ref 80.0–100.0)
Monocytes Absolute: 0.6 10*3/uL (ref 0.1–1.0)
Monocytes Relative: 8 %
Neutro Abs: 5.1 10*3/uL (ref 1.7–7.7)
Neutrophils Relative %: 71 %
Platelets: 143 10*3/uL — ABNORMAL LOW (ref 150–400)
RBC: 4.31 MIL/uL (ref 4.22–5.81)
RDW: 15.9 % — ABNORMAL HIGH (ref 11.5–15.5)
WBC: 7.1 10*3/uL (ref 4.0–10.5)
nRBC: 0 % (ref 0.0–0.2)

## 2022-12-19 LAB — RAPID URINE DRUG SCREEN, HOSP PERFORMED
Amphetamines: NOT DETECTED
Barbiturates: NOT DETECTED
Benzodiazepines: NOT DETECTED
Cocaine: NOT DETECTED
Opiates: NOT DETECTED
Tetrahydrocannabinol: NOT DETECTED

## 2022-12-19 LAB — COMPREHENSIVE METABOLIC PANEL
ALT: 11 U/L (ref 0–44)
AST: 17 U/L (ref 15–41)
Albumin: 3.1 g/dL — ABNORMAL LOW (ref 3.5–5.0)
Alkaline Phosphatase: 41 U/L (ref 38–126)
Anion gap: 11 (ref 5–15)
BUN: 18 mg/dL (ref 8–23)
CO2: 21 mmol/L — ABNORMAL LOW (ref 22–32)
Calcium: 8.8 mg/dL — ABNORMAL LOW (ref 8.9–10.3)
Chloride: 109 mmol/L (ref 98–111)
Creatinine, Ser: 0.97 mg/dL (ref 0.61–1.24)
GFR, Estimated: >60 mL/min (ref >60)
Glucose, Bld: 146 mg/dL — ABNORMAL HIGH (ref 70–99)
Potassium: 2.8 mmol/L — ABNORMAL LOW (ref 3.5–5.1)
Sodium: 141 mmol/L (ref 135–145)
Total Bilirubin: 0.4 mg/dL (ref 0.3–1.2)
Total Protein: 5.4 g/dL — ABNORMAL LOW (ref 6.5–8.1)

## 2022-12-19 LAB — ETHANOL: Alcohol, Ethyl (B): <10 mg/dL (ref <10)

## 2022-12-19 LAB — CBG MONITORING, ED: Glucose-Capillary: 148 mg/dL — ABNORMAL HIGH (ref 70–99)

## 2022-12-19 MED ORDER — LEVETIRACETAM IN NACL 1500 MG/100ML IV SOLN
3000.0000 mg | Freq: Once | INTRAVENOUS | Status: AC
  Administered 2022-12-19: 3000 mg via INTRAVENOUS
  Filled 2022-12-19: qty 200

## 2022-12-19 NOTE — ED Triage Notes (Signed)
Patient BIB EMS from home had seizure for 6 min wittiness by wife. Patient fell hit on floor has knot on forehead. Patient takes prn seizure medications. VSS. CBG 120.

## 2022-12-19 NOTE — ED Provider Triage Note (Addendum)
Emergency Medicine Provider Triage Evaluation Note  [name] , a 67 y.o. male  was evaluated in triage.  Pt complains of 6-minute seizure-like activity.  Patient is currently postictal and unable to contribute to history.  History of seizures, has not been taking Keppra in the outpatient setting per EMS report..  Patient fell and did hit his head.  History of seizures recently diagnosed a month ago.  Review of Systems  Per HPI  Physical Exam  BP (!) 155/87   Pulse (!) 109   Temp 98.2 F (36.8 C) (Oral)   Resp (!) 26   SpO2 99%  Gen:   Awake, no distress   Resp:  Normal effort  MSK:   Moves extremities without difficulty  Other:  Postictal.  Ostomy bag no abdominal tenderness  Medical Decision Making  Medically screening exam initiated at 6:13 PM.  Appropriate orders placed.  [name] was informed that the remainder of the evaluation will be completed by another provider, this initial triage assessment does not replace that evaluation, and the importance of remaining in the ED until their evaluation is complete.  I called Dr. Malen Gauze with neurology, they recommend 3 g Keppra, observation for the next hours to make sure he is on status and reconsult if still confused and slowly neurology admission.   Sherrill Raring, PA-C 12/19/22 1814    Sherrill Raring, PA-C 12/19/22 1819

## 2022-12-19 NOTE — ED Notes (Signed)
CMP collected

## 2022-12-20 ENCOUNTER — Encounter (HOSPITAL_COMMUNITY): Payer: Self-pay

## 2022-12-20 ENCOUNTER — Emergency Department (HOSPITAL_COMMUNITY): Payer: 59

## 2022-12-20 ENCOUNTER — Other Ambulatory Visit: Payer: Self-pay

## 2022-12-20 MED ORDER — POTASSIUM CHLORIDE CRYS ER 20 MEQ PO TBCR: 40.0000 meq | EXTENDED_RELEASE_TABLET | Freq: Every day | ORAL | 0 refills | Status: DC

## 2022-12-20 MED ORDER — POTASSIUM CHLORIDE 10 MEQ/100ML IV SOLN
10.0000 meq | INTRAVENOUS | Status: AC
  Administered 2022-12-20 (×3): 10 meq via INTRAVENOUS
  Filled 2022-12-20 (×3): qty 100

## 2022-12-20 NOTE — ED Provider Notes (Signed)
Patient signed out pending MRI and potassium supplementation.  MRI does not show any acute stroke.  Patient had 3 rounds of potassium for potassium of 2.8.  He states overall he feels better.  Initially when he got up to ambulate, nursing stated he appeared unsteady.  However, patient states that he feels like he just got up too quickly.  I ambulated the patient myself and he states he is at his baseline.  He does ambulate with a limp which he states is normal for him.  He feels comfortable going home.  Physical Exam  BP 134/82   Pulse 69   Temp 98.1 F (36.7 C)   Resp 15   SpO2 99%   Physical Exam Awake, alert, no acute distress Relates with a limp Procedures  Procedures  ED Course / MDM   Clinical Course as of 12/20/22 0624  Wed Dec 20, 2022  0621 Patient ambulated at bedside.  Patient had a limp but reports prior left hip surgery.  He states he is at his baseline.  His wife is coming to pick him up.  Discussed with him importance of compliance with his seizure medications. [CH]    Clinical Course User Index [CH] Kathlyn Leachman, Barbette Hair, MD   Medical Decision Making Amount and/or Complexity of Data Reviewed Radiology: ordered.  Risk Prescription drug management.   Problem List Items Addressed This Visit       Other   Seizure [facility]) - Primary          Lonell Stamos, Barbette Hair, MD 12/20/22 [phone]

## 2022-12-20 NOTE — ED Notes (Signed)
Patient ambulated in hallway, unsteady gait. MD Horton notified.

## 2022-12-20 NOTE — Discharge Instructions (Signed)
You were seen today for seizure activity.  Make sure that you are taking your Keppra as prescribed.  Also you should increase potassium in your diet.  You will be given a short course of potassium supplementation.  Follow-up with your primary doctor for recheck potassium.

## 2022-12-20 NOTE — ED Provider Notes (Signed)
[facility] Provider Note   CSN: [id number] Arrival date & time: 12/19/22  1748     History {Add pertinent medical, surgical, social history, OB history to HPI:1} Chief Complaint  Patient presents with   Seizures    [name] is a 67 y.o. male.  HPI      seizure Home Medications Prior to Admission medications   Medication Sig Start Date End Date Taking? Authorizing Provider  aspirin 81 MG chewable tablet Chew 1 tablet (81 mg total) by mouth daily. 12/02/22   [name], MD  atorvastatin (LIPITOR) 40 MG tablet Take 1 tablet (40 mg total) by mouth daily. 12/02/22   [name], MD  Cholecalciferol (VITAMIN D-3 PO) Take 1 capsule by mouth in the morning.    [provider]  Cyanocobalamin (VITAMIN B-12 PO) Take 1 tablet by mouth in the morning.    [provider]  diazePAM (VALTOCO 10 MG DOSE) 10 MG/0.1ML LIQD Place 10 mg into the nose as needed (seizure lasting greater than 5 minutes). 12/02/22   [name], MD  HUMIRA, 2 PEN, 40 MG/0.4ML PNKT Inject 40 mg into the skin every 14 (fourteen) days. 11/17/22   [provider]  levETIRAcetam (KEPPRA) 750 MG tablet Take 1 tablet (750 mg total) by mouth 2 (two) times daily. 12/02/22   [name], MD  mesalamine (LIALDA) 1.2 g EC tablet Take 2.4 g by mouth 2 (two) times daily. 07/07/22   [provider]  metFORMIN (GLUCOPHAGE) 500 MG tablet Take 500 mg by mouth 2 (two) times daily. 05/23/21   [provider]  potassium citrate (UROCIT-K) 10 MEQ (1080 MG) SR tablet Take 40 mEq by mouth 4 (four) times daily.    [provider]  tadalafil (CIALIS) 20 MG tablet Take 20 mg by mouth daily as needed for erectile dysfunction. 05/04/21   [provider]  tamsulosin (FLOMAX) 0.4 MG CAPS capsule Take 1 capsule (0.4 mg total) by mouth daily after supper. Patient taking differently: Take 0.4 mg by mouth in the  morning. 01/12/17   Earnstine Regal, PA-C  Trospium Chloride 60 MG CP24 Take 60 mg by mouth in the morning. 06/04/22   [provider]      Allergies    Nsaids    Review of Systems   Review of Systems  Physical Exam Updated Vital Signs BP (!) 160/75   Pulse 64   Temp 98.1 F (36.7 C)   Resp 16   SpO2 100%  Physical Exam  ED Results / Procedures / Treatments   Labs (all labs ordered are listed, but only abnormal results are displayed) Labs Reviewed  CBC WITH DIFFERENTIAL/PLATELET - Abnormal; Notable for the following components:      Result Value   Hemoglobin 11.4 (*)    HCT 36.2 (*)    RDW 15.9 (*)    Platelets 143 (*)    All other components within normal limits  COMPREHENSIVE METABOLIC PANEL - Abnormal; Notable for the following components:   Potassium 2.8 (*)    CO2 21 (*)    Glucose, Bld 146 (*)    Calcium 8.8 (*)    Total Protein 5.4 (*)    Albumin 3.1 (*)    All other components within normal limits  CBG MONITORING, ED - Abnormal; Notable for the following components:   Glucose-Capillary 148 (*)    All other components within normal limits  RAPID URINE DRUG SCREEN,  HOSP PERFORMED  ETHANOL  CBG MONITORING, ED    EKG EKG Interpretation  Date/Time:  Tuesday December 19 2022 17:59:50 EST Ventricular Rate:  92 PR Interval:  147 QRS Duration: 86 QT Interval:  346 QTC Calculation: 428 R Axis:   -14 Text Interpretation: Sinus rhythm Borderline ST elevation, anterior leads Confirmed by Thayer Jew [phone]) on 12/20/2022 12:46:45 AM  Radiology MR BRAIN WO CONTRAST  Result Date: 12/20/2022 CLINICAL DATA:  Follow-up examination for stroke. EXAM: MRI HEAD WITHOUT CONTRAST TECHNIQUE: Multiplanar, multiecho pulse sequences of the brain and surrounding structures were obtained without intravenous contrast. COMPARISON:  Prior CT from 12/19/2022 as well as recent MRI from 11/29/2022. FINDINGS: Brain: Cerebral volume within normal limits for age. Scattered  patchy T2/FLAIR hyperintensity involving the periventricular and deep white matter both cerebral hemispheres as well as the pons, consistent with chronic microvascular ischemic disease. Remote lacunar infarct present at the right thalamocapsular region. No abnormal foci of restricted diffusion to suggest acute or subacute ischemia. Gray-white matter differentiation otherwise maintained. No other areas of chronic cortical infarction. No acute or chronic intracranial blood products. No mass lesion, midline shift or mass effect. No hydrocephalus or extra-axial fluid collection. Pituitary gland and suprasellar region within normal limits. Vascular: Right vertebral artery hypoplastic and not well seen. Major intracranial vascular flow voids are otherwise well maintained. Skull and upper cervical spine: Craniocervical junction within normal limits. Bone marrow signal intensity grossly normal. No focal marrow replacing lesion. No scalp soft tissue abnormality. Sinuses/Orbits: Globes orbital soft tissues within normal limits. Paranasal sinuses are largely clear. No mastoid effusion. Other: None. IMPRESSION: 1. No acute intracranial abnormality. 2. 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 Electronically Signed   By: Jeannine Boga M.D.   On: 12/20/2022 01:31   CT Head Wo Contrast  Result Date: 12/19/2022 CLINICAL DATA:  Neurologic deficit. EXAM: CT HEAD WITHOUT CONTRAST TECHNIQUE: Contiguous axial images were obtained from the base of the skull through the vertex without intravenous contrast. RADIATION DOSE REDUCTION: This exam was performed according to the departmental dose-optimization program which includes automated exposure control, adjustment of the mA and/or kV according to patient size and/or use of iterative reconstruction technique. COMPARISON:  Head CT dated 11/29/2022. FINDINGS: Brain: The ventricles and sulci appropriate size for patient's age. The  gray-white matter discrimination is preserved. Probable tiny right thalamic old lacunar infarct. There is no acute intracranial hemorrhage. No mass effect or midline shift. No extra-axial fluid collection. Vascular: No hyperdense vessel or unexpected calcification. Skull: Normal. Negative for fracture or focal lesion. Sinuses/Orbits: No acute finding. Other: None IMPRESSION: 1. No acute intracranial pathology. 2. Probable tiny old right thalamic lacunar infarct. Electronically Signed   By: Anner Crete M.D.   On: 12/19/2022 20:48    Procedures Procedures  {Document cardiac monitor, telemetry assessment procedure when appropriate:1}  Medications Ordered in ED Medications  potassium chloride 10 mEq in 100 mL IVPB (10 mEq Intravenous New Bag/Given 12/20/22 0214)  levETIRAcetam (KEPPRA) IVPB 1500 mg/ 100 mL premix (0 mg Intravenous Stopped 12/19/22 1906)    ED Course/ Medical Decision Making/ A&P Clinical Course as of 12/20/22 1400  Wed Dec 20, 2022  0621 Patient ambulated at bedside.  Patient had a limp but reports prior left hip surgery.  He states he is at his baseline.  His wife is coming to pick him up.  Discussed with him importance of compliance with his seizure medications. [CH]    Clinical Course User Index [CH] Horton,  Barbette Hair, MD   {   Click here for ABCD2, HEART and other calculatorsREFRESH Note before signing :1}                          Medical Decision Making Amount and/or Complexity of Data Reviewed Radiology: ordered.  Risk Prescription drug management.   ***  {Document critical care time when appropriate:1} {Document review of labs and clinical decision tools ie heart score, Chads2Vasc2 etc:1}  {Document your independent review of radiology images, and any outside records:1} {Document your discussion with family members, caretakers, and with consultants:1} {Document social determinants of health affecting pt's care:1} {Document your decision making why or why  not admission, treatments were needed:1} Final Clinical Impression(s) / ED Diagnoses Final diagnoses:  None    Rx / DC Orders ED Discharge Orders     None

## 2022-12-20 NOTE — ED Notes (Signed)
Assumed care of patient at this time

## 2022-12-21 ENCOUNTER — Ambulatory Visit: Payer: 59 | Admitting: Neurology

## 2022-12-21 ENCOUNTER — Telehealth: Payer: Self-pay | Admitting: Neurology

## 2022-12-21 ENCOUNTER — Encounter: Payer: Self-pay | Admitting: Neurology

## 2022-12-21 VITALS — BP 159/78 | HR 65 | Ht 66.0 in | Wt 166.0 lb

## 2022-12-21 DIAGNOSIS — G40909 Epilepsy, unspecified, not intractable, without status epilepticus: Secondary | ICD-10-CM

## 2022-12-21 MED ORDER — LEVETIRACETAM 750 MG PO TABS: 750.0000 mg | ORAL_TABLET | Freq: Two times a day (BID) | ORAL | 3 refills | Status: DC

## 2022-12-21 NOTE — Telephone Encounter (Signed)
Wife(on DPR) reports that on the letter prepared by Dr Krista Blue has pt's name spelled wrong.  Wife is asking for a corrected letter, please call.

## 2022-12-21 NOTE — Telephone Encounter (Signed)
Letter corrected. Please call and let the pt know, thanks.

## 2022-12-21 NOTE — Progress Notes (Signed)
Chief Complaint  Patient presents with   New Patient (Initial Visit)    Rm 13. Patient with wife. Patient still feeling off balance and slight headache.       ASSESSMENT AND PLAN  [name] is a 67 y.o. 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   MEDICAL HISTORY:  [name], is a 67 year old male, accompanied by his wife, seen in request by his primary care doctor [name], for evaluation of epilepsy, initial evaluation December 21, 2022  I reviewed and summarized the referring note.PMHX. 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   He had long history of Crohn's disease, with ileocolonic fistula, multiple intra-abdominal surgeries in the past, still has frequent diarrhea,   He still works full-time at [employer],  [place] his first seizure on November 29, 2022, he was talking with his wife, suddenly had forceful neck turning towards the right side, then had limb tonic-clonic movement, had tongue biting,  Had hospital admission, personally reviewed MRI of the brain with without contrast no acute abnormality  Laboratory evaluations in January 2024 showed normal or negative reticular count, A26, folic acid, lipid panel, CBC hemoglobin of 11.6, RDW of 15.8, CMP showed calcium of 7.8, he has hypokalemia, hypomagnesia with supplement  He was discharged home with Keppra, but patient never filled the prescription  EEG was normal  Second seizure on December 19, 2022, was witnessed by his adopted son, who is also our patient, he dropped to the floor, wake up with paramedic, with tongue biting urinary incontinence,  Laboratory again showed low potassium 2.8, calcium 8.8, decreased albumin 3.1 and total protein 5.4 PHYSICAL EXAM:   Vitals:   12/21/22 0914  BP: (!) 159/78  Pulse: 65  Weight: 166 lb (75.3 kg)  Height: '5\' 6"'$  (1.676 m)   Not recorded     Body mass index is 26.79 kg/m.  PHYSICAL EXAMNIATION:  Gen: NAD, conversant, well nourised, well groomed                     Cardiovascular: Regular rate rhythm, no peripheral edema, warm, nontender. Eyes: Conjunctivae clear without exudates or hemorrhage Neck: Supple, no carotid bruits. Pulmonary: Clear to auscultation bilaterally   NEUROLOGICAL EXAM:  MENTAL STATUS: Speech/cognition: Awake, alert, oriented to history taking and casual conversation CRANIAL NERVES: CN II: Visual fields are full to confrontation. Pupils are round equal and briskly reactive to light. CN III, IV, VI: extraocular movement are normal. No ptosis. CN V: Facial sensation is intact to light touch CN VII: Face is symmetric with normal eye closure  CN VIII: Hearing is normal to causal conversation. CN IX, X: Phonation is normal. CN XI: Head turning and shoulder shrug are intact  MOTOR: There is no pronator drift of out-stretched arms. Muscle bulk and tone are normal. Muscle strength is normal.  REFLEXES: Reflexes are 1 and symmetric at the biceps, triceps, knees, and ankles. Plantar responses are flexor.  SENSORY: Intact to light touch  COORDINATION: There is no trunk or limb dysmetria noted.  GAIT/STANCE: Need push-up to get up seated position, antalgic,  REVIEW OF SYSTEMS:  Full 14 system review of systems performed and notable only for as above All other review of systems were negative.   ALLERGIES: Allergies  Allergen Reactions   Nsaids Other (See Comments)    Crohn's disease, told to avoid  NSAIDs    HOME MEDICATIONS: Current Outpatient Medications  Medication Sig Dispense Refill   aspirin 81 MG chewable tablet Chew 1 tablet (81 mg total) by mouth daily.     atorvastatin (LIPITOR) 40 MG tablet Take 1 tablet (40 mg total) by mouth daily. 30 tablet 2   Cholecalciferol (VITAMIN D-3 PO) Take 1 capsule by mouth in the morning.     Cyanocobalamin (VITAMIN B-12 PO) Take 1 tablet by mouth in the morning.     diazePAM (VALTOCO 10 MG DOSE) 10 MG/0.1ML LIQD Place 10 mg into the nose as needed (seizure lasting greater than 5 minutes). 6 each 0   HUMIRA, 2 PEN, 40 MG/0.4ML PNKT Inject 40 mg into the skin every 14 (fourteen) days.     levETIRAcetam (KEPPRA) 750 MG tablet Take 1 tablet (750 mg total) by mouth 2 (two) times daily. 60 tablet 2   mesalamine (LIALDA) 1.2 g EC tablet Take 2.4 g by mouth 2 (two) times daily.     metFORMIN (GLUCOPHAGE) 500 MG tablet Take 500 mg by mouth 2 (two) times daily.     potassium chloride SA (KLOR-CON M) 20 MEQ tablet Take 2 tablets (40 mEq total) by mouth daily. 10 tablet 0   potassium citrate (UROCIT-K) 10 MEQ (1080 MG) SR tablet Take 40 mEq by mouth 4 (four) times daily.     tadalafil (CIALIS) 20 MG tablet Take 20 mg by mouth daily as needed for erectile dysfunction.     tamsulosin (FLOMAX) 0.4 MG CAPS capsule Take 1 capsule (0.4 mg total) by mouth daily after supper. (Patient taking differently: Take 0.4 mg by mouth in the morning.) 30 capsule 0   Trospium Chloride 60 MG CP24 Take 60 mg by mouth in the morning.     No current facility-administered medications for this visit.    PAST MEDICAL HISTORY: Past Medical History:  Diagnosis Date   Abdominal wall abscess 01/03/2017   Arthritis    Bladder stone    Cancer ([place])    mycosis fungoides   Crohn's disease ([place])    Crohn's disease with abscess ([place]) 07/23/2015   Diabetes mellitus without complication ([facility])    History of aseptic necrosis of bone BILATERAL HIPS   S/P BONE GRAFT   History of kidney  stones    History of mycosis fungoides    S/P ileostomy ([place])     PAST SURGICAL HISTORY: Past Surgical History:  Procedure Laterality Date   BONE GRAFT OF LEFT HIP  11/13/1984   ASEPTIC NECROSIS   COLONOSCOPY     CYSTO/ BILATERAL RETROGRADE PYELOGRAM/ LEFT URETERAL STONE EXTRACTION / BILATERAL STENT PLACEMENT  10/07/2003   CYSTOSCOPY WITH LITHOLAPAXY  02/12/2012   Procedure: CYSTOSCOPY WITH LITHOLAPAXY;  Surgeon: Claybon Jabs, MD;  Location: [facility];  Service: Urology;  Laterality: N/A;   EXPL. LAP. W/ ENTEROLYSIS, RESECTION INFLAMMATORY MASS RLQ / TAKE-DOWN OF FISTULA WITH ENTEROENTEROSTOMY/ RESECTION WITH THE SMALL BOWEL TO ASCENDING COLON ANASTOMOSIS  10/29/2000   CROHN'S DISEASE W/ ANASTOMOTIC INFLAMMATORY MASS/    10-30-2000 EXPL. LAP. CONTROL POST-OP ABD. BLEEDING   EXPLORATORY LAPAROTOMY/ RESECTION  OF ILEOCOLONIC ANASTOMOSIS AND CREATION OF ILEOSTOMY  11/04/2000   ABD. PERFORATION   HARDWARE REMOVAL Left 08/07/2022   Procedure: HARDWARE REMOVAL;  Surgeon: Dorna Leitz, MD;  Location: [facility];  Service: Orthopedics;  Laterality: Left;   [place] N/A 07/23/2015   Procedure: Takedown ileostomy and repair of ostomy hernia, extensive entrolysis (2.5 hrs), ileostransverse colon anastomosis; closure of massive ventral hernia with 20 x 30 Strattice mesh;  Surgeon: Johnathan Hausen, MD;  Location: [facility];  Service: General;  Laterality: N/A;   INCISIONAL HERNIA REPAIR  07/23/2015   Strattice mesh   IRRIGATION AND DEBRIDEMENT ABSCESS Right 09/09/2014   Procedure: MINOR INCISION AND DRAINAGE OF ABSCESS;  Surgeon: Charlotte Crumb, MD;  Location: [facility];  Service: Orthopedics;  Laterality: Right;  incision and drainage right long finger    IRRIGATION AND DEBRIDEMENT ABSCESS N/A 01/03/2017   Procedure: IRRIGATION AND DEBRIDEMENT ABDOMINAL WALL ABSCESS;  Surgeon: Johnathan Hausen, MD;  Location: [facility];   Service: General;  Laterality: N/A;   LAPAROSCOPY N/A 07/23/2015   Procedure: LAPAROSCOPY DIAGNOSTIC;  Surgeon: Johnathan Hausen, MD;  Location: [facility];  Service: General;  Laterality: N/A;   ORIF ANKLE FRACTURE Left 08/27/2014   Procedure:  open reduction internal fixation left ankle;  Surgeon: Nita Sells, MD;  Location: [facility];  Service: Orthopedics;  Laterality: Left;  Left open reduction internal fixation ankle   RIGHT URETEROSCOPIC STONE EXTRACITON  04-24-2005  & 12-01-2002   TOTAL HIP ARTHROPLASTY Left 08/07/2022   Procedure: TOTAL HIP ARTHROPLASTY ANTERIOR APPROACH;  Surgeon: Dorna Leitz, MD;  Location: [facility];  Service: Orthopedics;  Laterality: Left;    FAMILY HISTORY: Family History  Problem Relation Age of Onset   Alzheimer's disease Father    Cancer Father    Cancer Sister     SOCIAL HISTORY: Social History   Socioeconomic History   Marital status: Married    Spouse name: Not on file   Number of children: 3   Years of education: Not on file   Highest education level: Not on file  Occupational History   Not on file  Tobacco Use   Smoking status: Former    Types: Cigarettes    Quit date: 08/26/1994    Years since quitting: 28.3   Smokeless tobacco: Never  Vaping Use   Vaping Use: Never used  Substance and Sexual Activity   Alcohol use: No   Drug use: No   Sexual activity: Never  Other Topics Concern   Not on file  Social History Narrative   Not on file   Social Determinants of Health   Financial Resource Strain: Not on file  Food Insecurity: No Food Insecurity (09/10/2022)   Hunger Vital Sign    Worried About Running Out of Food in the Last Year: Never true    Ran Out of Food in the Last Year: Never true  Transportation Needs: No Transportation Needs (09/10/2022)   PRAPARE - Hydrologist (Medical): No    Lack of Transportation (Non-Medical): No  Physical Activity: Not on file  Stress: Not on file  Social  Connections: Not on file  Intimate Partner Violence: Not At Risk (09/10/2022)   Humiliation, Afraid, Rape, and Kick questionnaire    Fear of Current or Ex-Partner: No    Emotionally Abused: No    Physically Abused: No    Sexually Abused: No  Marcial Pacas, M.D. Ph.D.  [facility] [address] [place], [place] [address] Ph: [phone] Fax: [phone]  CC:  [name], MD [address] [place],  [place] [address]  [name], MD

## 2022-12-21 NOTE — Telephone Encounter (Signed)
Called pt wife (per DPR). Informed her that the letter had been corrected. She said I will go to [facility] and print it out.

## 2022-12-24 ENCOUNTER — Encounter (HOSPITAL_COMMUNITY): Payer: Self-pay | Admitting: *Deleted

## 2022-12-24 ENCOUNTER — Emergency Department (HOSPITAL_COMMUNITY): Payer: 59

## 2022-12-24 ENCOUNTER — Other Ambulatory Visit: Payer: Self-pay

## 2022-12-24 ENCOUNTER — Inpatient Hospital Stay (HOSPITAL_COMMUNITY)
Admission: EM | Admit: 2022-12-24 | Discharge: 2022-12-28 | DRG: 385 | Disposition: A | Discharge status: Home / Self Care | Payer: 59 | Attending: Internal Medicine | Admitting: Internal Medicine

## 2022-12-24 DIAGNOSIS — K5651 Intestinal adhesions [bands], with partial obstruction: Secondary | ICD-10-CM | POA: Diagnosis present

## 2022-12-24 DIAGNOSIS — Z7984 Long term (current) use of oral hypoglycemic drugs: Secondary | ICD-10-CM | POA: Diagnosis not present

## 2022-12-24 DIAGNOSIS — N400 Enlarged prostate without lower urinary tract symptoms: Secondary | ICD-10-CM | POA: Diagnosis present

## 2022-12-24 DIAGNOSIS — Z96642 Presence of left artificial hip joint: Secondary | ICD-10-CM | POA: Diagnosis present

## 2022-12-24 DIAGNOSIS — Z79899 Other long term (current) drug therapy: Secondary | ICD-10-CM | POA: Diagnosis not present

## 2022-12-24 DIAGNOSIS — K50013 Crohn's disease of small intestine with fistula: Principal | ICD-10-CM | POA: Diagnosis present

## 2022-12-24 DIAGNOSIS — Z8572 Personal history of non-Hodgkin lymphomas: Secondary | ICD-10-CM | POA: Diagnosis not present

## 2022-12-24 DIAGNOSIS — G40909 Epilepsy, unspecified, not intractable, without status epilepticus: Secondary | ICD-10-CM | POA: Diagnosis present

## 2022-12-24 DIAGNOSIS — K50919 Crohn's disease, unspecified, with unspecified complications: Secondary | ICD-10-CM

## 2022-12-24 DIAGNOSIS — Z809 Family history of malignant neoplasm, unspecified: Secondary | ICD-10-CM | POA: Diagnosis not present

## 2022-12-24 DIAGNOSIS — K529 Noninfective gastroenteritis and colitis, unspecified: Secondary | ICD-10-CM | POA: Diagnosis present

## 2022-12-24 DIAGNOSIS — K219 Gastro-esophageal reflux disease without esophagitis: Secondary | ICD-10-CM | POA: Diagnosis present

## 2022-12-24 DIAGNOSIS — R109 Unspecified abdominal pain: Secondary | ICD-10-CM | POA: Diagnosis not present

## 2022-12-24 DIAGNOSIS — E781 Pure hyperglyceridemia: Secondary | ICD-10-CM | POA: Diagnosis present

## 2022-12-24 DIAGNOSIS — R4182 Altered mental status, unspecified: Secondary | ICD-10-CM | POA: Diagnosis not present

## 2022-12-24 DIAGNOSIS — G9341 Metabolic encephalopathy: Secondary | ICD-10-CM | POA: Diagnosis present

## 2022-12-24 DIAGNOSIS — Z7982 Long term (current) use of aspirin: Secondary | ICD-10-CM

## 2022-12-24 DIAGNOSIS — E042 Nontoxic multinodular goiter: Secondary | ICD-10-CM | POA: Diagnosis present

## 2022-12-24 DIAGNOSIS — Z1152 Encounter for screening for COVID-19: Secondary | ICD-10-CM

## 2022-12-24 DIAGNOSIS — K56609 Unspecified intestinal obstruction, unspecified as to partial versus complete obstruction: Principal | ICD-10-CM

## 2022-12-24 DIAGNOSIS — Z820 Family history of epilepsy and other diseases of the nervous system: Secondary | ICD-10-CM | POA: Diagnosis not present

## 2022-12-24 DIAGNOSIS — I959 Hypotension, unspecified: Secondary | ICD-10-CM | POA: Diagnosis not present

## 2022-12-24 DIAGNOSIS — E119 Type 2 diabetes mellitus without complications: Secondary | ICD-10-CM | POA: Diagnosis present

## 2022-12-24 DIAGNOSIS — R680 Hypothermia, not associated with low environmental temperature: Secondary | ICD-10-CM | POA: Diagnosis not present

## 2022-12-24 DIAGNOSIS — Z9911 Dependence on respirator [ventilator] status: Secondary | ICD-10-CM | POA: Diagnosis not present

## 2022-12-24 DIAGNOSIS — Z87891 Personal history of nicotine dependence: Secondary | ICD-10-CM

## 2022-12-24 DIAGNOSIS — Z87442 Personal history of urinary calculi: Secondary | ICD-10-CM | POA: Diagnosis not present

## 2022-12-24 LAB — CBC
HCT: 44.6 % (ref 39.0–52.0)
Hemoglobin: 14.1 g/dL (ref 13.0–17.0)
MCH: 26.4 pg (ref 26.0–34.0)
MCHC: 31.6 g/dL (ref 30.0–36.0)
MCV: 83.4 fL (ref 80.0–100.0)
Platelets: 208 10*3/uL (ref 150–400)
RBC: 5.35 MIL/uL (ref 4.22–5.81)
RDW: 15.6 % — ABNORMAL HIGH (ref 11.5–15.5)
WBC: 13.8 10*3/uL — ABNORMAL HIGH (ref 4.0–10.5)
nRBC: 0 % (ref 0.0–0.2)

## 2022-12-24 LAB — COMPREHENSIVE METABOLIC PANEL
ALT: 11 U/L (ref 0–44)
AST: 19 U/L (ref 15–41)
Albumin: 3.7 g/dL (ref 3.5–5.0)
Alkaline Phosphatase: 57 U/L (ref 38–126)
Anion gap: 12 (ref 5–15)
BUN: 11 mg/dL (ref 8–23)
CO2: 25 mmol/L (ref 22–32)
Calcium: 9.4 mg/dL (ref 8.9–10.3)
Chloride: 102 mmol/L (ref 98–111)
Creatinine, Ser: 1.03 mg/dL (ref 0.61–1.24)
GFR, Estimated: >60 mL/min (ref >60)
Glucose, Bld: 192 mg/dL — ABNORMAL HIGH (ref 70–99)
Potassium: 4 mmol/L (ref 3.5–5.1)
Sodium: 139 mmol/L (ref 135–145)
Total Bilirubin: 0.8 mg/dL (ref 0.3–1.2)
Total Protein: 6.8 g/dL (ref 6.5–8.1)

## 2022-12-24 LAB — URINALYSIS, ROUTINE W REFLEX MICROSCOPIC
Bacteria, UA: NONE SEEN
Bilirubin Urine: NEGATIVE
Glucose, UA: NEGATIVE mg/dL
Hgb urine dipstick: NEGATIVE
Ketones, ur: 5 mg/dL — AB
Leukocytes,Ua: NEGATIVE
Nitrite: NEGATIVE
Protein, ur: 100 mg/dL — AB
Specific Gravity, Urine: 1.036 — ABNORMAL HIGH (ref 1.005–1.030)
pH: 8 (ref 5.0–8.0)

## 2022-12-24 LAB — I-STAT ARTERIAL BLOOD GAS, ED
Acid-Base Excess: 1 mmol/L (ref 0.0–2.0)
Bicarbonate: 26.2 mmol/L (ref 20.0–28.0)
Calcium, Ion: 1.15 mmol/L (ref 1.15–1.40)
HCT: 32 % — ABNORMAL LOW (ref 39.0–52.0)
Hemoglobin: 10.9 g/dL — ABNORMAL LOW (ref 13.0–17.0)
O2 Saturation: 100 %
Potassium: 3.7 mmol/L (ref 3.5–5.1)
Sodium: 138 mmol/L (ref 135–145)
TCO2: 27 mmol/L (ref 22–32)
pCO2 arterial: 42.7 mmHg (ref 32–48)
pH, Arterial: 7.396 (ref 7.35–7.45)
pO2, Arterial: 545 mmHg — ABNORMAL HIGH (ref 83–108)

## 2022-12-24 LAB — I-STAT CHEM 8, ED
BUN: 11 mg/dL (ref 8–23)
Calcium, Ion: 1.13 mmol/L — ABNORMAL LOW (ref 1.15–1.40)
Chloride: 106 mmol/L (ref 98–111)
Creatinine, Ser: 0.9 mg/dL (ref 0.61–1.24)
Glucose, Bld: 188 mg/dL — ABNORMAL HIGH (ref 70–99)
HCT: 46 % (ref 39.0–52.0)
Hemoglobin: 15.6 g/dL (ref 13.0–17.0)
Potassium: 4 mmol/L (ref 3.5–5.1)
Sodium: 140 mmol/L (ref 135–145)
TCO2: 25 mmol/L (ref 22–32)

## 2022-12-24 LAB — LIPASE, BLOOD: Lipase: 31 U/L (ref 11–51)

## 2022-12-24 MED ORDER — PANTOPRAZOLE SODIUM 40 MG IV SOLR
40.0000 mg | Freq: Every day | INTRAVENOUS | Status: DC
  Administered 2022-12-25 – 2022-12-28 (×4): 40 mg via INTRAVENOUS
  Filled 2022-12-24 (×5): qty 10

## 2022-12-24 MED ORDER — ETOMIDATE 2 MG/ML IV SOLN: 20.0000 mg | Freq: Once | INTRAVENOUS | Status: DC

## 2022-12-24 MED ORDER — SODIUM CHLORIDE 0.9 % IV SOLN
1000.0000 mL | INTRAVENOUS | Status: DC
  Administered 2022-12-24: 1000 mL via INTRAVENOUS

## 2022-12-24 MED ORDER — ROCURONIUM BROMIDE 50 MG/5ML IV SOLN
100.0000 mg | Freq: Once | INTRAVENOUS | Status: DC
  Filled 2022-12-24: qty 10

## 2022-12-24 MED ORDER — PROPOFOL 1000 MG/100ML IV EMUL
0.0000 ug/kg/min | INTRAVENOUS | Status: DC
  Administered 2022-12-25: 45 ug/kg/min via INTRAVENOUS
  Administered 2022-12-25: 25 ug/kg/min via INTRAVENOUS
  Administered 2022-12-25: 45 ug/kg/min via INTRAVENOUS
  Administered 2022-12-25 – 2022-12-26 (×2): 10 ug/kg/min via INTRAVENOUS
  Filled 2022-12-24 (×4): qty 100

## 2022-12-24 MED ORDER — PROPOFOL 1000 MG/100ML IV EMUL
0.0000 ug/kg/min | INTRAVENOUS | Status: DC
  Administered 2022-12-24: 20 ug/kg/min via INTRAVENOUS

## 2022-12-24 MED ORDER — ROCURONIUM BROMIDE 50 MG/5ML IV SOLN
INTRAVENOUS | Status: AC | PRN
  Administered 2022-12-24: 90 mg via INTRAVENOUS

## 2022-12-24 MED ORDER — LACTATED RINGERS IV SOLN: INTRAVENOUS | Status: DC

## 2022-12-24 MED ORDER — FENTANYL BOLUS VIA INFUSION: 25.0000 ug | INTRAVENOUS | Status: DC | PRN

## 2022-12-24 MED ORDER — FENTANYL BOLUS VIA INFUSION
25.0000 ug | INTRAVENOUS | Status: DC | PRN
  Administered 2022-12-25: 75 ug via INTRAVENOUS
  Administered 2022-12-25: 50 ug via INTRAVENOUS

## 2022-12-24 MED ORDER — ENOXAPARIN SODIUM 40 MG/0.4ML IJ SOSY
40.0000 mg | PREFILLED_SYRINGE | Freq: Every day | INTRAMUSCULAR | Status: DC
  Administered 2022-12-25 – 2022-12-28 (×4): 40 mg via SUBCUTANEOUS
  Filled 2022-12-24 (×4): qty 0.4

## 2022-12-24 MED ORDER — SODIUM CHLORIDE 0.9 % IV SOLN
750.0000 mg | Freq: Two times a day (BID) | INTRAVENOUS | Status: DC
  Administered 2022-12-25 – 2022-12-28 (×8): 750 mg via INTRAVENOUS
  Filled 2022-12-24 (×13): qty 7.5

## 2022-12-24 MED ORDER — FENTANYL 2500MCG IN NS 250ML (10MCG/ML) PREMIX INFUSION
25.0000 ug/h | INTRAVENOUS | Status: DC
  Administered 2022-12-24: 150 ug/h via INTRAVENOUS
  Filled 2022-12-24: qty 250

## 2022-12-24 MED ORDER — IOHEXOL 350 MG/ML SOLN
100.0000 mL | Freq: Once | INTRAVENOUS | Status: AC | PRN
  Administered 2022-12-24: 100 mL via INTRAVENOUS

## 2022-12-24 MED ORDER — ONDANSETRON HCL 4 MG/2ML IJ SOLN
4.0000 mg | Freq: Four times a day (QID) | INTRAMUSCULAR | Status: DC | PRN
  Administered 2022-12-26: 4 mg via INTRAVENOUS
  Filled 2022-12-24: qty 2

## 2022-12-24 MED ORDER — INSULIN ASPART 100 UNIT/ML IJ SOLN
0.0000 [IU] | INTRAMUSCULAR | Status: DC
  Administered 2022-12-25: 1 [IU] via SUBCUTANEOUS
  Administered 2022-12-25 (×2): 2 [IU] via SUBCUTANEOUS
  Administered 2022-12-25: 1 [IU] via SUBCUTANEOUS
  Administered 2022-12-25: 2 [IU] via SUBCUTANEOUS
  Administered 2022-12-25 – 2022-12-26 (×2): 1 [IU] via SUBCUTANEOUS
  Administered 2022-12-26: 2 [IU] via SUBCUTANEOUS
  Administered 2022-12-26: 1 [IU] via SUBCUTANEOUS

## 2022-12-24 MED ORDER — ONDANSETRON HCL 4 MG/2ML IJ SOLN
4.0000 mg | Freq: Once | INTRAMUSCULAR | Status: AC
  Administered 2022-12-24: 4 mg via INTRAVENOUS

## 2022-12-24 MED ORDER — FENTANYL 2500MCG IN NS 250ML (10MCG/ML) PREMIX INFUSION
25.0000 ug/h | INTRAVENOUS | Status: DC
  Administered 2022-12-25: 175 ug/h via INTRAVENOUS
  Administered 2022-12-25: 50 ug/h via INTRAVENOUS
  Filled 2022-12-24: qty 250

## 2022-12-24 MED ORDER — PIPERACILLIN-TAZOBACTAM 3.375 G IVPB
3.3750 g | Freq: Three times a day (TID) | INTRAVENOUS | Status: DC
  Administered 2022-12-25 – 2022-12-26 (×5): 3.375 g via INTRAVENOUS
  Filled 2022-12-24 (×7): qty 50

## 2022-12-24 MED ORDER — SODIUM CHLORIDE 0.9 % IV BOLUS (SEPSIS)
1000.0000 mL | Freq: Once | INTRAVENOUS | Status: AC
  Administered 2022-12-24: 1000 mL via INTRAVENOUS

## 2022-12-24 MED ORDER — ETOMIDATE 2 MG/ML IV SOLN
INTRAVENOUS | Status: AC | PRN
  Administered 2022-12-24: 25 mg via INTRAVENOUS

## 2022-12-24 NOTE — ED Provider Notes (Signed)
[facility] Provider Note   CSN: [id number] Arrival date & time: 12/24/22  1939     History  Chief Complaint  Patient presents with  . Abdominal Pain    [name] is a 67 y.o. male.   Abdominal Pain  Patient has a history of Crohn's disease kidney stones, ileostomy, diabetes.  Patient was recently hospital for seizures earlier this month.  Patient also has history of admission to the hospital in October of last year for Crohn's disease.  Patient presented to the ED for evaluation of altered mental status vomiting abdominal distention.  Initial EMS report was that patient had acute onset of abdominal pain.  Patient indicated that he was hurting all over.  On arrival to the ED patient was just moaning and crying out in pain and would not answer any questions.    Home Medications Prior to Admission medications   Medication Sig Start Date End Date Taking? Authorizing Provider  aspirin 81 MG chewable tablet Chew 1 tablet (81 mg total) by mouth daily. 12/02/22   [name], MD  atorvastatin (LIPITOR) 40 MG tablet Take 1 tablet (40 mg total) by mouth daily. 12/02/22   [name], MD  Cholecalciferol (VITAMIN D-3 PO) Take 1 capsule by mouth in the morning.    [provider]  Cyanocobalamin (VITAMIN B-12 PO) Take 1 tablet by mouth in the morning.    [provider]  diazePAM (VALTOCO 10 MG DOSE) 10 MG/0.1ML LIQD Place 10 mg into the nose as needed (seizure lasting greater than 5 minutes). 12/02/22   [name], MD  HUMIRA, 2 PEN, 40 MG/0.4ML PNKT Inject 40 mg into the skin every 14 (fourteen) days. 11/17/22   [provider]  levETIRAcetam (KEPPRA) 750 MG tablet Take 1 tablet (750 mg total) by mouth 2 (two) times daily. 12/21/22   Marcial Pacas, MD  mesalamine (LIALDA) 1.2 g EC tablet Take 2.4 g by mouth 2 (two) times daily. 07/07/22   [provider]  metFORMIN (GLUCOPHAGE) 500 MG tablet  Take 500 mg by mouth 2 (two) times daily. 05/23/21   [provider]  potassium chloride SA (KLOR-CON M) 20 MEQ tablet Take 2 tablets (40 mEq total) by mouth daily. 12/20/22   Horton, Barbette Hair, MD  potassium citrate (UROCIT-K) 10 MEQ (1080 MG) SR tablet Take 40 mEq by mouth 4 (four) times daily.    [provider]  tadalafil (CIALIS) 20 MG tablet Take 20 mg by mouth daily as needed for erectile dysfunction. 05/04/21   [provider]  tamsulosin (FLOMAX) 0.4 MG CAPS capsule Take 1 capsule (0.4 mg total) by mouth daily after supper. Patient taking differently: Take 0.4 mg by mouth in the morning. 01/12/17   Earnstine Regal, PA-C  Trospium Chloride 60 MG CP24 Take 60 mg by mouth in the morning. 06/04/22   [provider]      Allergies    Nsaids    Review of Systems   Review of Systems  Gastrointestinal:  Positive for abdominal pain.    Physical Exam Updated Vital Signs BP 130/79   Pulse 80   Temp (!) 96.6 F (35.9 C)   Resp 16   SpO2 100%  Physical Exam Vitals and nursing note reviewed.  Constitutional:      General: He is in acute distress.     Appearance: He is ill-appearing.  HENT:     Head: Normocephalic and atraumatic.  Right Ear: External ear normal.     Left Ear: External ear normal.  Eyes:     General: No scleral icterus.       Right eye: No discharge.        Left eye: No discharge.     Conjunctiva/sclera: Conjunctivae normal.  Neck:     Trachea: No tracheal deviation.  Cardiovascular:     Rate and Rhythm: Normal rate and regular rhythm.  Pulmonary:     Effort: Pulmonary effort is normal. No respiratory distress.     Breath sounds: Normal breath sounds. No stridor. No wheezing or rales.  Abdominal:     General: Abdomen is protuberant. Bowel sounds are normal. There is distension.     Palpations: Abdomen is soft.     Tenderness: There is generalized abdominal tenderness. There is no guarding or rebound.     Comments:  Distended abdomen, questionable palpable loops of bowel  Musculoskeletal:        General: No tenderness or deformity.     Cervical back: Neck supple.  Skin:    General: Skin is warm and dry.     Findings: No rash.  Neurological:     General: No focal deficit present.     Cranial Nerves: No cranial nerve deficit or facial asymmetry.     Motor: No abnormal muscle tone or seizure activity.     Comments: Patient moaning, not answering any questions  Psychiatric:        Mood and Affect: Mood normal.     ED Results / Procedures / Treatments   Labs (all labs ordered are listed, but only abnormal results are displayed) Labs Reviewed  COMPREHENSIVE METABOLIC PANEL - Abnormal; Notable for the following components:      Result Value   Glucose, Bld 192 (*)    All other components within normal limits  CBC - Abnormal; Notable for the following components:   WBC 13.8 (*)    RDW 15.6 (*)    All other components within normal limits  URINALYSIS, ROUTINE W REFLEX MICROSCOPIC - Abnormal; Notable for the following components:   Specific Gravity, Urine 1.036 (*)    Ketones, ur 5 (*)    Protein, ur 100 (*)    All other components within normal limits  I-STAT CHEM 8, ED - Abnormal; Notable for the following components:   Glucose, Bld 188 (*)    Calcium, Ion 1.13 (*)    All other components within normal limits  I-STAT ARTERIAL BLOOD GAS, ED - Abnormal; Notable for the following components:   pO2, Arterial 545 (*)    HCT 32.0 (*)    Hemoglobin 10.9 (*)    All other components within normal limits  LIPASE, BLOOD  TRIGLYCERIDES  BLOOD GAS, ARTERIAL    EKG EKG Interpretation  Date/Time:  Sunday December 24 2022 19:46:22 EST Ventricular Rate:  106 PR Interval:  136 QRS Duration: 86 QT Interval:  341 QTC Calculation: 453 R Axis:   38 Text Interpretation: Sinus tachycardia Paired ventricular premature complexes Aberrant conduction of SV complex(es) Left atrial enlargement Borderline low  voltage, extremity leads ST elevation, consider anterior injury Since last tracing rate faster Confirmed by Dorie Rank [phone]) on 12/24/2022 7:55:45 PM  Radiology CT Angio Chest/Abd/Pel for Dissection W and/or Wo Contrast  Result Date: 12/24/2022 CLINICAL DATA:  Acute aortic syndrome suspected. Bowel obstruction. Abdominal pain. History of Crohn's disease. EXAM: CT ANGIOGRAPHY CHEST, ABDOMEN AND PELVIS TECHNIQUE: Initial noncontrast chest CT was performed axial only, from  the lung apices to the adrenal glands. Multidetector CT imaging through the chest, abdomen and pelvis was performed using the standard protocol during bolus administration of intravenous contrast. Multiplanar reconstructed images and MIPs were obtained and reviewed to evaluate the vascular anatomy. RADIATION DOSE REDUCTION: This exam was performed according to the departmental dose-optimization program which includes automated exposure control, adjustment of the mA and/or kV according to patient size and/or use of iterative reconstruction technique. CONTRAST:  100 mL Omnipaque 350 IV. COMPARISON:  No prior chest CT for comparison. Most recent chest x-ray was portable chest today, portable chest 11/29/2022. Comparison is made with abdomen and pelvis CTs with IV contrast from 11/29/2022 and 09/10/2022. FINDINGS: CTA CHEST FINDINGS Cardiovascular: There is mild cardiomegaly with a left chamber predominance. Minimal anterior pericardial effusion. There is scattered calcific plaque in the LAD coronary artery only. Left ventricular wall hypertrophy is seen except for a small portion of the apex which demonstrates marked wall thinning consistent with a small chronic apical infarct. The pulmonary veins are decompressed. Pulmonary arteries are normal caliber and centrally clear. The aorta is mildly tortuous. There is minimal scattered soft plaque in the descending aorta with no other visible thoracic aortic plaques. There is no thoracic aortic aneurysm,  dissection or stenosis. The great vessels are clear and branch normally. No wall calcification is seen. Mediastinum/Nodes: The patient is intubated. Tip of the ETT is 2.5 cm from the carina. NG tube loops around the stomach towards the left, the tip in the proximal upper stomach. The thoracic esophagus is unremarkable. The trachea is otherwise clear. No intrathoracic, axillary or supraclavicular adenopathy is seen. 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 On the right there is a heterogeneous nodule measuring 1.4 cm. Nonemergent thyroid ultrasound is recommended. The left sided mass displaces the trachea left of midline but does not narrow it. Lungs/Pleura: There are trace pleural effusions. No pleural thickening or pneumothorax. The lungs are hypoexpanded. There is posterior basal opacity on both sides which could be atelectasis or combination of atelectasis and pneumonia. There are minimal paraseptal emphysematous changes in the extreme lung apices. Remaining lungs are clear. Musculoskeletal: Mild thoracic dextroscoliosis and degenerative change thoracic spine. 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 There is secondary DJD in the right shoulder. No destructive or aggressive bone lesion. The ribcage is intact. Review of the MIP images confirms the above findings. CTA ABDOMEN AND PELVIS FINDINGS VASCULAR Aorta: There are minimal scattered calcific plaques in the infrarenal aorta. There is no stenosis, dissection, or aneurysm or penetrating ulcer. Celiac: Normal. SMA: Normal. Renals: Normal. IMA: Normal. Inflow: Minimal calcification right common iliac artery. Remainder of the bilateral inflow vessels show no plaques. All inflow arteries are widely patent. The visualized outflow arteries are normal. Veins: Patent SVC. Other systemic  veins and the portal veins are unopacified and not evaluated. 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 Review of the MIP images confirms the above findings. NON-VASCULAR Hepatobiliary: 18 cm in length liver with mild steatosis. No mass enhancement. Multiple cysts are again noted largest is 8 cm in the left lobe. Others are significantly smaller. Gallbladder and bile ducts are unremarkable. Pancreas: No abnormality. Spleen: No abnormality. Adrenals/Urinary Tract: There is no adrenal mass. Bilateral nephrolithiasis with scattered punctate up to 1 mm stones on the  right, scattered 1-2 mm stones on the left, 1.4 cm stone in the left upper pole and irregular 1 cm stone in the left lower pole. There are multiple bilateral too small to characterize hypodensities in both kidneys. No follow-up imaging is recommended [facility] 2018 Feb; 264-273, Management of the Incidental Renal Mass on CT, [employer] 2021; 814-848, Bosniak Classification of Cystic Renal Masses, Version 2019). No new cortical abnormality is seen. There is bilateral renal cortical thinning on the left-greater-than-right. Scattered small cortical scarring. Chronic pyelectasis on the left-greater-than-right is seen, without evidence of ureteral stones or ureteral dilatation. The bladder is poorly visualized due to spray artifact from a left hip arthroplasty, but unremarkable where seen. Stomach/Bowel: Mild fluid distention of the stomach. NGT in place as described above. Surgical changes of right hemicolectomy, distal small bowel resection and primary ileocolic surgical anastomosis are redemonstrated. There is a small defect in the right anterolateral mid abdominal wall at the level of the surgical anastomosis. A fluid-filled small caliber tubular structure extends through the defect to the skin surface, with surrounding skin thickening, unknown if this is a purposeful ileostomy or a fistula from  nearby small bowel but there did not seem to be communication with the skin surface on the prior studies. There is additional defect in the right lateral abdominal wall below this level with fat hernia, multilevel postsurgical changes to the more anterior abdominal wall and asymmetric right rectus atrophy and abdominal wall thinning. Again noted is wall thickening in the distal ileum extending up to the surgical anastomosis, but the inflammatory changes involving this loop (anterior right lower abdomen, series 14 axial [id number]) are not as pronounced as previously. There is increased dilatation of multiple upstream small bowel segments above this level as well as below this level up to 4.2 cm and multiple small bowel loops adherent to the anterior wall. A surgically widened segment of small bowel is also more dilated than previously, currently 7 cm anterior mid abdomen, previously 5.4 cm. The transition to decompressed caliber appears to be at the inferior aspect of the surgically widened segment on coronal reconstruction series 10 images 21-24, with the only normal caliber segment in this area being the inflamed distal ileal anastomotic segment. The gastric wall, duodenum and jejunum appear normal. The large bowel wall is normal thickness with scattered diverticulosis. Lymphatic: No adenopathy is seen. Reproductive: The prostate poorly visualized due to artifact from the left hip replacement but does measure enlarged at 4.9 cm. Other: No free fluid, free hemorrhage or free air. No incarcerated hernia. 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 Mild chronic compression fractures L1 and L2. No destructive bone lesion is seen. Review of the MIP images confirms the above findings. IMPRESSION: 1. No findings of aortic aneurysm or dissection. Minimal abdominal aortic and iliac atherosclerosis. 2. Cardiomegaly with left chamber predominance, small  anterior pericardial effusion. 3. 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 4. Trace pleural effusions with bilateral posterior basal opacity which could be atelectasis, pneumonia or combination. 5. Bilateral thyroid nodules, largest on the left measuring 4.0 x 2.7 x 4.6 cm. Nonemergent thyroid ultrasound recommended. 6. Bilateral nephrolithiasis with chronic pyelectasis left-greater-than-right, but no ureteral stones or ureteral dilatation. 7. Surgical changes of right hemicolectomy, distal small bowel resection and primary ileocolic surgical anastomosis. 8. Small defect in the right anterolateral mid abdominal wall at the level of the surgical anastomosis. There is a fluid-filled tubular structure extending  through the defect to the skin surface, unknown if this is a purposeful ileostomy or a fistula from nearby small bowel. There did not seem to be communication with the skin surface on the prior studies. 9. Increased dilatation of multiple small bowel segments, with transition to decompressed caliber at the inferior aspect of the anterior mid abdominal surgically widened segment just above the thickened distal ileal anastomotic segment. Findings consistent with small-bowel obstruction probably related to distal ileitis. Inflammatory changes along the anastomotic segment are less prominent than previously. 10. Minimal apical emphysema. 11. Stable hepatic cysts. 12. Bilateral renal cortical thinning and stones. No obstructing stones. 13. Prostatomegaly. 14. Diverticulosis. Emphysema (ICD10-J43.9). Electronically Signed   By: Telford Nab M.D.   On: 12/24/2022 22:20   CT Head Wo Contrast  Result Date: 12/24/2022 CLINICAL DATA:  Mental status change, unknown cause EXAM: CT HEAD WITHOUT CONTRAST TECHNIQUE: Contiguous axial images were obtained from the base of the skull through the vertex without intravenous contrast. RADIATION DOSE REDUCTION: This  exam was performed according to the departmental dose-optimization program which includes automated exposure control, adjustment of the mA and/or kV according to patient size and/or use of iterative reconstruction technique. COMPARISON:  CT head 12/19/2022 FINDINGS: Brain: Stable possible chronic lacunar infarction of the right posterior limb of the internal capsule versus CSF space. No evidence of large-territorial acute infarction. No parenchymal hemorrhage. No mass lesion. No extra-axial collection. No mass effect or midline shift. No hydrocephalus. Basilar cisterns are patent. Vascular: No hyperdense vessel. Atherosclerotic calcifications are present within the cavernous internal carotid and vertebral arteries. Skull: No acute fracture or focal lesion. Sinuses/Orbits: Paranasal sinuses and mastoid air cells are clear. The orbits are unremarkable. Other: None. IMPRESSION: No acute intracranial abnormality. Electronically Signed   By: Iven Finn M.D.   On: 12/24/2022 21:30   DG Abdomen 1 View  Result Date: 12/24/2022 CLINICAL DATA:  abd pain, ng tube EXAM: ABDOMEN - 1 VIEW COMPARISON:  X-ray abdomen 11/30/2022 FINDINGS: Enteric tube courses below the hemidiaphragm with tip and side port overlying the expected region of the gastric lumen. Large loop of enteric tube noted within the gastric lumen inferior to the hemidiaphragm. The bowel gas pattern is normal. No radio-opaque calculi or other significant radiographic abnormality are seen. IMPRESSION: Enteric tube in good position with large loop within the gastric lumen. Recommend retracting by 10 cm. Electronically Signed   By: Iven Finn M.D.   On: 12/24/2022 20:45   DG Chest Portable 1 View  Result Date: 12/24/2022 CLINICAL DATA:  ET tube placement. EXAM: PORTABLE CHEST 1 VIEW COMPARISON:  12/24/2022 at 8:16 p.m. FINDINGS: Endotracheal tube has been retracted, tip now projecting 3.2 cm above the carina. No other change. Nasal/orogastric tube is  stable and well positioned. IMPRESSION: 1. Repositioned, well-positioned endotracheal tube. Electronically Signed   By: Lajean Manes M.D.   On: 12/24/2022 20:38   DG Chest Portable 1 View  Result Date: 12/24/2022 CLINICAL DATA:  ET tube placement. EXAM: PORTABLE CHEST 1 VIEW COMPARISON:  11/29/2022. FINDINGS: Endotracheal tube tip lies at the upper carina. Nasal/orogastric tube passes well below the diaphragm and into the stomach. Cardiac silhouette normal in size.  No mediastinal or hilar masses. Lung volumes are low. Mild opacity at the lung bases is consistent with atelectasis. Remainder of the lungs is clear. No convincing pleural effusion and no pneumothorax. IMPRESSION: 1. Endotracheal tube tip projects at the upper carina. 2. Well-positioned nasal/orogastric tube. 3. Low lung volumes with mild basilar atelectasis. Electronically  Signed   By: Lajean Manes M.D.   On: 12/24/2022 20:37    Procedures .Critical Care  Performed by: Dorie Rank, MD Authorized by: Dorie Rank, MD   Critical care provider statement:    Critical care time (minutes):  50   Critical care was time spent personally by me on the following activities:  Development of treatment plan with patient or surrogate, discussions with consultants, evaluation of patient's response to treatment, examination of patient, ordering and review of laboratory studies, ordering and review of radiographic studies, ordering and performing treatments and interventions, pulse oximetry, re-evaluation of patient's condition and review of old charts Date/Time: 12/24/2022 11:11 PM  Performed by: Dorie Rank, MDPre-anesthesia Checklist: Suction available, Timeout performed, Emergency Drugs available, Patient identified and Patient being monitored Oxygen Delivery Method: Non-rebreather mask Induction Type: Rapid sequence Laryngoscope Size: Glidescope Tube size: 7.5 mm Number of attempts: 1 Airway Equipment and Method: Video-laryngoscopy Dental Injury:  Teeth and Oropharynx as per pre-operative assessment         Medications Ordered in ED Medications  sodium chloride 0.9 % bolus 1,000 mL (0 mLs Intravenous Stopped 12/24/22 2032)    Followed by  0.9 %  sodium chloride infusion (1,000 mLs Intravenous New Bag/Given 12/24/22 2119)  rocuronium (ZEMURON) injection 100 mg (100 mg Intravenous Not Given 12/24/22 2127)  etomidate (AMIDATE) injection 20 mg (20 mg Intravenous Not Given 12/24/22 2120)  fentaNYL 2583mg in NS 2562m(1080mml) infusion-PREMIX (150 mcg/hr Intravenous Infusion Verify 12/24/22 2128)  fentaNYL (SUBLIMAZE) bolus via infusion 25-100 mcg (has no administration in time range)  propofol (DIPRIVAN) 1000 MG/100ML infusion (40 mcg/kg/min  75.3 kg Intravenous Infusion Verify 12/24/22 2128)  ondansetron (ZOFRAN) injection 4 mg (4 mg Intravenous Given 12/24/22 2003)  etomidate (AMIDATE) injection (25 mg Intravenous Given 12/24/22 2005)  iohexol (OMNIPAQUE) 350 MG/ML injection 100 mL (100 mLs Intravenous Contrast Given 12/24/22 2123)  rocuronium (ZEMURON) injection (90 mg Intravenous Given 12/24/22 2005)    ED Course/ Medical Decision Making/ A&P Clinical Course as of 12/25/22 0009  Sun Dec 24, 2022  2020 I-STAT shows normal hemoglobin and normal BUN and creatinine [JK]  2026 Patient vomiting at the bedside, not responding to any questions or commands.  Patient intubated for airway protection and in order to facilitate his evaluation [JK]  2232 Patient CT scan does not show any evidence of dissection or aneurysm.  Patient does have a defect in the right anterolateral mid abdomen.  Questionable ileostomy or fistula noted.  Patient also has dilatation of multiple small bowel segments.  Consistent with a small bowel obstruction. [JK]  Mon Dec 25, 2022  0008 Case discussed with Dr. BraAlessandra BevelsHe recommends IV steroids.  [facility] will see patient in the a.m. [JK]    Clinical Course User Index [JK] KnaDorie RankD                              Medical Decision Making Problems Addressed: Altered mental status, unspecified altered mental status type: acute illness or injury that poses a threat to life or bodily functions Crohn's disease with complication, unspecified gastrointestinal tract [facility] or injury that poses a threat to life or bodily functions Small bowel obstruction [facility] or injury that poses a threat to life or bodily functions  Amount and/or Complexity of Data Reviewed Labs: ordered. Radiology: ordered.  Risk Prescription drug management. Decision regarding hospitalization.   Patient presented to the ED  for evaluation of acute abdominal pain.  On arrival patient was altered and obtunded.  He is not answering any questions and actively vomiting.  Patient was intubated primarily for airway protection considering his altered mental status and the vomiting.  Differential at that time included acute abdominal catastrophe, cerebral hemorrhage, acute stroke.  She was intubated without difficulty.  An NG tube was placed and there was large volume output.  CT scan of the head did not show any signs of cerebral hemorrhage or any acute abnormality.  Patient's abdominal CT scan showed evidence of an obstruction.  Most likely related to his Crohn's disease.  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  He was also having evidence of Crohn's ileitis.  I consulted with general surgery and pulmonary critical care, Dr. Redmond Pulling and Dr. Patsey Berthold respectively.  I have also placed a consult to gastroenterology.  Patient will be admitted to the hospital for further treatment.        Final Clinical Impression(s) / ED Diagnoses Final diagnoses:  Small bowel obstruction ([place])  Altered mental status, unspecified altered mental status type  Crohn's disease with complication, unspecified gastrointestinal tract location [facility])    Rx / DC Orders ED Discharge Orders     None          Dorie Rank, MD 12/24/22 2311    Dorie Rank, MD 12/25/22 0009

## 2022-12-24 NOTE — ED Triage Notes (Signed)
PT here via [facility] from home for acute onset generalized abdominal pain.  Significant hx of Chron's, abdominal fistula, etc.  Pt stated "it hurts all over" and has only been crying out in pain since arrival and not answering any questions.  Per [facility] VS stable.

## 2022-12-24 NOTE — Progress Notes (Signed)
ETT pulled back per MD verbal order.  ETT secured at 23 at the lips.

## 2022-12-24 NOTE — H&P (Incomplete)
NAME:  [name], MRN:  [id number], DOB:  28-Oct-1956, LOS: 0 ADMISSION DATE:  12/24/2022, CONSULTATION DATE:  2/11 REFERRING MD:  Dr. Tomi Bamberger, CHIEF COMPLAINT:  abdominal pain   History of Present Illness:  Patient is a 67 year old male with pertinent PMH Crohn's disease with ileostomy in place, kidney stones, DMT2, seizures presents to [facility] ED on 3/11 with severe abdominal pain.  On 2/11 patient presented to [facility] ED with AMS, vomiting and abdominal distention.  Patient complaining of acute onset diffuse abdominal pain.  Upon arrival to ED patient moaning and crying in pain and not answering any questions.  Patient not able to lay still and altered.  BP 160/97 and afebrile.  Respiratory rate 20s.  Lipase 31.  UA unremarkable.  Patient intubated for diagnostics.  CT head no acute abnormality.  CTA chest/abdomen/pelvis trace pleural effusions with bilateral posterior basilar opacity possible atelectasis versus pneumonia; bilateral thyroid nodules; bilateral nephrolithiasis with chronic pyelectasis L greater than R but no ureteral stone/ureteral dilatation; increased dilatation of multiple small bowel segments consistent with small bowel obstruction probably related to distal ileitis.  PCCM consulted for ICU admission.  Pertinent ED labs: Glucose 192, WBC 13.8  Pertinent  Medical History   Past Medical History:  Diagnosis Date   Abdominal wall abscess 01/03/2017   Arthritis    Bladder stone    Cancer ([place])    mycosis fungoides   Crohn's disease ([place])    Crohn's disease with abscess ([place]) 07/23/2015   Diabetes mellitus without complication (HCC)    History of aseptic necrosis of bone BILATERAL HIPS   S/P BONE GRAFT   History of kidney stones    History of mycosis fungoides    S/P ileostomy ([place])      Significant Hospital Events: Including procedures, antibiotic start and stop dates in addition to other pertinent events     Interim History / Subjective:  ***  Objective   Blood  pressure 130/79, pulse 80, temperature (!) 96.6 F (35.9 C), resp. rate 16, SpO2 100 %.    Vent Mode: PRVC FiO2 (%):  [40 %-100 %] 40 % Set Rate:  [16 bmp] 16 bmp Vt Set:  [510 mL] 510 mL PEEP:  [5 cmH20] 5 cmH20 Plateau Pressure:  [15 cmH20] 15 cmH20   Intake/Output Summary (Last 24 hours) at 12/24/2022 2252 Last data filed at 12/24/2022 2128 Gross per 24 hour  Intake 1058.81 ml  Output 300 ml  Net 758.81 ml   There were no vitals filed for this visit.  Examination: General: *** HENT: *** Lungs: *** Cardiovascular: *** Abdomen: *** Extremities: *** Neuro: *** GU: ***  Resolved Hospital Problem list   ***  Assessment & Plan:    Bilateral thyroid nodules: Incidental finding on CT largest on left measuring 4 x 2.7 x 4.6 cm  Best Practice (right click and "Reselect all SmartList Selections" daily)   Diet/type: {diet type:25684} DVT prophylaxis: {anticoagulation (Optional):25687} GI prophylaxis: [id number] Lines: {Central Venous Access:25771} Foley:  {Central Venous Access:25691} Code Status:  {Code Status:26939} Last date of multidisciplinary goals of care discussion [***]  Labs   CBC: Recent Labs  Lab 12/19/22 1821 12/24/22 1947 12/24/22 2010 12/24/22 2116  WBC 7.1 13.8*  --   --   NEUTROABS 5.1  --   --   --   HGB 11.4* 14.1 15.6 10.9*  HCT 36.2* 44.6 46.0 32.0*  MCV 84.0 83.4  --   --   PLT 143* 208  --   --  Basic Metabolic Panel: Recent Labs  Lab 12/19/22 2233 12/24/22 1947 12/24/22 2010 12/24/22 2116  NA 141 139 140 138  K 2.8* 4.0 4.0 3.7  CL 109 102 106  --   CO2 21* 25  --   --   GLUCOSE 146* 192* 188*  --   BUN 18 11 11  $ --   CREATININE 0.97 1.03 0.90  --   CALCIUM 8.8* 9.4  --   --    GFR: Estimated Creatinine Clearance: 71.9 mL/min (by C-G formula based on SCr of 0.9 mg/dL). Recent Labs  Lab 12/19/22 1821 12/24/22 1947  WBC 7.1 13.8*    Liver Function Tests: Recent Labs  Lab 12/19/22 2233 12/24/22 1947  AST 17 19   ALT 11 11  ALKPHOS 41 57  BILITOT 0.4 0.8  PROT 5.4* 6.8  ALBUMIN 3.1* 3.7   Recent Labs  Lab 12/24/22 1947  LIPASE 31   No results for input(s): "AMMONIA" in the last 168 hours.  ABG    Component Value Date/Time   PHART 7.396 12/24/2022 2116   PCO2ART 42.7 12/24/2022 2116   PO2ART 545 (H) 12/24/2022 2116   HCO3 26.2 12/24/2022 2116   TCO2 27 12/24/2022 2116   O2SAT 100 12/24/2022 2116     Coagulation Profile: No results for input(s): "INR", "PROTIME" in the last 168 hours.  Cardiac Enzymes: No results for input(s): "CKTOTAL", "CKMB", "CKMBINDEX", "TROPONINI" in the last 168 hours.  HbA1C: Hgb A1c MFr Bld  Date/Time Value Ref Range Status  07/31/2022 11:06 AM 6.3 (H) 4.8 - 5.6 % Final    Comment:    (NOTE) Pre diabetes:          5.7%-6.4%  Diabetes:              >6.4%  Glycemic control for   <7.0% adults with diabetes   01/11/2017 04:15 AM 7.4 (H) 4.8 - 5.6 % Final    Comment:    (NOTE)         Pre-diabetes: 5.7 - 6.4         Diabetes: >6.4         Glycemic control for adults with diabetes: <7.0     CBG: Recent Labs  Lab 12/19/22 1841  GLUCAP 148*    Review of Systems:   ***  Past Medical History:  He,  has a past medical history of Abdominal wall abscess (01/03/2017), Arthritis, Bladder stone, Cancer ([place]), Crohn's disease ([place]), Crohn's disease with abscess ([place]) (07/23/2015), Diabetes mellitus without complication ([place]), History of aseptic necrosis of bone (BILATERAL HIPS), History of kidney stones, History of mycosis fungoides, and S/P ileostomy ([place]).   Surgical History:   Past Surgical History:  Procedure Laterality Date   BONE GRAFT OF LEFT HIP  11/13/1984   ASEPTIC NECROSIS   COLONOSCOPY     CYSTO/ BILATERAL RETROGRADE PYELOGRAM/ LEFT URETERAL STONE EXTRACTION / BILATERAL STENT PLACEMENT  10/07/2003   CYSTOSCOPY WITH LITHOLAPAXY  02/12/2012   Procedure: CYSTOSCOPY WITH LITHOLAPAXY;  Surgeon: Claybon Jabs, MD;  Location[facility];  Service: Urology;  Laterality: N/A;   EXPL. LAP. W/ ENTEROLYSIS, RESECTION INFLAMMATORY MASS RLQ / TAKE-DOWN OF FISTULA WITH ENTEROENTEROSTOMY/ RESECTION WITH THE SMALL BOWEL TO ASCENDING COLON ANASTOMOSIS  10/29/2000   CROHN'S DISEASE W/ ANASTOMOTIC INFLAMMATORY MASS/    10-30-2000 EXPL. LAP. CONTROL POST-OP ABD. BLEEDING   EXPLORATORY LAPAROTOMY/ RESECTION OF ILEOCOLONIC ANASTOMOSIS AND CREATION OF ILEOSTOMY  11/04/2000   ABD. PERFORATION   HARDWARE REMOVAL Left  08/07/2022   Procedure: HARDWARE REMOVAL;  Surgeon: Dorna Leitz, MD;  [facility];  Service: Orthopedics;  Laterality: Left;   [place] N/A 07/23/2015   Procedure: Takedown ileostomy and repair of ostomy hernia, extensive entrolysis (2.5 hrs), ileostransverse colon anastomosis; closure of massive ventral hernia with 20 x 30 Strattice mesh;  Surgeon: Johnathan Hausen, MD;  [facility];  Service: General;  Laterality: N/A;   INCISIONAL HERNIA REPAIR  07/23/2015   Strattice mesh   IRRIGATION AND DEBRIDEMENT ABSCESS Right 09/09/2014   Procedure: MINOR INCISION AND DRAINAGE OF ABSCESS;  Surgeon: Charlotte Crumb, MD;  Location: [place];  Service: Orthopedics;  Laterality: Right;  incision and drainage right long finger    IRRIGATION AND DEBRIDEMENT ABSCESS N/A 01/03/2017   Procedure: IRRIGATION AND DEBRIDEMENT ABDOMINAL WALL ABSCESS;  Surgeon: Johnathan Hausen, MD;  [facility];  Service: General;  Laterality: N/A;   LAPAROSCOPY N/A 07/23/2015   Procedure: LAPAROSCOPY DIAGNOSTIC;  Surgeon: Johnathan Hausen, MD;  [facility];  Service: General;  Laterality: N/A;   ORIF ANKLE FRACTURE Left 08/27/2014   Procedure:  open reduction internal fixation left ankle;  Surgeon: Nita Sells, MD;  Location: [place];  Service: Orthopedics;  Laterality: Left;  Left open reduction internal fixation ankle   RIGHT URETEROSCOPIC STONE EXTRACITON   04-24-2005  & 12-01-2002   TOTAL HIP ARTHROPLASTY Left 08/07/2022   Procedure: TOTAL HIP ARTHROPLASTY ANTERIOR APPROACH;  Surgeon: Dorna Leitz, MD;  [facility];  Service: Orthopedics;  Laterality: Left;     Social History:   reports that he quit smoking about 28 years ago. His smoking use included cigarettes. He has never used smokeless tobacco. He reports that he does not drink alcohol and does not use drugs.   Family History:  His family history includes Alzheimer's disease in his father; Cancer in his father and sister.   Allergies Allergies  Allergen Reactions   Nsaids Other (See Comments)    Crohn's disease, told to avoid NSAIDs     Home Medications  Prior to Admission medications   Medication Sig Start Date End Date Taking? Authorizing Provider  aspirin 81 MG chewable tablet Chew 1 tablet (81 mg total) by mouth daily. 12/02/22   [name], MD  atorvastatin (LIPITOR) 40 MG tablet Take 1 tablet (40 mg total) by mouth daily. 12/02/22   [name], MD  Cholecalciferol (VITAMIN D-3 PO) Take 1 capsule by mouth in the morning.    [provider]  Cyanocobalamin (VITAMIN B-12 PO) Take 1 tablet by mouth in the morning.    [provider]  diazePAM (VALTOCO 10 MG DOSE) 10 MG/0.1ML LIQD Place 10 mg into the nose as needed (seizure lasting greater than 5 minutes). 12/02/22   [name], MD  HUMIRA, 2 PEN, 40 MG/0.4ML PNKT Inject 40 mg into the skin every 14 (fourteen) days. 11/17/22   [provider]  levETIRAcetam (KEPPRA) 750 MG tablet Take 1 tablet (750 mg total) by mouth 2 (two) times daily. 12/21/22   Marcial Pacas, MD  mesalamine (LIALDA) 1.2 g EC tablet Take 2.4 g by mouth 2 (two) times daily. 07/07/22   [provider]  metFORMIN (GLUCOPHAGE) 500 MG tablet Take 500 mg by mouth 2 (two) times daily. 05/23/21   [provider]  potassium chloride SA (KLOR-CON M) 20 MEQ tablet Take 2 tablets (40 mEq total) by mouth daily.  12/20/22  Horton, Barbette Hair, MD  potassium citrate (UROCIT-K) 10 MEQ (1080 MG) SR tablet Take 40 mEq by mouth 4 (four) times daily.    [provider]  tadalafil (CIALIS) 20 MG tablet Take 20 mg by mouth daily as needed for erectile dysfunction. 05/04/21   [provider]  tamsulosin (FLOMAX) 0.4 MG CAPS capsule Take 1 capsule (0.4 mg total) by mouth daily after supper. Patient taking differently: Take 0.4 mg by mouth in the morning. 01/12/17   Earnstine Regal, PA-C  Trospium Chloride 60 MG CP24 Take 60 mg by mouth in the morning. 06/04/22   [provider]     Critical care time: ***    Ethelene [facility] 12/24/2022, 10:52 PM  Please see [email or web address] for pager details.  From 7A-7P if no response, please call [phone]. After hours, please call ELink [phone].

## 2022-12-24 NOTE — Progress Notes (Signed)
Pt transported from ED29 to CT and back with no complications.

## 2022-12-24 NOTE — Progress Notes (Signed)
Pharmacy Antibiotic Note  [name] is a 67 y.o. male admitted on 12/24/2022 with  intra-abdominal infection .  Pharmacy has been consulted for Zosyn dosing. History of Crohn's. WBC 13.8. CT with SBO.   Plan: Zosyn 3.375G IV q8h to be infused over 4 hours   Temp (24hrs), Avg:97.3 F (36.3 C), Min:96.6 F (35.9 C), Max:98.2 F (36.8 C)  Recent Labs  Lab 12/19/22 1821 12/19/22 2233 12/24/22 1947 12/24/22 2010  WBC 7.1  --  13.8*  --   CREATININE  --  0.97 1.03 0.90    Estimated Creatinine Clearance: 71.9 mL/min (by C-G formula based on SCr of 0.9 mg/dL).    Allergies  Allergen Reactions   Nsaids Other (See Comments)    Crohn's disease, told to avoid NSAIDs    Narda Bonds, PharmD, BCPS Clinical Pharmacist Phone: [phone]

## 2022-12-24 NOTE — H&P (Incomplete)
NAME:  NIKAI SCHREYER, MRN:  [id number], DOB:  02/02/1956, LOS: 0 ADMISSION DATE:  12/24/2022, CONSULTATION DATE:  2/11 REFERRING MD:  Dr. Tomi Bamberger, CHIEF COMPLAINT:  abdominal pain   History of Present Illness:  Patient is a 67 year old male with pertinent PMH Crohn's disease with ileostomy in place, kidney stones, DMT2, recent seizures on keppra presents to [facility] ED on 3/11 with severe abdominal pain.  On 2/11 patient presented to [facility] ED with AMS, vomiting and abdominal distention.  Patient complaining of acute onset diffuse abdominal pain.  Upon arrival to ED patient moaning and crying in pain and not answering any questions.  Patient not able to lay still and altered.  BP 160/97 and afebrile.  Respiratory rate 20s.  Lipase 31.  UA unremarkable.  Patient intubated for diagnostics.  CT head no acute abnormality.  CTA chest/abdomen/pelvis trace pleural effusions with bilateral posterior basilar opacity possible atelectasis versus pneumonia; bilateral thyroid nodules; bilateral nephrolithiasis with chronic pyelectasis L greater than R but no ureteral stone/ureteral dilatation; increased dilatation of multiple small bowel segments consistent with small bowel obstruction probably related to distal ileitis.  GI and surgery consulted. PCCM consulted for ICU admission.  Pertinent ED labs: Glucose 192, WBC 13.8  Pertinent  Medical History   Past Medical History:  Diagnosis Date  . Abdominal wall abscess 01/03/2017  . Arthritis   . Bladder stone   . 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 Crohn's disease ([place])   . Crohn's disease with abscess ([place]) 07/23/2015  . Diabetes mellitus without complication ([place])   . History of aseptic necrosis of bone BILATERAL HIPS   S/P BONE GRAFT  . History of kidney stones   . History of mycosis fungoides   . S/P ileostomy ([place])      Significant Hospital Events: Including procedures, antibiotic start and stop dates in addition to other pertinent events   2/11  intubated w/ AMS and severe abdominal pain  Interim History / Subjective:  See above  Objective   Blood pressure 130/79, pulse 80, temperature (!) 96.6 F (35.9 C), resp. rate 16, SpO2 100 %.    Vent Mode: PRVC FiO2 (%):  [40 %-100 %] 40 % Set Rate:  [16 bmp] 16 bmp Vt Set:  [510 mL] 510 mL PEEP:  [5 cmH20] 5 cmH20 Plateau Pressure:  [15 cmH20] 15 cmH20   Intake/Output Summary (Last 24 hours) at 12/24/2022 2252 Last data filed at 12/24/2022 2128 Gross per 24 hour  Intake 1058.81 ml  Output 300 ml  Net 758.81 ml   There were no vitals filed for this visit.  Examination: General:  critically ill appearing on mech vent HEENT: MM pink/moist; ETT in place Neuro: sedated; cough/gag present; pupils 49m sluggish b/l CV: s1s2, tachy 110s, no m/r/g PULM:  dim clear BS bilaterally; on mech vent PRVC GI: soft, Ileostomy present with drainage/erythema; distant bowel sounds Extremities: warm/dry, no edema  Skin: no rashes or lesions appreciated   Resolved Hospital Problem list     Assessment & Plan:  Severe abdominal pain N/V -CTA abdomen/pelvis bilateral nephrolithiasis with chronic pyelectasis L greater than R but no ureteral stone/ureteral dilatation; increased dilatation of multiple small bowel segments consistent with small bowel obstruction probably related to distal ileitis. 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 Ileostomy in place. Seen by [facility]. Plan: -Surgery and GI  consulted; appreciate recs -N.p.o. with OG on low intermittent suction -Bowel rest -Consider IV steroid burst -Continue ileostomy care -will start on zosyn  -IV fluids -trend LA -prn zofran -hold humira and mesalamine for now  Acute respiratory failure: Intubated for AMS and need for diagnostics Possible posterior basilar  atelectasis versus pneumonia Plan: -LTVV strategy with tidal volumes of 6-8 cc/kg ideal body weight -Wean PEEP/FiO2 for SpO2 >92% -VAP bundle in place -Daily SAT and SBT -PAD protocol in place -wean sedation for RASS goal 0 to -1 -cont zosyn as above; send tracheal aspirate  Acute encephalopathy: was awake moaning in severe pain but confused not following commands. Possible sepsis? Hx of seizures Plan: -CT head negative -check keppra level; continue keppra via IV -consider EEG -check UDS, ethanol, ammonia -limit sedating meds -sedation for RASS 0 to -1  DMT2 Plan: -check A1c -ssi and cbg monitoring  HLD Plan: -hold home statin and asa while npo  Bilateral thyroid nodules: Incidental finding on CT largest on left measuring 4 x 2.7 x 4.6 cm Plan: -f/u outpt  Hx of BPH Plan: -hold flomax  Best Practice (right click and "Reselect all SmartList Selections" daily)   Diet/type: NPO DVT prophylaxis: LMWH GI prophylaxis: PPI Lines: N/A Foley:  N/A Code Status:  full code Last date of multidisciplinary goals of care discussion [2/11 spoke w/ wife Langley Gauss and updated over phone. Wants patient to remain full code for now.]  Labs   CBC: Recent Labs  Lab 12/19/22 1821 12/24/22 1947 12/24/22 2010 12/24/22 2116  WBC 7.1 13.8*  --   --   NEUTROABS 5.1  --   --   --   HGB 11.4* 14.1 15.6 10.9*  HCT 36.2* 44.6 46.0 32.0*  MCV 84.0 83.4  --   --   PLT 143* 208  --   --     Basic Metabolic Panel: Recent Labs  Lab 12/19/22 2233 12/24/22 1947 12/24/22 2010 12/24/22 2116  NA 141 139 140 138  K 2.8* 4.0 4.0 3.7  CL 109 102 106  --   CO2 21* 25  --   --   GLUCOSE 146* 192* 188*  --   BUN 18 11 11  $ --   CREATININE 0.97 1.03 0.90  --   CALCIUM 8.8* 9.4  --   --    GFR: Estimated Creatinine Clearance: 71.9 mL/min (by C-G formula based on SCr of 0.9 mg/dL). Recent Labs  Lab 12/19/22 1821 12/24/22 1947  WBC 7.1 13.8*    Liver Function Tests: Recent Labs  Lab  12/19/22 2233 12/24/22 1947  AST 17 19  ALT 11 11  ALKPHOS 41 57  BILITOT 0.4 0.8  PROT 5.4* 6.8  ALBUMIN 3.1* 3.7   Recent Labs  Lab 12/24/22 1947  LIPASE 31   No results for input(s): "AMMONIA" in the last 168 hours.  ABG    Component Value Date/Time   PHART 7.396 12/24/2022 2116   PCO2ART 42.7 12/24/2022 2116   PO2ART 545 (H) 12/24/2022 2116   HCO3 26.2 12/24/2022 2116   TCO2 27 12/24/2022 2116   O2SAT 100 12/24/2022 2116     Coagulation Profile: No results for input(s): "INR", "PROTIME" in the last 168 hours.  Cardiac Enzymes: No results for input(s): "CKTOTAL", "CKMB", "CKMBINDEX", "TROPONINI" in the last 168 hours.  HbA1C: Hgb A1c MFr Bld  Date/Time Value Ref Range Status  07/31/2022 11:06 AM 6.3 (H) 4.8 - 5.6 % Final    Comment:    (NOTE) Pre  diabetes:          5.7%-6.4%  Diabetes:              >6.4%  Glycemic control for   <7.0% adults with diabetes   01/11/2017 04:15 AM 7.4 (H) 4.8 - 5.6 % Final    Comment:    (NOTE)         Pre-diabetes: 5.7 - 6.4         Diabetes: >6.4         Glycemic control for adults with diabetes: <7.0     CBG: Recent Labs  Lab 12/19/22 1841  GLUCAP 148*    Review of Systems:   Patient is encephalopathic and/or intubated. Therefore history has been obtained from chart review.    Past Medical History:  He,  has a past medical history of Abdominal wall abscess (01/03/2017), Arthritis, Bladder stone, Cancer ([place]), Crohn's disease ([place]), Crohn's disease with abscess ([place]) (07/23/2015), Diabetes mellitus without complication ([place]), History of aseptic necrosis of bone (BILATERAL HIPS), History of kidney stones, History of mycosis fungoides, and S/P ileostomy ([place]).   Surgical History:   Past Surgical History:  Procedure Laterality Date  . BONE GRAFT OF LEFT HIP  11/13/1984   ASEPTIC NECROSIS  . COLONOSCOPY    . CYSTO/ BILATERAL RETROGRADE PYELOGRAM/ LEFT URETERAL STONE EXTRACTION / BILATERAL STENT PLACEMENT   10/07/2003  . CYSTOSCOPY WITH LITHOLAPAXY  02/12/2012   Procedure: CYSTOSCOPY WITH LITHOLAPAXY;  Surgeon: Claybon Jabs, MD;  Location: [facility];  Service: Urology;  Laterality: N/A;  . EXPL. LAP. W/ ENTEROLYSIS, RESECTION INFLAMMATORY MASS RLQ / TAKE-DOWN OF FISTULA WITH ENTEROENTEROSTOMY/ RESECTION WITH THE SMALL BOWEL TO ASCENDING COLON ANASTOMOSIS  10/29/2000   CROHN'S DISEASE W/ ANASTOMOTIC INFLAMMATORY MASS/    10-30-2000 EXPL. LAP. CONTROL POST-OP ABD. BLEEDING  . EXPLORATORY LAPAROTOMY/ RESECTION OF ILEOCOLONIC ANASTOMOSIS AND CREATION OF ILEOSTOMY  11/04/2000   ABD. PERFORATION  . HARDWARE REMOVAL Left 08/07/2022   Procedure: HARDWARE REMOVAL;  Surgeon: Dorna Leitz, MD;  [facility];  Service: Orthopedics;  Laterality: Left;  . [place]  . ILEOSTOMY CLOSURE N/A 07/23/2015   Procedure: Takedown ileostomy and repair of ostomy hernia, extensive entrolysis (2.5 hrs), ileostransverse colon anastomosis; closure of massive ventral hernia with 20 x 30 Strattice mesh;  Surgeon: Johnathan Hausen, MD;  [facility];  Service: General;  Laterality: N/A;  . INCISIONAL HERNIA REPAIR  07/23/2015   Strattice mesh  . IRRIGATION AND DEBRIDEMENT ABSCESS Right 09/09/2014   Procedure: MINOR INCISION AND DRAINAGE OF ABSCESS;  Surgeon: Charlotte Crumb, MD;  Location: [place];  Service: Orthopedics;  Laterality: Right;  incision and drainage right long finger   . IRRIGATION AND DEBRIDEMENT ABSCESS N/A 01/03/2017   Procedure: IRRIGATION AND DEBRIDEMENT ABDOMINAL WALL ABSCESS;  Surgeon: Johnathan Hausen, MD;  [facility];  Service: General;  Laterality: N/A;  . LAPAROSCOPY N/A 07/23/2015   Procedure: LAPAROSCOPY DIAGNOSTIC;  Surgeon: Johnathan Hausen, MD;  [facility];  Service: General;  Laterality: N/A;  . ORIF ANKLE FRACTURE Left 08/27/2014   Procedure:  open reduction internal fixation left ankle;  Surgeon: Nita Sells, MD;  Location: [place];  Service: Orthopedics;  Laterality: Left;  Left open reduction internal fixation ankle  . RIGHT URETEROSCOPIC STONE EXTRACITON  04-24-2005  & 12-01-2002  . TOTAL HIP ARTHROPLASTY Left 08/07/2022   Procedure: TOTAL HIP ARTHROPLASTY ANTERIOR APPROACH;  Surgeon: Dorna Leitz, MD;  Location[facility];  Service: Orthopedics;  Laterality: Left;     Social History:   reports that he quit smoking about 28 years ago. His smoking use included cigarettes. He has never used smokeless tobacco. He reports that he does not drink alcohol and does not use drugs.   Family History:  His family history includes Alzheimer's disease in his father; Cancer in his father and sister.   Allergies Allergies  Allergen Reactions  . Nsaids Other (See Comments)    Crohn's disease, told to avoid NSAIDs     Home Medications  Prior to Admission medications   Medication Sig Start Date End Date Taking? Authorizing Provider  aspirin 81 MG chewable tablet Chew 1 tablet (81 mg total) by mouth daily. 12/02/22   [name], MD  atorvastatin (LIPITOR) 40 MG tablet Take 1 tablet (40 mg total) by mouth daily. 12/02/22   [name], MD  Cholecalciferol (VITAMIN D-3 PO) Take 1 capsule by mouth in the morning.    [provider]  Cyanocobalamin (VITAMIN B-12 PO) Take 1 tablet by mouth in the morning.    [provider]  diazePAM (VALTOCO 10 MG DOSE) 10 MG/0.1ML LIQD Place 10 mg into the nose as needed (seizure lasting greater than 5 minutes). 12/02/22   [name], MD  HUMIRA, 2 PEN, 40 MG/0.4ML PNKT Inject 40 mg into the skin every 14 (fourteen) days. 11/17/22   [provider]  levETIRAcetam (KEPPRA) 750 MG tablet Take 1 tablet (750 mg total) by mouth 2 (two) times daily. 12/21/22   Marcial Pacas, MD  mesalamine (LIALDA) 1.2 g EC tablet Take 2.4 g by mouth 2 (two) times daily. 07/07/22   [provider]  metFORMIN (GLUCOPHAGE) 500 MG tablet Take  500 mg by mouth 2 (two) times daily. 05/23/21   [provider]  potassium chloride SA (KLOR-CON M) 20 MEQ tablet Take 2 tablets (40 mEq total) by mouth daily. 12/20/22   Horton, Barbette Hair, MD  potassium citrate (UROCIT-K) 10 MEQ (1080 MG) SR tablet Take 40 mEq by mouth 4 (four) times daily.    [provider]  tadalafil (CIALIS) 20 MG tablet Take 20 mg by mouth daily as needed for erectile dysfunction. 05/04/21   [provider]  tamsulosin (FLOMAX) 0.4 MG CAPS capsule Take 1 capsule (0.4 mg total) by mouth daily after supper. Patient taking differently: Take 0.4 mg by mouth in the morning. 01/12/17   Earnstine Regal, PA-C  Trospium Chloride 60 MG CP24 Take 60 mg by mouth in the morning. 06/04/22   [provider]     Critical care time: 45 minutes    JD Rexene Agent [facility] 12/24/2022, 10:52 PM  Please see [email or web address] for pager details.  From 7A-7P if no response, please call [phone]. After hours, please call ELink [phone].

## 2022-12-25 ENCOUNTER — Inpatient Hospital Stay (HOSPITAL_COMMUNITY): Payer: 59

## 2022-12-25 DIAGNOSIS — K56609 Unspecified intestinal obstruction, unspecified as to partial versus complete obstruction: Principal | ICD-10-CM

## 2022-12-25 DIAGNOSIS — R4182 Altered mental status, unspecified: Secondary | ICD-10-CM

## 2022-12-25 LAB — CBC
HCT: 39.7 % (ref 39.0–52.0)
HCT: 41.8 % (ref 39.0–52.0)
Hemoglobin: 12.6 g/dL — ABNORMAL LOW (ref 13.0–17.0)
Hemoglobin: 13.1 g/dL (ref 13.0–17.0)
MCH: 26.5 pg (ref 26.0–34.0)
MCH: 26.6 pg (ref 26.0–34.0)
MCHC: 31.3 g/dL (ref 30.0–36.0)
MCHC: 31.7 g/dL (ref 30.0–36.0)
MCV: 83.4 fL (ref 80.0–100.0)
MCV: 85 fL (ref 80.0–100.0)
Platelets: 171 10*3/uL (ref 150–400)
Platelets: 187 10*3/uL (ref 150–400)
RBC: 4.76 MIL/uL (ref 4.22–5.81)
RBC: 4.92 MIL/uL (ref 4.22–5.81)
RDW: 15.7 % — ABNORMAL HIGH (ref 11.5–15.5)
RDW: 15.7 % — ABNORMAL HIGH (ref 11.5–15.5)
WBC: 11.5 10*3/uL — ABNORMAL HIGH (ref 4.0–10.5)
WBC: 8.5 10*3/uL (ref 4.0–10.5)
nRBC: 0 % (ref 0.0–0.2)
nRBC: 0 % (ref 0.0–0.2)

## 2022-12-25 LAB — TRIGLYCERIDES: Triglycerides: 299 mg/dL — ABNORMAL HIGH (ref <150)

## 2022-12-25 LAB — GLUCOSE, CAPILLARY
Glucose-Capillary: 130 mg/dL — ABNORMAL HIGH (ref 70–99)
Glucose-Capillary: 166 mg/dL — ABNORMAL HIGH (ref 70–99)
Glucose-Capillary: 158 mg/dL — ABNORMAL HIGH (ref 70–99)
Glucose-Capillary: 123 mg/dL — ABNORMAL HIGH (ref 70–99)
Glucose-Capillary: 155 mg/dL — ABNORMAL HIGH (ref 70–99)
Glucose-Capillary: 132 mg/dL — ABNORMAL HIGH (ref 70–99)

## 2022-12-25 LAB — RESP PANEL BY RT-PCR (RSV, FLU A&B, COVID)  RVPGX2
Influenza A by PCR: NEGATIVE
Influenza B by PCR: NEGATIVE
Resp Syncytial Virus by PCR: NEGATIVE
SARS Coronavirus 2 by RT PCR: NEGATIVE

## 2022-12-25 LAB — HEMOGLOBIN A1C
Hgb A1c MFr Bld: 7.2 % — ABNORMAL HIGH (ref 4.8–5.6)
Mean Plasma Glucose: 159.94 mg/dL

## 2022-12-25 LAB — MAGNESIUM: Magnesium: 1.6 mg/dL — ABNORMAL LOW (ref 1.7–2.4)

## 2022-12-25 LAB — CREATININE, SERUM
Creatinine, Ser: 1.09 mg/dL (ref 0.61–1.24)
GFR, Estimated: >60 mL/min (ref >60)

## 2022-12-25 LAB — BASIC METABOLIC PANEL
Anion gap: 11 (ref 5–15)
BUN: 10 mg/dL (ref 8–23)
CO2: 25 mmol/L (ref 22–32)
Calcium: 8.6 mg/dL — ABNORMAL LOW (ref 8.9–10.3)
Chloride: 104 mmol/L (ref 98–111)
Creatinine, Ser: 1.04 mg/dL (ref 0.61–1.24)
GFR, Estimated: >60 mL/min (ref >60)
Glucose, Bld: 152 mg/dL — ABNORMAL HIGH (ref 70–99)
Potassium: 4.3 mmol/L (ref 3.5–5.1)
Sodium: 140 mmol/L (ref 135–145)

## 2022-12-25 LAB — CBG MONITORING, ED: Glucose-Capillary: 130 mg/dL — ABNORMAL HIGH (ref 70–99)

## 2022-12-25 LAB — LACTIC ACID, PLASMA
Lactic Acid, Venous: 2.5 mmol/L (ref 0.5–1.9)
Lactic Acid, Venous: 2.9 mmol/L (ref 0.5–1.9)

## 2022-12-25 LAB — PROCALCITONIN: Procalcitonin: <0.1 ng/mL

## 2022-12-25 LAB — ETHANOL: Alcohol, Ethyl (B): <10 mg/dL (ref <10)

## 2022-12-25 LAB — MRSA NEXT GEN BY PCR, NASAL: MRSA by PCR Next Gen: NOT DETECTED

## 2022-12-25 LAB — AMMONIA: Ammonia: 24 umol/L (ref 9–35)

## 2022-12-25 MED ORDER — MAGNESIUM SULFATE 2 GM/50ML IV SOLN
2.0000 g | Freq: Once | INTRAVENOUS | Status: AC
  Administered 2022-12-25: 2 g via INTRAVENOUS
  Filled 2022-12-25: qty 50

## 2022-12-25 MED ORDER — ORAL CARE MOUTH RINSE: 15.0000 mL | OROMUCOSAL | Status: DC | PRN

## 2022-12-25 MED ORDER — METHYLPREDNISOLONE SODIUM SUCC 40 MG IJ SOLR
40.0000 mg | Freq: Once | INTRAMUSCULAR | Status: AC
  Administered 2022-12-25: 40 mg via INTRAVENOUS
  Filled 2022-12-25: qty 1

## 2022-12-25 MED ORDER — ORAL CARE MOUTH RINSE
15.0000 mL | OROMUCOSAL | Status: DC
  Administered 2022-12-25 – 2022-12-26 (×15): 15 mL via OROMUCOSAL

## 2022-12-25 MED ORDER — METHYLPREDNISOLONE SODIUM SUCC 125 MG IJ SOLR
80.0000 mg | Freq: Every day | INTRAMUSCULAR | Status: DC
  Administered 2022-12-25 – 2022-12-27 (×3): 80 mg via INTRAVENOUS
  Filled 2022-12-25 (×3): qty 2

## 2022-12-25 MED ORDER — CHLORHEXIDINE GLUCONATE CLOTH 2 % EX PADS
6.0000 | MEDICATED_PAD | Freq: Every day | CUTANEOUS | Status: DC
  Administered 2022-12-25: 6 via TOPICAL

## 2022-12-25 MED ORDER — LACTATED RINGERS IV BOLUS
500.0000 mL | Freq: Once | INTRAVENOUS | Status: AC
  Administered 2022-12-25: 500 mL via INTRAVENOUS

## 2022-12-25 NOTE — Plan of Care (Signed)
  Problem: Safety: Goal: Non-violent Restraint(s) Outcome: Completed/Met   Problem: Education: Goal: Ability to describe self-care measures that may prevent or decrease complications (Diabetes Survival Skills Education) will improve Outcome: Not Progressing Goal: Individualized Educational Video(s) Outcome: Not Progressing   Problem: Coping: Goal: Ability to adjust to condition or change in health will improve Outcome: Not Progressing   Problem: Fluid Volume: Goal: Ability to maintain a balanced intake and output will improve Outcome: Not Progressing   Problem: Health Behavior/Discharge Planning: Goal: Ability to identify and utilize available resources and services will improve Outcome: Not Progressing Goal: Ability to manage health-related needs will improve Outcome: Not Progressing   Problem: Metabolic: Goal: Ability to maintain appropriate glucose levels will improve Outcome: Not Progressing   Problem: Nutritional: Goal: Maintenance of adequate nutrition will improve Outcome: Not Progressing Goal: Progress toward achieving an optimal weight will improve Outcome: Not Progressing   Problem: Skin Integrity: Goal: Risk for impaired skin integrity will decrease Outcome: Not Progressing   Problem: Tissue Perfusion: Goal: Adequacy of tissue perfusion will improve Outcome: Not Progressing   Problem: Education: Goal: Knowledge of General Education information will improve Description: Including pain rating scale, medication(s)/side effects and non-pharmacologic comfort measures Outcome: Not Progressing   Problem: Health Behavior/Discharge Planning: Goal: Ability to manage health-related needs will improve Outcome: Not Progressing   Problem: Clinical Measurements: Goal: Ability to maintain clinical measurements within normal limits will improve Outcome: Not Progressing Goal: Will remain free from infection Outcome: Not Progressing Goal: Diagnostic test results will  improve Outcome: Not Progressing Goal: Respiratory complications will improve Outcome: Not Progressing Goal: Cardiovascular complication will be avoided Outcome: Not Progressing   Problem: Activity: Goal: Risk for activity intolerance will decrease Outcome: Not Progressing   Problem: Nutrition: Goal: Adequate nutrition will be maintained Outcome: Not Progressing   Problem: Coping: Goal: Level of anxiety will decrease Outcome: Not Progressing   Problem: Elimination: Goal: Will not experience complications related to bowel motility Outcome: Not Progressing Goal: Will not experience complications related to urinary retention Outcome: Not Progressing   Problem: Pain Managment: Goal: General experience of comfort will improve Outcome: Not Progressing   Problem: Safety: Goal: Ability to remain free from injury will improve Outcome: Not Progressing   Problem: Skin Integrity: Goal: Risk for impaired skin integrity will decrease Outcome: Not Progressing

## 2022-12-25 NOTE — Progress Notes (Signed)
eLink Physician-Brief Progress Note Patient Name: [name] DOB: Dec 15, 1955 MRN: [id number]   Date of Service  12/25/2022  HPI/Events of Note  Pt is brady, HR 55, 139/8 with noted hypothermia, a bear hugger has been applied, awaiting on labs to be drawn but he actually had a mag  1.6 from 2/11 at 11:48pm,  eICU Interventions  Magnesium 1 g IVPB ordered     Intervention Category Intermediate Interventions: Electrolyte abnormality - evaluation and management  Judd Lien 12/25/2022, 4:01 AM

## 2022-12-25 NOTE — Progress Notes (Addendum)
0045 patient came up from ED vented and sedated responds to pain, unable to complete admission question wife not here and patient unable to provide information  0230 warm blankets applied temp decreasing 500 ml bolus given for BP 0300 warming blanket applies temp 35.9 HR in 50s decreased fentanyl and prop 0614 bear hugger stopped temp 37

## 2022-12-25 NOTE — Progress Notes (Signed)
Transported pt. Via vent for ed to [facility] ICU

## 2022-12-25 NOTE — Consult Note (Signed)
Referring Provider: [facility] Primary Care Physician:  [name], MD Primary Gastroenterologist:  Dr. Alessandra Bevels  Reason for Consultation: Abdominal pain  HPI: [name] is a 67 y.o. male  pertinent PMH Crohn's disease with ileostomy in place, kidney stones, DMT2, recent seizures on keppra presents to [facility] ED on 3/11 with severe abdominal pain.   Patient originally presented to emergency department with altered mental status, vomiting, and abdominal distention.  Patient intubated for diagnostics.  CT head showed no acute abnormality.  CTA chest abdomen pelvis that showed possible atelectasis versus pneumonia, thyroid nodules, nephrolithiasis, increased dilation of multiple small bowel segments consistent with small bowel obstruction possibly related to distal ileus.  History of Crohn's disease currently on Humira starting 08/2022. Previously has been on remicade and mesalamine  Recently seen by [facility] in fall 2023 due to fistula in RLQ and the site of takedown of his ileostomy.  Dr. Hollie Salk believes that Crohn's would be benefit from biologic.  [facility] cauterized site and recommended biologic after patient underwent hip replacement (which is when patient was put on Humira)  Colonoscopy May 30, 2022 showed evidence of prior end-to-end ileocolonic anastomosis in the transverse colon with anastomotic stenosis and ulceration.  Normal mucosa seen in the rest of the colon.  Biopsies from anastomosis showed active ileocolitis likely from Crohn's.  Random colon biopsies showed inactive colitis.  He was also found to have a 10 mm inflammatory polyp of the rectum.  In the 1980s patient had terminal ileal resection with lysis of adhesions and creation of a distal small bowel transverse colon anastomosis in 2001.  This involved resection and inflammatory mass in the day, fistula, and anastomosis of small bowel to ascending colon/transverse colon.  Several days later this involved an ileostomy  In 2016 he had problems  with ostomy and had this taken down with extensive lysis of adhesions for 2 and half hours with subsequent ileal transverse colon anastomosis, close R ventral hernia redo of ileostomy.  Subsequently abscess involving small bowel and percutaneous drainage.  Patient also had issues with ventral hernia closing and had to have irrigation and debridement procedures.    Past Medical History:  Diagnosis Date   Abdominal wall abscess 01/03/2017   Arthritis    Bladder stone    Cancer ([place])    mycosis fungoides   Crohn's disease ([place])    Crohn's disease with abscess ([place]) 07/23/2015   Diabetes mellitus without complication (HCC)    History of aseptic necrosis of bone BILATERAL HIPS   S/P BONE GRAFT   History of kidney stones    History of mycosis fungoides    S/P ileostomy ([place])     Past Surgical History:  Procedure Laterality Date   BONE GRAFT OF LEFT HIP  11/13/1984   ASEPTIC NECROSIS   COLONOSCOPY     CYSTO/ BILATERAL RETROGRADE PYELOGRAM/ LEFT URETERAL STONE EXTRACTION / BILATERAL STENT PLACEMENT  10/07/2003   CYSTOSCOPY WITH LITHOLAPAXY  02/12/2012   Procedure: CYSTOSCOPY WITH LITHOLAPAXY;  Surgeon: Claybon Jabs, MD;  Location: [place];  Service: Urology;  Laterality: N/A;   EXPL. LAP. W/ ENTEROLYSIS, RESECTION INFLAMMATORY MASS RLQ / TAKE-DOWN OF FISTULA WITH ENTEROENTEROSTOMY/ RESECTION WITH THE SMALL BOWEL TO ASCENDING COLON ANASTOMOSIS  10/29/2000   CROHN'S DISEASE W/ ANASTOMOTIC INFLAMMATORY MASS/    10-30-2000 EXPL. LAP. CONTROL POST-OP ABD. BLEEDING   EXPLORATORY LAPAROTOMY/ RESECTION OF ILEOCOLONIC ANASTOMOSIS AND CREATION OF ILEOSTOMY  11/04/2000   ABD. PERFORATION   HARDWARE REMOVAL Left 08/07/2022  Procedure: HARDWARE REMOVAL;  Surgeon: Dorna Leitz, MD;  Location: [facility];  Service: Orthopedics;  Laterality: Left;   [place] N/A 07/23/2015   Procedure: Takedown ileostomy and repair of ostomy  hernia, extensive entrolysis (2.5 hrs), ileostransverse colon anastomosis; closure of massive ventral hernia with 20 x 30 Strattice mesh;  Surgeon: Johnathan Hausen, MD;  Location: [facility];  Service: General;  Laterality: N/A;   INCISIONAL HERNIA REPAIR  07/23/2015   Strattice mesh   IRRIGATION AND DEBRIDEMENT ABSCESS Right 09/09/2014   Procedure: MINOR INCISION AND DRAINAGE OF ABSCESS;  Surgeon: Charlotte Crumb, MD;  Location: [place];  Service: Orthopedics;  Laterality: Right;  incision and drainage right long finger    IRRIGATION AND DEBRIDEMENT ABSCESS N/A 01/03/2017   Procedure: IRRIGATION AND DEBRIDEMENT ABDOMINAL WALL ABSCESS;  Surgeon: Johnathan Hausen, MD;  Location: [facility];  Service: General;  Laterality: N/A;   LAPAROSCOPY N/A 07/23/2015   Procedure: LAPAROSCOPY DIAGNOSTIC;  Surgeon: Johnathan Hausen, MD;  Location: [facility];  Service: General;  Laterality: N/A;   ORIF ANKLE FRACTURE Left 08/27/2014   Procedure:  open reduction internal fixation left ankle;  Surgeon: Nita Sells, MD;  Location: [place];  Service: Orthopedics;  Laterality: Left;  Left open reduction internal fixation ankle   RIGHT URETEROSCOPIC STONE EXTRACITON  04-24-2005  & 12-01-2002   TOTAL HIP ARTHROPLASTY Left 08/07/2022   Procedure: TOTAL HIP ARTHROPLASTY ANTERIOR APPROACH;  Surgeon: Dorna Leitz, MD;  Location: [facility];  Service: Orthopedics;  Laterality: Left;    Prior to Admission medications   Medication Sig Start Date End Date Taking? Authorizing Provider  aspirin 81 MG chewable tablet Chew 1 tablet (81 mg total) by mouth daily. 12/02/22   [name], MD  atorvastatin (LIPITOR) 40 MG tablet Take 1 tablet (40 mg total) by mouth daily. 12/02/22   [name], MD  Cholecalciferol (VITAMIN D-3 PO) Take 1 capsule by mouth in the morning.    [provider]  Cyanocobalamin (VITAMIN B-12 PO) Take 1 tablet by mouth in the morning.    [provider]  diazePAM  (VALTOCO 10 MG DOSE) 10 MG/0.1ML LIQD Place 10 mg into the nose as needed (seizure lasting greater than 5 minutes). 12/02/22   [name], MD  HUMIRA, 2 PEN, 40 MG/0.4ML PNKT Inject 40 mg into the skin every 14 (fourteen) days. 11/17/22   [provider]  levETIRAcetam (KEPPRA) 750 MG tablet Take 1 tablet (750 mg total) by mouth 2 (two) times daily. 12/21/22   Marcial Pacas, MD  mesalamine (LIALDA) 1.2 g EC tablet Take 2.4 g by mouth 2 (two) times daily. 07/07/22   [provider]  metFORMIN (GLUCOPHAGE) 500 MG tablet Take 500 mg by mouth 2 (two) times daily. 05/23/21   [provider]  potassium chloride SA (KLOR-CON M) 20 MEQ tablet Take 2 tablets (40 mEq total) by mouth daily. 12/20/22   Horton, Barbette Hair, MD  potassium citrate (UROCIT-K) 10 MEQ (1080 MG) SR tablet Take 40 mEq by mouth 4 (four) times daily.    [provider]  tadalafil (CIALIS) 20 MG tablet Take 20 mg by mouth daily as needed for erectile dysfunction. 05/04/21   [provider]  tamsulosin (FLOMAX) 0.4 MG CAPS capsule Take 1 capsule (0.4 mg total) by mouth daily after supper. Patient taking differently: Take 0.4 mg by mouth in the morning. 01/12/17   Earnstine Regal, PA-C  Trospium Chloride 60 MG CP24 Take 60 mg by mouth in the morning. 06/04/22   [provider]    Scheduled Meds:  Chlorhexidine Gluconate Cloth  6 each Topical Q0600   enoxaparin (LOVENOX) injection  40 mg Subcutaneous Daily   insulin aspart  0-9 Units Subcutaneous Q4H   methylPREDNISolone (SOLU-MEDROL) injection  80 mg Intravenous Daily   mouth rinse  15 mL Mouth Rinse Q2H   pantoprazole (PROTONIX) IV  40 mg Intravenous Daily   Continuous Infusions:  fentaNYL infusion INTRAVENOUS 125 mcg/hr (12/25/22 0500)   lactated ringers 100 mL/hr at 12/25/22 0500   levETIRAcetam Stopped (12/25/22 0330)   piperacillin-tazobactam (ZOSYN)  IV 3.375 g (12/25/22 0530)   propofol (DIPRIVAN) infusion 25 mcg/kg/min  (12/25/22 0623)   PRN Meds:.fentaNYL, ondansetron (ZOFRAN) IV, mouth rinse  Allergies as of 12/24/2022 - Review Complete 12/24/2022  Allergen Reaction Noted   Nsaids Other (See Comments) 09/10/2022    Family History  Problem Relation Age of Onset   Alzheimer's disease Father    Cancer Father    Cancer Sister     Social History   Socioeconomic History   Marital status: Married    Spouse name: Not on file   Number of children: 3   Years of education: Not on file   Highest education level: Not on file  Occupational History   Not on file  Tobacco Use   Smoking status: Former    Types: Cigarettes    Quit date: 08/26/1994    Years since quitting: 28.3   Smokeless tobacco: Never  Vaping Use   Vaping Use: Never used  Substance and Sexual Activity   Alcohol use: No   Drug use: No   Sexual activity: Never  Other Topics Concern   Not on file  Social History Narrative   Not on file   Social Determinants of Health   Financial Resource Strain: Not on file  Food Insecurity: No Food Insecurity (09/10/2022)   Hunger Vital Sign    Worried About Running Out of Food in the Last Year: Never true    Ran Out of Food in the Last Year: Never true  Transportation Needs: No Transportation Needs (09/10/2022)   PRAPARE - Hydrologist (Medical): No    Lack of Transportation (Non-Medical): No  Physical Activity: Not on file  Stress: Not on file  Social Connections: Not on file  Intimate Partner Violence: Not At Risk (09/10/2022)   Humiliation, Afraid, Rape, and Kick questionnaire    Fear of Current or Ex-Partner: No    Emotionally Abused: No    Physically Abused: No    Sexually Abused: No    Review of Systems: Review of Systems  Unable to perform ROS: Acuity of condition     Physical Exam:Physical Exam Constitutional:      Appearance: He is ill-appearing and toxic-appearing.     Comments: Intubated. Responds to verbal stimuli  HENT:     Head:  Normocephalic.  Eyes:     Comments: intubated  Cardiovascular:     Rate and Rhythm: Normal rate and regular rhythm.  Pulmonary:     Comments: Ventilated breath sounds Abdominal:     Comments: Distended abdomen.  Significant scarring/previous surgical scars.  Ostomy bag present.  Skin:    General: Skin is warm and dry.  Neurological:     Comments: Intubated and sedated, responds to verbal stimuli.     Vital signs: Vitals:   12/25/22 0739 12/25/22 0800  BP:  108/68  Pulse:  78  Resp:  16  Temp: 99 F (37.2 C) 98.8 F (37.1 C)  SpO2:  97%        GI:  Lab Results: Recent Labs    12/24/22 1947 12/24/22 2010 12/24/22 2116 12/24/22 2348 12/25/22 0432  WBC 13.8*  --   --  11.5* 8.5  HGB 14.1   < > 10.9* 13.1 12.6*  HCT 44.6   < > 32.0* 41.8 39.7  PLT 208  --   --  187 171   < > = values in this interval not displayed.   BMET Recent Labs    12/24/22 1947 12/24/22 2010 12/24/22 2116 12/24/22 2348 12/25/22 0432  NA 139 140 138  --  140  K 4.0 4.0 3.7  --  4.3  CL 102 106  --   --  104  CO2 25  --   --   --  25  GLUCOSE 192* 188*  --   --  152*  BUN 11 11  --   --  10  CREATININE 1.03 0.90  --  1.09 1.04  CALCIUM 9.4  --   --   --  8.6*   LFT Recent Labs    12/24/22 1947  PROT 6.8  ALBUMIN 3.7  AST 19  ALT 11  ALKPHOS 57  BILITOT 0.8   PT/INR No results for input(s): "LABPROT", "INR" in the last 72 hours.   Studies/Results: CT Angio Chest/Abd/Pel for Dissection W and/or Wo Contrast  Result Date: 12/24/2022 CLINICAL DATA:  Acute aortic syndrome suspected. Bowel obstruction. Abdominal pain. History of Crohn's disease. EXAM: CT ANGIOGRAPHY CHEST, ABDOMEN AND PELVIS TECHNIQUE: Initial noncontrast chest CT was performed axial only, from the lung apices to the adrenal glands. Multidetector CT imaging through the chest, abdomen and pelvis was performed using the standard protocol during bolus administration of intravenous contrast. Multiplanar  reconstructed images and MIPs were obtained and reviewed to evaluate the vascular anatomy. RADIATION DOSE REDUCTION: This exam was performed according to the departmental dose-optimization program which includes automated exposure control, adjustment of the mA and/or kV according to patient size and/or use of iterative reconstruction technique. CONTRAST:  100 mL Omnipaque 350 IV. COMPARISON:  No prior chest CT for comparison. Most recent chest x-ray was portable chest today, portable chest 11/29/2022. Comparison is made with abdomen and pelvis CTs with IV contrast from 11/29/2022 and 09/10/2022. FINDINGS: CTA CHEST FINDINGS Cardiovascular: There is mild cardiomegaly with a left chamber predominance. Minimal anterior pericardial effusion. There is scattered calcific plaque in the LAD coronary artery only. Left ventricular wall hypertrophy is seen except for a small portion of the apex which demonstrates marked wall thinning consistent with a small chronic apical infarct. The pulmonary veins are decompressed. Pulmonary arteries are normal caliber and centrally clear. The aorta is mildly tortuous. There is minimal scattered soft plaque in the descending aorta with no other visible thoracic aortic plaques. There is no thoracic aortic aneurysm, dissection or stenosis. The great vessels are clear and branch normally. No wall calcification is seen. Mediastinum/Nodes: The patient is intubated. Tip of the ETT is 2.5 cm from the carina. NG tube loops around the stomach towards the left, the tip in the proximal upper stomach. The thoracic esophagus is unremarkable. The trachea is otherwise clear. No intrathoracic, axillary or supraclavicular adenopathy is seen. 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 On the  right there is a heterogeneous nodule measuring 1.4 cm. Nonemergent thyroid ultrasound is recommended. The left sided mass displaces the trachea left of  midline but does not narrow it. Lungs/Pleura: There are trace pleural effusions. No pleural thickening or pneumothorax. The lungs are hypoexpanded. There is posterior basal opacity on both sides which could be atelectasis or combination of atelectasis and pneumonia. There are minimal paraseptal emphysematous changes in the extreme lung apices. Remaining lungs are clear. Musculoskeletal: Mild thoracic dextroscoliosis and degenerative change thoracic spine. 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 There is secondary DJD in the right shoulder. No destructive or aggressive bone lesion. The ribcage is intact. Review of the MIP images confirms the above findings. CTA ABDOMEN AND PELVIS FINDINGS VASCULAR Aorta: There are minimal scattered calcific plaques in the infrarenal aorta. There is no stenosis, dissection, or aneurysm or penetrating ulcer. Celiac: Normal. SMA: Normal. Renals: Normal. IMA: Normal. Inflow: Minimal calcification right common iliac artery. Remainder of the bilateral inflow vessels show no plaques. All inflow arteries are widely patent. The visualized outflow arteries are normal. Veins: Patent SVC. Other systemic veins and the portal veins are unopacified and not evaluated. 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 Review of the MIP images confirms the above findings. NON-VASCULAR Hepatobiliary: 18 cm in length liver with mild steatosis. No mass enhancement. Multiple cysts are again noted largest is 8 cm in the left lobe. Others are significantly smaller. Gallbladder and bile ducts are unremarkable. Pancreas: No abnormality. Spleen: No abnormality. Adrenals/Urinary Tract: There is no adrenal mass. Bilateral nephrolithiasis with scattered punctate up to 1 mm stones on the right, scattered 1-2 mm stones on the left, 1.4 cm stone in  the left upper pole and irregular 1 cm stone in the left lower pole. There are multiple bilateral too small to characterize hypodensities in both kidneys. No follow-up imaging is recommended [facility] 2018 Feb; 264-273, Management of the Incidental Renal Mass on CT, [employer] 2021; 814-848, Bosniak Classification of Cystic Renal Masses, Version 2019). No new cortical abnormality is seen. There is bilateral renal cortical thinning on the left-greater-than-right. Scattered small cortical scarring. Chronic pyelectasis on the left-greater-than-right is seen, without evidence of ureteral stones or ureteral dilatation. The bladder is poorly visualized due to spray artifact from a left hip arthroplasty, but unremarkable where seen. Stomach/Bowel: Mild fluid distention of the stomach. NGT in place as described above. Surgical changes of right hemicolectomy, distal small bowel resection and primary ileocolic surgical anastomosis are redemonstrated. There is a small defect in the right anterolateral mid abdominal wall at the level of the surgical anastomosis. A fluid-filled small caliber tubular structure extends through the defect to the skin surface, with surrounding skin thickening, unknown if this is a purposeful ileostomy or a fistula from nearby small bowel but there did not seem to be communication with the skin surface on the prior studies. There is additional defect in the right lateral abdominal wall below this level with fat hernia, multilevel postsurgical changes to the more anterior abdominal wall and asymmetric right rectus atrophy and abdominal wall thinning. Again noted is wall thickening in the distal ileum extending up to the surgical anastomosis, but the inflammatory changes involving this loop (anterior right lower abdomen, series 14 axial [id number]) are not as pronounced as previously. There is increased dilatation of multiple upstream small bowel segments above this level as well as below  this level up to  4.2 cm and multiple small bowel loops adherent to the anterior wall. A surgically widened segment of small bowel is also more dilated than previously, currently 7 cm anterior mid abdomen, previously 5.4 cm. The transition to decompressed caliber appears to be at the inferior aspect of the surgically widened segment on coronal reconstruction series 10 images 21-24, with the only normal caliber segment in this area being the inflamed distal ileal anastomotic segment. The gastric wall, duodenum and jejunum appear normal. The large bowel wall is normal thickness with scattered diverticulosis. Lymphatic: No adenopathy is seen. Reproductive: The prostate poorly visualized due to artifact from the left hip replacement but does measure enlarged at 4.9 cm. Other: No free fluid, free hemorrhage or free air. No incarcerated hernia. 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 Mild chronic compression fractures L1 and L2. No destructive bone lesion is seen. Review of the MIP images confirms the above findings. IMPRESSION: 1. No findings of aortic aneurysm or dissection. Minimal abdominal aortic and iliac atherosclerosis. 2. Cardiomegaly with left chamber predominance, small anterior pericardial effusion. 3. 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 4. Trace pleural effusions with bilateral posterior basal opacity which could be atelectasis, pneumonia or combination. 5. Bilateral thyroid nodules, largest on the left measuring 4.0 x 2.7 x 4.6 cm. Nonemergent thyroid ultrasound recommended. 6. Bilateral nephrolithiasis with chronic pyelectasis left-greater-than-right, but no ureteral stones or ureteral dilatation. 7. Surgical changes of right hemicolectomy, distal small bowel resection and primary ileocolic surgical anastomosis. 8. Small defect in the right anterolateral mid abdominal wall at the  level of the surgical anastomosis. There is a fluid-filled tubular structure extending through the defect to the skin surface, unknown if this is a purposeful ileostomy or a fistula from nearby small bowel. There did not seem to be communication with the skin surface on the prior studies. 9. Increased dilatation of multiple small bowel segments, with transition to decompressed caliber at the inferior aspect of the anterior mid abdominal surgically widened segment just above the thickened distal ileal anastomotic segment. Findings consistent with small-bowel obstruction probably related to distal ileitis. Inflammatory changes along the anastomotic segment are less prominent than previously. 10. Minimal apical emphysema. 11. Stable hepatic cysts. 12. Bilateral renal cortical thinning and stones. No obstructing stones. 13. Prostatomegaly. 14. Diverticulosis. Emphysema (ICD10-J43.9). Electronically Signed   By: Telford Nab M.D.   On: 12/24/2022 22:20   CT Head Wo Contrast  Result Date: 12/24/2022 CLINICAL DATA:  Mental status change, unknown cause EXAM: CT HEAD WITHOUT CONTRAST TECHNIQUE: Contiguous axial images were obtained from the base of the skull through the vertex without intravenous contrast. RADIATION DOSE REDUCTION: This exam was performed according to the departmental dose-optimization program which includes automated exposure control, adjustment of the mA and/or kV according to patient size and/or use of iterative reconstruction technique. COMPARISON:  CT head 12/19/2022 FINDINGS: Brain: Stable possible chronic lacunar infarction of the right posterior limb of the internal capsule versus CSF space. No evidence of large-territorial acute infarction. No parenchymal hemorrhage. No mass lesion. No extra-axial collection. No mass effect or midline shift. No hydrocephalus. Basilar cisterns are patent. Vascular: No hyperdense vessel. Atherosclerotic calcifications are present within the cavernous internal  carotid and vertebral arteries. Skull: No acute fracture or focal lesion. Sinuses/Orbits: Paranasal sinuses and mastoid air cells are clear. The orbits are unremarkable. Other: None. IMPRESSION: No acute intracranial abnormality. Electronically Signed  By: Iven Finn M.D.   On: 12/24/2022 21:30   DG Abdomen 1 View  Result Date: 12/24/2022 CLINICAL DATA:  abd pain, ng tube EXAM: ABDOMEN - 1 VIEW COMPARISON:  X-ray abdomen 11/30/2022 FINDINGS: Enteric tube courses below the hemidiaphragm with tip and side port overlying the expected region of the gastric lumen. Large loop of enteric tube noted within the gastric lumen inferior to the hemidiaphragm. The bowel gas pattern is normal. No radio-opaque calculi or other significant radiographic abnormality are seen. IMPRESSION: Enteric tube in good position with large loop within the gastric lumen. Recommend retracting by 10 cm. Electronically Signed   By: Iven Finn M.D.   On: 12/24/2022 20:45   DG Chest Portable 1 View  Result Date: 12/24/2022 CLINICAL DATA:  ET tube placement. EXAM: PORTABLE CHEST 1 VIEW COMPARISON:  12/24/2022 at 8:16 p.m. FINDINGS: Endotracheal tube has been retracted, tip now projecting 3.2 cm above the carina. No other change. Nasal/orogastric tube is stable and well positioned. IMPRESSION: 1. Repositioned, well-positioned endotracheal tube. Electronically Signed   By: Lajean Manes M.D.   On: 12/24/2022 20:38   DG Chest Portable 1 View  Result Date: 12/24/2022 CLINICAL DATA:  ET tube placement. EXAM: PORTABLE CHEST 1 VIEW COMPARISON:  11/29/2022. FINDINGS: Endotracheal tube tip lies at the upper carina. Nasal/orogastric tube passes well below the diaphragm and into the stomach. Cardiac silhouette normal in size.  No mediastinal or hilar masses. Lung volumes are low. Mild opacity at the lung bases is consistent with atelectasis. Remainder of the lungs is clear. No convincing pleural effusion and no pneumothorax. IMPRESSION: 1.  Endotracheal tube tip projects at the upper carina. 2. Well-positioned nasal/orogastric tube. 3. Low lung volumes with mild basilar atelectasis. Electronically Signed   By: Lajean Manes M.D.   On: 12/24/2022 20:37    Impression: Abdominal pain, history of Crohn's - CTA chest abdomen pelvis that showed possible atelectasis versus pneumonia, thyroid nodules, nephrolithiasis, increased dilation of multiple small bowel segments consistent with small bowel obstruction possibly related to distal ileus. -BUN 10, creatinine 1.04 -No leukocytosis -Hgb 12.6  Plan: Patient currently intubated and sedated with multiple medical surgery not planning to do procedures at this time due to complex.  Will manage Crohn's disease accordingly. Recommend continue Solu-Medrol IV Continue supportive care [facility] will follow    LOS: 1 day   DARVIS CARTON  PA-C 12/25/2022, 8:27 AM  Contact #  [phone]

## 2022-12-25 NOTE — Progress Notes (Signed)
NAME:  [name], MRN:  [id number], DOB:  Jun 20, 1956, LOS: 1 ADMISSION DATE:  12/24/2022, CONSULTATION DATE:  12/24/2022 REFERRING MD:  Dr. Tomi Bamberger, CHIEF COMPLAINT:  abdominal pain   History of Present Illness:  Patient is a 67 year old male with pertinent PMH Crohn's disease with ileostomy in place, kidney stones, DMT2, recent seizures on keppra presents to [facility] ED on 3/11 with severe abdominal pain.   On 2/11 patient presented to [facility] ED with AMS, vomiting and abdominal distention.  Patient complaining of acute onset diffuse abdominal pain.  Upon arrival to ED patient moaning and crying in pain and not answering any questions.  Patient not able to lay still and altered.  BP 160/97 and afebrile.  Respiratory rate 20s.  Lipase 31.  UA unremarkable.  Patient intubated for diagnostics.  CT head no acute abnormality.  CTA chest/abdomen/pelvis trace pleural effusions with bilateral posterior basilar opacity possible atelectasis versus pneumonia; bilateral thyroid nodules; bilateral nephrolithiasis with chronic pyelectasis L greater than R but no ureteral stone/ureteral dilatation; increased dilatation of multiple small bowel segments consistent with small bowel obstruction probably related to distal ileitis.  GI and surgery consulted. PCCM consulted for ICU admission. Pertinent  Medical History   Past Medical History:  Diagnosis Date   Abdominal wall abscess 01/03/2017   Arthritis    Bladder stone    Cancer ([place])    mycosis fungoides   Crohn's disease ([place])    Crohn's disease with abscess ([place]) 07/23/2015   Diabetes mellitus without complication (HCC)    History of aseptic necrosis of bone BILATERAL HIPS   S/P BONE GRAFT   History of kidney stones    History of mycosis fungoides    S/P ileostomy ([place])    Significant Hospital Events: Including procedures, antibiotic start and stop dates in addition to other pertinent events   02/11: Intubated w/ AMS and severe abdominal pain  Interim  History / Subjective:  Patient remains intubated but able to follow commands. He has guarding of his abdomen due to pain. It does not appear that he will require/be appropriate for surgical intervention at this time.  Objective   Blood pressure 108/68, pulse 78, temperature 98.8 F (37.1 C), resp. rate 16, weight 76.2 kg, SpO2 97 %.    Vent Mode: PRVC FiO2 (%):  [40 %-100 %] 40 % Set Rate:  [16 bmp] 16 bmp Vt Set:  [510 mL] 510 mL PEEP:  [5 cmH20] 5 cmH20 Plateau Pressure:  [15 cmH20-17 cmH20] 17 cmH20   Intake/Output Summary (Last 24 hours) at 12/25/2022 0842 Last data filed at 12/25/2022 0600 Gross per 24 hour  Intake 2848.11 ml  Output 855 ml  Net 1993.11 ml   Filed Weights   12/25/22 0053 12/25/22 0334  Weight: 72.6 kg 76.2 kg    Examination: Constitutional:Ill-appearing gentleman, intubated, mildly sedated. In no acute distress. Cardio:Regular rate and rhythm. No murmurs, rubs, or gallops. Pulm:Intubated. Abdomen:Voluntary guarding. Skin:Warm and dry. Neuro:Mildly sedated but will open eyes to voice and follow some commands.  Resolved Hospital Problem list     Assessment & Plan:   SBO 2/2 distal ileitis in setting of Crohn's disease Imaging on admission concerning for distal ileitis secondary to Crohn's disease. Currently NPO with NG tube placed. On admission there was concern for possible sepsis 2/2 his GI illness and possible pneumonia seen on imaging. Lactic increased overnight to 2.9 from 2.5 on admission. No leukocytosis, afebrile. GI and surgery consulted. He is not an appropriate surgical candidate at this  time given complexity and high-risk nature of his history. He is on zosyn, IVF. He has guarding on abdominal exam.  Plan: -Continue to hold home humira, mesalamine -Follow GI, surgery recommendations -F/u blood and tracheal aspirate cultures -Continue zosyn 3.375 mg q8h, discontinue tomorrow 02/13 -Continue IVF -Continue solumedrol 80 mg IV daily -Continue  ileostomy care -Trend LA, WBC, fever curve -Hold home humira, mesalamine while NPO   Acute respiratory failure 2/2 encephalopathy Possible posterior basilar atelectasis versus pneumonia Initially intubated due to AMS and need for airway protection during ongoing diagnotic work-up. We are weaning his sedation and doing SBT with plan to extubate later if he tolerates this well. Plan: -Wean sedation for RASS goal 0 to -1 -SBT, extubate if tolerates well -LTVV strategy with tidal volumes of 6-8 cc/kg ideal body weight -Wean PEEP/FiO2 for SpO2 >92% -VAP bundle in place -Daily SAT and SBT -PAD protocol in place  Hx of seizures Keppra level pending. On IV Keppra, 750 mg BID. UDS pending. Ammonia, ethanol levels normal. Triglycerides elevated to 299. RASS -2 on my exam. Plan: -F/u keppra level -Limit sedation if possible, RASS 0 to -1 -Consider EEG   DMT2 HbA1c 7.2%. Currently being managed with SSI. Plan: -Continue SSI, CBG monitoring   HLD Home regimen includes ASA, atorvastatin. Plan: -Hold home medications while NPO.   Bilateral thyroid nodules:  Incidentally noted on CT, largest nodule on left measuring 4.0 x 2.7 x 4.6 cm. Plan: -Recommend outpatient follow-up   Hx of BPH Documented UOP since admission 455 mL. None documented this shift. Plan: -Holding home flomax  Best Practice (right click and "Reselect all SmartList Selections" daily)   Diet/type: NPO DVT prophylaxis: LMWH GI prophylaxis: PPI, zofran Lines: N/A Foley:  Yes, and it is still needed Code Status:  full code Last date of multidisciplinary goals of care discussion [Pending]  Labs   CBC: Recent Labs  Lab 12/19/22 1821 12/24/22 1947 12/24/22 2010 12/24/22 2116 12/24/22 2348 12/25/22 0432  WBC 7.1 13.8*  --   --  11.5* 8.5  NEUTROABS 5.1  --   --   --   --   --   HGB 11.4* 14.1 15.6 10.9* 13.1 12.6*  HCT 36.2* 44.6 46.0 32.0* 41.8 39.7  MCV 84.0 83.4  --   --  85.0 83.4  PLT 143* 208  --    --  187 [id number]    Basic Metabolic Panel: Recent Labs  Lab 12/19/22 2233 12/24/22 1947 12/24/22 2010 12/24/22 2116 12/24/22 2348 12/25/22 0432  NA 141 139 140 138  --  140  K 2.8* 4.0 4.0 3.7  --  4.3  CL 109 102 106  --   --  104  CO2 21* 25  --   --   --  25  GLUCOSE 146* 192* 188*  --   --  152*  BUN 18 11 11  $ --   --  10  CREATININE 0.97 1.03 0.90  --  1.09 1.04  CALCIUM 8.8* 9.4  --   --   --  8.6*  MG  --   --   --   --  1.6*  --    GFR: Estimated Creatinine Clearance: 62.2 mL/min (by C-G formula based on SCr of 1.04 mg/dL). Recent Labs  Lab 12/19/22 1821 12/24/22 1947 12/24/22 2348 12/25/22 0432  PROCALCITON  --   --  <0.10  --   WBC 7.1 13.8* 11.5* 8.5  LATICACIDVEN  --   --  2.5* 2.9*  Liver Function Tests: Recent Labs  Lab 12/19/22 2233 12/24/22 1947  AST 17 19  ALT 11 11  ALKPHOS 41 57  BILITOT 0.4 0.8  PROT 5.4* 6.8  ALBUMIN 3.1* 3.7   Recent Labs  Lab 12/24/22 1947  LIPASE 31   Recent Labs  Lab 12/24/22 2348  AMMONIA 24    ABG    Component Value Date/Time   PHART 7.396 12/24/2022 2116   PCO2ART 42.7 12/24/2022 2116   PO2ART 545 (H) 12/24/2022 2116   HCO3 26.2 12/24/2022 2116   TCO2 27 12/24/2022 2116   O2SAT 100 12/24/2022 2116     Coagulation Profile: No results for input(s): "INR", "PROTIME" in the last 168 hours.  Cardiac Enzymes: No results for input(s): "CKTOTAL", "CKMB", "CKMBINDEX", "TROPONINI" in the last 168 hours.  HbA1C: Hgb A1c MFr Bld  Date/Time Value Ref Range Status  12/24/2022 11:48 PM 7.2 (H) 4.8 - 5.6 % Final    Comment:    (NOTE) Pre diabetes:          5.7%-6.4%  Diabetes:              >6.4%  Glycemic control for   <7.0% adults with diabetes   07/31/2022 11:06 AM 6.3 (H) 4.8 - 5.6 % Final    Comment:    (NOTE) Pre diabetes:          5.7%-6.4%  Diabetes:              >6.4%  Glycemic control for   <7.0% adults with diabetes     CBG: Recent Labs  Lab 12/19/22 1841 12/25/22 0023  12/25/22 0316 12/25/22 0738  GLUCAP 148* 130* 130* 166*    Review of Systems:   Negative unless otherwise stated.  Past Medical History:  He,  has a past medical history of Abdominal wall abscess (01/03/2017), Arthritis, Bladder stone, Cancer ([place]), Crohn's disease ([place]), Crohn's disease with abscess ([place]) (07/23/2015), Diabetes mellitus without complication ([place]), History of aseptic necrosis of bone (BILATERAL HIPS), History of kidney stones, History of mycosis fungoides, and S/P ileostomy ([place]).   Surgical History:   Past Surgical History:  Procedure Laterality Date   BONE GRAFT OF LEFT HIP  11/13/1984   ASEPTIC NECROSIS   COLONOSCOPY     CYSTO/ BILATERAL RETROGRADE PYELOGRAM/ LEFT URETERAL STONE EXTRACTION / BILATERAL STENT PLACEMENT  10/07/2003   CYSTOSCOPY WITH LITHOLAPAXY  02/12/2012   Procedure: CYSTOSCOPY WITH LITHOLAPAXY;  Surgeon: Claybon Jabs, MD;  Location: [facility];  Service: Urology;  Laterality: N/A;   EXPL. LAP. W/ ENTEROLYSIS, RESECTION INFLAMMATORY MASS RLQ / TAKE-DOWN OF FISTULA WITH ENTEROENTEROSTOMY/ RESECTION WITH THE SMALL BOWEL TO ASCENDING COLON ANASTOMOSIS  10/29/2000   CROHN'S DISEASE W/ ANASTOMOTIC INFLAMMATORY MASS/    10-30-2000 EXPL. LAP. CONTROL POST-OP ABD. BLEEDING   EXPLORATORY LAPAROTOMY/ RESECTION OF ILEOCOLONIC ANASTOMOSIS AND CREATION OF ILEOSTOMY  11/04/2000   ABD. PERFORATION   HARDWARE REMOVAL Left 08/07/2022   Procedure: HARDWARE REMOVAL;  Surgeon: Dorna Leitz, MD;  Location: [facility];  Service: Orthopedics;  Laterality: Left;   [place] N/A 07/23/2015   Procedure: Takedown ileostomy and repair of ostomy hernia, extensive entrolysis (2.5 hrs), ileostransverse colon anastomosis; closure of massive ventral hernia with 20 x 30 Strattice mesh;  Surgeon: Johnathan Hausen, MD;  Location: [facility];  Service: General;  Laterality: N/A;   INCISIONAL HERNIA REPAIR  07/23/2015    Strattice mesh   IRRIGATION AND DEBRIDEMENT  ABSCESS Right 09/09/2014   Procedure: MINOR INCISION AND DRAINAGE OF ABSCESS;  Surgeon: Charlotte Crumb, MD;  Location: [facility];  Service: Orthopedics;  Laterality: Right;  incision and drainage right long finger    IRRIGATION AND DEBRIDEMENT ABSCESS N/A 01/03/2017   Procedure: IRRIGATION AND DEBRIDEMENT ABDOMINAL WALL ABSCESS;  Surgeon: Johnathan Hausen, MD;  Location: [facility];  Service: General;  Laterality: N/A;   LAPAROSCOPY N/A 07/23/2015   Procedure: LAPAROSCOPY DIAGNOSTIC;  Surgeon: Johnathan Hausen, MD;  Location: [facility];  Service: General;  Laterality: N/A;   ORIF ANKLE FRACTURE Left 08/27/2014   Procedure:  open reduction internal fixation left ankle;  Surgeon: Nita Sells, MD;  Location: [facility];  Service: Orthopedics;  Laterality: Left;  Left open reduction internal fixation ankle   RIGHT URETEROSCOPIC STONE EXTRACITON  04-24-2005  & 12-01-2002   TOTAL HIP ARTHROPLASTY Left 08/07/2022   Procedure: TOTAL HIP ARTHROPLASTY ANTERIOR APPROACH;  Surgeon: Dorna Leitz, MD;  Location: [facility];  Service: Orthopedics;  Laterality: Left;     Social History:   reports that he quit smoking about 28 years ago. His smoking use included cigarettes. He has never used smokeless tobacco. He reports that he does not drink alcohol and does not use drugs.   Family History:  His family history includes Alzheimer's disease in his father; Cancer in his father and sister.   Allergies Allergies  Allergen Reactions   Nsaids Other (See Comments)    Crohn's disease, told to avoid NSAIDs     Home Medications  Prior to Admission medications   Medication Sig Start Date End Date Taking? Authorizing Provider  aspirin 81 MG chewable tablet Chew 1 tablet (81 mg total) by mouth daily. 12/02/22   [name], MD  atorvastatin (LIPITOR) 40 MG tablet Take 1 tablet (40 mg total) by mouth daily. 12/02/22   [name], MD   Cholecalciferol (VITAMIN D-3 PO) Take 1 capsule by mouth in the morning.    [provider]  Cyanocobalamin (VITAMIN B-12 PO) Take 1 tablet by mouth in the morning.    [provider]  diazePAM (VALTOCO 10 MG DOSE) 10 MG/0.1ML LIQD Place 10 mg into the nose as needed (seizure lasting greater than 5 minutes). 12/02/22   [name], MD  HUMIRA, 2 PEN, 40 MG/0.4ML PNKT Inject 40 mg into the skin every 14 (fourteen) days. 11/17/22   [provider]  levETIRAcetam (KEPPRA) 750 MG tablet Take 1 tablet (750 mg total) by mouth 2 (two) times daily. 12/21/22   Marcial Pacas, MD  mesalamine (LIALDA) 1.2 g EC tablet Take 2.4 g by mouth 2 (two) times daily. 07/07/22   [provider]  metFORMIN (GLUCOPHAGE) 500 MG tablet Take 500 mg by mouth 2 (two) times daily. 05/23/21   [provider]  potassium chloride SA (KLOR-CON M) 20 MEQ tablet Take 2 tablets (40 mEq total) by mouth daily. 12/20/22   [place], Barbette Hair, MD  potassium citrate (UROCIT-K) 10 MEQ (1080 MG) SR tablet Take 40 mEq by mouth 4 (four) times daily.    [provider]  tadalafil (CIALIS) 20 MG tablet Take 20 mg by mouth daily as needed for erectile dysfunction. 05/04/21   [provider]  tamsulosin (FLOMAX) 0.4 MG CAPS capsule Take 1 capsule (0.4 mg total) by mouth daily after supper. Patient taking differently: Take 0.4 mg by mouth in the morning. 01/12/17   Earnstine Regal, PA-C  Trospium Chloride 60 MG CP24 Take 60 mg  by mouth in the morning. 06/04/22   [provider]     Critical care time: 35 minutes     Farrel Gordon, DO Internal Medicine PGY-2 [phone]

## 2022-12-25 NOTE — Progress Notes (Signed)
eLink Physician-Brief Progress Note Patient Name: [name] DOB: December 02, 1955 MRN: [id number]   Date of Service  12/25/2022  HPI/Events of Note  42 M hx of Crohn's s/p ileostomy on Humira and meselamine, nephrolithiasis, DM, seizure presented with abdominal pain/distension, vomiting and altered sensorium. CT with evidence of small bowel obstruction. Patient now intubated for airway protection.  eICU Interventions  BP borderline, ordered another 500 cc LR bolus GI and surgery consulted Started on piperacillin-tazobactam     Intervention Category Intermediate Interventions: Hypotension - evaluation and management;Diagnostic test evaluation Evaluation Type: New Patient Evaluation  Shona Needles Makayli Bracken 12/25/2022, 2:09 AM

## 2022-12-25 NOTE — Progress Notes (Signed)
Initial Nutrition Assessment  DOCUMENTATION CODES:   Not applicable  INTERVENTION:  If pt remains NPO with NGT to LIWS, consider initiation of TPN by day 5-7.  NUTRITION DIAGNOSIS:   Inadequate oral intake related to inability to eat as evidenced by NPO status.  GOAL:   Patient will meet greater than or equal to 90% of their needs  MONITOR:   Vent status, Labs, Weight trends, Skin, I & O's  REASON FOR ASSESSMENT:   Ventilator    ASSESSMENT:   67 year old male with PMHx of Crohn's disease s/p multiple abdominal surgeries and chronic RLQ abdominal wall wound/fistula, DM, kidney stones, seizures admitted with abdominal distention, vomiting, and AMS found to have recurrent pSBO due to distal ileitis in the setting of Crohn's disease.  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  Ileo-ascending colon anastomosis 2001. - Takeback, washout, with creation of end ileostomy 2001 - Laparoscopic converted to open lysis adhesions with takedown of ileostomy.  Small bowel to transverse colon anastomosis, closure of parastomal hernia, complex abdominal repair with 30 x 20 cm Strattice biologic mesh 07/23/2015. - Incision and drainage of right-sided abdominal wall abscess with strong evidence of colocutaneous fistula 01/03/2017.  Pertinent history this admission:  2/11: intubated 2/12: OGT replaced with NGT  Per Surgery note no emergent surgical needs today. Plan is to monitor abdominal wound/fistula and if output increases will plan to place E[place]ins pouch.  Met with pt and wife at bedside. Pt awake at time of RD assessment and able to communicate by writing on marker board. History obtained from pt and wife. Pt has a good appetite and intake at baseline. Wife reports he eats 3 meals per day and always eats well at meals. Unable to obtain further details on typical intake at meals.  Pt  reports his UBW was 176 lbs (80 kg) and that he has had some weight loss over time. Per review of chart pt was 76.2 kg on 06/23/21. He lost down to 71.5 kg on 08/07/22. Weights have then been trending up. Current wt is 76.2 kg (167.99 lbs). Will continue to monitor trends.  Patient is currently intubated on ventilator support MV: 8.4 L/min Temp (24hrs), Avg:97.6 F (36.4 C), Min:96.1 F (35.6 C), Max:99.3 F (37.4 C)  Medications reviewed and include: Novolog 0-9 units Q4hrs, Solu-Medrol 80 mg daily IV, pantoprazole, fentanyl gtt, LR at 100 mL/hour, Keppra, Zosyn. Propofol gtt stopped.  Labs reviewed: CBG 123-166, Triglycerides 299 Vitamin B12 520 WNL 12/01/22 Folate 15.5 WNL 12/01/22  Enteral Access: 16 Fr. NGT placed 2/12; terminates in distal stomach per abdominal x-ray 2/12; currently to LIWS  UOP: 455 mL UOP previous 24 hours  OG/NG output: 400 mL previous 24 hours  I/O: +2259.2 mL since admission  Pt does not meet criteria for malnutrition at this time.  NUTRITION - FOCUSED PHYSICAL EXAM:  Flowsheet Row Most Recent Value  Orbital Region No depletion  Upper Arm Region No depletion  Thoracic and Lumbar Region No depletion  Buccal Region Unable to assess  Temple Region No depletion  Clavicle Bone Region No depletion  Clavicle and Acromion Bone Region No depletion  Scapular Bone Region Unable to assess  Dorsal Hand Mild depletion  Patellar Region No depletion  Anterior Thigh Region No depletion  Posterior Calf Region No depletion  Edema (RD Assessment) None  Hair Reviewed  Eyes Reviewed  Mouth Unable to assess  Skin Reviewed  Nails Reviewed      Diet Order:   Diet Order             Diet NPO time specified  Diet effective now                  EDUCATION NEEDS:   No education needs have been identified at this time  Skin:  Skin Assessment: Skin Integrity Issues: Skin Integrity Issues:: Other (Comment) Other: chronic RLQ abdominal wall  wound/fistula  Last BM:  PTA  Height:   Ht Readings from Last 1 Encounters:  12/21/22 5' 6"$  (1.676 m)   Weight:   Wt Readings from Last 1 Encounters:  12/25/22 76.2 kg   Ideal Body Weight:  64.5 kg  BMI:  Body mass index is 27.11 kg/m.  Estimated Nutritional Needs:   Kcal:  2000-2200  Protein:  100-110 grams  Fluid:  2-2.2 L/day  Loanne Drilling, MS, RD, LDN, CNSC Pager number available on Amion

## 2022-12-25 NOTE — Progress Notes (Addendum)
Attending note: I have seen and examined the patient. History, labs and imaging reviewed.  67 Y/O with crohn's disease with mutliple abd surgeries in past admitted with abd distension, SBO in setting of Crohn's flare Intubated yesterday due to agitation and to allow work up to be completed  Blood pressure 108/68, pulse 78, temperature 98.8 F (37.1 C), resp. rate 16, weight 76.2 kg, SpO2 97 %. Gen:      No acute distress HEENT:  EOMI, sclera anicteric Neck:     No masses; no thyromegaly Lungs:    Clear to auscultation bilaterally; normal respiratory effort CV:         Regular rate and rhythm; no murmurs Abd:      Distended, mild tenderness Ext:    No edema; adequate peripheral perfusion Skin:      Warm and dry; no rash Neuro: alert and oriented x 3 Psych: normal mood and affect   Labs/Imaging personally reviewed, significant for CT abd pelvis 12/24/2022 showing dilatation of small bowel segments consistent with small bowel obstruction, distal ileitis  Assessment/plan: SBO, distal ileitis secondary to chron's disease Appreciate surgery reccs On zosyn. Low threshold to discontinue NG tube to LIS On solumedrol GI to see patient  Vent dependence Start SBTs as tolerated  The patient is critically ill with multiple organ systems failure and requires high complexity decision making for assessment and support, frequent evaluation and titration of therapies, application of advanced monitoring technologies and extensive interpretation of multiple databases.  Critical care time - 35 mins. This represents my time independent of the NPs time taking care of the pt.  Marshell Garfinkel MD [facility] 12/25/2022, 10:03 AM

## 2022-12-25 NOTE — Consult Note (Signed)
[name] 1956/04/12  [id number].    Requesting MD: Eric Browner, MD Chief Complaint/Reason for Consult: SBO, Crohn's disease  HPI:  [name] is a 67 yo male with a long-standing history of Crohn's disease, for which he has had multiple abdominal surgeries in the past and has a chronic wound on the RLQ abdominal wall. He is followed by Dr. Alessandra Holt at [facility]. He presented to the ED 2/11 with abdominal distention, vomiting, and AMS. He was intubated in the ED due to agitation/AMS so that he could get a diagnostic workup. NG tube was placed. CTA chest/abd/pelvis showed thickening of the distal ileum with upstream small bowel dilation (4.2 cm) as well as the known right sided abdominal wall fistula. General surgery is asked to consult.  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  Ileo-ascending colon anastomosis 2001. - Takeback, washout, with creation of end ileostomy 2001 - Laparoscopic converted to open lysis adhesions with takedown of ileostomy.  Small bowel to transverse colon anastomosis, closure of parastomal hernia, complex abdominal repair with 30 x 20 cm Strattice biologic mesh 07/23/2015. - Incision and drainage of right-sided abdominal wall abscess with strong evidence of colocutaneous fistula 01/03/2017.   He was previously admitted in October 2023 with abdominal pain and increased drainage from his abdominal wall wound, and was seen by our group at that time, as well as GI. At that time he had similar inflammation of the distal ileum on imaging, and was treated with steroids while inpatient. There was discussion of starting biologic therapy while outpatient, but it is not clear if this has been started.  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   Crohn's disease ([place])    Crohn's disease with abscess ([place]) 07/23/2015   Diabetes mellitus without complication (HCC)    History of aseptic necrosis of bone BILATERAL HIPS   S/P BONE GRAFT   History of kidney stones    History of mycosis fungoides    S/P ileostomy ([place])     Past Surgical History:  Procedure Laterality Date   BONE GRAFT OF LEFT HIP  11/13/1984   ASEPTIC NECROSIS   COLONOSCOPY     CYSTO/ BILATERAL RETROGRADE PYELOGRAM/ LEFT URETERAL STONE EXTRACTION / BILATERAL STENT PLACEMENT  10/07/2003   CYSTOSCOPY WITH LITHOLAPAXY  02/12/2012   Procedure: CYSTOSCOPY WITH LITHOLAPAXY;  Surgeon: Eric Jabs, MD;  Location: [facility];  Service: Urology;  Laterality: N/A;   EXPL. LAP. W/ ENTEROLYSIS, RESECTION INFLAMMATORY MASS RLQ / TAKE-DOWN OF FISTULA WITH ENTEROENTEROSTOMY/ RESECTION WITH THE SMALL BOWEL TO ASCENDING COLON ANASTOMOSIS  10/29/2000   CROHN'S DISEASE W/ ANASTOMOTIC INFLAMMATORY MASS/    10-30-2000 EXPL. LAP. CONTROL POST-OP ABD. BLEEDING   EXPLORATORY LAPAROTOMY/ RESECTION OF ILEOCOLONIC ANASTOMOSIS AND CREATION OF ILEOSTOMY  11/04/2000   ABD. PERFORATION   HARDWARE REMOVAL Left 08/07/2022   Procedure: HARDWARE REMOVAL;  Surgeon: Eric Leitz, MD;  Location[facility];  Service: Orthopedics;  Laterality: Left;   [place]  ILEOSTOMY CLOSURE N/A 07/23/2015   Procedure: Takedown ileostomy and repair of ostomy hernia, extensive entrolysis (2.5 hrs), ileostransverse colon anastomosis; closure of massive ventral hernia with 20 x 30 Strattice mesh;  Surgeon: Eric Hausen, MD;  [facility];  Service: General;  Laterality: N/A;   INCISIONAL HERNIA REPAIR  07/23/2015   Strattice mesh   IRRIGATION AND DEBRIDEMENT ABSCESS Right 09/09/2014    Procedure: MINOR INCISION AND DRAINAGE OF ABSCESS;  Surgeon: Eric Crumb, MD;  Location: [facility];  Service: Orthopedics;  Laterality: Right;  incision and drainage right long finger    IRRIGATION AND DEBRIDEMENT ABSCESS N/A 01/03/2017   Procedure: IRRIGATION AND DEBRIDEMENT ABDOMINAL WALL ABSCESS;  Surgeon: Eric Hausen, MD;  [facility];  Service: General;  Laterality: N/A;   LAPAROSCOPY N/A 07/23/2015   Procedure: LAPAROSCOPY DIAGNOSTIC;  Surgeon: Eric Hausen, MD;  [facility];  Service: General;  Laterality: N/A;   ORIF ANKLE FRACTURE Left 08/27/2014   Procedure:  open reduction internal fixation left ankle;  Surgeon: Eric Sells, MD;  Location: [facility];  Service: Orthopedics;  Laterality: Left;  Left open reduction internal fixation ankle   RIGHT URETEROSCOPIC STONE EXTRACITON  04-24-2005  & 12-01-2002   TOTAL HIP ARTHROPLASTY Left 08/07/2022   Procedure: TOTAL HIP ARTHROPLASTY ANTERIOR APPROACH;  Surgeon: Eric Leitz, MD;  [facility];  Service: Orthopedics;  Laterality: Left;    Social History:  reports that he quit smoking about 28 years ago. His smoking use included cigarettes. He has never used smokeless tobacco. He reports that he does not drink alcohol and does not use drugs.  Allergies:  Allergies  Allergen Reactions   Nsaids Other (See Comments)    Crohn's disease, told to avoid NSAIDs    Medications Prior to Admission  Medication Sig Dispense Refill   aspirin 81 MG chewable tablet Chew 1 tablet (81 mg total) by mouth daily.     atorvastatin (LIPITOR) 40 MG tablet Take 1 tablet (40 mg total) by mouth daily. 30 tablet 2   Cholecalciferol (VITAMIN D-3 PO) Take 1 capsule by mouth in the morning.     Cyanocobalamin (VITAMIN B-12 PO) Take 1 tablet by mouth in the morning.     diazePAM (VALTOCO 10 MG DOSE) 10 MG/0.1ML LIQD Place 10 mg into the nose as needed (seizure lasting greater than 5 minutes). 6 each 0   HUMIRA, 2 PEN, 40  MG/0.4ML PNKT Inject 40 mg into the skin every 14 (fourteen) days.     levETIRAcetam (KEPPRA) 750 MG tablet Take 1 tablet (750 mg total) by mouth 2 (two) times daily. 180 tablet 3   mesalamine (LIALDA) 1.2 g EC tablet Take 2.4 g by mouth 2 (two) times daily.     metFORMIN (GLUCOPHAGE) 500 MG tablet Take 500 mg by mouth 2 (two) times daily.     potassium chloride SA (KLOR-CON M) 20 MEQ tablet Take 2 tablets (40 mEq total) by mouth daily. 10 tablet 0   potassium citrate (UROCIT-K) 10 MEQ (1080 MG) SR tablet Take 40 mEq by mouth 4 (four) times daily.     tadalafil (CIALIS) 20 MG tablet Take 20 mg by mouth daily as needed for erectile dysfunction.     tamsulosin (FLOMAX) 0.4 MG CAPS capsule Take 1 capsule (0.4 mg total) by mouth daily after supper. (Patient taking differently: Take 0.4 mg by mouth in the morning.) 30 capsule 0   Trospium Chloride 60 MG CP24 Take 60 mg by mouth in the morning.  Physical Exam: Blood pressure 108/68, pulse 78, temperature 98.8 F (37.1 C), resp. rate 16, weight 76.2 kg, SpO2 97 %. General: black male, intubated and sedated HEENT: head -normocephalic, atraumatic; Eyes: PERRLA, anicteric sclerae Neck- Trachea is midline, no JVD appreciated.  CV- RRR, normal S1/S2, no M/R/G, pedal pulses 2+ BL Pulm- ventilated respirations Abd- soft, moderately distention, TTP RLQ, previous midline laparotomy scar with incisional hernia that is soft and temporarily reduces, chronic RUQ wound ~0.5 cm in size without active drainage.   OG - 400 mL documented in epic. OG currently not functioning. I attempted to flush air port as well as tubing and enteric contents are getting stuck between the NG tube and the wall tubing, despite trying multiple connectors.  GU- deferred  MSK- UE/LE symmetrical, no cyanosis, clubbing, or edema. Neuro- non focal exam, opens eyes to loud voice and follows commands.  Psych- unable to assess currently (intubated) Skin: warm and dry, no rashes or  lesions   Results for orders placed or performed during the hospital encounter of 12/24/22 (from the past 48 hour(s))  Lipase, blood     Status: None   Collection Time: 12/24/22  7:47 PM  Result Value Ref Range   Lipase 31 11 - 51 U/L    Comment: Performed at [facility], [place] [address], [place], [place] [address]  Comprehensive metabolic panel     Status: Abnormal   Collection Time: 12/24/22  7:47 PM  Result Value Ref Range   Sodium 139 135 - 145 mmol/L   Potassium 4.0 3.5 - 5.1 mmol/L   Chloride 102 98 - 111 mmol/L   CO2 25 22 - 32 mmol/L   Glucose, Bld 192 (H) 70 - 99 mg/dL    Comment: Glucose reference range applies only to samples taken after fasting for at least 8 hours.   BUN 11 8 - 23 mg/dL   Creatinine, Ser 1.03 0.61 - 1.24 mg/dL   Calcium 9.4 8.9 - 10.3 mg/dL   Total Protein 6.8 6.5 - 8.1 g/dL   Albumin 3.7 3.5 - 5.0 g/dL   AST 19 15 - 41 U/L   ALT 11 0 - 44 U/L   Alkaline Phosphatase 57 38 - 126 U/L   Total Bilirubin 0.8 0.3 - 1.2 mg/dL   GFR, Estimated >60 >60 mL/min    Comment: (NOTE) Calculated using the CKD-EPI Creatinine Equation (2021)    Anion gap 12 5 - 15    Comment: Performed at [facility] [address], [place], [place] [address]  CBC     Status: Abnormal   Collection Time: 12/24/22  7:47 PM  Result Value Ref Range   WBC 13.8 (H) 4.0 - 10.5 K/uL   RBC 5.35 4.22 - 5.81 MIL/uL   Hemoglobin 14.1 13.0 - 17.0 g/dL   HCT 44.6 39.0 - 52.0 %   MCV 83.4 80.0 - 100.0 fL   MCH 26.4 26.0 - 34.0 pg   MCHC 31.6 30.0 - 36.0 g/dL   RDW 15.6 (H) 11.5 - 15.5 %   Platelets 208 150 - 400 K/uL   nRBC 0.0 0.0 - 0.2 %    Comment: Performed at [facility], [place] [address], [place], [place] [address]  I-stat chem 8, ED (not at [facility], [facility] or [facility])     Status: Abnormal   Collection Time: 12/24/22  8:10 PM  Result Value Ref Range   Sodium 140 135 - 145 mmol/L   Potassium 4.0 3.5 -  5.1 mmol/L   Chloride 106 98 - 111 mmol/L   BUN 11 8 - 23 mg/dL    Creatinine, Ser 0.90 0.61 - 1.24 mg/dL   Glucose, Bld 188 (H) 70 - 99 mg/dL    Comment: Glucose reference range applies only to samples taken after fasting for at least 8 hours.   Calcium, Ion 1.13 (L) 1.15 - 1.40 mmol/L   TCO2 25 22 - 32 mmol/L   Hemoglobin 15.6 13.0 - 17.0 g/dL   HCT 46.0 39.0 - 52.0 %  I-Stat arterial blood gas, ED     Status: Abnormal   Collection Time: 12/24/22  9:16 PM  Result Value Ref Range   pH, Arterial 7.396 7.35 - 7.45   pCO2 arterial 42.7 32 - 48 mmHg   pO2, Arterial 545 (H) 83 - 108 mmHg   Bicarbonate 26.2 20.0 - 28.0 mmol/L   TCO2 27 22 - 32 mmol/L   O2 Saturation 100 %   Acid-Base Excess 1.0 0.0 - 2.0 mmol/L   Sodium 138 135 - 145 mmol/L   Potassium 3.7 3.5 - 5.1 mmol/L   Calcium, Ion 1.15 1.15 - 1.40 mmol/L   HCT 32.0 (L) 39.0 - 52.0 %   Hemoglobin 10.9 (L) 13.0 - 17.0 g/dL   Collection site RADIAL, ALLEN'S TEST ACCEPTABLE    Drawn by RT    Sample type ARTERIAL   Urinalysis, Routine w reflex microscopic -Urine, Clean Catch     Status: Abnormal   Collection Time: 12/24/22  9:44 PM  Result Value Ref Range   Color, Urine YELLOW YELLOW   APPearance CLEAR CLEAR   Specific Gravity, Urine 1.036 (H) 1.005 - 1.030   pH 8.0 5.0 - 8.0   Glucose, UA NEGATIVE NEGATIVE mg/dL   Hgb urine dipstick NEGATIVE NEGATIVE   Bilirubin Urine NEGATIVE NEGATIVE   Ketones, ur 5 (A) NEGATIVE mg/dL   Protein, ur 100 (A) NEGATIVE mg/dL   Nitrite NEGATIVE NEGATIVE   Leukocytes,Ua NEGATIVE NEGATIVE   RBC / HPF 0-5 0 - 5 RBC/hpf   WBC, UA 0-5 0 - 5 WBC/hpf   Bacteria, UA NONE SEEN NONE SEEN   Squamous Epithelial / HPF 0-5 0 - 5 /HPF   Mucus PRESENT     Comment: Performed at [facility], [place] [address], [place], [place] [address]  CBC     Status: Abnormal   Collection Time: 12/24/22 11:48 PM  Result Value Ref Range   WBC 11.5 (H) 4.0 - 10.5 K/uL   RBC 4.92 4.22 - 5.81 MIL/uL   Hemoglobin 13.1 13.0 - 17.0 g/dL   HCT 41.8 39.0 - 52.0 %   MCV 85.0 80.0 - 100.0  fL   MCH 26.6 26.0 - 34.0 pg   MCHC 31.3 30.0 - 36.0 g/dL   RDW 15.7 (H) 11.5 - 15.5 %   Platelets 187 150 - 400 K/uL   nRBC 0.0 0.0 - 0.2 %    Comment: Performed at [facility], [place] [address] [place], [place] [address]  Creatinine, serum     Status: None   Collection Time: 12/24/22 11:48 PM  Result Value Ref Range   Creatinine, Ser 1.09 0.61 - 1.24 mg/dL   GFR, Estimated >60 >60 mL/min    Comment: (NOTE) Calculated using the CKD-EPI Creatinine Equation (2021) Performed at [facility] [address], [place], [place] [address]   Magnesium     Status: Abnormal   Collection Time: 12/24/22 11:48 PM  Result  Value Ref Range   Magnesium 1.6 (L) 1.7 - 2.4 mg/dL    Comment: Performed at [facility] [address], [place], [place] [address]  Lactic acid, plasma     Status: Abnormal   Collection Time: 12/24/22 11:48 PM  Result Value Ref Range   Lactic Acid, Venous 2.5 (HH) 0.5 - 1.9 mmol/L    Comment: CRITICAL RESULT CALLED TO, READ BACK BY AND VERIFIED WITH Geannie Risen. 0052 12/25/22. LPAIT Performed at [facility], [place] [address], [place], [place] [address]   Procalcitonin     Status: None   Collection Time: 12/24/22 11:48 PM  Result Value Ref Range   Procalcitonin <0.10 ng/mL    Comment:        Interpretation: PCT (Procalcitonin) <= 0.5 ng/mL: Systemic infection (sepsis) is not likely. Local bacterial infection is possible. (NOTE)       Sepsis PCT Algorithm           Lower Respiratory Tract                                      Infection PCT Algorithm    ----------------------------     ----------------------------         PCT < 0.25 ng/mL                PCT < 0.10 ng/mL          Strongly encourage             Strongly discourage   discontinuation of antibiotics    initiation of antibiotics    ----------------------------     -----------------------------       PCT 0.25 - 0.50 ng/mL            PCT 0.10 - 0.25 ng/mL               OR       >80%  decrease in PCT            Discourage initiation of                                            antibiotics      Encourage discontinuation           of antibiotics    ----------------------------     -----------------------------         PCT >= 0.50 ng/mL              PCT 0.26 - 0.50 ng/mL               AND        <80% decrease in PCT             Encourage initiation of                                             antibiotics       Encourage continuation           of antibiotics    ----------------------------     -----------------------------        PCT >= 0.50 ng/mL  PCT > 0.50 ng/mL               AND         increase in PCT                  Strongly encourage                                      initiation of antibiotics    Strongly encourage escalation           of antibiotics                                     -----------------------------                                           PCT <= 0.25 ng/mL                                                 OR                                        > 80% decrease in PCT                                      Discontinue / Do not initiate                                             antibiotics  Performed at [facility], [address] [place], [place] [address]   Hemoglobin A1c     Status: Abnormal   Collection Time: 12/24/22 11:48 PM  Result Value Ref Range   Hgb A1c MFr Bld 7.2 (H) 4.8 - 5.6 %    Comment: (NOTE) Pre diabetes:          5.7%-6.4%  Diabetes:              >6.4%  Glycemic control for   <7.0% adults with diabetes    Mean Plasma Glucose 159.94 mg/dL    Comment: Performed at [facility] [address], [place], [place] [address]  Ammonia     Status: None   Collection Time: 12/24/22 11:48 PM  Result Value Ref Range   Ammonia 24 9 - 35 umol/L    Comment: Performed at [facility], [place] [address], [place], [place] [address]  Ethanol     Status: None   Collection Time: 12/24/22 11:48  PM  Result Value Ref Range   Alcohol, Ethyl (B) <10 <10 mg/dL    Comment: (NOTE) Lowest detectable limit for serum alcohol is 10 mg/dL.  For medical purposes only. Performed at [facility], [place] [address] [place], [place] [address]   Resp panel by RT-PCR (RSV, Flu  A&B, Covid) Anterior Nasal Swab     Status: None   Collection Time: 12/25/22 12:20 AM   Specimen: Anterior Nasal Swab  Result Value Ref Range   SARS Coronavirus 2 by RT PCR NEGATIVE NEGATIVE   Influenza A by PCR NEGATIVE NEGATIVE   Influenza B by PCR NEGATIVE NEGATIVE    Comment: (NOTE) The Xpert Xpress SARS-CoV-2/FLU/RSV plus assay is intended as an aid in the diagnosis of influenza from Nasopharyngeal swab specimens and should not be used as a sole basis for treatment. Nasal washings and aspirates are unacceptable for Xpert Xpress SARS-CoV-2/FLU/RSV testing.  Fact Sheet for Patients: EntrepreneurPulse.com.au  Fact Sheet for Healthcare Providers: IncredibleEmployment.be  This test is not yet approved or cleared by the [place] FDA and has been authorized for detection and/or diagnosis of SARS-CoV-2 by FDA under an Emergency Use Authorization (EUA). This EUA will remain in effect (meaning this test can be used) for the duration of the COVID-19 declaration under Section 564(b)(1) of the Act, 21 U.S.C. section 360bbb-3(b)(1), unless the authorization is terminated or revoked.     Resp Syncytial Virus by PCR NEGATIVE NEGATIVE    Comment: (NOTE) Fact Sheet for Patients: EntrepreneurPulse.com.au  Fact Sheet for Healthcare Providers: IncredibleEmployment.be  This test is not yet approved or cleared by the [place] FDA and has been authorized for detection and/or diagnosis of SARS-CoV-2 by FDA under an Emergency Use Authorization (EUA). This EUA will remain in effect (meaning this test can be used) for the duration of  the COVID-19 declaration under Section 564(b)(1) of the Act, 21 U.S.C. section 360bbb-3(b)(1), unless the authorization is terminated or revoked.  Performed at [facility], [place] [address] [place] [address] [place], [place] [address]   CBG monitoring, ED     Status: Abnormal   Collection Time: 12/25/22 12:23 AM  Result Value Ref Range   Glucose-Capillary 130 (H) 70 - 99 mg/dL    Comment: Glucose reference range applies only to samples taken after fasting for at least 8 hours.   Comment 1 Document in Chart   MRSA Next Gen by PCR, Nasal     Status: None   Collection Time: 12/25/22 12:46 AM   Specimen: Nasal Mucosa; Nasal Swab  Result Value Ref Range   MRSA by PCR Next Gen NOT DETECTED NOT DETECTED    Comment: (NOTE) The GeneXpert MRSA Assay (FDA approved for NASAL specimens only), is one component of a comprehensive MRSA colonization surveillance program. It is not intended to diagnose MRSA infection nor to guide or monitor treatment for MRSA infections. Test performance is not FDA approved in patients less than 66 years old. Performed at [facility], [place] [address] [place], [place] [address]   Glucose, capillary     Status: Abnormal   Collection Time: 12/25/22  3:16 AM  Result Value Ref Range   Glucose-Capillary 130 (H) 70 - 99 mg/dL    Comment: Glucose reference range applies only to samples taken after fasting for at least 8 hours.  Triglycerides     Status: Abnormal   Collection Time: 12/25/22  4:32 AM  Result Value Ref Range   Triglycerides 299 (H) <150 mg/dL    Comment: Performed at [facility] [address], [place], [place] [address]  Lactic acid, plasma     Status: Abnormal   Collection Time: 12/25/22  4:32 AM  Result Value Ref Range   Lactic Acid, Venous 2.9 (HH) 0.5 - 1.9 mmol/L    Comment: CRITICAL RESULT  CALLED TO, READ BACK BY AND VERIFIED WITH B. CUMMINGS,RN. [place] 12/25/22. LPAIT Performed at [facility], [place] [address] [place], [place] [address]    CBC     Status: Abnormal   Collection Time: 12/25/22  4:32 AM  Result Value Ref Range   WBC 8.5 4.0 - 10.5 K/uL   RBC 4.76 4.22 - 5.81 MIL/uL   Hemoglobin 12.6 (L) 13.0 - 17.0 g/dL   HCT 39.7 39.0 - 52.0 %   MCV 83.4 80.0 - 100.0 fL   MCH 26.5 26.0 - 34.0 pg   MCHC 31.7 30.0 - 36.0 g/dL   RDW 15.7 (H) 11.5 - 15.5 %   Platelets 171 150 - 400 K/uL   nRBC 0.0 0.0 - 0.2 %    Comment: Performed at [facility], [place] [address] [place], [place] [id number]  Basic metabolic panel     Status: Abnormal   Collection Time: 12/25/22  4:32 AM  Result Value Ref Range   Sodium 140 135 - 145 mmol/L   Potassium 4.3 3.5 - 5.1 mmol/L   Chloride 104 98 - 111 mmol/L   CO2 25 22 - 32 mmol/L   Glucose, Bld 152 (H) 70 - 99 mg/dL    Comment: Glucose reference range applies only to samples taken after fasting for at least 8 hours.   BUN 10 8 - 23 mg/dL   Creatinine, Ser 1.04 0.61 - 1.24 mg/dL   Calcium 8.6 (L) 8.9 - 10.3 mg/dL   GFR, Estimated >60 >60 mL/min    Comment: (NOTE) Calculated using the CKD-EPI Creatinine Equation (2021)    Anion gap 11 5 - 15    Comment: Performed at [facility] [address], [place], [place] [address]  Glucose, capillary     Status: Abnormal   Collection Time: 12/25/22  7:38 AM  Result Value Ref Range   Glucose-Capillary 166 (H) 70 - 99 mg/dL    Comment: Glucose reference range applies only to samples taken after fasting for at least 8 hours.   CT Angio Chest/Abd/Pel for Dissection W and/or Wo Contrast  Result Date: 12/24/2022 CLINICAL DATA:  Acute aortic syndrome suspected. Bowel obstruction. Abdominal pain. History of Crohn's disease. EXAM: CT ANGIOGRAPHY CHEST, ABDOMEN AND PELVIS TECHNIQUE: Initial noncontrast chest CT was performed axial only, from the lung apices to the adrenal glands. Multidetector CT imaging through the chest, abdomen and pelvis was performed using the standard protocol during bolus administration of intravenous contrast. Multiplanar  reconstructed images and MIPs were obtained and reviewed to evaluate the vascular anatomy. RADIATION DOSE REDUCTION: This exam was performed according to the departmental dose-optimization program which includes automated exposure control, adjustment of the mA and/or kV according to patient size and/or use of iterative reconstruction technique. CONTRAST:  100 mL Omnipaque 350 IV. COMPARISON:  No prior chest CT for comparison. Most recent chest x-ray was portable chest today, portable chest 11/29/2022. Comparison is made with abdomen and pelvis CTs with IV contrast from 11/29/2022 and 09/10/2022. FINDINGS: CTA CHEST FINDINGS Cardiovascular: There is mild cardiomegaly with a left chamber predominance. Minimal anterior pericardial effusion. There is scattered calcific plaque in the LAD coronary artery only. Left ventricular wall hypertrophy is seen except for a small portion of the apex which demonstrates marked wall thinning consistent with a small chronic apical infarct. The pulmonary veins are decompressed. Pulmonary arteries are normal caliber and centrally clear. The aorta is mildly tortuous. There is minimal scattered soft plaque in the descending  aorta with no other visible thoracic aortic plaques. There is no thoracic aortic aneurysm, dissection or stenosis. The great vessels are clear and branch normally. No wall calcification is seen. Mediastinum/Nodes: The patient is intubated. Tip of the ETT is 2.5 cm from the carina. NG tube loops around the stomach towards the left, the tip in the proximal upper stomach. The thoracic esophagus is unremarkable. The trachea is otherwise clear. No intrathoracic, axillary or supraclavicular adenopathy is seen. 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 On the right there is a heterogeneous nodule measuring 1.4 cm. Nonemergent thyroid ultrasound is recommended. The left sided mass displaces the trachea left of  midline but does not narrow it. Lungs/Pleura: There are trace pleural effusions. No pleural thickening or pneumothorax. The lungs are hypoexpanded. There is posterior basal opacity on both sides which could be atelectasis or combination of atelectasis and pneumonia. There are minimal paraseptal emphysematous changes in the extreme lung apices. Remaining lungs are clear. Musculoskeletal: Mild thoracic dextroscoliosis and degenerative change thoracic spine. 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 There is secondary DJD in the right shoulder. No destructive or aggressive bone lesion. The ribcage is intact. Review of the MIP images confirms the above findings. CTA ABDOMEN AND PELVIS FINDINGS VASCULAR Aorta: There are minimal scattered calcific plaques in the infrarenal aorta. There is no stenosis, dissection, or aneurysm or penetrating ulcer. Celiac: Normal. SMA: Normal. Renals: Normal. IMA: Normal. Inflow: Minimal calcification right common iliac artery. Remainder of the bilateral inflow vessels show no plaques. All inflow arteries are widely patent. The visualized outflow arteries are normal. Veins: Patent SVC. Other systemic veins and the portal veins are unopacified and not evaluated. 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 Review of the MIP images confirms the above findings. NON-VASCULAR Hepatobiliary: 18 cm in length liver with mild steatosis. No mass enhancement. Multiple cysts are again noted largest is 8 cm in the left lobe. Others are significantly smaller. Gallbladder and bile ducts are unremarkable. Pancreas: No abnormality. Spleen: No abnormality. Adrenals/Urinary Tract: There is no adrenal mass. Bilateral nephrolithiasis with scattered punctate up to 1 mm stones on the right, scattered 1-2 mm stones on the left, 1.4 cm stone in  the left upper pole and irregular 1 cm stone in the left lower pole. There are multiple bilateral too small to characterize hypodensities in both kidneys. No follow-up imaging is recommended [facility] 2018 Feb; 264-273, Management of the Incidental Renal Mass on CT, [employer] 2021; 814-848, Bosniak Classification of Cystic Renal Masses, Version 2019). No new cortical abnormality is seen. There is bilateral renal cortical thinning on the left-greater-than-right. Scattered small cortical scarring. Chronic pyelectasis on the left-greater-than-right is seen, without evidence of ureteral stones or ureteral dilatation. The bladder is poorly visualized due to spray artifact from a left hip arthroplasty, but unremarkable where seen. Stomach/Bowel: Mild fluid distention of the stomach. NGT in place as described above. Surgical changes of right hemicolectomy, distal small bowel resection and primary ileocolic surgical anastomosis are redemonstrated. There is a small defect in the right anterolateral mid abdominal wall at the level of the surgical anastomosis. A fluid-filled small caliber tubular structure extends through the defect to the skin surface, with surrounding skin thickening, unknown if this is a purposeful ileostomy or a fistula from nearby small bowel but  there did not seem to be communication with the skin surface on the prior studies. There is additional defect in the right lateral abdominal wall below this level with fat hernia, multilevel postsurgical changes to the more anterior abdominal wall and asymmetric right rectus atrophy and abdominal wall thinning. Again noted is wall thickening in the distal ileum extending up to the surgical anastomosis, but the inflammatory changes involving this loop (anterior right lower abdomen, series 14 axial [id number]) are not as pronounced as previously. There is increased dilatation of multiple upstream small bowel segments above this level as well as below this level up to  4.2 cm and multiple small bowel loops adherent to the anterior wall. A surgically widened segment of small bowel is also more dilated than previously, currently 7 cm anterior mid abdomen, previously 5.4 cm. The transition to decompressed caliber appears to be at the inferior aspect of the surgically widened segment on coronal reconstruction series 10 images 21-24, with the only normal caliber segment in this area being the inflamed distal ileal anastomotic segment. The gastric wall, duodenum and jejunum appear normal. The large bowel wall is normal thickness with scattered diverticulosis. Lymphatic: No adenopathy is seen. Reproductive: The prostate poorly visualized due to artifact from the left hip replacement but does measure enlarged at 4.9 cm. Other: No free fluid, free hemorrhage or free air. No incarcerated hernia. 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 Mild chronic compression fractures L1 and L2. No destructive bone lesion is seen. Review of the MIP images confirms the above findings. IMPRESSION: 1. No findings of aortic aneurysm or dissection. Minimal abdominal aortic and iliac atherosclerosis. 2. Cardiomegaly with left chamber predominance, small anterior pericardial effusion. 3. 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 4. Trace pleural effusions with bilateral posterior basal opacity which could be atelectasis, pneumonia or combination. 5. Bilateral thyroid nodules, largest on the left measuring 4.0 x 2.7 x 4.6 cm. Nonemergent thyroid ultrasound recommended. 6. Bilateral nephrolithiasis with chronic pyelectasis left-greater-than-right, but no ureteral stones or ureteral dilatation. 7. Surgical changes of right hemicolectomy, distal small bowel resection and primary ileocolic surgical anastomosis. 8. Small defect in the right anterolateral mid abdominal wall at the  level of the surgical anastomosis. There is a fluid-filled tubular structure extending through the defect to the skin surface, unknown if this is a purposeful ileostomy or a fistula from nearby small bowel. There did not seem to be communication with the skin surface on the prior studies. 9. Increased dilatation of multiple small bowel segments, with transition to decompressed caliber at the inferior aspect of the anterior mid abdominal surgically widened segment just above the thickened distal ileal anastomotic segment. Findings consistent with small-bowel obstruction probably related to distal ileitis. Inflammatory changes along the anastomotic segment are less prominent than previously. 10. Minimal apical emphysema. 11. Stable hepatic cysts. 12. Bilateral renal cortical thinning and stones. No obstructing stones. 13. Prostatomegaly. 14. Diverticulosis. Emphysema (ICD10-J43.9). Electronically Signed   By: Telford Nab M.D.   On: 12/24/2022 22:20   CT Head Wo Contrast  Result Date: 12/24/2022 CLINICAL DATA:  Mental status change, unknown cause EXAM: CT HEAD WITHOUT CONTRAST TECHNIQUE: Contiguous axial images were obtained from the base of the skull through the vertex without intravenous contrast. RADIATION DOSE REDUCTION: This exam was performed according to the departmental dose-optimization program which includes automated exposure control, adjustment of the mA and/or kV according to patient size and/or use  of iterative reconstruction technique. COMPARISON:  CT head 12/19/2022 FINDINGS: Brain: Stable possible chronic lacunar infarction of the right posterior limb of the internal capsule versus CSF space. No evidence of large-territorial acute infarction. No parenchymal hemorrhage. No mass lesion. No extra-axial collection. No mass effect or midline shift. No hydrocephalus. Basilar cisterns are patent. Vascular: No hyperdense vessel. Atherosclerotic calcifications are present within the cavernous internal  carotid and vertebral arteries. Skull: No acute fracture or focal lesion. Sinuses/Orbits: Paranasal sinuses and mastoid air cells are clear. The orbits are unremarkable. Other: None. IMPRESSION: No acute intracranial abnormality. Electronically Signed   By: Iven Finn M.D.   On: 12/24/2022 21:30   DG Abdomen 1 View  Result Date: 12/24/2022 CLINICAL DATA:  abd pain, ng tube EXAM: ABDOMEN - 1 VIEW COMPARISON:  X-ray abdomen 11/30/2022 FINDINGS: Enteric tube courses below the hemidiaphragm with tip and side port overlying the expected region of the gastric lumen. Large loop of enteric tube noted within the gastric lumen inferior to the hemidiaphragm. The bowel gas pattern is normal. No radio-opaque calculi or other significant radiographic abnormality are seen. IMPRESSION: Enteric tube in good position with large loop within the gastric lumen. Recommend retracting by 10 cm. Electronically Signed   By: Iven Finn M.D.   On: 12/24/2022 20:45   DG Chest Portable 1 View  Result Date: 12/24/2022 CLINICAL DATA:  ET tube placement. EXAM: PORTABLE CHEST 1 VIEW COMPARISON:  12/24/2022 at 8:16 p.m. FINDINGS: Endotracheal tube has been retracted, tip now projecting 3.2 cm above the carina. No other change. Nasal/orogastric tube is stable and well positioned. IMPRESSION: 1. Repositioned, well-positioned endotracheal tube. Electronically Signed   By: Lajean Manes M.D.   On: 12/24/2022 20:38   DG Chest Portable 1 View  Result Date: 12/24/2022 CLINICAL DATA:  ET tube placement. EXAM: PORTABLE CHEST 1 VIEW COMPARISON:  11/29/2022. FINDINGS: Endotracheal tube tip lies at the upper carina. Nasal/orogastric tube passes well below the diaphragm and into the stomach. Cardiac silhouette normal in size.  No mediastinal or hilar masses. Lung volumes are low. Mild opacity at the lung bases is consistent with atelectasis. Remainder of the lungs is clear. No convincing pleural effusion and no pneumothorax. IMPRESSION: 1.  Endotracheal tube tip projects at the upper carina. 2. Well-positioned nasal/orogastric tube. 3. Low lung volumes with mild basilar atelectasis. Electronically Signed   By: Lajean Manes M.D.   On: 12/24/2022 20:37      Assessment/Plan SBO due to distal ileitis in the setting of Crohn's disease - AFVSS, WBC 8 from 11  - complex surgical history, now with recurrent pSBO, nausea, vomiting. CT shows thickening of a segment of ileum proximal to his ileocolonic anastomosis, similar in appearance to prior imaging in January and October. - replace OG with NGT. place NG to LIWS  - recommend GI consult ([facility]) for medical management of Crohn's disease. Currently on solumedrol. ?humira and mesalamine at home. - no emergent surgical needs today. Surgery would be complex and high risk given his prior history - monitor chronic abdominal wound/fistula, if output increases we will have [place] RN place eakins pouch, both to protect the skin and monitor output.   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 Ok to D/C Zosyn and continue with mgmt of IBD per GI. Admit - ICU  I reviewed nursing notes, ED provider notes, hospitalist notes, last 24 h vitals and pain scores, last 48 h intake and output, last 24 h labs and trends, and last 24 h imaging results.  Jill Alexanders, [facility] 12/25/2022, 8:31 AM Please see Amion for pager number during day hours 7:00am-4:30pm or 7:00am -11:30am on weekends

## 2022-12-26 ENCOUNTER — Inpatient Hospital Stay (HOSPITAL_COMMUNITY): Payer: 59

## 2022-12-26 DIAGNOSIS — K56609 Unspecified intestinal obstruction, unspecified as to partial versus complete obstruction: Secondary | ICD-10-CM | POA: Diagnosis not present

## 2022-12-26 DIAGNOSIS — R109 Unspecified abdominal pain: Secondary | ICD-10-CM

## 2022-12-26 LAB — CBC
HCT: 35.1 % — ABNORMAL LOW (ref 39.0–52.0)
Hemoglobin: 11.1 g/dL — ABNORMAL LOW (ref 13.0–17.0)
MCH: 26.3 pg (ref 26.0–34.0)
MCHC: 31.6 g/dL (ref 30.0–36.0)
MCV: 83.2 fL (ref 80.0–100.0)
Platelets: 166 10*3/uL (ref 150–400)
RBC: 4.22 MIL/uL (ref 4.22–5.81)
RDW: 15.9 % — ABNORMAL HIGH (ref 11.5–15.5)
WBC: 7 10*3/uL (ref 4.0–10.5)
nRBC: 0 % (ref 0.0–0.2)

## 2022-12-26 LAB — MAGNESIUM: Magnesium: 2 mg/dL (ref 1.7–2.4)

## 2022-12-26 LAB — LEVETIRACETAM LEVEL: Levetiracetam Lvl: 13.9 ug/mL (ref 10.0–40.0)

## 2022-12-26 LAB — BASIC METABOLIC PANEL
Anion gap: 11 (ref 5–15)
BUN: 13 mg/dL (ref 8–23)
CO2: 20 mmol/L — ABNORMAL LOW (ref 22–32)
Calcium: 8 mg/dL — ABNORMAL LOW (ref 8.9–10.3)
Chloride: 107 mmol/L (ref 98–111)
Creatinine, Ser: 1.21 mg/dL (ref 0.61–1.24)
GFR, Estimated: >60 mL/min (ref >60)
Glucose, Bld: 87 mg/dL (ref 70–99)
Potassium: 4 mmol/L (ref 3.5–5.1)
Sodium: 138 mmol/L (ref 135–145)

## 2022-12-26 LAB — GLUCOSE, CAPILLARY
Glucose-Capillary: 130 mg/dL — ABNORMAL HIGH (ref 70–99)
Glucose-Capillary: 107 mg/dL — ABNORMAL HIGH (ref 70–99)
Glucose-Capillary: 109 mg/dL — ABNORMAL HIGH (ref 70–99)
Glucose-Capillary: 151 mg/dL — ABNORMAL HIGH (ref 70–99)
Glucose-Capillary: 119 mg/dL — ABNORMAL HIGH (ref 70–99)

## 2022-12-26 MED ORDER — DIATRIZOATE MEGLUMINE & SODIUM 66-10 % PO SOLN
90.0000 mL | Freq: Once | ORAL | Status: AC
  Administered 2022-12-26: 90 mL via NASOGASTRIC
  Filled 2022-12-26: qty 90

## 2022-12-26 MED ORDER — HYDRALAZINE HCL 20 MG/ML IJ SOLN: 10.0000 mg | INTRAMUSCULAR | Status: DC | PRN

## 2022-12-26 NOTE — Progress Notes (Signed)
Patient arrived to [place] room 28 alert and oriented x4. Patient transported to the bed from the wheelchair with one assist. NG clamped for KUB scheduled at 6:30p. Oxygen set up 2L [place]. Gauze on right side of patients abdomen clean dry and intact. Bed in lowest position. Call light in reach. Will continue to monitor pt.

## 2022-12-26 NOTE — Progress Notes (Signed)
Patient is a 67 year old male with pertinent PMH Crohn's disease with ileostomy in place, kidney stones, DMT2, recent seizures on keppra presents to [facility] ED on 3/11 with severe abdominal pain.  Patient currently intubated. TOC following.

## 2022-12-26 NOTE — Procedures (Signed)
Extubation Procedure Note  Patient Details:   Name: DEYLAN ECHELBARGER DOB: 1956-04-28 MRN: [id number]   Airway Documentation:    Vent end date: 12/26/22 Vent end time: 1119   Evaluation  O2 sats: stable throughout Complications: No apparent complications Patient did tolerate procedure well. Bilateral Breath Sounds: Clear, Diminished   Yes Pt was successfully extubated to 4L [place] with no apparent complications. Pt is able to speak and state name and no signs of stridor at this time. RT will continue to monitor as needed.  Felecia Jan 12/26/2022, 11:20 AM

## 2022-12-26 NOTE — Progress Notes (Signed)
NAME:  [name], MRN:  [id number], DOB:  [birth year], LOS: 2 ADMISSION DATE:  12/24/2022, CONSULTATION DATE:  12/24/2022 REFERRING MD:  Dr. Tomi Bamberger, CHIEF COMPLAINT:  abdominal pain   History of Present Illness:  Patient is a 67 year old male with pertinent PMH Crohn's disease with ileostomy in place, kidney stones, DMT2, recent seizures on keppra presents to [facility] ED on 3/11 with severe abdominal pain.   On 2/11 patient presented to [facility] ED with AMS, vomiting and abdominal distention.  Patient complaining of acute onset diffuse abdominal pain.  Upon arrival to ED patient moaning and crying in pain and not answering any questions.  Patient not able to lay still and altered.  BP 160/97 and afebrile.  Respiratory rate 20s.  Lipase 31.  UA unremarkable.  Patient intubated for diagnostics.  CT head no acute abnormality.  CTA chest/abdomen/pelvis trace pleural effusions with bilateral posterior basilar opacity possible atelectasis versus pneumonia; bilateral thyroid nodules; bilateral nephrolithiasis with chronic pyelectasis L greater than R but no ureteral stone/ureteral dilatation; increased dilatation of multiple small bowel segments consistent with small bowel obstruction probably related to distal ileitis.  GI and surgery consulted. PCCM consulted for ICU admission. 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 He remains on ventilator but has tolerated SBT well. Hopeful for extubation today.  Objective   Blood pressure 134/74, pulse 62, temperature 98.9 F (37.2 C), temperature source Axillary, resp. rate 20, weight 76.1 kg, SpO2 97 %.    Vent Mode: PRVC FiO2 (%):  [40 %] 40 % Set Rate:  [16 bmp] 16 bmp Vt Set:  [510 mL] 510 mL PEEP:  [5 cmH20] 5 cmH20 Pressure Support:  [8 cmH20] 8 cmH20 Plateau Pressure:  [14 cmH20-15 cmH20] 15 cmH20   Intake/Output Summary (Last 24 hours) at 12/26/2022 0804 Last data filed at 12/26/2022 0600 Gross per 24 hour  Intake 2836.73 ml  Output 975 ml  Net 1861.73 ml    Filed Weights   12/25/22 0053 12/25/22 0334 12/26/22 0509  Weight: 72.6 kg 76.2 kg 76.1 kg   Examination: Constitutional:Ill-appearing gentleman, in no acute distress. Cardio:Regular rate and rhythm. No murmurs, rubs, or gallops. Pulm:Clear to auscultation bilaterally. Normal work of breathing on nasal cannula. Abdomen:Firm, distended, with voluntary guarding and tenderness to palpation. Skin:Warm and dry. Neuro:Mildly sedated but will open eyes to voice and follow some commands.  Resolved Hospital Problem list   Acute respiratory failure  Assessment & Plan:   SBO 2/2 distal ileitis in setting of Crohn's disease VSS, afebrile. On exam his abdomen remains firm, distended, and tender to palpation with voluntary guarding. Blood culture with NGTD. GI and surgery consulted. NG tube remains on low-intermittent suction. Plan: -Follow GI, surgery recommendations -NG  tube with low intermittent suction -F/u blood and tracheal aspirate cultures -Continue IVF -Continue solumedrol 80 mg IV daily -Continue ileostomy care -Trend WBC, fever curve -Continue to hold home humira, mesalamine while NPO   Acute respiratory failure 2/2 encephalopathy, resolved Possible posterior basilar atelectasis  versus pneumonia Initially intubated due to AMS and need for airway protection during ongoing diagnotic work-up. Yesterday we attempted to wean his sedation and do SBT which he did not tolerate completely. He tolerated weaning and ultimate extubation this morning and is doing well on nasal cannula. I anticipate if he continues to do well, he will be stable for transfer out of ICU. Plan: -Monitor off of ventilator -Transfer out of ICU if remains stable this afternoon  Hx of seizures Plan: -Continue IV keppra 750 mg BID; transition to PO once diet is progressed/no longer on NGT with suction   DMT2 HbA1c 7.2%. Currently being managed with SSI. Plan: -Continue SSI, CBG monitoring   HLD Home regimen includes ASA, atorvastatin. Plan: -Hold home medications while NPO.   Bilateral thyroid nodules:  Incidentally noted on CT, largest nodule on left measuring 4.0 x 2.7 x 4.6 cm. Plan: -Recommend outpatient follow-up   Hx of BPH Documented UOP since admission 455 mL. None documented this shift. Plan: -Holding home flomax  Best Practice (right click and "Reselect all SmartList Selections" daily)   Diet/type: NPO DVT prophylaxis: LMWH GI prophylaxis: PPI, zofran Lines: N/A Foley:  No Code Status:  full code Last date of multidisciplinary goals of care discussion [Pending]  Labs   CBC: Recent Labs  Lab 12/19/22 1821 12/24/22 1947 12/24/22 2010 12/24/22 2116 12/24/22 2348 12/25/22 0432  WBC 7.1 13.8*  --   --  11.5* 8.5  NEUTROABS 5.1  --   --   --   --   --   HGB 11.4* 14.1 15.6 10.9* 13.1 12.6*  HCT 36.2* 44.6 46.0 32.0* 41.8 39.7  MCV 84.0 83.4  --   --  85.0 83.4  PLT 143* 208  --   --  187 171     Basic Metabolic Panel: Recent Labs  Lab 12/19/22 2233 12/24/22 1947 12/24/22 2010 12/24/22 2116 12/24/22 2348 12/25/22 0432  NA 141 139 140 138  --  140  K 2.8* 4.0 4.0 3.7  --  4.3  CL 109 102 106  --   --  104  CO2 21* 25  --   --   --  25  GLUCOSE 146* 192*  188*  --   --  152*  BUN 18 11 11  $ --   --  10  CREATININE 0.97 1.03 0.90  --  1.09 1.04  CALCIUM 8.8* 9.4  --   --   --  8.6*  MG  --   --   --   --  1.6*  --     GFR: Estimated Creatinine Clearance: 62.2 mL/min (by C-G formula based on SCr of 1.04 mg/dL). Recent Labs  Lab 12/19/22 1821 12/24/22 1947 12/24/22 2348 12/25/22 0432  PROCALCITON  --   --  <0.10  --   WBC 7.1 13.8* 11.5* 8.5  LATICACIDVEN  --   --  2.5* 2.9*     Liver Function Tests: Recent Labs  Lab 12/19/22 2233 12/24/22 1947  AST 17 19  ALT 11 11  ALKPHOS 41 57  BILITOT 0.4 0.8  PROT 5.4* 6.8  ALBUMIN 3.1* 3.7    Recent Labs  Lab 12/24/22 1947  LIPASE 31  Recent Labs  Lab 12/24/22 2348  AMMONIA 24     ABG    Component Value Date/Time   PHART 7.396 12/24/2022 2116   PCO2ART 42.7 12/24/2022 2116   PO2ART 545 (H) 12/24/2022 2116   HCO3 26.2 12/24/2022 2116   TCO2 27 12/24/2022 2116   O2SAT 100 12/24/2022 2116     Coagulation Profile: No results for input(s): "INR", "PROTIME" in the last 168 hours.  Cardiac Enzymes: No results for input(s): "CKTOTAL", "CKMB", "CKMBINDEX", "TROPONINI" in the last 168 hours.  HbA1C: Hgb A1c MFr Bld  Date/Time Value Ref Range Status  12/24/2022 11:48 PM 7.2 (H) 4.8 - 5.6 % Final    Comment:    (NOTE) Pre diabetes:          5.7%-6.4%  Diabetes:              >6.4%  Glycemic control for   <7.0% adults with diabetes   07/31/2022 11:06 AM 6.3 (H) 4.8 - 5.6 % Final    Comment:    (NOTE) Pre diabetes:          5.7%-6.4%  Diabetes:              >6.4%  Glycemic control for   <7.0% adults with diabetes     CBG: Recent Labs  Lab 12/25/22 1521 12/25/22 1920 12/25/22 2318 12/26/22 0328 12/26/22 0747  GLUCAP 123* 155* 132* 130* 107*     Review of Systems:   Negative unless otherwise stated.  Past Medical History:  He,  has a past medical history of Abdominal wall abscess (01/03/2017), Arthritis, Bladder stone, Cancer ([place]), Crohn's  disease ([place]), Crohn's disease with abscess ([place]) (07/23/2015), Diabetes mellitus without complication ([place]), History of aseptic necrosis of bone (BILATERAL HIPS), History of kidney stones, History of mycosis fungoides, and S/P ileostomy ([place]).   Surgical History:   Past Surgical History:  Procedure Laterality Date   BONE GRAFT OF LEFT HIP  11/13/1984   ASEPTIC NECROSIS   COLONOSCOPY     CYSTO/ BILATERAL RETROGRADE PYELOGRAM/ LEFT URETERAL STONE EXTRACTION / BILATERAL STENT PLACEMENT  10/07/2003   CYSTOSCOPY WITH LITHOLAPAXY  02/12/2012   Procedure: CYSTOSCOPY WITH LITHOLAPAXY;  Surgeon: Claybon Jabs, MD;  Location: [facility];  Service: Urology;  Laterality: N/A;   EXPL. LAP. W/ ENTEROLYSIS, RESECTION INFLAMMATORY MASS RLQ / TAKE-DOWN OF FISTULA WITH ENTEROENTEROSTOMY/ RESECTION WITH THE SMALL BOWEL TO ASCENDING COLON ANASTOMOSIS  10/29/2000   CROHN'S DISEASE W/ ANASTOMOTIC INFLAMMATORY MASS/    10-30-2000 EXPL. LAP. CONTROL POST-OP ABD. BLEEDING   EXPLORATORY LAPAROTOMY/ RESECTION OF ILEOCOLONIC ANASTOMOSIS AND CREATION OF ILEOSTOMY  11/04/2000   ABD. PERFORATION   HARDWARE REMOVAL Left 08/07/2022   Procedure: HARDWARE REMOVAL;  Surgeon: Dorna Leitz, MD;  Location: [facility];  Service: Orthopedics;  Laterality: Left;   [place] N/A 07/23/2015   Procedure: Takedown ileostomy and repair of ostomy hernia, extensive entrolysis (2.5 hrs), ileostransverse colon anastomosis; closure of massive ventral hernia with 20 x 30 Strattice mesh;  Surgeon: Johnathan Hausen, MD;  Location: [facility];  Service: General;  Laterality: N/A;   INCISIONAL HERNIA REPAIR  07/23/2015   Strattice mesh   IRRIGATION AND DEBRIDEMENT ABSCESS Right 09/09/2014   Procedure: MINOR INCISION AND DRAINAGE OF ABSCESS;  Surgeon: Charlotte Crumb, MD;  Location: [place];  Service: Orthopedics;  Laterality: Right;  incision and drainage right long  finger    IRRIGATION AND DEBRIDEMENT ABSCESS  N/A 01/03/2017   Procedure: IRRIGATION AND DEBRIDEMENT ABDOMINAL WALL ABSCESS;  Surgeon: Johnathan Hausen, MD;  Location: [facility];  Service: General;  Laterality: N/A;   LAPAROSCOPY N/A 07/23/2015   Procedure: LAPAROSCOPY DIAGNOSTIC;  Surgeon: Johnathan Hausen, MD;  Location: [facility];  Service: General;  Laterality: N/A;   ORIF ANKLE FRACTURE Left 08/27/2014   Procedure:  open reduction internal fixation left ankle;  Surgeon: Nita Sells, MD;  Location: [place];  Service: Orthopedics;  Laterality: Left;  Left open reduction internal fixation ankle   RIGHT URETEROSCOPIC STONE EXTRACITON  04-24-2005  & 12-01-2002   TOTAL HIP ARTHROPLASTY Left 08/07/2022   Procedure: TOTAL HIP ARTHROPLASTY ANTERIOR APPROACH;  Surgeon: Dorna Leitz, MD;  Location: [facility];  Service: Orthopedics;  Laterality: Left;     Social History:   reports that he quit smoking about 28 years ago. His smoking use included cigarettes. He has never used smokeless tobacco. He reports that he does not drink alcohol and does not use drugs.   Family History:  His family history includes Alzheimer's disease in his father; Cancer in his father and sister.   Allergies Allergies  Allergen Reactions   Nsaids Other (See Comments)    Crohn's disease, told to avoid NSAIDs     Home Medications  Prior to Admission medications   Medication Sig Start Date End Date Taking? Authorizing Provider  aspirin 81 MG chewable tablet Chew 1 tablet (81 mg total) by mouth daily. 12/02/22   [name], MD  atorvastatin (LIPITOR) 40 MG tablet Take 1 tablet (40 mg total) by mouth daily. 12/02/22   [name], MD  Cholecalciferol (VITAMIN D-3 PO) Take 1 capsule by mouth in the morning.    [provider]  Cyanocobalamin (VITAMIN B-12 PO) Take 1 tablet by mouth in the morning.    [provider]  diazePAM (VALTOCO 10 MG DOSE) 10 MG/0.1ML LIQD Place 10 mg into the nose as  needed (seizure lasting greater than 5 minutes). 12/02/22   [name], MD  HUMIRA, 2 PEN, 40 MG/0.4ML PNKT Inject 40 mg into the skin every 14 (fourteen) days. 11/17/22   [provider]  levETIRAcetam (KEPPRA) 750 MG tablet Take 1 tablet (750 mg total) by mouth 2 (two) times daily. 12/21/22   Marcial Pacas, MD  mesalamine (LIALDA) 1.2 g EC tablet Take 2.4 g by mouth 2 (two) times daily. 07/07/22   [provider]  metFORMIN (GLUCOPHAGE) 500 MG tablet Take 500 mg by mouth 2 (two) times daily. 05/23/21   [provider]  potassium chloride SA (KLOR-CON M) 20 MEQ tablet Take 2 tablets (40 mEq total) by mouth daily. 12/20/22   Horton, Barbette Hair, MD  potassium citrate (UROCIT-K) 10 MEQ (1080 MG) SR tablet Take 40 mEq by mouth 4 (four) times daily.    [provider]  tadalafil (CIALIS) 20 MG tablet Take 20 mg by mouth daily as needed for erectile dysfunction. 05/04/21   [provider]  tamsulosin (FLOMAX) 0.4 MG CAPS capsule Take 1 capsule (0.4 mg total) by mouth daily after supper. Patient taking differently: Take 0.4 mg by mouth in the morning. 01/12/17   Earnstine Regal, PA-C  Trospium Chloride 60 MG CP24 Take 60 mg by mouth in the morning. 06/04/22   [provider]     Critical care time: 35 minutes     Farrel Gordon, DO Internal Medicine PGY-2 [phone]

## 2022-12-26 NOTE — Progress Notes (Signed)
   12/26/22 0900  Adult Ventilator Settings  Vent Type Servo i  Humidity HME  Vent Mode PSV;CPAP  FiO2 (%) 40 %  Pressure Support 8 cmH20  PEEP 5 cmH20   Pt was placed on wean on above settings. Pt's ventilator keep alarming due to low Minute Ventilation.

## 2022-12-26 NOTE — Progress Notes (Signed)
[facility] Progress Note     Subjective: CC:  Alert on the vent. Spontaneous breathing trials this AM.  He shakes his hand so-so when I ask if he has pain. Denies flatus/BM.  Objective: Vital signs in last 24 hours: Temp:  [98.2 F (36.8 C)-99 F (37.2 C)] 98.6 F (37 C) (02/13 0900) Pulse Rate:  [58-91] 74 (02/13 0900) Resp:  [8-32] 14 (02/13 0900) BP: (106-165)/(69-96) 159/96 (02/13 0900) SpO2:  [96 %-100 %] 99 % (02/13 0900) FiO2 (%):  [40 %] 40 % (02/13 0748) Weight:  [76.1 kg] 76.1 kg (02/13 0509) Last BM Date :  (PTA)  Intake/Output from previous day: 02/12 0701 - 02/13 0700 In: 3107.6 [I.V.:2736.8; IV Piggyback:370.8] Out: 975 [Urine:625; Emesis/NG output:350] Intake/Output this shift: Total I/O In: 243.2 [I.V.:218.2; IV Piggyback:25] Out: 150 [Urine:125; Emesis/NG output:25]  PE: Gen:  Alert, on vent, NAD Card:  Regular rate and rhythm Pulm:  ventilated respirations, breathing over vent Abd: Soft, moderately distended, diffusely tender with guarding, no peritonitis.  Skin: warm and dry, no rashes  Psych: unable to assess   Lab Results:  Recent Labs    12/24/22 2348 12/25/22 0432  WBC 11.5* 8.5  HGB 13.1 12.6*  HCT 41.8 39.7  PLT 187 171   BMET Recent Labs    12/24/22 1947 12/24/22 2010 12/24/22 2116 12/24/22 2348 12/25/22 0432  NA 139 140 138  --  140  K 4.0 4.0 3.7  --  4.3  CL 102 106  --   --  104  CO2 25  --   --   --  25  GLUCOSE 192* 188*  --   --  152*  BUN 11 11  --   --  10  CREATININE 1.03 0.90  --  1.09 1.04  CALCIUM 9.4  --   --   --  8.6*   PT/INR No results for input(s): "LABPROT", "INR" in the last 72 hours. CMP     Component Value Date/Time   NA 140 12/25/2022 0432   K 4.3 12/25/2022 0432   CL 104 12/25/2022 0432   CO2 25 12/25/2022 0432   GLUCOSE 152 (H) 12/25/2022 0432   BUN 10 12/25/2022 0432   CREATININE 1.04 12/25/2022 0432   CALCIUM 8.6 (L) 12/25/2022 0432   PROT 6.8 12/24/2022 1947   ALBUMIN  3.7 12/24/2022 1947   AST 19 12/24/2022 1947   ALT 11 12/24/2022 1947   ALKPHOS 57 12/24/2022 1947   BILITOT 0.8 12/24/2022 1947   GFRNONAA >60 12/25/2022 0432   GFRAA >60 01/12/2017 1239   Lipase     Component Value Date/Time   LIPASE 31 12/24/2022 1947       Studies/Results: DG Abd 1 View  Result Date: 12/25/2022 CLINICAL DATA:  Nasogastric tube placement. EXAM: ABDOMEN - 1 VIEW COMPARISON:  December 24, 2022. FINDINGS: Distal tip of nasogastric tube is seen in expected position of distal stomach. IMPRESSION: Distal tip of nasogastric tube is seen in expected position of distal stomach. Electronically Signed   By: Marijo Conception M.D.   On: 12/25/2022 10:32   CT Angio Chest/Abd/Pel for Dissection W and/or Wo Contrast  Result Date: 12/24/2022 CLINICAL DATA:  Acute aortic syndrome suspected. Bowel obstruction. Abdominal pain. History of Crohn's disease. EXAM: CT ANGIOGRAPHY CHEST, ABDOMEN AND PELVIS TECHNIQUE: Initial noncontrast chest CT was performed axial only, from the lung apices to the adrenal glands. Multidetector CT imaging through the chest, abdomen and pelvis was performed using the standard  protocol during bolus administration of intravenous contrast. Multiplanar reconstructed images and MIPs were obtained and reviewed to evaluate the vascular anatomy. RADIATION DOSE REDUCTION: This exam was performed according to the departmental dose-optimization program which includes automated exposure control, adjustment of the mA and/or kV according to patient size and/or use of iterative reconstruction technique. CONTRAST:  100 mL Omnipaque 350 IV. COMPARISON:  No prior chest CT for comparison. Most recent chest x-ray was portable chest today, portable chest 11/29/2022. Comparison is made with abdomen and pelvis CTs with IV contrast from 11/29/2022 and 09/10/2022. FINDINGS: CTA CHEST FINDINGS Cardiovascular: There is mild cardiomegaly with a left chamber predominance. Minimal anterior  pericardial effusion. There is scattered calcific plaque in the LAD coronary artery only. Left ventricular wall hypertrophy is seen except for a small portion of the apex which demonstrates marked wall thinning consistent with a small chronic apical infarct. The pulmonary veins are decompressed. Pulmonary arteries are normal caliber and centrally clear. The aorta is mildly tortuous. There is minimal scattered soft plaque in the descending aorta with no other visible thoracic aortic plaques. There is no thoracic aortic aneurysm, dissection or stenosis. The great vessels are clear and branch normally. No wall calcification is seen. Mediastinum/Nodes: The patient is intubated. Tip of the ETT is 2.5 cm from the carina. NG tube loops around the stomach towards the left, the tip in the proximal upper stomach. The thoracic esophagus is unremarkable. The trachea is otherwise clear. No intrathoracic, axillary or supraclavicular adenopathy is seen. 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 On the right there is a heterogeneous nodule measuring 1.4 cm. Nonemergent thyroid ultrasound is recommended. The left sided mass displaces the trachea left of midline but does not narrow it. Lungs/Pleura: There are trace pleural effusions. No pleural thickening or pneumothorax. The lungs are hypoexpanded. There is posterior basal opacity on both sides which could be atelectasis or combination of atelectasis and pneumonia. There are minimal paraseptal emphysematous changes in the extreme lung apices. Remaining lungs are clear. Musculoskeletal: Mild thoracic dextroscoliosis and degenerative change thoracic spine. 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 There is secondary DJD in the right shoulder. No destructive or aggressive bone lesion. The ribcage is intact.  Review of the MIP images confirms the above findings. CTA ABDOMEN AND PELVIS FINDINGS VASCULAR Aorta: There are minimal scattered calcific plaques in the infrarenal aorta. There is no stenosis, dissection, or aneurysm or penetrating ulcer. Celiac: Normal. SMA: Normal. Renals: Normal. IMA: Normal. Inflow: Minimal calcification right common iliac artery. Remainder of the bilateral inflow vessels show no plaques. All inflow arteries are widely patent. The visualized outflow arteries are normal. Veins: Patent SVC. Other systemic veins and the portal veins are unopacified and not evaluated. 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 Review of the MIP images confirms the above findings. NON-VASCULAR Hepatobiliary: 18 cm in length liver with mild steatosis. No mass enhancement. Multiple cysts are again noted largest is 8 cm in the left lobe. Others are significantly smaller. Gallbladder and bile ducts are unremarkable. Pancreas: No abnormality. Spleen: No abnormality. Adrenals/Urinary Tract: There is no adrenal mass. Bilateral nephrolithiasis with scattered punctate up to 1 mm stones on the right, scattered 1-2 mm stones on the left, 1.4 cm stone in the left upper pole and irregular 1 cm stone  in the left lower pole. There are multiple bilateral too small to characterize hypodensities in both kidneys. No follow-up imaging is recommended [facility] 2018 Feb; 264-273, Management of the Incidental Renal Mass on CT, [employer] 2021; 814-848, Bosniak Classification of Cystic Renal Masses, Version 2019). No new cortical abnormality is seen. There is bilateral renal cortical thinning on the left-greater-than-right. Scattered small cortical scarring. Chronic pyelectasis on the left-greater-than-right is seen, without evidence of ureteral stones or ureteral dilatation. The bladder is poorly visualized due to spray artifact from a left hip arthroplasty, but unremarkable where  seen. Stomach/Bowel: Mild fluid distention of the stomach. NGT in place as described above. Surgical changes of right hemicolectomy, distal small bowel resection and primary ileocolic surgical anastomosis are redemonstrated. There is a small defect in the right anterolateral mid abdominal wall at the level of the surgical anastomosis. A fluid-filled small caliber tubular structure extends through the defect to the skin surface, with surrounding skin thickening, unknown if this is a purposeful ileostomy or a fistula from nearby small bowel but there did not seem to be communication with the skin surface on the prior studies. There is additional defect in the right lateral abdominal wall below this level with fat hernia, multilevel postsurgical changes to the more anterior abdominal wall and asymmetric right rectus atrophy and abdominal wall thinning. Again noted is wall thickening in the distal ileum extending up to the surgical anastomosis, but the inflammatory changes involving this loop (anterior right lower abdomen, series 14 axial [id number]) are not as pronounced as previously. There is increased dilatation of multiple upstream small bowel segments above this level as well as below this level up to 4.2 cm and multiple small bowel loops adherent to the anterior wall. A surgically widened segment of small bowel is also more dilated than previously, currently 7 cm anterior mid abdomen, previously 5.4 cm. The transition to decompressed caliber appears to be at the inferior aspect of the surgically widened segment on coronal reconstruction series 10 images 21-24, with the only normal caliber segment in this area being the inflamed distal ileal anastomotic segment. The gastric wall, duodenum and jejunum appear normal. The large bowel wall is normal thickness with scattered diverticulosis. Lymphatic: No adenopathy is seen. Reproductive: The prostate poorly visualized due to artifact from the left hip replacement but does  measure enlarged at 4.9 cm. Other: No free fluid, free hemorrhage or free air. No incarcerated hernia. 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 Mild chronic compression fractures L1 and L2. No destructive bone lesion is seen. Review of the MIP images confirms the above findings. IMPRESSION: 1. No findings of aortic aneurysm or dissection. Minimal abdominal aortic and iliac atherosclerosis. 2. Cardiomegaly with left chamber predominance, small anterior pericardial effusion. 3. 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 4. Trace pleural effusions with bilateral posterior basal opacity which could be atelectasis, pneumonia or combination. 5. Bilateral thyroid nodules, largest on the left measuring 4.0 x 2.7 x 4.6 cm. Nonemergent thyroid ultrasound recommended. 6. Bilateral nephrolithiasis with chronic pyelectasis left-greater-than-right, but no ureteral stones or ureteral dilatation. 7. Surgical changes of right hemicolectomy, distal small bowel resection and primary ileocolic surgical anastomosis. 8. Small defect in the right anterolateral mid abdominal wall at the level of the surgical anastomosis. There is a fluid-filled tubular structure extending through the defect to the skin surface, unknown if this is a purposeful ileostomy or a fistula from nearby small bowel.  There did not seem to be communication with the skin surface on the prior studies. 9. Increased dilatation of multiple small bowel segments, with transition to decompressed caliber at the inferior aspect of the anterior mid abdominal surgically widened segment just above the thickened distal ileal anastomotic segment. Findings consistent with small-bowel obstruction probably related to distal ileitis. Inflammatory changes along the anastomotic segment are less prominent than previously. 10. Minimal apical emphysema. 11.  Stable hepatic cysts. 12. Bilateral renal cortical thinning and stones. No obstructing stones. 13. Prostatomegaly. 14. Diverticulosis. Emphysema (ICD10-J43.9). Electronically Signed   By: Telford Nab M.D.   On: 12/24/2022 22:20   CT Head Wo Contrast  Result Date: 12/24/2022 CLINICAL DATA:  Mental status change, unknown cause EXAM: CT HEAD WITHOUT CONTRAST TECHNIQUE: Contiguous axial images were obtained from the base of the skull through the vertex without intravenous contrast. RADIATION DOSE REDUCTION: This exam was performed according to the departmental dose-optimization program which includes automated exposure control, adjustment of the mA and/or kV according to patient size and/or use of iterative reconstruction technique. COMPARISON:  CT head 12/19/2022 FINDINGS: Brain: Stable possible chronic lacunar infarction of the right posterior limb of the internal capsule versus CSF space. No evidence of large-territorial acute infarction. No parenchymal hemorrhage. No mass lesion. No extra-axial collection. No mass effect or midline shift. No hydrocephalus. Basilar cisterns are patent. Vascular: No hyperdense vessel. Atherosclerotic calcifications are present within the cavernous internal carotid and vertebral arteries. Skull: No acute fracture or focal lesion. Sinuses/Orbits: Paranasal sinuses and mastoid air cells are clear. The orbits are unremarkable. Other: None. IMPRESSION: No acute intracranial abnormality. Electronically Signed   By: Iven Finn M.D.   On: 12/24/2022 21:30   DG Abdomen 1 View  Result Date: 12/24/2022 CLINICAL DATA:  abd pain, ng tube EXAM: ABDOMEN - 1 VIEW COMPARISON:  X-ray abdomen 11/30/2022 FINDINGS: Enteric tube courses below the hemidiaphragm with tip and side port overlying the expected region of the gastric lumen. Large loop of enteric tube noted within the gastric lumen inferior to the hemidiaphragm. The bowel gas pattern is normal. No radio-opaque calculi or other  significant radiographic abnormality are seen. IMPRESSION: Enteric tube in good position with large loop within the gastric lumen. Recommend retracting by 10 cm. Electronically Signed   By: Iven Finn M.D.   On: 12/24/2022 20:45   DG Chest Portable 1 View  Result Date: 12/24/2022 CLINICAL DATA:  ET tube placement. EXAM: PORTABLE CHEST 1 VIEW COMPARISON:  12/24/2022 at 8:16 p.m. FINDINGS: Endotracheal tube has been retracted, tip now projecting 3.2 cm above the carina. No other change. Nasal/orogastric tube is stable and well positioned. IMPRESSION: 1. Repositioned, well-positioned endotracheal tube. Electronically Signed   By: Lajean Manes M.D.   On: 12/24/2022 20:38   DG Chest Portable 1 View  Result Date: 12/24/2022 CLINICAL DATA:  ET tube placement. EXAM: PORTABLE CHEST 1 VIEW COMPARISON:  11/29/2022. FINDINGS: Endotracheal tube tip lies at the upper carina. Nasal/orogastric tube passes well below the diaphragm and into the stomach. Cardiac silhouette normal in size.  No mediastinal or hilar masses. Lung volumes are low. Mild opacity at the lung bases is consistent with atelectasis. Remainder of the lungs is clear. No convincing pleural effusion and no pneumothorax. IMPRESSION: 1. Endotracheal tube tip projects at the upper carina. 2. Well-positioned nasal/orogastric tube. 3. Low lung volumes with mild basilar atelectasis. Electronically Signed   By: Lajean Manes M.D.   On: 12/24/2022 20:37    Anti-infectives: Anti-infectives (From admission, onward)  Start     Dose/Rate Route Frequency Ordered Stop   12/25/22 0000  piperacillin-tazobactam (ZOSYN) IVPB 3.375 g        3.375 g 12.5 mL/hr over 240 Minutes Intravenous Every 8 hours 12/24/22 2358 12/26/22 2359        Assessment/Plan  SBO due to distal ileitis in the setting of Crohn's disease - AFVSS, AM labs pending  - complex surgical history, now with recurrent pSBO. CT shows thickening of a segment of ileum proximal to his  ileocolonic anastomosis, similar in appearance to prior imaging in January and October. - continue NG to LIWS, check KUB today given increase in abd pain.  - GI consulted yesterday and signed off - gave steroid recommendations  - no emergent surgical needs today. Surgery would be complex and high risk given his prior history - monitor chronic abdominal wound/fistula, if output increases we will have [place] RN place eakins pouch, both to protect the skin and monitor output.    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 Ok to D/C Zosyn and continue with mgmt of IBD per GI. Admit - ICU    LOS: 2 days   I reviewed nursing notes, Consultant GI notes, hospitalist notes, last 24 h vitals and pain scores, last 48 h intake and output, last 24 h labs and trends, and last 24 h imaging results.  This care required moderate level of medical decision making.   Obie Dredge, PA-C [facility] Please see Amion for pager number during day hours 7:00am-4:30pm

## 2022-12-27 ENCOUNTER — Inpatient Hospital Stay (HOSPITAL_COMMUNITY): Payer: 59

## 2022-12-27 DIAGNOSIS — K56609 Unspecified intestinal obstruction, unspecified as to partial versus complete obstruction: Secondary | ICD-10-CM | POA: Diagnosis not present

## 2022-12-27 LAB — GLUCOSE, CAPILLARY
Glucose-Capillary: 107 mg/dL — ABNORMAL HIGH (ref 70–99)
Glucose-Capillary: 93 mg/dL (ref 70–99)
Glucose-Capillary: 97 mg/dL (ref 70–99)
Glucose-Capillary: 102 mg/dL — ABNORMAL HIGH (ref 70–99)
Glucose-Capillary: 198 mg/dL — ABNORMAL HIGH (ref 70–99)
Glucose-Capillary: 264 mg/dL — ABNORMAL HIGH (ref 70–99)

## 2022-12-27 LAB — BASIC METABOLIC PANEL
Anion gap: 7 (ref 5–15)
BUN: 16 mg/dL (ref 8–23)
CO2: 23 mmol/L (ref 22–32)
Calcium: 8.1 mg/dL — ABNORMAL LOW (ref 8.9–10.3)
Chloride: 110 mmol/L (ref 98–111)
Creatinine, Ser: 1.14 mg/dL (ref 0.61–1.24)
GFR, Estimated: >60 mL/min (ref >60)
Glucose, Bld: 111 mg/dL — ABNORMAL HIGH (ref 70–99)
Potassium: 3.9 mmol/L (ref 3.5–5.1)
Sodium: 140 mmol/L (ref 135–145)

## 2022-12-27 LAB — CBC
HCT: 35.4 % — ABNORMAL LOW (ref 39.0–52.0)
Hemoglobin: 11.2 g/dL — ABNORMAL LOW (ref 13.0–17.0)
MCH: 26.4 pg (ref 26.0–34.0)
MCHC: 31.6 g/dL (ref 30.0–36.0)
MCV: 83.5 fL (ref 80.0–100.0)
Platelets: 146 10*3/uL — ABNORMAL LOW (ref 150–400)
RBC: 4.24 MIL/uL (ref 4.22–5.81)
RDW: 15.4 % (ref 11.5–15.5)
WBC: 6.6 10*3/uL (ref 4.0–10.5)
nRBC: 0 % (ref 0.0–0.2)

## 2022-12-27 MED ORDER — ACETAMINOPHEN 500 MG PO TABS
1000.0000 mg | ORAL_TABLET | ORAL | Status: AC | PRN
  Administered 2022-12-27: 1000 mg via ORAL
  Filled 2022-12-27: qty 2

## 2022-12-27 MED ORDER — INSULIN ASPART 100 UNIT/ML IJ SOLN
0.0000 [IU] | Freq: Three times a day (TID) | INTRAMUSCULAR | Status: DC
  Administered 2022-12-27: 2 [IU] via SUBCUTANEOUS
  Administered 2022-12-28: 5 [IU] via SUBCUTANEOUS

## 2022-12-27 MED ORDER — LACTATED RINGERS IV SOLN: INTRAVENOUS | Status: DC

## 2022-12-27 NOTE — Progress Notes (Signed)
[facility] Abigail Butts NP notified via chat that patient has 7/10 headache and no pain management. New orders received

## 2022-12-27 NOTE — Progress Notes (Signed)
Upon assessment,   patient  in bed, alert and oriented, in NAD, VSS. On 2 L [place], no labor breathing noted. SR on tele.  NGT in left nare, clamped at time, waiting for kub to be done. Abd firm and distended, Gauze on rt side abdomen ( old ileostomy site) clean, dry and intact. Xray was called, KUB will be done at 01:30 am, patient made aware.    Bedsside report given to oncoming/relief RN Patti.

## 2022-12-27 NOTE — Progress Notes (Signed)
Ngt came out while ambulating, Denies nausea, Large mushy brown BM. Clarene Essex notified.

## 2022-12-27 NOTE — Progress Notes (Signed)
[facility] Progress Note     Subjective: CC:  Small BM overnight and feeling less bloated. No abd pain. No n/v. NGT out  Objective: Vital signs in last 24 hours: Temp:  [97.6 F (36.4 C)-98.6 F (37 C)] 97.8 F (36.6 C) (02/14 0737) Pulse Rate:  [58-85] 61 (02/14 0737) Resp:  [9-22] 16 (02/14 0737) BP: (123-166)/(72-132) 128/78 (02/14 0737) SpO2:  [94 %-100 %] 98 % (02/14 0737) FiO2 (%):  [40 %] 40 % (02/13 1119) Weight:  [76 kg] 76 kg (02/14 0500) Last BM Date : 12/27/22  Intake/Output from previous day: 02/13 0701 - 02/14 0700 In: 1228.9 [I.V.:1094.7; IV Piggyback:134.2] Out: [id number]; Emesis/NG output:350] Intake/Output this shift: No intake/output data recorded.  PE: Gen:  Alert, laying comfortably in bed in NAD Card:  Regular rate and rhythm Pulm:  respiratory effort nonlabored on room air Abd: Soft, nondistended, NT. RLQ fistula with scant drainage on bandage  Skin: warm and dry, no rashes  Psych: unable to assess   Lab Results:  Recent Labs    12/26/22 0819 12/27/22 0052  WBC 7.0 6.6  HGB 11.1* 11.2*  HCT 35.1* 35.4*  PLT 166 146*    BMET Recent Labs    12/26/22 0819 12/27/22 0052  NA 138 140  K 4.0 3.9  CL 107 110  CO2 20* 23  GLUCOSE 87 111*  BUN 13 16  CREATININE 1.21 1.14  CALCIUM 8.0* 8.1*    PT/INR No results for input(s): "LABPROT", "INR" in the last 72 hours. CMP     Component Value Date/Time   NA 140 12/27/2022 0052   K 3.9 12/27/2022 0052   CL 110 12/27/2022 0052   CO2 23 12/27/2022 0052   GLUCOSE 111 (H) 12/27/2022 0052   BUN 16 12/27/2022 0052   CREATININE 1.14 12/27/2022 0052   CALCIUM 8.1 (L) 12/27/2022 0052   PROT 6.8 12/24/2022 1947   ALBUMIN 3.7 12/24/2022 1947   AST 19 12/24/2022 1947   ALT 11 12/24/2022 1947   ALKPHOS 57 12/24/2022 1947   BILITOT 0.8 12/24/2022 1947   GFRNONAA >60 12/27/2022 0052   GFRAA >60 01/12/2017 1239   Lipase     Component Value Date/Time   LIPASE 31  12/24/2022 1947       Studies/Results: DG Abd Portable 1V-Small Bowel Obstruction Protocol-initial, 8 hr delay  Result Date: 12/27/2022 CLINICAL DATA:  Small-bowel obstruction. EXAM: PORTABLE ABDOMEN - 1 VIEW COMPARISON:  12/26/2022. FINDINGS: The bowel gas pattern is normal. Contrast is present in the colon. Scattered diverticula are present along the distal transverse colon. Stones are present in the left kidney. Total hip arthroplasty changes are present on the left. IMPRESSION: 1. No evidence of bowel obstruction. 2. Colonic diverticulosis. 3. Left renal calculi. Electronically Signed   By: Brett Fairy M.D.   On: 12/27/2022 02:07   DG Abd Portable 1V  Result Date: 12/26/2022 CLINICAL DATA:  Abdominal pain, small-bowel obstruction EXAM: PORTABLE ABDOMEN - 1 VIEW COMPARISON:  12/25/2022 abdominal radiograph FINDINGS: Enteric tube terminates in the proximal stomach. Relatively gasless abdomen with similar gas-filled bowel loop overlying the sacrum, favor colorectal gas. No evidence of pneumatosis or pneumoperitoneum. Upper left renal 12 mm and lower left renal 6 mm stones. Left total hip arthroplasty is partially visualized. IMPRESSION: Enteric tube terminates in the proximal stomach. Similar relatively gasless abdomen with gas-filled bowel loop overlying the sacrum, favor colorectal gas. No evidence of pneumatosis or pneumoperitoneum. Left nephrolithiasis. Electronically Signed   By: Ilona Sorrel  M.D.   On: 12/26/2022 10:38   DG Abd 1 View  Result Date: 12/25/2022 CLINICAL DATA:  Nasogastric tube placement. EXAM: ABDOMEN - 1 VIEW COMPARISON:  December 24, 2022. FINDINGS: Distal tip of nasogastric tube is seen in expected position of distal stomach. IMPRESSION: Distal tip of nasogastric tube is seen in expected position of distal stomach. Electronically Signed   By: Marijo Conception M.D.   On: 12/25/2022 10:32    Anti-infectives: Anti-infectives (From admission, onward)    Start     Dose/Rate  Route Frequency Ordered Stop   12/25/22 0000  piperacillin-tazobactam (ZOSYN) IVPB 3.375 g  Status:  Discontinued        3.375 g 12.5 mL/hr over 240 Minutes Intravenous Every 8 hours 12/24/22 2358 12/26/22 1315        Assessment/Plan  SBO due to distal ileitis in the setting of Crohn's disease - AFVSS - complex surgical history, now with recurrent pSBO. CT shows thickening of a segment of ileum proximal to his ileocolonic anastomosis, similar in appearance to prior imaging in January and October. - GI consulted and signed off - gave steroid recommendations  - no emergent surgical needs. Surgery would be complex and high risk given his prior history and fortunately SBO is resolving - contrast on 8 hour film and BM this am and has had BM. NGT already out. Start CLD and ADAT - monitor chronic abdominal wound/fistula, if output increases will have [place] RN place eakins pouch, both to protect the skin and monitor output.    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 Ok to D/C Zosyn and continue with mgmt of IBD per GI.    LOS: 3 days   I reviewed nursing notes, Consultant GI notes, hospitalist notes, last 24 h vitals and pain scores, last 48 h intake and output, last 24 h labs and trends, and last 24 h imaging results.  This care required moderate level of medical decision making.   Winferd Humphrey, [facility] 12/27/2022, 7:51 AM Please see Amion for pager number during day hours 7:00am-4:30pm

## 2022-12-27 NOTE — Progress Notes (Signed)
PROGRESS NOTE    [name]  [id number] DOB: 1955-12-30 DOA: 12/24/2022 PCP: [name], MD    Brief Narrative:   [name] is a 67 y.o. male with past medical history significant for Crohn's disease with ileostomy, nephrolithiasis, type 2 diabetes mellitus, seizure disorder on Keppra who presented to [facility] ED on 2/11 with severe abdominal pain and confusion.  On arrival to the ED, patient was moaning, crying in pain and not answering questions.  Patient inability to lay still.  BP 160/97 and afebrile.  Respiratory rate in the 20s.  Lipase 31.  Urinalysis unremarkable.  Patient was intubated for diagnostics.  CT head with no acute abnormality.  CTA chest/abdomen/pelvis with trace pleural effusions, bilateral posterior basilar opacity, possible atelectasis versus pneumonia, bilateral thyroid nodules, bilateral nephrolithiasis with chronic with chronic pyelectasis left greater than right but no ureteral stone/ureteral dilation, increased dilation of multiple small bowel segments consistent with small bowel obstruction related to distal ileitis.  Patient was started on antibiotics.  GI and general surgery consulted.  Patient was initially admitted to the [facility] service.  Significant Hospital events: 2/11: Admit, admitted to the PCCM service 2/12: Seen by GI; recommended IV steroids and transition to prednisone 40 mg daily on discharge with outpatient follow-up with Dr. Alessandra Bevels 2/13: Extubated 2/14: Transferred to [place], general surgery starting clear liquid diet   Assessment & Plan:   Small bowel obstruction 2/2 distal ileitis in the setting of Crohn's disease Patient presenting to ED with progressive abdominal pain.  CT abdomen/pelvis with increased dilation of multiple small bowel segments consistent with small bowel obstruction related to distal ileitis.  Patient was initially admitted to the intensive care unit, started on IV Zosyn and NG tube placed to intermittent suction.   Patient was seen by GI who recommended IV steroids with transition to prednisone on discharge and outpatient follow-up with Dr. Alessandra Bevels.  Patient was started on small bowel protocol with repeat x-ray abdomen on 2/14 with no evidence of bowel obstruction; NG tube was removed started on a clear liquid diet. -- General surgery following, appreciate assistance -- Starting clear liquid diet today, further advancement per general surgery -- Solu-Medrol 80 mg IV daily -- LR at 75 MLS per hour  Acute metabolic encephalopathy: Resolved Ventilator dependence: Resolved On presentation, patient was confused, nonverbal and significant discomfort and was intubated for airway protection and in order to obtain diagnostic procedures.  Patient was successfully extubated on 2/13.  Confusion now resolved.  Seizure disorder -- Keppra 750 mg IV twice daily  GERD -- Protonix 40 mg IV daily  HLD: -- Hold home Lipitor for now  BPH: -- Hold home tamsulosin for now  Type 2 diabetes mellitus On metformin 5 mg p.o. twice daily at home.  Hemoglobin A1c 7.2 on 12/24/2022. -- Hold oral hypoglycemics while inpatient -- Sensitive SSI for coverage --CBGs qAC/HS  DVT prophylaxis: enoxaparin (LOVENOX) injection 40 mg Start: 12/25/22 1000    Code Status: Full Code Family Communication: No family present at bedside this morning  Disposition Plan:  Level of care: Med-Surg Status is: Inpatient Remains inpatient appropriate because: Needs further advancement of diet before stable for discharge    Consultants:  [facility] gastroenterology -signed off 2/13 General surgery PCCM signed off 2/14  Procedures:  Intubation 2/11 Extubation 2/13  Antimicrobials:  Zosyn 2/11 - 2/12   Subjective: Patient seen examined bedside, lying in bed.  No specific complaints this morning with abdominal discomfort which is much improved.  Discussed with general  surgery this morning, advancing to a clear liquid diet today.  NG tube  has been removed.  No other specific complaints or concerns at this time.  Patient denies headache, no dizziness, no chest pain, no palpitations, no shortness of breath, no focal weakness, no fatigue, no paresthesias.  No acute events overnight per nursing staff.  Objective: Vitals:   12/26/22 2120 12/27/22 0500 12/27/22 0555 12/27/22 0737  BP: (!) 155/87  135/72 128/78  Pulse: 62  62 61  Resp:    16  Temp: 97.6 F (36.4 C)  97.8 F (36.6 C) 97.8 F (36.6 C)  TempSrc: Oral  Oral Oral  SpO2: 100%  100% 98%  Weight:  76 kg      Intake/Output Summary (Last 24 hours) at 12/27/2022 1432 Last data filed at 12/27/2022 0133 Gross per 24 hour  Intake 377.79 ml  Output 1030 ml  Net -652.21 ml   Filed Weights   12/25/22 0334 12/26/22 0509 12/27/22 0500  Weight: 76.2 kg 76.1 kg 76 kg    Examination:  Physical Exam: GEN: NAD, alert and oriented x 3, ill in appearance HEENT: NCAT, PERRL, EOMI, sclera clear, MMM PULM: CTAB w/o wheezes/crackles, normal respiratory effort, on room air CV: RRR w/o M/G/R GI: abd soft, mild neurolyse tenderness, mild distention, faint bowel sounds, right lower quadrant fistula noted MSK: no peripheral edema, moves all extremities independently NEURO: CN II-XII intact, no focal deficits Integumentary: Right lower quadrant fistula noted, otherwise no other concerning rashes/lesions/wounds noted on exposed skin surfaces    Data Reviewed: I have personally reviewed following labs and imaging studies  CBC: Recent Labs  Lab 12/24/22 1947 12/24/22 2010 12/24/22 2116 12/24/22 2348 12/25/22 0432 12/26/22 0819 12/27/22 0052  WBC 13.8*  --   --  11.5* 8.5 7.0 6.6  HGB 14.1   < > 10.9* 13.1 12.6* 11.1* 11.2*  HCT 44.6   < > 32.0* 41.8 39.7 35.1* 35.4*  MCV 83.4  --   --  85.0 83.4 83.2 83.5  PLT 208  --   --  187 171 166 146*   < > = values in this interval not displayed.   Basic Metabolic Panel: Recent Labs  Lab 12/24/22 1947 12/24/22 2010  12/24/22 2116 12/24/22 2348 12/25/22 0432 12/26/22 0819 12/27/22 0052  NA 139 140 138  --  140 138 140  K 4.0 4.0 3.7  --  4.3 4.0 3.9  CL 102 106  --   --  104 107 110  CO2 25  --   --   --  25 20* 23  GLUCOSE 192* 188*  --   --  152* 87 111*  BUN 11 11  --   --  10 13 16  $ CREATININE 1.03 0.90  --  1.09 1.04 1.21 1.14  CALCIUM 9.4  --   --   --  8.6* 8.0* 8.1*  MG  --   --   --  1.6*  --  2.0  --    GFR: Estimated Creatinine Clearance: 56.7 mL/min (by C-G formula based on SCr of 1.14 mg/dL). Liver Function Tests: Recent Labs  Lab 12/24/22 1947  AST 19  ALT 11  ALKPHOS 57  BILITOT 0.8  PROT 6.8  ALBUMIN 3.7   Recent Labs  Lab 12/24/22 1947  LIPASE 31   Recent Labs  Lab 12/24/22 2348  AMMONIA 24   Coagulation Profile: No results for input(s): "INR", "PROTIME" in the last 168 hours. Cardiac Enzymes: No results for  input(s): "CKTOTAL", "CKMB", "CKMBINDEX", "TROPONINI" in the last 168 hours. BNP (last 3 results) No results for input(s): "PROBNP" in the last 8760 hours. HbA1C: Recent Labs    12/24/22 2348  HGBA1C 7.2*   CBG: Recent Labs  Lab 12/26/22 2121 12/27/22 0146 12/27/22 0553 12/27/22 0831 12/27/22 1136  GLUCAP 119* 107* 93 97 102*   Lipid Profile: Recent Labs    12/25/22 0432  TRIG 299*   Thyroid Function Tests: No results for input(s): "TSH", "T4TOTAL", "FREET4", "T3FREE", "THYROIDAB" in the last 72 hours. Anemia Panel: No results for input(s): "VITAMINB12", "FOLATE", "FERRITIN", "TIBC", "IRON", "RETICCTPCT" in the last 72 hours. Sepsis Labs: Recent Labs  Lab 12/24/22 2348 12/25/22 0432  PROCALCITON <0.10  --   LATICACIDVEN 2.5* 2.9*    Recent Results (from the past 240 hour(s))  Culture, blood (Routine X 2) w Reflex to ID Panel     Status: None (Preliminary result)   Collection Time: 12/24/22 11:29 PM   Specimen: BLOOD  Result Value Ref Range Status   Specimen Description BLOOD BLOOD RIGHT HAND  Final   Special Requests    Final    BOTTLES DRAWN AEROBIC AND ANAEROBIC Blood Culture results may not be optimal due to an inadequate volume of blood received in culture bottles   Culture   Final    NO GROWTH 2 DAYS Performed at [facility] [address] [place], [place] [address]    Report Status PENDING  Incomplete  Culture, blood (Routine X 2) w Reflex to ID Panel     Status: None (Preliminary result)   Collection Time: 12/24/22 11:34 PM   Specimen: BLOOD  Result Value Ref Range Status   Specimen Description BLOOD LEFT ANTECUBITAL  Final   Special Requests   Final    BOTTLES DRAWN AEROBIC AND ANAEROBIC Blood Culture adequate volume   Culture   Final    NO GROWTH 2 DAYS Performed at [facility], [place] [address] [place], [place] [address]    Report Status PENDING  Incomplete  Resp panel by RT-PCR (RSV, Flu A&B, Covid) Anterior Nasal Swab     Status: None   Collection Time: 12/25/22 12:20 AM   Specimen: Anterior Nasal Swab  Result Value Ref Range Status   SARS Coronavirus 2 by RT PCR NEGATIVE NEGATIVE Final   Influenza A by PCR NEGATIVE NEGATIVE Final   Influenza B by PCR NEGATIVE NEGATIVE Final    Comment: (NOTE) The Xpert Xpress SARS-CoV-2/FLU/RSV plus assay is intended as an aid in the diagnosis of influenza from Nasopharyngeal swab specimens and should not be used as a sole basis for treatment. Nasal washings and aspirates are unacceptable for Xpert Xpress SARS-CoV-2/FLU/RSV testing.  Fact Sheet for Patients: EntrepreneurPulse.com.au  Fact Sheet for Healthcare Providers: IncredibleEmployment.be  This test is not yet approved or cleared by the [place] FDA and has been authorized for detection and/or diagnosis of SARS-CoV-2 by FDA under an Emergency Use Authorization (EUA). This EUA will remain in effect (meaning this test can be used) for the duration of the COVID-19 declaration under Section 564(b)(1) of the Act, 21 U.S.C. section  360bbb-3(b)(1), unless the authorization is terminated or revoked.     Resp Syncytial Virus by PCR NEGATIVE NEGATIVE Final    Comment: (NOTE) Fact Sheet for Patients: EntrepreneurPulse.com.au  Fact Sheet for Healthcare Providers: IncredibleEmployment.be  This test is not yet approved or cleared by the [place] FDA and has been authorized for detection and/or diagnosis of SARS-CoV-2 by  FDA under an Emergency Use Authorization (EUA). This EUA will remain in effect (meaning this test can be used) for the duration of the COVID-19 declaration under Section 564(b)(1) of the Act, 21 U.S.C. section 360bbb-3(b)(1), unless the authorization is terminated or revoked.  Performed at [facility], [place] [address], [place], [place] [address]   MRSA Next Gen by PCR, Nasal     Status: None   Collection Time: 12/25/22 12:46 AM   Specimen: Nasal Mucosa; Nasal Swab  Result Value Ref Range Status   MRSA by PCR Next Gen NOT DETECTED NOT DETECTED Final    Comment: (NOTE) The GeneXpert MRSA Assay (FDA approved for NASAL specimens only), is one component of a comprehensive MRSA colonization surveillance program. It is not intended to diagnose MRSA infection nor to guide or monitor treatment for MRSA infections. Test performance is not FDA approved in patients less than 22 years old. Performed at [facility], [place] [address], [place], [place] [address]          Radiology Studies: DG Abd Portable 1V-Small Bowel Obstruction Protocol-initial, 8 hr delay  Result Date: 12/27/2022 CLINICAL DATA:  Small-bowel obstruction. EXAM: PORTABLE ABDOMEN - 1 VIEW COMPARISON:  12/26/2022. FINDINGS: The bowel gas pattern is normal. Contrast is present in the colon. Scattered diverticula are present along the distal transverse colon. Stones are present in the left kidney. Total hip arthroplasty changes are present on the left. IMPRESSION: 1. No evidence of bowel  obstruction. 2. Colonic diverticulosis. 3. Left renal calculi. Electronically Signed   By: Brett Fairy M.D.   On: 12/27/2022 02:07   DG Abd Portable 1V  Result Date: 12/26/2022 CLINICAL DATA:  Abdominal pain, small-bowel obstruction EXAM: PORTABLE ABDOMEN - 1 VIEW COMPARISON:  12/25/2022 abdominal radiograph FINDINGS: Enteric tube terminates in the proximal stomach. Relatively gasless abdomen with similar gas-filled bowel loop overlying the sacrum, favor colorectal gas. No evidence of pneumatosis or pneumoperitoneum. Upper left renal 12 mm and lower left renal 6 mm stones. Left total hip arthroplasty is partially visualized. IMPRESSION: Enteric tube terminates in the proximal stomach. Similar relatively gasless abdomen with gas-filled bowel loop overlying the sacrum, favor colorectal gas. No evidence of pneumatosis or pneumoperitoneum. Left nephrolithiasis. Electronically Signed   By: Ilona Sorrel M.D.   On: 12/26/2022 10:38        Scheduled Meds:  Chlorhexidine Gluconate Cloth  6 each Topical Q0600   enoxaparin (LOVENOX) injection  40 mg Subcutaneous Daily   insulin aspart  0-9 Units Subcutaneous Q4H   methylPREDNISolone (SOLU-MEDROL) injection  80 mg Intravenous Daily   pantoprazole (PROTONIX) IV  40 mg Intravenous Daily   Continuous Infusions:  lactated ringers 75 mL/hr at 12/27/22 0940   levETIRAcetam 750 mg (12/27/22 0942)     LOS: 3 days    Time spent: 50 minutes spent on chart review, discussion with nursing staff, consultants, updating family and interview/physical exam; more than 50% of that time was spent in counseling and/or coordination of care.    Ki Corbo J [place] DO [facility] Available via Epic secure chat 7am-7pm After these hours, please refer to coverage provider listed on [email or web address] 12/27/2022, 2:32 PM

## 2022-12-27 NOTE — Progress Notes (Signed)
   12/27/22 1600  Mobility  Activity Ambulated with assistance in hallway  Level of Assistance Standby assist, set-up cues, supervision of patient - no hands on  Assistive Device Front wheel walker  Distance Ambulated (ft) 250 ft  Activity Response Tolerated well  Mobility Referral Yes  $Mobility charge 1 Mobility   Mobility Specialist Progress Note  Pt in bed and agreeable. Had no c/o pain. Returned to bed w/ all needs met and call bell in reach with alarm on.   [name] Mobility Specialist  Please contact via SecureChat or Rehab office at [phone]

## 2022-12-28 DIAGNOSIS — K56609 Unspecified intestinal obstruction, unspecified as to partial versus complete obstruction: Secondary | ICD-10-CM | POA: Diagnosis not present

## 2022-12-28 LAB — GLUCOSE, CAPILLARY
Glucose-Capillary: 114 mg/dL — ABNORMAL HIGH (ref 70–99)
Glucose-Capillary: 277 mg/dL — ABNORMAL HIGH (ref 70–99)

## 2022-12-28 MED ORDER — TRAMADOL HCL 50 MG PO TABS: 50.0000 mg | ORAL_TABLET | Freq: Four times a day (QID) | ORAL | Status: DC | PRN

## 2022-12-28 MED ORDER — PREDNISONE 10 MG PO TABS: 40.0000 mg | ORAL_TABLET | Freq: Every day | ORAL | 0 refills | Status: AC

## 2022-12-28 MED ORDER — ACETAMINOPHEN 325 MG PO TABS: 650.0000 mg | ORAL_TABLET | Freq: Four times a day (QID) | ORAL | Status: DC | PRN

## 2022-12-28 MED ORDER — PANTOPRAZOLE SODIUM 40 MG PO TBEC: 40.0000 mg | DELAYED_RELEASE_TABLET | Freq: Every day | ORAL | 2 refills | Status: DC

## 2022-12-28 MED ORDER — METHYLPREDNISOLONE SODIUM SUCC 40 MG IJ SOLR
40.0000 mg | Freq: Every day | INTRAMUSCULAR | Status: DC
  Administered 2022-12-28: 40 mg via INTRAVENOUS
  Filled 2022-12-28: qty 1

## 2022-12-28 NOTE — Progress Notes (Signed)
[facility] Progress Note     Subjective: CC:  Had another BM yesterday and passing flatus. No abd pain. No n/v. Tolerating clear liquids  Objective: Vital signs in last 24 hours: Temp:  [97.9 F (36.6 C)-98.4 F (36.9 C)] 98.4 F (36.9 C) (02/15 0457) Pulse Rate:  [55-68] 61 (02/15 0457) Resp:  [16-17] 17 (02/15 0457) BP: (148-166)/(78-84) 148/82 (02/15 0457) SpO2:  [99 %-100 %] 99 % (02/15 0457) Weight:  [73.4 kg] 73.4 kg (02/15 0457) Last BM Date : 12/27/22  Intake/Output from previous day: 02/14 0701 - 02/15 0700 In: 480 [P.O.:480] Out: 1375 [Urine:1375] Intake/Output this shift: No intake/output data recorded.  PE: Gen:  Alert, laying comfortably in bed in NAD Card:  Regular rate and rhythm Pulm:  respiratory effort nonlabored on room air Abd: Soft, nondistended, NT. RLQ fistula with scant drainage on bandage  Skin: warm and dry, no rashes  Psych: unable to assess   Lab Results:  Recent Labs    12/26/22 0819 12/27/22 0052  WBC 7.0 6.6  HGB 11.1* 11.2*  HCT 35.1* 35.4*  PLT 166 146*    BMET Recent Labs    12/26/22 0819 12/27/22 0052  NA 138 140  K 4.0 3.9  CL 107 110  CO2 20* 23  GLUCOSE 87 111*  BUN 13 16  CREATININE 1.21 1.14  CALCIUM 8.0* 8.1*    PT/INR No results for input(s): "LABPROT", "INR" in the last 72 hours. CMP     Component Value Date/Time   NA 140 12/27/2022 0052   K 3.9 12/27/2022 0052   CL 110 12/27/2022 0052   CO2 23 12/27/2022 0052   GLUCOSE 111 (H) 12/27/2022 0052   BUN 16 12/27/2022 0052   CREATININE 1.14 12/27/2022 0052   CALCIUM 8.1 (L) 12/27/2022 0052   PROT 6.8 12/24/2022 1947   ALBUMIN 3.7 12/24/2022 1947   AST 19 12/24/2022 1947   ALT 11 12/24/2022 1947   ALKPHOS 57 12/24/2022 1947   BILITOT 0.8 12/24/2022 1947   GFRNONAA >60 12/27/2022 0052   GFRAA >60 01/12/2017 1239   Lipase     Component Value Date/Time   LIPASE 31 12/24/2022 1947       Studies/Results: DG Abd Portable 1V-Small  Bowel Obstruction Protocol-initial, 8 hr delay  Result Date: 12/27/2022 CLINICAL DATA:  Small-bowel obstruction. EXAM: PORTABLE ABDOMEN - 1 VIEW COMPARISON:  12/26/2022. FINDINGS: The bowel gas pattern is normal. Contrast is present in the colon. Scattered diverticula are present along the distal transverse colon. Stones are present in the left kidney. Total hip arthroplasty changes are present on the left. IMPRESSION: 1. No evidence of bowel obstruction. 2. Colonic diverticulosis. 3. Left renal calculi. Electronically Signed   By: Brett Fairy M.D.   On: 12/27/2022 02:07   DG Abd Portable 1V  Result Date: 12/26/2022 CLINICAL DATA:  Abdominal pain, small-bowel obstruction EXAM: PORTABLE ABDOMEN - 1 VIEW COMPARISON:  12/25/2022 abdominal radiograph FINDINGS: Enteric tube terminates in the proximal stomach. Relatively gasless abdomen with similar gas-filled bowel loop overlying the sacrum, favor colorectal gas. No evidence of pneumatosis or pneumoperitoneum. Upper left renal 12 mm and lower left renal 6 mm stones. Left total hip arthroplasty is partially visualized. IMPRESSION: Enteric tube terminates in the proximal stomach. Similar relatively gasless abdomen with gas-filled bowel loop overlying the sacrum, favor colorectal gas. No evidence of pneumatosis or pneumoperitoneum. Left nephrolithiasis. Electronically Signed   By: Ilona Sorrel M.D.   On: 12/26/2022 10:38    Anti-infectives: Anti-infectives (From  admission, onward)    Start     Dose/Rate Route Frequency Ordered Stop   12/25/22 0000  piperacillin-tazobactam (ZOSYN) IVPB 3.375 g  Status:  Discontinued        3.375 g 12.5 mL/hr over 240 Minutes Intravenous Every 8 hours 12/24/22 2358 12/26/22 1315        Assessment/Plan  SBO due to distal ileitis in the setting of Crohn's disease - AFVSS - complex surgical history, now with recurrent pSBO. CT shows thickening of a segment of ileum proximal to his ileocolonic anastomosis, similar in  appearance to prior imaging in January and October. - GI consulted and signed off - gave steroid recommendations  - no emergent surgical needs. Surgery would be complex and high risk given his prior history and fortunately SBO is resolving - contrast in colon on xray, having bowel movements, tolerating clears. Advance to soft - monitor chronic abdominal wound/fistula, if output increases will have [employer] RN place eakins pouch, both to protect the skin and monitor output.   We will sign off but please do not hesitate to contact us with any questions or concerns or reconsult if needed.   FEN - soft, IVF VTE - SCD's, Lovenox ID - Zosyn 2/11 -2/12    LOS: 4 days   I reviewed nursing notes, Consultant GI notes, hospitalist notes, last 24 h vitals and pain scores, last 48 h intake and output, last 24 h labs and trends, and last 24 h imaging results.  This care required moderate level of medical decision making.   Winferd Humphrey, [facility] Surgery 12/28/2022, 7:44 AM Please see Amion for pager number during day hours 7:00am-4:30pm

## 2022-12-28 NOTE — Consult Note (Signed)
[place] Nurse ostomy consult note Stoma type/location: RLQ EC fistula site.  Patient discharging and due to drainage, would like an Eakin pouch placed  Treatment options for stomal/peristomal skin: Barrier ring around opening  Cut eakin to fit and apply  Brand Males, RN has agreed to apply pouch.   Output thick creamy effluent Ostomy pouching: 1pc.Eakin Education provided: None Will not follow at this time.  Please re-consult if needed.  Estrellita Ludwig MSN, RN, FNP-BC CWON Wound, Ostomy, Continence Nurse [facility] [phone] Pager [phone]

## 2022-12-28 NOTE — Progress Notes (Signed)
   12/28/22 1504  Mobility  Activity Ambulated with assistance in hallway  Level of Assistance Standby assist, set-up cues, supervision of patient - no hands on  Assistive Device Front wheel walker  Distance Ambulated (ft) 250 ft  Activity Response Tolerated well  Mobility Referral Yes  $Mobility charge 1 Mobility   Mobility Specialist Progress Note  Pt was in bed and agreeable. Had c/o leg weakness. Returned to chair w/ all needs met and RN in room.  Lucious Groves Mobility Specialist  Please contact via SecureChat or Rehab office at [phone]

## 2022-12-28 NOTE — Discharge Instructions (Signed)
Please ensure close follow-up with your primary gastroenterologist, Dr. Alessandra Bevels within the next 1 week

## 2022-12-28 NOTE — Progress Notes (Signed)
Eakins pouch applied to patients fistula. Patient provided additional supplies.

## 2022-12-28 NOTE — Discharge Summary (Signed)
Physician Discharge Summary  MARQUI Holt [id number] DOB: Dec 19, 1955 DOA: 12/24/2022  PCP: [name], MD  Admit date: 12/24/2022 Discharge date: 12/28/2022  Admitted From: Home Disposition: Home  Recommendations for Outpatient Follow-up:  Follow up with PCP in 1-2 weeks Follow-up with GI, Dr. Alessandra Bevels 1 week Discharged on prednisone 40 mg p.o. daily, 2-week supply given; further directions per GI on follow-up  Home Health: No Equipment/Devices: eakins pouch for draining fistula  Discharge Condition: Stable CODE STATUS: Full code Diet recommendation: Heart healthy/consistent carbohydrate diet  History of present illness:  [name] is a 67 y.o. male with past medical history significant for Crohn's disease with ileostomy, nephrolithiasis, type 2 diabetes mellitus, seizure disorder on Keppra who presented to [facility] ED on 2/11 with severe abdominal pain and confusion.  On arrival to the ED, patient was moaning, crying in pain and not answering questions.  Patient inability to lay still.  BP 160/97 and afebrile.  Respiratory rate in the 20s.  Lipase 31.  Urinalysis unremarkable.  Patient was intubated for diagnostics.  CT head with no acute abnormality.  CTA chest/abdomen/pelvis with trace pleural effusions, bilateral posterior basilar opacity, possible atelectasis versus pneumonia, bilateral thyroid nodules, bilateral nephrolithiasis with chronic with chronic pyelectasis left greater than right but no ureteral stone/ureteral dilation, increased dilation of multiple small bowel segments consistent with small bowel obstruction related to distal ileitis.  Patient was started on antibiotics.  GI and general surgery consulted.  Patient was initially admitted to the [facility] service.   Significant Hospital events: 2/11: Admit, admitted to the PCCM service 2/12: Seen by GI; recommended IV steroids and transition to prednisone 40 mg daily on discharge with outpatient follow-up with Dr.  Alessandra Bevels 2/13: Extubated 2/14: Transferred to [place], general surgery starting clear liquid diet 2/15: Tolerating advance diet, general surgery signed off, GI recommended prednisone 40 mg daily on discharge until follows up with Dr. Gabriel Cirri course:  Small bowel obstruction 2/2 distal ileitis in the setting of Crohn's disease Patient presenting to ED with progressive abdominal pain.  CT abdomen/pelvis with increased dilation of multiple small bowel segments consistent with small bowel obstruction related to distal ileitis.  Patient was initially admitted to the intensive care unit, started on IV Zosyn and NG tube placed to intermittent suction.  Patient was seen by GI who recommended IV steroids with transition to prednisone on discharge and outpatient follow-up with Dr. Alessandra Bevels. Patient was started on small bowel protocol with repeat x-ray abdomen on 2/14 with no evidence of bowel obstruction; NG tube was removed started on a clear liquid diet.  Diet was slowly advanced with toleration.  Patient will transition to prednisone 40 mg p.o. daily until follows up with gastroenterology, Dr. Alessandra Bevels outpatient; 2-week supply given, needs to be seen in 1 week.   Acute metabolic encephalopathy: Resolved Ventilator dependence: Resolved On presentation, patient was confused, nonverbal and significant discomfort and was intubated for airway protection and in order to obtain diagnostic procedures.  Patient was successfully extubated on 2/13.  Confusion now resolved.   Seizure disorder Keppra 750 mg p.o. twice daily   GERD Protonix 40 mg p.o. daily   HLD: Resume statin on discharge   BPH: Continue tamsulosin   Type 2 diabetes mellitus On metformin 5 mg p.o. twice daily at home.  Hemoglobin A1c 7.2 on 12/24/2022.   Discharge Diagnoses:  Principal Problem:   Abdominal pain Active Problems:   Small bowel obstruction (HCC)   Altered mental status    Discharge  Instructions  Discharge Instructions     Call MD for:  difficulty breathing, headache or visual disturbances   Complete by: As directed    Call MD for:  extreme fatigue   Complete by: As directed    Call MD for:  persistant dizziness or light-headedness   Complete by: As directed    Call MD for:  persistant nausea and vomiting   Complete by: As directed    Call MD for:  severe uncontrolled pain   Complete by: As directed    Call MD for:  temperature >100.4   Complete by: As directed    Diet - low sodium heart healthy   Complete by: As directed    Discharge wound care:   Complete by: As directed    Place pouch over fistula, empty and replace as needed   Increase activity slowly   Complete by: As directed       Allergies as of 12/28/2022       Reactions   Nsaids Other (See Comments)   Crohn's disease, told to avoid NSAIDs        Medication List     STOP taking these medications    aspirin 81 MG chewable tablet   potassium chloride SA 20 MEQ tablet Commonly known as: KLOR-CON M       TAKE these medications    acetaminophen 325 MG tablet Commonly known as: TYLENOL Take 2 tablets (650 mg total) by mouth every 6 (six) hours as needed for mild pain, moderate pain or headache.   atorvastatin 40 MG tablet Commonly known as: LIPITOR Take 1 tablet (40 mg total) by mouth daily.   Humira (2 Pen) 40 MG/0.4ML Pnkt Generic drug: Adalimumab Inject 40 mg into the skin every 14 (fourteen) days.   levETIRAcetam 750 MG tablet Commonly known as: KEPPRA Take 1 tablet (750 mg total) by mouth 2 (two) times daily.   mesalamine 1.2 g EC tablet Commonly known as: LIALDA Take 2.4 g by mouth 2 (two) times daily.   metFORMIN 500 MG tablet Commonly known as: GLUCOPHAGE Take 500 mg by mouth 2 (two) times daily.   pantoprazole 40 MG tablet Commonly known as: Protonix Take 1 tablet (40 mg total) by mouth daily.   potassium citrate 10 MEQ (1080 MG) SR tablet Commonly known  as: UROCIT-K Take 40 mEq by mouth 4 (four) times daily.   predniSONE 10 MG tablet Commonly known as: DELTASONE Take 4 tablets (40 mg total) by mouth daily for 14 days. What changed: how much to take   tamsulosin 0.4 MG Caps capsule Commonly known as: FLOMAX Take 1 capsule (0.4 mg total) by mouth daily after supper. What changed: when to take this   Trospium Chloride 60 MG Cp24 Take 60 mg by mouth in the morning.   Valtoco 10 MG Dose 10 MG/0.1ML Liqd Generic drug: diazePAM Place 10 mg into the nose as needed (seizure lasting greater than 5 minutes).   VITAMIN B-12 PO Take 1 tablet by mouth in the morning.   VITAMIN D-3 PO Take 1 capsule by mouth in the morning.               Discharge Care Instructions  (From admission, onward)           Start     Ordered   12/28/22 0000  Discharge wound care:       Comments: Place pouch over fistula, empty and replace as needed   12/28/22 1350  Follow-up Information     Brahmbhatt, Parag, MD. Schedule an appointment as soon as possible for a visit in 1 week(s).   Specialty: Gastroenterology Contact information: [address] [place] [address] [phone]         [name], MD. Schedule an appointment as soon as possible for a visit in 1 week(s).   Specialty: Internal Medicine Contact information: [place] [address] [phone]                Allergies  Allergen Reactions   Nsaids Other (See Comments)    Crohn's disease, told to avoid NSAIDs    Consultations: [facility] gastroenterology, Dr. Paulita Fujita General Surgery   Procedures/Studies: DG Abd Portable 1V-Small Bowel Obstruction Protocol-initial, 8 hr delay  Result Date: 12/27/2022 CLINICAL DATA:  Small-bowel obstruction. EXAM: PORTABLE ABDOMEN - 1 VIEW COMPARISON:  12/26/2022. FINDINGS: The bowel gas pattern is normal. Contrast is present in the colon. Scattered diverticula are present along the  distal transverse colon. Stones are present in the left kidney. Total hip arthroplasty changes are present on the left. IMPRESSION: 1. No evidence of bowel obstruction. 2. Colonic diverticulosis. 3. Left renal calculi. Electronically Signed   By: Brett Fairy M.D.   On: 12/27/2022 02:07   DG Abd Portable 1V  Result Date: 12/26/2022 CLINICAL DATA:  Abdominal pain, small-bowel obstruction EXAM: PORTABLE ABDOMEN - 1 VIEW COMPARISON:  12/25/2022 abdominal radiograph FINDINGS: Enteric tube terminates in the proximal stomach. Relatively gasless abdomen with similar gas-filled bowel loop overlying the sacrum, favor colorectal gas. No evidence of pneumatosis or pneumoperitoneum. Upper left renal 12 mm and lower left renal 6 mm stones. Left total hip arthroplasty is partially visualized. IMPRESSION: Enteric tube terminates in the proximal stomach. Similar relatively gasless abdomen with gas-filled bowel loop overlying the sacrum, favor colorectal gas. No evidence of pneumatosis or pneumoperitoneum. Left nephrolithiasis. Electronically Signed   By: Ilona Sorrel M.D.   On: 12/26/2022 10:38   DG Abd 1 View  Result Date: 12/25/2022 CLINICAL DATA:  Nasogastric tube placement. EXAM: ABDOMEN - 1 VIEW COMPARISON:  December 24, 2022. FINDINGS: Distal tip of nasogastric tube is seen in expected position of distal stomach. IMPRESSION: Distal tip of nasogastric tube is seen in expected position of distal stomach. Electronically Signed   By: Marijo Conception M.D.   On: 12/25/2022 10:32   CT Angio Chest/Abd/Pel for Dissection W and/or Wo Contrast  Result Date: 12/24/2022 CLINICAL DATA:  Acute aortic syndrome suspected. Bowel obstruction. Abdominal pain. History of Crohn's disease. EXAM: CT ANGIOGRAPHY CHEST, ABDOMEN AND PELVIS TECHNIQUE: Initial noncontrast chest CT was performed axial only, from the lung apices to the adrenal glands. Multidetector CT imaging through the chest, abdomen and pelvis was performed using the  standard protocol during bolus administration of intravenous contrast. Multiplanar reconstructed images and MIPs were obtained and reviewed to evaluate the vascular anatomy. RADIATION DOSE REDUCTION: This exam was performed according to the departmental dose-optimization program which includes automated exposure control, adjustment of the mA and/or kV according to patient size and/or use of iterative reconstruction technique. CONTRAST:  100 mL Omnipaque 350 IV. COMPARISON:  No prior chest CT for comparison. Most recent chest x-ray was portable chest today, portable chest 11/29/2022. Comparison is made with abdomen and pelvis CTs with IV contrast from 11/29/2022 and 09/10/2022. FINDINGS: CTA CHEST FINDINGS Cardiovascular: There is mild cardiomegaly with a left chamber predominance. Minimal anterior pericardial effusion. There is scattered calcific plaque in the LAD coronary artery only.  Left ventricular wall hypertrophy is seen except for a small portion of the apex which demonstrates marked wall thinning consistent with a small chronic apical infarct. The pulmonary veins are decompressed. Pulmonary arteries are normal caliber and centrally clear. The aorta is mildly tortuous. There is minimal scattered soft plaque in the descending aorta with no other visible thoracic aortic plaques. There is no thoracic aortic aneurysm, dissection or stenosis. The great vessels are clear and branch normally. No wall calcification is seen. Mediastinum/Nodes: The patient is intubated. Tip of the ETT is 2.5 cm from the carina. NG tube loops around the stomach towards the left, the tip in the proximal upper stomach. The thoracic esophagus is unremarkable. The trachea is otherwise clear. No intrathoracic, axillary or supraclavicular adenopathy is seen. 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 On the right there is a heterogeneous nodule measuring 1.4 cm. Nonemergent  thyroid ultrasound is recommended. The left sided mass displaces the trachea left of midline but does not narrow it. Lungs/Pleura: There are trace pleural effusions. No pleural thickening or pneumothorax. The lungs are hypoexpanded. There is posterior basal opacity on both sides which could be atelectasis or combination of atelectasis and pneumonia. There are minimal paraseptal emphysematous changes in the extreme lung apices. Remaining lungs are clear. Musculoskeletal: Mild thoracic dextroscoliosis and degenerative change thoracic spine. 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 There is secondary DJD in the right shoulder. No destructive or aggressive bone lesion. The ribcage is intact. Review of the MIP images confirms the above findings. CTA ABDOMEN AND PELVIS FINDINGS VASCULAR Aorta: There are minimal scattered calcific plaques in the infrarenal aorta. There is no stenosis, dissection, or aneurysm or penetrating ulcer. Celiac: Normal. SMA: Normal. Renals: Normal. IMA: Normal. Inflow: Minimal calcification right common iliac artery. Remainder of the bilateral inflow vessels show no plaques. All inflow arteries are widely patent. The visualized outflow arteries are normal. Veins: Patent SVC. Other systemic veins and the portal veins are unopacified and not evaluated. 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 Review of the MIP images confirms the above findings. NON-VASCULAR Hepatobiliary: 18 cm in length liver with mild steatosis. No mass enhancement. Multiple cysts are again noted largest is 8 cm in the left lobe. Others are significantly smaller. Gallbladder and bile ducts are unremarkable. Pancreas: No abnormality. Spleen: No abnormality. Adrenals/Urinary Tract: There is no adrenal mass. Bilateral nephrolithiasis with scattered punctate up  to 1 mm stones on the right, scattered 1-2 mm stones on the left, 1.4 cm stone in the left upper pole and irregular 1 cm stone in the left lower pole. There are multiple bilateral too small to characterize hypodensities in both kidneys. No follow-up imaging is recommended [facility] 2018 Feb; 264-273, Management of the Incidental Renal Mass on CT, [employer] 2021; 814-848, Bosniak Classification of Cystic Renal Masses, Version 2019). No new cortical abnormality is seen. There is bilateral renal cortical thinning on the left-greater-than-right. Scattered small cortical scarring. Chronic pyelectasis on the left-greater-than-right is seen, without evidence of ureteral stones or ureteral dilatation. The bladder is poorly visualized due to spray artifact from a left hip arthroplasty, but unremarkable where seen. Stomach/Bowel: Mild fluid distention of the stomach. NGT in place as described above. Surgical changes of right hemicolectomy, distal small bowel resection and primary ileocolic surgical anastomosis are  redemonstrated. There is a small defect in the right anterolateral mid abdominal wall at the level of the surgical anastomosis. A fluid-filled small caliber tubular structure extends through the defect to the skin surface, with surrounding skin thickening, unknown if this is a purposeful ileostomy or a fistula from nearby small bowel but there did not seem to be communication with the skin surface on the prior studies. There is additional defect in the right lateral abdominal wall below this level with fat hernia, multilevel postsurgical changes to the more anterior abdominal wall and asymmetric right rectus atrophy and abdominal wall thinning. Again noted is wall thickening in the distal ileum extending up to the surgical anastomosis, but the inflammatory changes involving this loop (anterior right lower abdomen, series 14 axial [id number]) are not as pronounced as previously. There is increased dilatation of multiple  upstream small bowel segments above this level as well as below this level up to 4.2 cm and multiple small bowel loops adherent to the anterior wall. A surgically widened segment of small bowel is also more dilated than previously, currently 7 cm anterior mid abdomen, previously 5.4 cm. The transition to decompressed caliber appears to be at the inferior aspect of the surgically widened segment on coronal reconstruction series 10 images 21-24, with the only normal caliber segment in this area being the inflamed distal ileal anastomotic segment. The gastric wall, duodenum and jejunum appear normal. The large bowel wall is normal thickness with scattered diverticulosis. Lymphatic: No adenopathy is seen. Reproductive: The prostate poorly visualized due to artifact from the left hip replacement but does measure enlarged at 4.9 cm. Other: No free fluid, free hemorrhage or free air. No incarcerated hernia. 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 Mild chronic compression fractures L1 and L2. No destructive bone lesion is seen. Review of the MIP images confirms the above findings. IMPRESSION: 1. No findings of aortic aneurysm or dissection. Minimal abdominal aortic and iliac atherosclerosis. 2. Cardiomegaly with left chamber predominance, small anterior pericardial effusion. 3. 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 4. Trace pleural effusions with bilateral posterior basal opacity which could be atelectasis, pneumonia or combination. 5. Bilateral thyroid nodules, largest on the left measuring 4.0 x 2.7 x 4.6 cm. Nonemergent thyroid ultrasound recommended. 6. Bilateral nephrolithiasis with chronic pyelectasis left-greater-than-right, but no ureteral stones or ureteral dilatation. 7. Surgical changes of right hemicolectomy, distal small bowel resection and primary ileocolic surgical  anastomosis. 8. Small defect in the right anterolateral mid abdominal wall at the level of the surgical anastomosis. There is a fluid-filled tubular structure extending through the defect to the skin surface, unknown if this is a purposeful ileostomy or a fistula from nearby small bowel. There did not seem to be communication with the skin surface on the prior studies. 9. Increased dilatation of multiple small bowel segments, with transition to decompressed caliber at the inferior aspect of the anterior mid abdominal surgically widened segment just above the thickened distal ileal anastomotic segment. Findings consistent with small-bowel obstruction probably related to distal ileitis. Inflammatory changes along the anastomotic segment are less prominent than previously. 10. Minimal apical emphysema. 11. Stable hepatic cysts. 12. Bilateral renal cortical thinning and stones. No obstructing stones. 13. Prostatomegaly. 14. Diverticulosis. Emphysema (ICD10-J43.9). Electronically Signed   By: Telford Nab M.D.   On: 12/24/2022 22:20   CT Head Wo Contrast  Result Date: 12/24/2022 CLINICAL DATA:  Mental status change, unknown cause  EXAM: CT HEAD WITHOUT CONTRAST TECHNIQUE: Contiguous axial images were obtained from the base of the skull through the vertex without intravenous contrast. RADIATION DOSE REDUCTION: This exam was performed according to the departmental dose-optimization program which includes automated exposure control, adjustment of the mA and/or kV according to patient size and/or use of iterative reconstruction technique. COMPARISON:  CT head 12/19/2022 FINDINGS: Brain: Stable possible chronic lacunar infarction of the right posterior limb of the internal capsule versus CSF space. No evidence of large-territorial acute infarction. No parenchymal hemorrhage. No mass lesion. No extra-axial collection. No mass effect or midline shift. No hydrocephalus. Basilar cisterns are patent. Vascular: No hyperdense  vessel. Atherosclerotic calcifications are present within the cavernous internal carotid and vertebral arteries. Skull: No acute fracture or focal lesion. Sinuses/Orbits: Paranasal sinuses and mastoid air cells are clear. The orbits are unremarkable. Other: None. IMPRESSION: No acute intracranial abnormality. Electronically Signed   By: Iven Finn M.D.   On: 12/24/2022 21:30   DG Abdomen 1 View  Result Date: 12/24/2022 CLINICAL DATA:  abd pain, ng tube EXAM: ABDOMEN - 1 VIEW COMPARISON:  X-ray abdomen 11/30/2022 FINDINGS: Enteric tube courses below the hemidiaphragm with tip and side port overlying the expected region of the gastric lumen. Large loop of enteric tube noted within the gastric lumen inferior to the hemidiaphragm. The bowel gas pattern is normal. No radio-opaque calculi or other significant radiographic abnormality are seen. IMPRESSION: Enteric tube in good position with large loop within the gastric lumen. Recommend retracting by 10 cm. Electronically Signed   By: Iven Finn M.D.   On: 12/24/2022 20:45   DG Chest Portable 1 View  Result Date: 12/24/2022 CLINICAL DATA:  ET tube placement. EXAM: PORTABLE CHEST 1 VIEW COMPARISON:  12/24/2022 at 8:16 p.m. FINDINGS: Endotracheal tube has been retracted, tip now projecting 3.2 cm above the carina. No other change. Nasal/orogastric tube is stable and well positioned. IMPRESSION: 1. Repositioned, well-positioned endotracheal tube. Electronically Signed   By: Lajean Manes M.D.   On: 12/24/2022 20:38   DG Chest Portable 1 View  Result Date: 12/24/2022 CLINICAL DATA:  ET tube placement. EXAM: PORTABLE CHEST 1 VIEW COMPARISON:  11/29/2022. FINDINGS: Endotracheal tube tip lies at the upper carina. Nasal/orogastric tube passes well below the diaphragm and into the stomach. Cardiac silhouette normal in size.  No mediastinal or hilar masses. Lung volumes are low. Mild opacity at the lung bases is consistent with atelectasis. Remainder of the  lungs is clear. No convincing pleural effusion and no pneumothorax. IMPRESSION: 1. Endotracheal tube tip projects at the upper carina. 2. Well-positioned nasal/orogastric tube. 3. Low lung volumes with mild basilar atelectasis. Electronically Signed   By: Lajean Manes M.D.   On: 12/24/2022 20:37   MR BRAIN WO CONTRAST  Result Date: 12/20/2022 CLINICAL DATA:  Follow-up examination for stroke. EXAM: MRI HEAD WITHOUT CONTRAST TECHNIQUE: Multiplanar, multiecho pulse sequences of the brain and surrounding structures were obtained without intravenous contrast. COMPARISON:  Prior CT from 12/19/2022 as well as recent MRI from 11/29/2022. FINDINGS: Brain: Cerebral volume within normal limits for age. Scattered patchy T2/FLAIR hyperintensity involving the periventricular and deep white matter both cerebral hemispheres as well as the pons, consistent with chronic microvascular ischemic disease. Remote lacunar infarct present at the right thalamocapsular region. No abnormal foci of restricted diffusion to suggest acute or subacute ischemia. Gray-white matter differentiation otherwise maintained. No other areas of chronic cortical infarction. No acute or chronic intracranial blood products. No mass lesion, midline shift or mass effect.  No hydrocephalus or extra-axial fluid collection. Pituitary gland and suprasellar region within normal limits. Vascular: Right vertebral artery hypoplastic and not well seen. Major intracranial vascular flow voids are otherwise well maintained. Skull and upper cervical spine: Craniocervical junction within normal limits. Bone marrow signal intensity grossly normal. No focal marrow replacing lesion. No scalp soft tissue abnormality. Sinuses/Orbits: Globes orbital soft tissues within normal limits. Paranasal sinuses are largely clear. No mastoid effusion. Other: None. IMPRESSION: 1. No acute intracranial abnormality. 2. 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 Electronically Signed   By: Jeannine Boga M.D.   On: 12/20/2022 01:31   CT Head Wo Contrast  Result Date: 12/19/2022 CLINICAL DATA:  Neurologic deficit. EXAM: CT HEAD WITHOUT CONTRAST TECHNIQUE: Contiguous axial images were obtained from the base of the skull through the vertex without intravenous contrast. RADIATION DOSE REDUCTION: This exam was performed according to the departmental dose-optimization program which includes automated exposure control, adjustment of the mA and/or kV according to patient size and/or use of iterative reconstruction technique. COMPARISON:  Head CT dated 11/29/2022. FINDINGS: Brain: The ventricles and sulci appropriate size for patient's age. The gray-white matter discrimination is preserved. Probable tiny right thalamic old lacunar infarct. There is no acute intracranial hemorrhage. No mass effect or midline shift. No extra-axial fluid collection. Vascular: No hyperdense vessel or unexpected calcification. Skull: Normal. Negative for fracture or focal lesion. Sinuses/Orbits: No acute finding. Other: None IMPRESSION: 1. No acute intracranial pathology. 2. Probable tiny old right thalamic lacunar infarct. Electronically Signed   By: Anner Crete M.D.   On: 12/19/2022 20:48   Overnight EEG with video  Result Date: 11/30/2022 Lora Havens, MD     12/01/2022  9:15 AM Patient Name: MAMADOU MINICOZZI MRN: [id number] Epilepsy Attending: Lora Havens Referring Physician/Provider: Derek Jack, MD Duration: 11/30/2022 0211 to 12/01/2022 0211 Patient history: 67 year old male with new onset status epilepticus.  EEG to evaluate for seizure. Level of alertness: Awake, asleep AEDs during EEG study: LEV Technical aspects: This EEG study was done with scalp electrodes positioned according to the 10-20 International system of electrode placement. Electrical activity was reviewed with band pass filter of 1-70Hz$ , sensitivity of 7 uV/mm, display speed of  89m/sec with a 60Hz$  notched filter applied as appropriate. EEG data were recorded continuously and digitally stored.  Video monitoring was available and reviewed as appropriate. Description: The posterior dominant rhythm consists of 8 Hz activity of moderate voltage (25-35 uV) seen predominantly in posterior head regions, symmetric and reactive to eye opening and eye closing. Sleep was characterized by vertex waves, sleep spindles (12 to 14 Hz), maximal frontocentral region. Hyperventilation and photic stimulation were not performed.   IMPRESSION: This study is within normal limits. No seizures or epileptiform discharges were seen throughout the recording. A normal interictal EEG does not exclude the diagnosis of epilepsy. PLora Havens  DG Abdomen 1 View  Result Date: 11/30/2022 CLINICAL DATA:  Check gastric catheter placement EXAM: ABDOMEN - 1 VIEW COMPARISON:  None Available. FINDINGS: Gastric catheter extends into the stomach. Proximal side port is near the gastroesophageal junction. This could be advanced deeper into the stomach. Fullness of the left renal pelvis is noted without obstructive change. IMPRESSION: Gastric catheter as described. This could be advanced deeper into the stomach. Electronically Signed   By: MInez CatalinaM.D.   On: 11/30/2022 02:06   CT ABDOMEN PELVIS W CONTRAST  Result Date: 11/29/2022 CLINICAL DATA:  Mildly dilated small  bowel on recent plain film examination. EXAM: CT ABDOMEN AND PELVIS WITH CONTRAST TECHNIQUE: Multidetector CT imaging of the abdomen and pelvis was performed using the standard protocol following bolus administration of intravenous contrast. RADIATION DOSE REDUCTION: This exam was performed according to the departmental dose-optimization program which includes automated exposure control, adjustment of the mA and/or kV according to patient size and/or use of iterative reconstruction technique. CONTRAST:  71m OMNIPAQUE IOHEXOL 350 MG/ML SOLN COMPARISON:   Plain film from earlier in the same day. CT from 09/10/2022 FINDINGS: Lower chest: No acute abnormality. Hepatobiliary: Liver again demonstrates multiple cysts. The largest of these bridges the medial and lateral segments of the left lobe of the liver. It measures up to 7.9 cm in greatest dimension. No focal mass is seen. The gallbladder is within normal limits. Pancreas: Unremarkable. No pancreatic ductal dilatation or surrounding inflammatory changes. Spleen: Normal in size without focal abnormality. Adrenals/Urinary Tract: Adrenal glands are stable in appearance. Normal enhancement of the kidneys is seen. Multiple left-sided renal calculi are identified measuring up to 14 mm in the upper pole. Punctate stones are noted on the right. Mild increased attenuation of the urine is noted related to recent gadolinium administration. No obstructive changes are seen. The bladder is partially distended. Stomach/Bowel: Scattered diverticular change of the colon is noted. Fluid is noted throughout the colon consistent with a diarrheal state. Postsurgical changes in the right abdomen are noted consistent with prior ileocolectomy and small bowel to colon anastomosis. This appears patent. The distal ileum shows significant wall thickening, luminal narrowing and surrounding inflammatory changes consistent with the patient's given clinical history of Crohn's disease. Some fiber fatty deposition is noted as well. This appearance is similar to that seen on the most recent CT. Mildly dilated loops of small bowel are noted proximal to the distal ileum consistent with a partial small bowel obstruction. More proximal small bowel appears within normal limits. The stomach is decompressed. Vascular/Lymphatic: Aortic atherosclerosis. No enlarged abdominal or pelvic lymph nodes. Reproductive: Prostate is unremarkable. Other: No abdominal wall hernia or abnormality. No abdominopelvic ascites. Musculoskeletal: Left hip replacement is noted. No  acute bony abnormality is seen. IMPRESSION: Postsurgical changes in the right abdomen consistent with prior distal ileum and proximal right colon removal with small bowel to colonic anastomosis. Just prior to this anastomosis there is a segment of inflamed small bowel identified with generalized narrowing, wall thickening and surrounding inflammatory change. These changes are stable from the prior exam and consistent with the given clinical history. The small bowel proximal to this diseased segment is dilated consistent with a partial small bowel obstruction. These changes are new from the prior exam. Bilateral renal calculi without complicating factors. Stable hepatic cysts. Electronically Signed   By: MInez CatalinaM.D.   On: 11/29/2022 23:43   MR BRAIN W WO CONTRAST  Result Date: 11/29/2022 CLINICAL DATA:  New onset seizure EXAM: MRI HEAD WITHOUT AND WITH CONTRAST TECHNIQUE: Multiplanar, multiecho pulse sequences of the brain and surrounding structures were obtained without and with intravenous contrast. CONTRAST:  768mGADAVIST GADOBUTROL 1 MMOL/ML IV SOLN COMPARISON:  No prior MRI available, correlation is made with 11/29/2022 CT head FINDINGS: Evaluation is somewhat limited by motion artifact. Brain: No restricted diffusion to suggest acute or subacute infarct. No abnormal parenchymal or meningeal enhancement. No acute hemorrhage, mass, mass effect, or midline shift. No hydrocephalus or extra-axial collection.No hemosiderin deposition to suggest remote hemorrhage.Partial empty sella. Normal craniocervical junction. Scattered T2 hyperintense signal in the periventricular white matter,  likely the sequela of mild chronic small vessel ischemic disease. 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 The hippocampi are symmetric in size and signal. No heterotopia or evidence of cortical dysgenesis. Vascular: Patent arterial flow voids. Normal arterial and venous enhancement. Skull  and upper cervical spine: Normal marrow signal. Sinuses/Orbits: Clear paranasal sinuses.No acute finding in the orbits. Other: The mastoid air cells are well aerated. IMPRESSION: No acute intracranial process. No seizure etiology is seen. Electronically Signed   By: Merilyn Baba M.D.   On: 11/29/2022 22:16   DG Abd 1 View  Result Date: 11/29/2022 CLINICAL DATA:  Abdominal pain and distention. EXAM: ABDOMEN - 1 VIEW COMPARISON:  08/28/2012, 09/10/2022. FINDINGS: There is a mildly distended loop of small bowel in the mid left abdomen measuring up to 3.5 cm. Stones are present in the left kidney measuring up to 1.1 cm. IMPRESSION: 1. Mildly distended loop of small bowel in the mid left abdomen measuring up to 3.5 cm, possible ileus versus obstruction. CT is recommended for further evaluation. 2. Left renal calculi. Electronically Signed   By: Brett Fairy M.D.   On: 11/29/2022 21:43   DG Chest Port 1 View  Result Date: 11/29/2022 CLINICAL DATA:  Sepsis, seizure EXAM: PORTABLE CHEST 1 VIEW COMPARISON:  07/31/2022 FINDINGS: Heavily reverse lordotic projection somewhat obscures the lung bases. Low lung volumes are present. Suspicion for left greater than right airspace opacity in the lower lobes. Heart size within normal limits for projection. Expected glenohumeral alignment noted bilaterally on this single view. IMPRESSION: 1. Suspicion for left greater than right airspace opacity in the lower lobes, potentially from pneumonia, atelectasis, or aspiration pneumonitis. Assessment of the lung bases is adversely affected by the reverse lordotic projection which can exaggerate basilar findings. Repeat radiography may be warranted when feasible. 2. Low lung volumes. Electronically Signed   By: Van Clines M.D.   On: 11/29/2022 17:35   CT Head Wo Contrast  Result Date: 11/29/2022 CLINICAL DATA:  Seizure EXAM: CT HEAD WITHOUT CONTRAST TECHNIQUE: Contiguous axial images were obtained from the base of the  skull through the vertex without intravenous contrast. RADIATION DOSE REDUCTION: This exam was performed according to the departmental dose-optimization program which includes automated exposure control, adjustment of the mA and/or kV according to patient size and/or use of iterative reconstruction technique. COMPARISON:  None Available. FINDINGS: Brain: Chronic appearing lacunar infarct within the right thalamus/posterior limb of the internal capsule. No evidence of acute infarct or hemorrhage. Lateral ventricles and remaining midline structures are unremarkable. No acute extra-axial fluid collections. No mass effect. Vascular: No hyperdense vessel or unexpected calcification. Skull: Normal. Negative for fracture or focal lesion. Sinuses/Orbits: No acute finding. Other: None. IMPRESSION: 1. Chronic right thalamic lacunar infarct. No acute intracranial process. Electronically Signed   By: Randa Ngo M.D.   On: 11/29/2022 17:14     Subjective: Patient seen examined bedside, resting comfortably.  Tolerating advance diet.  General surgery now signed off.  Discussed needs close outpatient follow-up with Dr. Alessandra Bevels.  Discharging on prednisone 40 mg p.o. daily per GI, Dr. Erlinda Hong recommendation.  Asking for pouch for draining fistula.  No other questions or concerns at this time.  Denies headache, no dizziness, no chest pain, no palpitations, no shortness of breath, or/chills/night sweats, no nausea/vomiting/diarrhea, no abdominal pain, no focal weakness, no fatigue, no paresthesias.  No acute events overnight per nursing staff.  Discharge Exam: Vitals:   12/28/22 0457 12/28/22 0831  BP: (!) 148/82 Marland Kitchen)  160/83  Pulse: 61 73  Resp: 17 17  Temp: 98.4 F (36.9 C) 98 F (36.7 C)  SpO2: 99% 99%   Vitals:   12/27/22 1559 12/27/22 1954 12/28/22 0457 12/28/22 0831  BP: (!) 166/84 (!) 152/78 (!) 148/82 (!) 160/83  Pulse: (!) 55 68 61 73  Resp: 16 17 17 17  $ Temp: 97.9 F (36.6 C) 98.2 F (36.8 C)  98.4 F (36.9 C) 98 F (36.7 C)  TempSrc: Oral Oral Oral Oral  SpO2: 100% 99% 99% 99%  Weight:   73.4 kg     Physical Exam: GEN: NAD, alert and oriented x 3, chronically ill in appearance HEENT: NCAT, PERRL, EOMI, sclera clear, MMM PULM: CTAB w/o wheezes/crackles, normal respiratory effort, on room air CV: RRR w/o M/G/R GI: abd soft, NTND, 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     The results of significant diagnostics from this hospitalization (including imaging, microbiology, ancillary and laboratory) are listed below for reference.     Microbiology: Recent Results (from the past 240 hour(s))  Culture, blood (Routine X 2) w Reflex to ID Panel     Status: None (Preliminary result)   Collection Time: 12/24/22 11:29 PM   Specimen: BLOOD  Result Value Ref Range Status   Specimen Description BLOOD BLOOD RIGHT HAND  Final   Special Requests   Final    BOTTLES DRAWN AEROBIC AND ANAEROBIC Blood Culture results may not be optimal due to an inadequate volume of blood received in culture bottles   Culture   Final    NO GROWTH 3 DAYS Performed at [facility], [place] [address] [place], [place] [address]    Report Status PENDING  Incomplete  Culture, blood (Routine X 2) w Reflex to ID Panel     Status: None (Preliminary result)   Collection Time: 12/24/22 11:34 PM   Specimen: BLOOD  Result Value Ref Range Status   Specimen Description BLOOD LEFT ANTECUBITAL  Final   Special Requests   Final    BOTTLES DRAWN AEROBIC AND ANAEROBIC Blood Culture adequate volume   Culture   Final    NO GROWTH 3 DAYS Performed at  [facility], [place] [address], [place], [place] [address]    Report Status PENDING  Incomplete  Resp panel by RT-PCR (RSV, Flu A&B, Covid) Anterior Nasal Swab     Status: None   Collection Time: 12/25/22 12:20 AM   Specimen: Anterior Nasal Swab  Result Value Ref Range Status   SARS Coronavirus 2 by RT PCR NEGATIVE NEGATIVE Final   Influenza A by PCR NEGATIVE NEGATIVE Final   Influenza B by PCR NEGATIVE NEGATIVE Final    Comment: (NOTE) The Xpert Xpress SARS-CoV-2/FLU/RSV plus assay is intended as an aid in the diagnosis of influenza from Nasopharyngeal swab specimens and should not be used as a sole basis for treatment. Nasal washings and aspirates are unacceptable for Xpert Xpress SARS-CoV-2/FLU/RSV testing.  Fact Sheet for Patients: EntrepreneurPulse.com.au  Fact Sheet for Healthcare Providers: IncredibleEmployment.be  This test is not yet approved or cleared by the [place] FDA and has been authorized for detection and/or diagnosis of SARS-CoV-2 by FDA under an Emergency Use Authorization (EUA). This EUA will  remain in effect (meaning this test can be used) for the duration of the COVID-19 declaration under Section 564(b)(1) of the Act, 21 U.S.C. section 360bbb-3(b)(1), unless the authorization is terminated or revoked.     Resp Syncytial Virus by PCR NEGATIVE NEGATIVE Final    Comment: (NOTE) Fact Sheet for Patients: EntrepreneurPulse.com.au  Fact Sheet for Healthcare Providers: IncredibleEmployment.be  This test is not yet approved or cleared by the [place] FDA and has been authorized for detection and/or diagnosis of SARS-CoV-2 by FDA under an Emergency Use Authorization (EUA). This EUA will remain in effect (meaning this test can be used) for the duration of the COVID-19 declaration under Section 564(b)(1) of the Act, 21 U.S.C. section 360bbb-3(b)(1), unless the authorization is  terminated or revoked.  Performed at [facility], [place] [address], [place], [place] [address]   MRSA Next Gen by PCR, Nasal     Status: None   Collection Time: 12/25/22 12:46 AM   Specimen: Nasal Mucosa; Nasal Swab  Result Value Ref Range Status   MRSA by PCR Next Gen NOT DETECTED NOT DETECTED Final    Comment: (NOTE) The GeneXpert MRSA Assay (FDA approved for NASAL specimens only), is one component of a comprehensive MRSA colonization surveillance program. It is not intended to diagnose MRSA infection nor to guide or monitor treatment for MRSA infections. Test performance is not FDA approved in patients less than 37 years old. Performed at [facility], [place] [address], [place], [place] [address]      Labs: BNP (last 3 results) No results for input(s): "BNP" in the last 8760 hours. Basic Metabolic Panel: Recent Labs  Lab 12/24/22 1947 12/24/22 2010 12/24/22 2116 12/24/22 2348 12/25/22 0432 12/26/22 0819 12/27/22 0052  NA 139 140 138  --  140 138 140  K 4.0 4.0 3.7  --  4.3 4.0 3.9  CL 102 106  --   --  104 107 110  CO2 25  --   --   --  25 20* 23  GLUCOSE 192* 188*  --   --  152* 87 111*  BUN 11 11  --   --  10 13 16  $ CREATININE 1.03 0.90  --  1.09 1.04 1.21 1.14  CALCIUM 9.4  --   --   --  8.6* 8.0* 8.1*  MG  --   --   --  1.6*  --  2.0  --    Liver Function Tests: Recent Labs  Lab 12/24/22 1947  AST 19  ALT 11  ALKPHOS 57  BILITOT 0.8  PROT 6.8  ALBUMIN 3.7   Recent Labs  Lab 12/24/22 1947  LIPASE 31   Recent Labs  Lab 12/24/22 2348  AMMONIA 24   CBC: Recent Labs  Lab 12/24/22 1947 12/24/22 2010 12/24/22 2116 12/24/22 2348 12/25/22 0432 12/26/22 0819 12/27/22 0052  WBC 13.8*  --   --  11.5* 8.5 7.0 6.6  HGB 14.1   < > 10.9* 13.1 12.6* 11.1* 11.2*  HCT 44.6   < > 32.0* 41.8 39.7 35.1* 35.4*  MCV 83.4  --   --  85.0 83.4 83.2 83.5  PLT 208  --   --  187 171 166 146*   < > = values in this interval not displayed.   Cardiac  Enzymes: No results for input(s): "CKTOTAL", "CKMB", "CKMBINDEX", "TROPONINI" in the last 168 hours. BNP: Invalid input(s): "POCBNP" CBG: Recent Labs  Lab 12/27/22 1136 12/27/22 1653 12/27/22 1954  12/28/22 0756 12/28/22 1155  GLUCAP 102* 198* 264* 114* 277*   D-Dimer No results for input(s): "DDIMER" in the last 72 hours. Hgb A1c No results for input(s): "HGBA1C" in the last 72 hours. Lipid Profile No results for input(s): "CHOL", "HDL", "LDLCALC", "TRIG", "CHOLHDL", "LDLDIRECT" in the last 72 hours. Thyroid function studies No results for input(s): "TSH", "T4TOTAL", "T3FREE", "THYROIDAB" in the last 72 hours.  Invalid input(s): "FREET3" Anemia work up No results for input(s): "VITAMINB12", "FOLATE", "FERRITIN", "TIBC", "IRON", "RETICCTPCT" in the last 72 hours. Urinalysis    Component Value Date/Time   COLORURINE YELLOW 12/24/2022 2144   APPEARANCEUR CLEAR 12/24/2022 2144   LABSPEC 1.036 (H) 12/24/2022 2144   PHURINE 8.0 12/24/2022 2144   GLUCOSEU NEGATIVE 12/24/2022 2144   HGBUR NEGATIVE 12/24/2022 2144   [place] NEGATIVE 12/24/2022 2144   KETONESUR 5 (A) 12/24/2022 2144   PROTEINUR 100 (A) 12/24/2022 2144   UROBILINOGEN 0.2 02/16/2009 1931   NITRITE NEGATIVE 12/24/2022 2144   LEUKOCYTESUR NEGATIVE 12/24/2022 2144   Sepsis Labs Recent Labs  Lab 12/24/22 2348 12/25/22 0432 12/26/22 0819 12/27/22 0052  WBC 11.5* 8.5 7.0 6.6   Microbiology Recent Results (from the past 240 hour(s))  Culture, blood (Routine X 2) w Reflex to ID Panel     Status: None (Preliminary result)   Collection Time: 12/24/22 11:29 PM   Specimen: BLOOD  Result Value Ref Range Status   Specimen Description BLOOD BLOOD RIGHT HAND  Final   Special Requests   Final    BOTTLES DRAWN AEROBIC AND ANAEROBIC Blood Culture results may not be optimal due to an inadequate volume of blood received in culture bottles   Culture   Final    NO GROWTH 3 DAYS Performed at [facility],  [place] [address], [place], [place] [address]    Report Status PENDING  Incomplete  Culture, blood (Routine X 2) w Reflex to ID Panel     Status: None (Preliminary result)   Collection Time: 12/24/22 11:34 PM   Specimen: BLOOD  Result Value Ref Range Status   Specimen Description BLOOD LEFT ANTECUBITAL  Final   Special Requests   Final    BOTTLES DRAWN AEROBIC AND ANAEROBIC Blood Culture adequate volume   Culture   Final    NO GROWTH 3 DAYS Performed at [facility], [place] [address], [place], [place] [address]    Report Status PENDING  Incomplete  Resp panel by RT-PCR (RSV, Flu A&B, Covid) Anterior Nasal Swab     Status: None   Collection Time: 12/25/22 12:20 AM   Specimen: Anterior Nasal Swab  Result Value Ref Range Status   SARS Coronavirus 2 by RT PCR NEGATIVE NEGATIVE Final   Influenza A by PCR NEGATIVE NEGATIVE Final   Influenza B by PCR NEGATIVE NEGATIVE Final    Comment: (NOTE) The Xpert Xpress SARS-CoV-2/FLU/RSV plus assay is intended as an aid in the diagnosis of influenza from Nasopharyngeal swab specimens and should not be used as a sole basis for treatment. Nasal washings and aspirates are unacceptable for Xpert Xpress SARS-CoV-2/FLU/RSV testing.  Fact Sheet for Patients: EntrepreneurPulse.com.au  Fact Sheet for Healthcare Providers: IncredibleEmployment.be  This test is not yet approved or cleared by the [place] FDA and has been authorized for detection and/or diagnosis of SARS-CoV-2 by FDA under an Emergency Use Authorization (EUA). This EUA will remain in effect (meaning this test can be used) for the duration of the COVID-19 declaration under Section 564(b)(1) of the Act,  21 U.S.C. section 360bbb-3(b)(1), unless the authorization is terminated or revoked.     Resp Syncytial Virus by PCR NEGATIVE NEGATIVE Final    Comment: (NOTE) Fact Sheet for Patients: EntrepreneurPulse.com.au  Fact Sheet for  Healthcare Providers: IncredibleEmployment.be  This test is not yet approved or cleared by the [place] FDA and has been authorized for detection and/or diagnosis of SARS-CoV-2 by FDA under an Emergency Use Authorization (EUA). This EUA will remain in effect (meaning this test can be used) for the duration of the COVID-19 declaration under Section 564(b)(1) of the Act, 21 U.S.C. section 360bbb-3(b)(1), unless the authorization is terminated or revoked.  Performed at [facility], [place] [address], [place], [place] [address]   MRSA Next Gen by PCR, Nasal     Status: None   Collection Time: 12/25/22 12:46 AM   Specimen: Nasal Mucosa; Nasal Swab  Result Value Ref Range Status   MRSA by PCR Next Gen NOT DETECTED NOT DETECTED Final    Comment: (NOTE) The GeneXpert MRSA Assay (FDA approved for NASAL specimens only), is one component of a comprehensive MRSA colonization surveillance program. It is not intended to diagnose MRSA infection nor to guide or monitor treatment for MRSA infections. Test performance is not FDA approved in patients less than 66 years old. Performed at [facility], [place] [address] [place], [place] [address]      Time coordinating discharge: Over 30 minutes  SIGNED:   Donnamarie Poag [place] DO  [facility] 12/28/2022, 1:54 PM

## 2022-12-28 NOTE — Plan of Care (Signed)

## 2022-12-30 LAB — CULTURE, BLOOD (ROUTINE X 2)
Culture: NO GROWTH
Culture: NO GROWTH
Special Requests: ADEQUATE

## 2023-01-04 ENCOUNTER — Ambulatory Visit: Payer: 59 | Admitting: Neurology

## 2023-01-04 DIAGNOSIS — R569 Unspecified convulsions: Secondary | ICD-10-CM

## 2023-01-04 DIAGNOSIS — G40909 Epilepsy, unspecified, not intractable, without status epilepticus: Secondary | ICD-10-CM

## 2023-01-20 NOTE — Procedures (Signed)
   HISTORY: 67 year old male with recurrent seizure  TECHNIQUE:  This is a routine 16 channel EEG recording with one channel devoted to a limited EKG recording.  It was performed during wakefulness, drowsiness and asleep.  Photic stimulation were performed as activating procedures.  There are minimum muscle and movement artifact noted.  Upon maximum arousal, posterior dominant waking rhythm consistent of rhythmic alpha range activity. Activities are symmetric over the bilateral posterior derivations and attenuated with eye opening.  Hyperventilation was not performed,  Photic stimulation did not alter the tracing.  During EEG recording, patient developed drowsiness and no deeper stage of sleep was achieved During EEG recording, there was no epileptiform discharge noted.  EKG demonstrate normal sinus rhythm.  CONCLUSION: This is a  normal awake EEG.  There is no electrodiagnostic evidence of epileptiform discharge.  Marcial Pacas, M.D. Ph.D.  [facility] [place], [place] [address] Phone: [phone] Fax:      [phone]

## 2023-02-23 ENCOUNTER — Other Ambulatory Visit: Payer: Self-pay | Admitting: Gastroenterology

## 2023-02-23 DIAGNOSIS — K50818 Crohn's disease of both small and large intestine with other complication: Secondary | ICD-10-CM

## 2023-03-07 ENCOUNTER — Other Ambulatory Visit (HOSPITAL_COMMUNITY): Payer: Self-pay | Admitting: Gastroenterology

## 2023-03-07 DIAGNOSIS — K50818 Crohn's disease of both small and large intestine with other complication: Secondary | ICD-10-CM

## 2023-03-21 ENCOUNTER — Ambulatory Visit (HOSPITAL_COMMUNITY)
Admission: RE | Admit: 2023-03-21 | Discharge: 2023-03-21 | Disposition: A | Discharge status: Home / Self Care | Payer: 59 | Source: Ambulatory Visit | Attending: Gastroenterology | Admitting: Gastroenterology

## 2023-03-21 DIAGNOSIS — K50818 Crohn's disease of both small and large intestine with other complication: Secondary | ICD-10-CM | POA: Diagnosis not present

## 2023-08-12 ENCOUNTER — Emergency Department (HOSPITAL_COMMUNITY): Payer: 59

## 2023-08-12 ENCOUNTER — Encounter (HOSPITAL_COMMUNITY): Payer: Self-pay

## 2023-08-12 ENCOUNTER — Other Ambulatory Visit: Payer: Self-pay

## 2023-08-12 ENCOUNTER — Emergency Department (HOSPITAL_COMMUNITY)
Admission: EM | Admit: 2023-08-12 | Discharge: 2023-08-12 | Disposition: A | Discharge status: Home / Self Care | Payer: 59 | Attending: Emergency Medicine | Admitting: Emergency Medicine

## 2023-08-12 DIAGNOSIS — S01111A Laceration without foreign body of right eyelid and periocular area, initial encounter: Secondary | ICD-10-CM | POA: Diagnosis not present

## 2023-08-12 DIAGNOSIS — Z23 Encounter for immunization: Secondary | ICD-10-CM | POA: Diagnosis not present

## 2023-08-12 DIAGNOSIS — W19XXXA Unspecified fall, initial encounter: Secondary | ICD-10-CM | POA: Insufficient documentation

## 2023-08-12 DIAGNOSIS — S0990XA Unspecified injury of head, initial encounter: Secondary | ICD-10-CM | POA: Diagnosis present

## 2023-08-12 DIAGNOSIS — R7309 Other abnormal glucose: Secondary | ICD-10-CM | POA: Diagnosis not present

## 2023-08-12 DIAGNOSIS — Y92019 Unspecified place in single-family (private) house as the place of occurrence of the external cause: Secondary | ICD-10-CM | POA: Insufficient documentation

## 2023-08-12 LAB — TSH: TSH: 1.037 u[IU]/mL (ref 0.350–4.500)

## 2023-08-12 LAB — CBC WITH DIFFERENTIAL/PLATELET
Abs Immature Granulocytes: 0.01 10*3/uL (ref 0.00–0.07)
Basophils Absolute: 0 10*3/uL (ref 0.0–0.1)
Basophils Relative: 0 %
Eosinophils Absolute: 0 10*3/uL (ref 0.0–0.5)
Eosinophils Relative: 1 %
HCT: 40.1 % (ref 39.0–52.0)
Hemoglobin: 12.3 g/dL — ABNORMAL LOW (ref 13.0–17.0)
Immature Granulocytes: 0 %
Lymphocytes Relative: 27 %
Lymphs Abs: 1.5 10*3/uL (ref 0.7–4.0)
MCH: 27 pg (ref 26.0–34.0)
MCHC: 30.7 g/dL (ref 30.0–36.0)
MCV: 88.1 fL (ref 80.0–100.0)
Monocytes Absolute: 0.4 10*3/uL (ref 0.1–1.0)
Monocytes Relative: 7 %
Neutro Abs: 3.6 10*3/uL (ref 1.7–7.7)
Neutrophils Relative %: 65 %
Platelets: 185 10*3/uL (ref 150–400)
RBC: 4.55 MIL/uL (ref 4.22–5.81)
RDW: 14.7 % (ref 11.5–15.5)
WBC: 5.5 10*3/uL (ref 4.0–10.5)
nRBC: 0 % (ref 0.0–0.2)

## 2023-08-12 LAB — URINALYSIS, W/ REFLEX TO CULTURE (INFECTION SUSPECTED)
Bacteria, UA: NONE SEEN
Bilirubin Urine: NEGATIVE
Glucose, UA: NEGATIVE mg/dL
Hgb urine dipstick: NEGATIVE
Ketones, ur: NEGATIVE mg/dL
Leukocytes,Ua: NEGATIVE
Nitrite: NEGATIVE
Protein, ur: 30 mg/dL — AB
Specific Gravity, Urine: 1.02 (ref 1.005–1.030)
pH: 6 (ref 5.0–8.0)

## 2023-08-12 LAB — RAPID URINE DRUG SCREEN, HOSP PERFORMED
Amphetamines: NOT DETECTED
Barbiturates: NOT DETECTED
Benzodiazepines: NOT DETECTED
Cocaine: NOT DETECTED
Opiates: NOT DETECTED
Tetrahydrocannabinol: NOT DETECTED

## 2023-08-12 LAB — COMPREHENSIVE METABOLIC PANEL
ALT: 11 U/L (ref 0–44)
AST: 18 U/L (ref 15–41)
Albumin: 3.4 g/dL — ABNORMAL LOW (ref 3.5–5.0)
Alkaline Phosphatase: 51 U/L (ref 38–126)
Anion gap: 7 (ref 5–15)
BUN: 10 mg/dL (ref 8–23)
CO2: 27 mmol/L (ref 22–32)
Calcium: 8.7 mg/dL — ABNORMAL LOW (ref 8.9–10.3)
Chloride: 111 mmol/L (ref 98–111)
Creatinine, Ser: 0.88 mg/dL (ref 0.61–1.24)
GFR, Estimated: >60 mL/min (ref >60)
Glucose, Bld: 113 mg/dL — ABNORMAL HIGH (ref 70–99)
Potassium: 4.1 mmol/L (ref 3.5–5.1)
Sodium: 145 mmol/L (ref 135–145)
Total Bilirubin: 0.5 mg/dL (ref 0.3–1.2)
Total Protein: 6.8 g/dL (ref 6.5–8.1)

## 2023-08-12 LAB — ETHANOL: Alcohol, Ethyl (B): <10 mg/dL (ref <10)

## 2023-08-12 LAB — TROPONIN I (HIGH SENSITIVITY)
Troponin I (High Sensitivity): 4 ng/L (ref <18)
Troponin I (High Sensitivity): 3 ng/L (ref <18)

## 2023-08-12 MED ORDER — TETANUS-DIPHTH-ACELL PERTUSSIS 5-2.5-18.5 LF-MCG/0.5 IM SUSY
0.5000 mL | PREFILLED_SYRINGE | Freq: Once | INTRAMUSCULAR | Status: AC
  Administered 2023-08-12: 0.5 mL via INTRAMUSCULAR
  Filled 2023-08-12: qty 0.5

## 2023-08-12 NOTE — ED Provider Notes (Signed)
[place] EMERGENCY DEPARTMENT AT [facility] Provider Note   CSN: [id number] Arrival date & time: 08/12/23  1120     History  Chief Complaint  Patient presents with   [name] is a 67 y.o. male.  67 year old male with prior medical history as detailed below presents for evaluation.  Patient reports that he was at home.  He reports having a fall at home and striking his head.  He cannot completely recall the event.  He does not think he passed out but he is not 100% sure.  He did strike his right forehead and has a laceration to this area.  He is unsure of his last tetanus.  He denies other injury.  He denies associated chest pain, shortness of breath, nausea, palpitations, fever, other complaint.  Patient reports that he was seen in [place], [place] approximately 1 week ago and restarted on Keppra for seizure prophylaxis.  He does not feel as though he had a seizure today.  He denies injury to his tongue.  He denies bowel or bladder incontinence.  He denies any postictal episode.    His wife was downstairs and heard him fall.  When she came to the room, he was on the floor and did not appear to be postictal.    The history is provided by the patient and medical records.       Home Medications Prior to Admission medications   Medication Sig Start Date End Date Taking? Authorizing Provider  acetaminophen (TYLENOL) 325 MG tablet Take 2 tablets (650 mg total) by mouth every 6 (six) hours as needed for mild pain, moderate pain or headache. 12/28/22   [place], Alvira Philips, DO  atorvastatin (LIPITOR) 40 MG tablet Take 1 tablet (40 mg total) by mouth daily. 12/02/22   Lonia Blood, MD  Cholecalciferol (VITAMIN D-3 PO) Take 1 capsule by mouth in the morning.    [provider]  Cyanocobalamin (VITAMIN B-12 PO) Take 1 tablet by mouth in the morning.    [provider]  diazePAM (VALTOCO 10 MG DOSE) 10 MG/0.1ML LIQD Place 10 mg into  the nose as needed (seizure lasting greater than 5 minutes). 12/02/22   Lonia Blood, MD  HUMIRA, 2 PEN, 40 MG/0.4ML PNKT Inject 40 mg into the skin every 14 (fourteen) days. 11/17/22   [provider]  levETIRAcetam (KEPPRA) 750 MG tablet Take 1 tablet (750 mg total) by mouth 2 (two) times daily. 12/21/22   Levert Feinstein, MD  mesalamine (LIALDA) 1.2 g EC tablet Take 2.4 g by mouth 2 (two) times daily. 07/07/22   [provider]  metFORMIN (GLUCOPHAGE) 500 MG tablet Take 500 mg by mouth 2 (two) times daily. 05/23/21   [provider]  pantoprazole (PROTONIX) 40 MG tablet Take 1 tablet (40 mg total) by mouth daily. 12/28/22 03/28/23  [place], Alvira Philips, DO  potassium citrate (UROCIT-K) 10 MEQ (1080 MG) SR tablet Take 40 mEq by mouth 4 (four) times daily.    [provider]  tamsulosin (FLOMAX) 0.4 MG CAPS capsule Take 1 capsule (0.4 mg total) by mouth daily after supper. Patient taking differently: Take 0.4 mg by mouth in the morning. 01/12/17   Sherrie George, PA-C  Trospium Chloride 60 MG CP24 Take 60 mg by mouth in the morning. 06/04/22   [provider]      Allergies    Nsaids    Review of Systems   Review of Systems  All other systems reviewed and are negative.   Physical Exam Updated Vital Signs BP (!) 150/83   Pulse 72   Temp 98.2 F (36.8 C) (Oral)   Resp 18   Ht 5\' 6"  (1.676 m)   Wt 73.4 kg   SpO2 100%   BMI 26.12 kg/m  Physical Exam Vitals and nursing note reviewed.  Constitutional:      General: He is not in acute distress.    Appearance: Normal appearance. He is well-developed.  HENT:     Head: Normocephalic.     Comments: 1 cm laceration to the right lateral eyebrow. Eyes:     Conjunctiva/sclera: Conjunctivae normal.     Pupils: Pupils are equal, round, and reactive to light.  Cardiovascular:     Rate and Rhythm: Normal rate and regular rhythm.     Heart sounds: Normal heart sounds.  Pulmonary:     Effort: Pulmonary  effort is normal. No respiratory distress.     Breath sounds: Normal breath sounds.  Abdominal:     General: There is no distension.     Palpations: Abdomen is soft.     Tenderness: There is no abdominal tenderness.  Musculoskeletal:        General: No deformity. Normal range of motion.     Cervical back: Normal range of motion and neck supple.  Skin:    General: Skin is warm and dry.  Neurological:     General: No focal deficit present.     Mental Status: He is alert and oriented to person, place, and time.     ED Results / Procedures / Treatments   Labs (all labs ordered are listed, but only abnormal results are displayed) Labs Reviewed  CBC WITH DIFFERENTIAL/PLATELET  ETHANOL  COMPREHENSIVE METABOLIC PANEL  URINALYSIS, W/ REFLEX TO CULTURE (INFECTION SUSPECTED)  RAPID URINE DRUG SCREEN, HOSP PERFORMED  TSH  LEVETIRACETAM LEVEL  TROPONIN I (HIGH SENSITIVITY)    EKG None  Radiology No results found.  Procedures .Marland KitchenLaceration Repair  Date/Time: 08/12/2023 3:03 PM  Performed by: Wynetta Fines, MD Authorized by: Wynetta Fines, MD   Consent:    Consent obtained:  Verbal   Consent given by:  Patient   Risks, benefits, and alternatives were discussed: yes     Risks discussed:  Infection, need for additional repair, nerve damage, poor wound healing, poor cosmetic result, pain, retained foreign body, tendon damage and vascular damage   Alternatives discussed:  No treatment Universal protocol:    Immediately prior to procedure, a time out was called: yes     Patient identity confirmed:  Verbally with patient Anesthesia:    Anesthesia method:  None Laceration details:    Location:  Face   Face location:  R eyebrow   Length (cm):  1 Pre-procedure details:    Preparation:  Patient was prepped and draped in usual sterile fashion Exploration:    Limited defect created (wound extended): no     Hemostasis achieved with:  Direct pressure   Imaging outcome: foreign  body not noted     Wound exploration: wound explored through full range of motion     Contaminated: no   Treatment:    Area cleansed with:  Povidone-iodine   Amount of cleaning:  Standard   Irrigation solution:  Sterile saline   Debridement:  None   Undermining:  None   Scar revision: no   Skin repair:    Repair method:  Tissue adhesive Approximation:  Approximation:  Close Repair type:    Repair type:  Simple Post-procedure details:    Dressing:  Open (no dressing)   Procedure completion:  Tolerated     Medications Ordered in ED Medications  Tdap (BOOSTRIX) injection 0.5 mL (has no administration in time range)    ED Course/ Medical Decision Making/ A&P                                 Medical Decision Making Amount and/or Complexity of Data Reviewed Labs: ordered. Radiology: ordered.  Risk Prescription drug management.    Medical Screen Complete  This patient presented to the ED with complaint of fall, head injury.  This complaint involves an extensive number of treatment options. The initial differential diagnosis includes, but is not limited to, fall, forehead laceration, trauma from fall, metabolic abnormality, syncope versus near syncope, etc.  This presentation is: Acute, Chronic, Self-Limited, Previously Undiagnosed, Uncertain Prognosis, Complicated, Systemic Symptoms, and Threat to Life/Bodily Function  Reports fall at home.  Patient feels that he may have slipped and lost his balance.  He is unsure as to whether he may have passed out.  He denies any specific syncopal symptoms.  Patient with apparent history of seizure.  He is currently compliant with previously prescribed Keppra.  He denies postictal episode.  He denies biting his tongue or urinary incontinence.  Laceration to forehead repaired with Dermabond.  Screening labs and imaging of is without significant abnormality.  Patient observed in the ED for some period of time without significant  abnormality noted.  Patient feels much improved after evaluation.  He desires discharge home.  Importance of close follow-up in the outpatient setting is stressed.  Strict return precautions given and understood.  Additional history obtained:  Additional history obtained from [facility] External records from outside sources obtained and reviewed including prior ED visits and prior Inpatient records.    Lab Tests:  I ordered and personally interpreted labs.  The pertinent results include: CBC, CMP, EtOH, TSH, troponin x 2, UA, urine tox    Imaging Studies ordered:  I ordered imaging studies including  CT head, CT C-spine I independently visualized and interpreted obtained imaging which showed NAD I agree with the radiologist interpretation.   Cardiac Monitoring:  The patient was maintained on a cardiac monitor.  I personally viewed and interpreted the cardiac monitor which showed an underlying rhythm of: NSR   Medicines ordered:  I ordered medication including Tdap for prophylaxis Reevaluation of the patient after these medicines showed that the patient: improved    Problem List / ED Course:  Fall, head injury, forehead laceration   Reevaluation:  After the interventions noted above, I reevaluated the patient and found that they have: improved  Disposition:  After consideration of the diagnostic results and the patients response to treatment, I feel that the patent would benefit from close outpatient follow-up.          Final Clinical Impression(s) / ED Diagnoses Final diagnoses:  Fall, initial encounter  Injury of head, initial encounter    Rx / DC Orders ED Discharge Orders     None         Wynetta Fines, MD 08/12/23 1727

## 2023-08-12 NOTE — ED Triage Notes (Signed)
Patient is here for evaluation after having a fall. Pt was disoriented after falling, pt reports he thinks he slipped and fell but pt possibly had LOC. Unable to recall any events except waking up on the floor. Pt has laceration to the right temple, bleeding controlled.

## 2023-08-12 NOTE — Discharge Instructions (Signed)
Return for any problem.  ?

## 2023-08-13 LAB — CBG MONITORING, ED: Glucose-Capillary: 99 mg/dL (ref 70–99)

## 2023-08-14 ENCOUNTER — Ambulatory Visit (INDEPENDENT_AMBULATORY_CARE_PROVIDER_SITE_OTHER): Payer: 59 | Admitting: Neurology

## 2023-08-14 ENCOUNTER — Encounter: Payer: Self-pay | Admitting: Neurology

## 2023-08-14 VITALS — BP 146/82 | HR 86 | Ht 66.0 in | Wt 168.0 lb

## 2023-08-14 DIAGNOSIS — G40909 Epilepsy, unspecified, not intractable, without status epilepticus: Secondary | ICD-10-CM | POA: Diagnosis not present

## 2023-08-14 LAB — LEVETIRACETAM LEVEL: Levetiracetam Lvl: 15.6 ug/mL (ref 10.0–40.0)

## 2023-08-14 MED ORDER — LEVETIRACETAM 1000 MG PO TABS: 1000.0000 mg | ORAL_TABLET | Freq: Two times a day (BID) | ORAL | 3 refills | Status: DC
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
## 2023-08-15 DIAGNOSIS — Z0289 Encounter for other administrative examinations: Secondary | ICD-10-CM

## 2023-09-21 ENCOUNTER — Telehealth: Payer: Self-pay | Admitting: Neurology

## 2023-09-21 NOTE — Telephone Encounter (Addendum)
Pt's wife, Kassidy Koehnke (do not have DPR on file) Left DMV form 3 weeks ago. Checking on status of DMV form for the neurologist to fill out in regards going 6 months without a seizure. Would like a call back.

## 2023-09-25 NOTE — Telephone Encounter (Signed)
Patient also asking about EEG, you have placed order for 72 hr EEG but I do not see where paperwork was faxed, we do not do those in house. Do you want [place] to fax paperwork to [facility]?

## 2023-09-25 NOTE — Telephone Encounter (Signed)
Yes, ok to place 72 hours ambulatory eeg order

## 2023-09-25 NOTE — Telephone Encounter (Signed)
EEG Order form filled out, signed by Dr. Terrace Arabia and faxed to [facility] with insurance card, clinicals and previous EEG.

## 2023-11-01 ENCOUNTER — Ambulatory Visit: Payer: 59 | Admitting: Neurology

## 2024-03-03 ENCOUNTER — Ambulatory Visit (INDEPENDENT_AMBULATORY_CARE_PROVIDER_SITE_OTHER): Payer: 59 | Admitting: Adult Health

## 2024-03-03 ENCOUNTER — Encounter: Payer: Self-pay | Admitting: Adult Health

## 2024-03-03 VITALS — BP 136/77 | HR 73 | Ht 66.0 in | Wt 164.0 lb

## 2024-03-03 DIAGNOSIS — G40909 Epilepsy, unspecified, not intractable, without status epilepticus: Secondary | ICD-10-CM

## 2024-03-03 MED ORDER — LEVETIRACETAM 1000 MG PO TABS: 1000.0000 mg | ORAL_TABLET | Freq: Two times a day (BID) | ORAL | 3 refills | Status: DC
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
## 2024-03-03 NOTE — Patient Instructions (Addendum)
 Your Plan:  Continue Keppra  1000 mg twice daily for seizure prevention  Can look at GoodRx which is a prescription savings program - you can obtain Keppra  for $25 for 60 tablets (can be cheaper at other pharmacies, if you would like your prescription sent to a different pharmacy, please let me know)  Okay to return back to driving as it has been over 6 months since your prior seizure and as long as you remain seizure-free and compliant with your medications  Please call with any additional seizures     Follow-up in 1 year or call earlier if needed      Thank you for coming to see us  at [facility]. I hope we have been able to provide you high quality care today.  You may receive a patient satisfaction survey over the next few weeks. We would appreciate your feedback and comments so that we may continue to improve ourselves and the health of our patients.

## 2024-06-06 ENCOUNTER — Encounter (HOSPITAL_COMMUNITY): Payer: Self-pay

## 2024-06-06 ENCOUNTER — Emergency Department (HOSPITAL_COMMUNITY)

## 2024-06-06 ENCOUNTER — Other Ambulatory Visit: Payer: Self-pay

## 2024-06-06 ENCOUNTER — Observation Stay (HOSPITAL_COMMUNITY)
Admission: EM | Admit: 2024-06-06 | Discharge: 2024-06-07 | Disposition: A | Discharge status: Home / Self Care | Attending: Internal Medicine | Admitting: Internal Medicine

## 2024-06-06 DIAGNOSIS — Z79899 Other long term (current) drug therapy: Secondary | ICD-10-CM | POA: Insufficient documentation

## 2024-06-06 DIAGNOSIS — S066X0A Traumatic subarachnoid hemorrhage without loss of consciousness, initial encounter: Secondary | ICD-10-CM | POA: Insufficient documentation

## 2024-06-06 DIAGNOSIS — Z87891 Personal history of nicotine dependence: Secondary | ICD-10-CM | POA: Diagnosis not present

## 2024-06-06 DIAGNOSIS — E876 Hypokalemia: Secondary | ICD-10-CM | POA: Diagnosis present

## 2024-06-06 DIAGNOSIS — R569 Unspecified convulsions: Secondary | ICD-10-CM | POA: Diagnosis present

## 2024-06-06 DIAGNOSIS — Z794 Long term (current) use of insulin: Secondary | ICD-10-CM | POA: Diagnosis not present

## 2024-06-06 DIAGNOSIS — W010XXA Fall on same level from slipping, tripping and stumbling without subsequent striking against object, initial encounter: Secondary | ICD-10-CM | POA: Diagnosis not present

## 2024-06-06 DIAGNOSIS — N400 Enlarged prostate without lower urinary tract symptoms: Secondary | ICD-10-CM | POA: Diagnosis present

## 2024-06-06 DIAGNOSIS — G40901 Epilepsy, unspecified, not intractable, with status epilepticus: Principal | ICD-10-CM | POA: Insufficient documentation

## 2024-06-06 DIAGNOSIS — E119 Type 2 diabetes mellitus without complications: Secondary | ICD-10-CM | POA: Diagnosis not present

## 2024-06-06 DIAGNOSIS — I609 Nontraumatic subarachnoid hemorrhage, unspecified: Principal | ICD-10-CM

## 2024-06-06 HISTORY — DX: Unspecified convulsions: R56.9

## 2024-06-06 LAB — COMPREHENSIVE METABOLIC PANEL WITH GFR
ALT: 16 U/L (ref 0–44)
AST: 20 U/L (ref 15–41)
Albumin: 3 g/dL — ABNORMAL LOW (ref 3.5–5.0)
Alkaline Phosphatase: 51 U/L (ref 38–126)
Anion gap: 9 (ref 5–15)
BUN: 9 mg/dL (ref 8–23)
CO2: 26 mmol/L (ref 22–32)
Calcium: 8.5 mg/dL — ABNORMAL LOW (ref 8.9–10.3)
Chloride: 104 mmol/L (ref 98–111)
Creatinine, Ser: 1.1 mg/dL (ref 0.61–1.24)
GFR, Estimated: >60 mL/min (ref >60)
Glucose, Bld: 178 mg/dL — ABNORMAL HIGH (ref 70–99)
Potassium: 3.4 mmol/L — ABNORMAL LOW (ref 3.5–5.1)
Sodium: 139 mmol/L (ref 135–145)
Total Bilirubin: 0.7 mg/dL (ref 0.0–1.2)
Total Protein: 6.3 g/dL — ABNORMAL LOW (ref 6.5–8.1)

## 2024-06-06 LAB — URINALYSIS, ROUTINE W REFLEX MICROSCOPIC
Bacteria, UA: NONE SEEN
Bilirubin Urine: NEGATIVE
Glucose, UA: NEGATIVE mg/dL
Hgb urine dipstick: NEGATIVE
Ketones, ur: NEGATIVE mg/dL
Leukocytes,Ua: NEGATIVE
Nitrite: NEGATIVE
Protein, ur: >=300 mg/dL — AB
Specific Gravity, Urine: 1.014 (ref 1.005–1.030)
pH: 5 (ref 5.0–8.0)

## 2024-06-06 LAB — CBC
HCT: 40.8 % (ref 39.0–52.0)
Hemoglobin: 12.8 g/dL — ABNORMAL LOW (ref 13.0–17.0)
MCH: 28.3 pg (ref 26.0–34.0)
MCHC: 31.4 g/dL (ref 30.0–36.0)
MCV: 90.1 fL (ref 80.0–100.0)
Platelets: 162 K/uL (ref 150–400)
RBC: 4.53 MIL/uL (ref 4.22–5.81)
RDW: 13.4 % (ref 11.5–15.5)
WBC: 6.3 K/uL (ref 4.0–10.5)
nRBC: 0 % (ref 0.0–0.2)

## 2024-06-06 LAB — I-STAT CHEM 8, ED
BUN: 6 mg/dL — ABNORMAL LOW (ref 8–23)
Calcium, Ion: 1.06 mmol/L — ABNORMAL LOW (ref 1.15–1.40)
Chloride: 106 mmol/L (ref 98–111)
Creatinine, Ser: 1 mg/dL (ref 0.61–1.24)
Glucose, Bld: 111 mg/dL — ABNORMAL HIGH (ref 70–99)
HCT: 38 % — ABNORMAL LOW (ref 39.0–52.0)
Hemoglobin: 12.9 g/dL — ABNORMAL LOW (ref 13.0–17.0)
Potassium: 3.2 mmol/L — ABNORMAL LOW (ref 3.5–5.1)
Sodium: 143 mmol/L (ref 135–145)
TCO2: 23 mmol/L (ref 22–32)

## 2024-06-06 LAB — CBG MONITORING, ED
Glucose-Capillary: 140 mg/dL — ABNORMAL HIGH (ref 70–99)
Glucose-Capillary: 106 mg/dL — ABNORMAL HIGH (ref 70–99)

## 2024-06-06 LAB — HEMOGLOBIN A1C
Hgb A1c MFr Bld: 7.6 % — ABNORMAL HIGH (ref 4.8–5.6)
Mean Plasma Glucose: 171.42 mg/dL

## 2024-06-06 LAB — MAGNESIUM: Magnesium: 1.4 mg/dL — ABNORMAL LOW (ref 1.7–2.4)

## 2024-06-06 MED ORDER — LEVETIRACETAM (KEPPRA) 500 MG/5 ML ADULT IV PUSH
1000.0000 mg | Freq: Two times a day (BID) | INTRAVENOUS | Status: DC
  Administered 2024-06-06 – 2024-06-07 (×2): 1000 mg via INTRAVENOUS
  Filled 2024-06-06 (×2): qty 10

## 2024-06-06 MED ORDER — LEVETIRACETAM (KEPPRA) 500 MG/5 ML ADULT IV PUSH
1500.0000 mg | Freq: Once | INTRAVENOUS | Status: AC
  Administered 2024-06-06: 1500 mg via INTRAVENOUS
  Filled 2024-06-06: qty 15

## 2024-06-06 MED ORDER — ONDANSETRON HCL 4 MG/2ML IJ SOLN
4.0000 mg | Freq: Once | INTRAMUSCULAR | Status: AC
  Administered 2024-06-06: 4 mg via INTRAVENOUS
  Filled 2024-06-06: qty 2

## 2024-06-06 MED ORDER — LABETALOL HCL 5 MG/ML IV SOLN: 10.0000 mg | INTRAVENOUS | Status: DC | PRN

## 2024-06-06 MED ORDER — NALOXONE HCL 0.4 MG/ML IJ SOLN: 0.4000 mg | INTRAMUSCULAR | Status: DC | PRN

## 2024-06-06 MED ORDER — GADOBUTROL 1 MMOL/ML IV SOLN
8.0000 mL | Freq: Once | INTRAVENOUS | Status: AC | PRN
  Administered 2024-06-06: 8 mL via INTRAVENOUS

## 2024-06-06 MED ORDER — ORAL CARE MOUTH RINSE: 15.0000 mL | OROMUCOSAL | Status: DC | PRN

## 2024-06-06 MED ORDER — MELATONIN 3 MG PO TABS
3.0000 mg | ORAL_TABLET | Freq: Every evening | ORAL | Status: DC | PRN
  Administered 2024-06-06: 3 mg via ORAL
  Filled 2024-06-06: qty 1

## 2024-06-06 MED ORDER — FENTANYL CITRATE PF 50 MCG/ML IJ SOSY
50.0000 ug | PREFILLED_SYRINGE | Freq: Once | INTRAMUSCULAR | Status: AC
  Administered 2024-06-06: 50 ug via INTRAVENOUS
  Filled 2024-06-06: qty 1

## 2024-06-06 MED ORDER — ONDANSETRON HCL 4 MG/2ML IJ SOLN
4.0000 mg | Freq: Four times a day (QID) | INTRAMUSCULAR | Status: DC | PRN
  Administered 2024-06-06: 4 mg via INTRAVENOUS
  Filled 2024-06-06 (×2): qty 2

## 2024-06-06 MED ORDER — INSULIN ASPART 100 UNIT/ML IJ SOLN
0.0000 [IU] | Freq: Three times a day (TID) | INTRAMUSCULAR | Status: DC
  Administered 2024-06-07: 3 [IU] via SUBCUTANEOUS
  Administered 2024-06-07: 1 [IU] via SUBCUTANEOUS

## 2024-06-06 MED ORDER — INSULIN ASPART 100 UNIT/ML IJ SOLN: 0.0000 [IU] | Freq: Every day | INTRAMUSCULAR | Status: DC

## 2024-06-06 MED ORDER — FENTANYL CITRATE PF 50 MCG/ML IJ SOSY
25.0000 ug | PREFILLED_SYRINGE | INTRAMUSCULAR | Status: DC | PRN
  Administered 2024-06-07 (×2): 25 ug via INTRAVENOUS
  Filled 2024-06-06 (×2): qty 1

## 2024-06-06 MED ORDER — LORAZEPAM 2 MG/ML IJ SOLN: 1.0000 mg | INTRAMUSCULAR | Status: DC | PRN

## 2024-06-06 MED ORDER — ACETAMINOPHEN 325 MG PO TABS
650.0000 mg | ORAL_TABLET | Freq: Four times a day (QID) | ORAL | Status: DC | PRN
  Administered 2024-06-06 – 2024-06-07 (×2): 650 mg via ORAL
  Filled 2024-06-06 (×2): qty 2

## 2024-06-06 MED ORDER — ACETAMINOPHEN 650 MG RE SUPP
650.0000 mg | Freq: Four times a day (QID) | RECTAL | Status: DC | PRN
Start: 2024-06-06 — End: 2024-06-07

## 2024-06-06 MED ORDER — POTASSIUM CHLORIDE CRYS ER 20 MEQ PO TBCR
40.0000 meq | EXTENDED_RELEASE_TABLET | Freq: Once | ORAL | Status: AC
  Administered 2024-06-06: 40 meq via ORAL
  Filled 2024-06-06: qty 2

## 2024-06-06 NOTE — ED Provider Notes (Signed)
 [place] EMERGENCY DEPARTMENT AT [facility] Provider Note   CSN: [id number] Arrival date & time: 06/06/24  1215     Patient presents with: Seizures   [name] is a 68 y.o. male.   67 year old male with history of seizure disorder presents after having witnessed seizure at home.  Patient had a seizure also yesterday.  States that he is on Keppra  but has missed his dose for the past 4 days.  When he had seizures today, he fell from a standing position.  Struck his head.  Complains of mild headache with some mild neck pain.  Denies any weakness in his arms or legs.  Has not had any emesis.  No recent illnesses.  Family called EMS who presented patient in a c-collar       Prior to Admission medications   Medication Sig Start Date End Date Taking? Authorizing Provider  acetaminophen  (TYLENOL ) 325 MG tablet Take 2 tablets (650 mg total) by mouth every 6 (six) hours as needed for mild pain, moderate pain or headache. 12/28/22   [place], Camellia JINNY, DO  atorvastatin  (LIPITOR) 40 MG tablet Take 1 tablet (40 mg total) by mouth daily. 12/02/22   Danton Reyes DASEN, MD  Cholecalciferol (VITAMIN D -3 PO) Take 1 capsule by mouth in the morning.    [provider]  Cyanocobalamin (VITAMIN B-12 PO) Take 1 tablet by mouth in the morning.    [provider]  diazePAM  (VALTOCO  10 MG DOSE) 10 MG/0.1ML LIQD Place 10 mg into the nose as needed (seizure lasting greater than 5 minutes). 12/02/22   Danton Reyes DASEN, MD  HUMIRA, 2 PEN, 40 MG/0.4ML PNKT Inject 40 mg into the skin every 14 (fourteen) days. 11/17/22   [provider]  levETIRAcetam  (KEPPRA ) 1000 MG tablet Take 1 tablet (1,000 mg total) by mouth 2 (two) times daily. 03/03/24   Whitfield Raisin, NP  metFORMIN  (GLUCOPHAGE ) 500 MG tablet Take 500 mg by mouth 2 (two) times daily. 05/23/21   [provider]  pantoprazole  (PROTONIX ) 40 MG tablet Take 1 tablet (40 mg total) by mouth daily. 12/28/22 03/28/23   [place], Camellia JINNY, DO  potassium citrate  (UROCIT-K ) 10 MEQ (1080 MG) SR tablet Take 40 mEq by mouth 4 (four) times daily.    [provider]  tamsulosin  (FLOMAX ) 0.4 MG CAPS capsule Take 1 capsule (0.4 mg total) by mouth daily after supper. Patient taking differently: Take 0.4 mg by mouth in the morning. 01/12/17   Tonnie George, PA-C  Trospium  Chloride 60 MG CP24 Take 60 mg by mouth in the morning. 06/04/22   [provider]    Allergies: Nsaids    Review of Systems  All other systems reviewed and are negative.   Updated Vital Signs BP 138/81   Pulse 70   Temp (!) 97.5 F (36.4 C) (Oral)   Resp 16   Ht 1.676 m (5' 6)   Wt 81.6 kg   SpO2 100%   BMI 29.05 kg/m   Physical Exam Vitals and nursing note reviewed.  Constitutional:      General: He is not in acute distress.    Appearance: Normal appearance. He is well-developed. He is not toxic-appearing.  HENT:     Head: Normocephalic and atraumatic.  Eyes:     General: Lids are normal.     Conjunctiva/sclera: Conjunctivae normal.     Pupils: Pupils are equal, round, and reactive to light.  Neck:     Thyroid : No thyroid  mass.  Trachea: No tracheal deviation.  Cardiovascular:     Rate and Rhythm: Normal rate and regular rhythm.     Heart sounds: Normal heart sounds. No murmur heard.    No gallop.  Pulmonary:     Effort: Pulmonary effort is normal. No respiratory distress.     Breath sounds: Normal breath sounds. No stridor. No decreased breath sounds, wheezing, rhonchi or rales.  Abdominal:     General: There is no distension.     Palpations: Abdomen is soft.     Tenderness: There is no abdominal tenderness. There is no rebound.  Musculoskeletal:        General: No tenderness. Normal range of motion.     Cervical back: Spinous process tenderness and muscular tenderness present.  Skin:    General: Skin is warm and dry.     Findings: No abrasion or rash.  Neurological:     General: No focal deficit  present.     Mental Status: He is alert and oriented to person, place, and time. Mental status is at baseline.     GCS: GCS eye subscore is 4. GCS verbal subscore is 5. GCS motor subscore is 6.     Cranial Nerves: No cranial nerve deficit.     Sensory: No sensory deficit.     Motor: Motor function is intact.  Psychiatric:        Attention and Perception: Attention normal.        Speech: Speech normal.        Behavior: Behavior normal.     (all labs ordered are listed, but only abnormal results are displayed) Labs Reviewed  CBG MONITORING, ED - Abnormal; Notable for the following components:      Result Value   Glucose-Capillary 140 (*)    All other components within normal limits  COMPREHENSIVE METABOLIC PANEL WITH GFR  CBC  URINALYSIS, ROUTINE W REFLEX MICROSCOPIC  LEVETIRACETAM  LEVEL  CBG MONITORING, ED    EKG: EKG Interpretation Date/Time:  Friday June 06 2024 12:18:57 EDT Ventricular Rate:  91 PR Interval:  157 QRS Duration:  91 QT Interval:  396 QTC Calculation: 485 R Axis:   -24  Text Interpretation: Sinus rhythm Borderline left axis deviation Minimal ST elevation, anterior leads Borderline prolonged QT interval No significant change since last tracing Confirmed by Dasie Faden ([id number]) on 06/06/2024 12:44:31 PM  Radiology: No results found.   Procedures   Medications Ordered in the ED  levETIRAcetam  (KEPPRA ) undiluted injection 1,500 mg (has no administration in time range)                                    Medical Decision Making Amount and/or Complexity of Data Reviewed Labs: ordered. Radiology: ordered. ECG/medicine tests: ordered.  Risk Prescription drug management.   Patient's EKG shows sinus rhythm.  CT of head and cervical spine showed no fracture but yet show a focal subcortical/subcortical hemorrhage.  Discussed with Dr. Lindzen from neurology.  Patient loaded with Keppra  1.5 g.  Recommend MRI of brain with and without.  Patient sensorium  has improved.  Cervical collar removed by nursing.  Care turned over to Dr. Lenor     Final diagnoses:  None    ED Discharge Orders     None          Dasie Faden, MD 06/06/24 [phone]

## 2024-06-06 NOTE — ED Notes (Signed)
 Patient transported to CT

## 2024-06-06 NOTE — H&P (Signed)
 History and Physical      [name] FMW:[id number] DOB: Apr 13, 1956 DOA: 06/06/2024; DOS: 06/06/2024  PCP: Austin Mutton, MD  Patient coming from: home   I have personally briefly reviewed patient's old medical records in [facility]  Chief Complaint: Breakthrough seizure  HPI: [name] is a 68 y.o. male with medical history significant for seizures, Crohn's disease, type 2 diabetes mellitus, who is admitted to [facility] on 06/06/2024 with breakthrough seizure after presenting from home, to [facility] ED for evaluation of seizure.   Patient has a documented history of seizures, which are reported to have been well-controlled with Keppra  1 g p.o. twice daily.  However, the patient reports that due to complications with insurance coverage, that he has been off of his Keppra  for the last 4 days.   Earlier today, while at home, he reportedly experienced an episode of generalized tonic-clonic activity witnessed by family member, with this tonic-clonic activity lasting approximately 5 minutes before spontaneously resolving in the absence of any interval pharmacologic intervention.  He was noted to have considering diminished responsiveness, this mostly improved, but the patient now reported to be back to his baseline mental status.  No inciting seizure-like activity noted, including no additional tonic-clonic activity throughout his ED course.  It is reported that the patient experienced a, will fall as a consequence of his seizure, striking the right portion of his head.  He reports a mild residual headache, along with some intermittent nausea, but denies any associated acute focal weakness or any associated acute focal numbness, paresthesias, vertigo, acute change in vision, facial droop, dysarthria, or expressive aphasia.  Denies any use of recreational drugs, nor any chronic alcohol  abuse.  No recent subjective fever, chills, rigors, generalized myalgias.  Denies any recent neck  stiffness.  He is not on any blood thinners as an outpatient, including no aspirin .  His medical history is also notable for type 2 diabetes mellitus, for which she is on metformin  as an outpatient.  Not on any insulin  or sulfonylurea medications at home.     ED Course:  Vital signs in the ED were notable for the following: Afebrile; heart rates in the 70s to 90s; systolic pressures in the 130s 150s; respiratory rate 16-23, oxygen saturation 98 to 100% on room air.  Labs were notable for the following: CMP was notable for the following: Sodium 139, potassium 3.4, bicarbonate 26, creatinine 1.0, glucose 178, calcium  adjusted for mild hypoalbuminemia noted to be 9.3, albumin  3.0.  Otherwise, liver enzymes are within normal limits.  Magnesium  level 1.4.  CBC notable for open cell count 6300, hemoglobin 10.8.  Urinalysis showed no blood cells was not associated/nitrate negative.  Keppra  level has been ordered, Therazole currently pending.  Per my interpretation, EKG in ED demonstrated the following: Sinus rhythm with heart rate 91, normal intervals, no evidence of T wave changes, nonspecific less than 1 mm ST elevation in leads V1, and V2, otherwise showing no evidence of ST changes, including no evidence of ST elevation.  Imaging in the ED, per corresponding formal radiology read, was notable for the following: Noncontrast CT head showed an area of increased density along the junction of the right parietal and temporal lobes, concerning for focal cortical/subcortical hemorrhage, will demonstrate no evidence of acute infarct.  CT cervical spine showed no subacute cervical spine fracture or subluxation injury.  MRI brain with and without contrast showed small volume acute subarachnoid hemorrhage scattered along the bilateral cerebral convexities and within  the right sylvian fissure, in the absence of any corresponding evidence of cerebral edema, midline shift, or mass effect.  MRI brain showed no evidence  of acute infarct, and demonstrated right parieto-occipital scalp hematoma.  EDP d/w on neurosurgery, Dr. Gillie, who reviewed the imaging and did not appreciate the minuscule subdural hematoma, conveying that the initial radiology read of a small subdural hematoma may have been on the basis of artifact. Dr. Gillie did not feel that a repeat CT head or additional neurosurgical involvement were warranted at this time.  Additionally, EDP discussed patient's case with on-call neurology, Dr. Voncile, who recommends resumption of the patient's home Keppra  1 g p.o. twice daily, seizure precautions, and outpatient follow-up with neurology.  Additionally, Dr. Arora recommends that if patient has an additional seizure while in the hospital, that the neurology service be contacted to request formal consult at that time.  While in the ED, the following were administered: Fentanyl  50 mcg IV x 2 doses, Keppra  1.5 mg IV x 1 dose, Zofran  4 mg grams IV x 1 dose.  Subsequently, the patient was admitted for further evaluation management of breakthrough seizure, as well as potential small left subarachnoid hemorrhage, with presenting labs also notable for hypokalemia as well as hypomagnesemia.     Review of Systems: As per HPI otherwise 10 point review of systems negative.   Past Medical History:  Diagnosis Date   Abdominal wall abscess 01/03/2017   Arthritis    Bladder stone    Cancer (HCC)    mycosis fungoides   Crohn's disease (HCC)    Crohn's disease with abscess (HCC) 07/23/2015   Diabetes mellitus without complication (HCC)    History of aseptic necrosis of bone BILATERAL HIPS   S/P BONE GRAFT   History of kidney stones    History of mycosis fungoides    S/P ileostomy (HCC)     Past Surgical History:  Procedure Laterality Date   BONE GRAFT OF LEFT HIP  11/13/1984   ASEPTIC NECROSIS   COLONOSCOPY     CYSTO/ BILATERAL RETROGRADE PYELOGRAM/ LEFT URETERAL STONE EXTRACTION / BILATERAL STENT PLACEMENT   10/07/2003   CYSTOSCOPY WITH LITHOLAPAXY  02/12/2012   Procedure: CYSTOSCOPY WITH LITHOLAPAXY;  Surgeon: Mark C Ottelin, MD;  Location: [facility];  Service: Urology;  Laterality: N/A;   EXPL. LAP. W/ ENTEROLYSIS, RESECTION INFLAMMATORY MASS RLQ / TAKE-DOWN OF FISTULA WITH ENTEROENTEROSTOMY/ RESECTION WITH THE SMALL BOWEL TO ASCENDING COLON ANASTOMOSIS  10/29/2000   CROHN'S DISEASE W/ ANASTOMOTIC INFLAMMATORY MASS/    10-30-2000 EXPL. LAP. CONTROL POST-OP ABD. BLEEDING   EXPLORATORY LAPAROTOMY/ RESECTION OF ILEOCOLONIC ANASTOMOSIS AND CREATION OF ILEOSTOMY  11/04/2000   ABD. PERFORATION   HARDWARE REMOVAL Left 08/07/2022   Procedure: HARDWARE REMOVAL;  Surgeon: Yvone Rush, MD;  Location: [facility];  Service: Orthopedics;  Laterality: Left;   ILEOCECETOMY  1983   [employer] [facility]   ILEOSTOMY CLOSURE N/A 07/23/2015   Procedure: Takedown ileostomy and repair of ostomy hernia, extensive entrolysis (2.5 hrs), ileostransverse colon anastomosis; closure of massive ventral hernia with 20 x 30 Strattice mesh;  Surgeon: Donnice Lunger, MD;  Location: [facility];  Service: General;  Laterality: N/A;   INCISIONAL HERNIA REPAIR  07/23/2015   Strattice mesh   IRRIGATION AND DEBRIDEMENT ABSCESS Right 09/09/2014   Procedure: MINOR INCISION AND DRAINAGE OF ABSCESS;  Surgeon: Donnice Robinsons, MD;  Location: [facility];  Service: Orthopedics;  Laterality: Right;  incision and drainage right long finger  IRRIGATION AND DEBRIDEMENT ABSCESS N/A 01/03/2017   Procedure: IRRIGATION AND DEBRIDEMENT ABDOMINAL WALL ABSCESS;  Surgeon: Donnice Lunger, MD;  Location: [facility];  Service: General;  Laterality: N/A;   LAPAROSCOPY N/A 07/23/2015   Procedure: LAPAROSCOPY DIAGNOSTIC;  Surgeon: Donnice Lunger, MD;  Location: [facility];  Service: General;  Laterality: N/A;   ORIF ANKLE FRACTURE Left 08/27/2014   Procedure:  open reduction internal fixation left ankle;  Surgeon: Eva Elsie Herring, MD;  Location: [facility];  Service: Orthopedics;  Laterality: Left;  Left open reduction internal fixation ankle   RIGHT URETEROSCOPIC STONE EXTRACITON  04-24-2005  & 12-01-2002   TOTAL HIP ARTHROPLASTY Left 08/07/2022   Procedure: TOTAL HIP ARTHROPLASTY ANTERIOR APPROACH;  Surgeon: Yvone Rush, MD;  Location: [facility];  Service: Orthopedics;  Laterality: Left;    Social History:  reports that he quit smoking about 29 years ago. His smoking use included cigarettes. He has never used smokeless tobacco. He reports that he does not drink alcohol  and does not use drugs.   Allergies  Allergen Reactions   Nsaids Other (See Comments)    Crohn's disease, told to avoid NSAIDs    Family History  Problem Relation Age of Onset   Alzheimer's disease Father    Cancer Father    Cancer Sister     Family history reviewed and not pertinent    Prior to Admission medications   Medication Sig Start Date End Date Taking? Authorizing Provider  acetaminophen  (TYLENOL ) 325 MG tablet Take 2 tablets (650 mg total) by mouth every 6 (six) hours as needed for mild pain, moderate pain or headache. 12/28/22   [place], Camellia PARAS, DO  atorvastatin  (LIPITOR) 40 MG tablet Take 1 tablet (40 mg total) by mouth daily. 12/02/22   Danton Reyes DASEN, MD  Cholecalciferol (VITAMIN D -3 PO) Take 1 capsule by mouth in the morning.    [provider]  Cyanocobalamin (VITAMIN B-12 PO) Take 1 tablet by mouth in the morning.    [provider]  diazePAM  (VALTOCO  10 MG DOSE) 10 MG/0.1ML LIQD Place 10 mg into the nose as needed (seizure lasting greater than 5 minutes). 12/02/22   Danton Reyes DASEN, MD  HUMIRA, 2 PEN, 40 MG/0.4ML PNKT Inject 40 mg into the skin every 14 (fourteen) days. 11/17/22   [provider]  levETIRAcetam  (KEPPRA ) 1000 MG tablet Take 1 tablet (1,000 mg total) by mouth 2 (two) times daily. 03/03/24   Whitfield Raisin, NP  metFORMIN  (GLUCOPHAGE ) 500 MG tablet Take 500 mg by mouth 2 (two)  times daily. 05/23/21   [provider]  pantoprazole  (PROTONIX ) 40 MG tablet Take 1 tablet (40 mg total) by mouth daily. 12/28/22 03/28/23  [place], Camellia PARAS, DO  potassium citrate  (UROCIT-K ) 10 MEQ (1080 MG) SR tablet Take 40 mEq by mouth 4 (four) times daily.    [provider]  tamsulosin  (FLOMAX ) 0.4 MG CAPS capsule Take 1 capsule (0.4 mg total) by mouth daily after supper. Patient taking differently: Take 0.4 mg by mouth in the morning. 01/12/17   Tonnie George, PA-C  Trospium  Chloride 60 MG CP24 Take 60 mg by mouth in the morning. 06/04/22   [provider]     Objective    Physical Exam: Vitals:   06/06/24 1745 06/06/24 1800 06/06/24 1815 06/06/24 1820  BP: 136/81 (!) 152/84 (!) 179/98 136/84  Pulse:    78  Resp: 18 18 15 20   Temp:    98.4 F (36.9 C)  TempSrc:  Oral  SpO2: 100%   100%  Weight:      Height:        General: appears to be stated age; alert, oriented Skin: warm, dry, no rash Head:  AT/[place] Mouth:  Oral mucosa membranes appear moist, normal dentition Neck: supple; trachea midline Heart:  RRR; did not appreciate any M/R/G Lungs: CTAB, did not appreciate any wheezes, rales, or rhonchi Abdomen: + BS; soft, ND, NT Vascular: 2+ pedal pulses b/l; 2+ radial pulses b/l Extremities: no peripheral edema, no muscle wasting   Labs on Admission: I have personally reviewed following labs and imaging studies  CBC: Recent Labs  Lab 06/06/24 1221 06/06/24 1910  WBC 6.3  --   HGB 12.8* 12.9*  HCT 40.8 38.0*  MCV 90.1  --   PLT 162  --    Basic Metabolic Panel: Recent Labs  Lab 06/06/24 1221 06/06/24 1910  NA 139 143  K 3.4* 3.2*  CL 104 106  CO2 26  --   GLUCOSE 178* 111*  BUN 9 6*  CREATININE 1.10 1.00  CALCIUM  8.5*  --    GFR: Estimated Creatinine Clearance: 69.9 mL/min (by C-G formula based on SCr of 1 mg/dL). Liver Function Tests: Recent Labs  Lab 06/06/24 1221  AST 20  ALT 16  ALKPHOS 51  BILITOT 0.7  PROT 6.3*   ALBUMIN  3.0*   No results for input(s): LIPASE, AMYLASE in the last 168 hours. No results for input(s): AMMONIA in the last 168 hours. Coagulation Profile: No results for input(s): INR, PROTIME in the last 168 hours. Cardiac Enzymes: No results for input(s): CKTOTAL, CKMB, CKMBINDEX, TROPONINI in the last 168 hours. BNP (last 3 results) No results for input(s): PROBNP in the last 8760 hours. HbA1C: No results for input(s): HGBA1C in the last 72 hours. CBG: Recent Labs  Lab 06/06/24 1216  GLUCAP 140*   Lipid Profile: No results for input(s): CHOL, HDL, LDLCALC, TRIG, CHOLHDL, LDLDIRECT in the last 72 hours. Thyroid  Function Tests: No results for input(s): TSH, T4TOTAL, FREET4, T3FREE, THYROIDAB in the last 72 hours. Anemia Panel: No results for input(s): VITAMINB12, FOLATE, FERRITIN, TIBC, IRON, RETICCTPCT in the last 72 hours. Urine analysis:    Component Value Date/Time   COLORURINE YELLOW 06/06/2024 1241   APPEARANCEUR CLEAR 06/06/2024 1241   LABSPEC 1.014 06/06/2024 1241   PHURINE 5.0 06/06/2024 1241   GLUCOSEU NEGATIVE 06/06/2024 1241   HGBUR NEGATIVE 06/06/2024 1241   BILIRUBINUR NEGATIVE 06/06/2024 1241   KETONESUR NEGATIVE 06/06/2024 1241   PROTEINUR >=300 (A) 06/06/2024 1241   UROBILINOGEN 0.2 02/16/2009 1931   NITRITE NEGATIVE 06/06/2024 1241   LEUKOCYTESUR NEGATIVE 06/06/2024 1241    Radiological Exams on Admission: MR Brain W and Wo Contrast Result Date: 06/06/2024 CLINICAL DATA:  Provided history: Mental status change, unknown cause. Additional history provided: Known seizure disorder with witnessed seizure at home. Fall (with head trauma) during seizure. EXAM: MRI HEAD WITHOUT AND WITH CONTRAST TECHNIQUE: Multiplanar, multiecho pulse sequences of the brain and surrounding structures were obtained without and with intravenous contrast. CONTRAST:  8mL GADAVIST  GADOBUTROL  1 MMOL/ML IV SOLN COMPARISON:  Head  CT 06/06/2024.  Brain MRI 12/20/2022. FINDINGS: Brain: Mild generalized cerebral volume loss. Small-volume acute subarachnoid hemorrhage along the right temporoparietal junction and within the posterior aspect of the right sylvian fissure. Additionally, there is this corresponds with the focus of hyperdensity described on the head CT performed earlier today. Small-volume acute subarachnoid hemorrhage scattered elsewhere along the bilateral frontal and parietal convexities. 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 Background mild-to-moderate for age multifocal T2 FLAIR hyperintense signal abnormality within the cerebral white matter and pons, nonspecific but compatible chronic small vessel ischemic disease. No cortical encephalomalacia is identified. No appreciable hippocampal size or signal asymmetry. There is no acute infarct. No evidence of an intracranial mass. No midline shift or hydrocephalus. No pathologic intracranial enhancement identified. Vascular: Maintained flow voids within the proximal large arterial vessels. Skull and upper cervical spine: No focal worrisome marrow lesion. Incompletely assessed cervical spondylosis. Sinuses/Orbits: No mass or acute finding within the imaged orbits. No significant paranasal sinus disease. Other: Right parietooccipital scalp hematoma. Trace fluid within bilateral mastoid air cells. Impression #1 called by telephone at the time of interpretation on 06/06/2024 at 5:50 pm to provider Dr. Lenor, who verbally acknowledged these results. IMPRESSION: 1. Small-volume acute subarachnoid hemorrhage scattered along the bilateral cerebral convexities, and within the right sylvian fissure. 2. Background parenchymal atrophy and chronic small vessel ischemic disease, stable as compared to the MRI of 12/20/2022. 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 3. Mild generalized cerebral volume loss. 4. Right  parietooccipital scalp hematoma. Electronically Signed   By: Rockey Childs D.O.   On: 06/06/2024 17:54   CT Head Wo Contrast Addendum Date: 06/06/2024 ADDENDUM REPORT: 06/06/2024 13:51 ADDENDUM: Traumatic Brain Injury Risk Stratification Skull Fracture: No - Low/mBIG 1 Subdural Hematoma (SDH): No - Low Subarachnoid Hemorrhage [facility]): No Epidural Hematoma (EDH): No - Low/mBIG 1 Cerebral contusion, intra-axial, intraparenchymal Hemorrhage (IPH): Yes Intraventricular Hemorrhage (IVH): No - Low/mBIG 1 Midline Shift > 1mm or Edema/effacement of sulci/vents: No - Low/mBIG 1 ---------------------------------------------------- These results were called by telephone at the time of interpretation on 06/06/2024 at 1:50 pm to provider Dr. CURTISTINE DAWN , who verbally acknowledged these results. Electronically Signed   By: Evalene Coho M.D.   On: 06/06/2024 13:51   Result Date: 06/06/2024 CLINICAL DATA:  Head trauma, intracranial arterial injury suspected EXAM: CT HEAD WITHOUT CONTRAST TECHNIQUE: Contiguous axial images were obtained from the base of the skull through the vertex without intravenous contrast. RADIATION DOSE REDUCTION: This exam was performed according to the departmental dose-optimization program which includes automated exposure control, adjustment of the mA and/or kV according to patient size and/or use of iterative reconstruction technique. COMPARISON:  CT of the head dated August 12, 2023. FINDINGS: Brain: There is a new area of increased attenuation present at the right parietotemporal junction along the posterior margin of the sylvian fissure, which appears represent cortical and subcortical hemorrhage or calcification. There are prominent dural calcifications along the cerebral vertices. No other definite areas of intracranial hemorrhage. Vascular: Mild vascular calcifications. Skull: Intact and unremarkable. Sinuses/Orbits: Clear paranasal sinuses.  Normal orbits. Other: None. IMPRESSION: 1.  There is a new area of increased density seen along the junction of the right parietal and temporal lobes, which is concerning for focal cortical/subcortical hemorrhage. Electronically Signed: By: Evalene Coho M.D. On: 06/06/2024 13:45   CT Cervical Spine Wo Contrast Result Date: 06/06/2024 CLINICAL DATA:  Neck trauma (Age >= 65y) EXAM: CT CERVICAL SPINE WITHOUT CONTRAST TECHNIQUE: Multidetector CT imaging of the cervical spine was performed without intravenous contrast. Multiplanar CT image reconstructions were also generated. RADIATION DOSE REDUCTION: This exam was performed according to the departmental dose-optimization program which includes automated exposure control, adjustment of the mA and/or kV according to patient size and/or use of iterative reconstruction technique. COMPARISON:  None Available. FINDINGS: Alignment: Normal. Skull base and vertebrae:  No acute fracture. No primary bone lesion or focal pathologic process. Soft tissues and spinal canal: No prevertebral fluid or swelling. No visible canal hematoma. Disc levels: Moderate chronic degenerative disc disease at C4-5, C5-6 and C6-7, with mild-to-moderate central spinal canal stenosis at each level. There is also mild chronic degenerative disc disease at C3-4, with mild central spinal canal stenosis. Upper chest: The lung apices are clear. Other: None. IMPRESSION: Multilevel chronic degenerative disc disease. No evidence of acute traumatic injury. Electronically Signed   By: Evalene Coho M.D.   On: 06/06/2024 13:49      Assessment/Plan   Principal Problem:   Seizure (HCC) Active Problems:   Hypokalemia   Hypomagnesemia   DM2 (diabetes mellitus, type 2) (HCC)   BPH (benign prostatic hyperplasia)   SAH (subarachnoid hemorrhage) (HCC)     #) Breakthrough generalized tonic-clonic seizure: In the context of a history of seizures, patient presents with a single breakthrough tonic-clonic seizure as witnessed by family at  home associated with postictal state, with ensuing improvement in mental status now back to baseline.  It appears that this breakthrough seizure has occurred in the setting of patient's inability to procure his Keppra  over the last 4 days due to complications relating to availability through insurance coverage.  Keppra  level pending at this time.  He is also noted to have mild hypomagnesemia, as further detailed below.  No additional contributory electrolyte abnormalities identified.  No evidence of acute infarct on CT head or MRI brain to suggest contributory acute ischemic stroke.  There is question of a very small subarachnoid hemorrhage, which is suspected to be traumatic in nature as the patient is not his head is a component of the sequence of events involving his seizure, as opposed to representing a aneurysmal bleed leading to seizure.  No evidence of underlying infectious contribution.  No evidence of hypoglycemia.  EDP discussed patient's case with on-call neurology, Dr. Voncile, who recommends resumption of the patient's home Keppra  1 g p.o. twice daily, seizure precautions, and outpatient follow-up with neurology.  Additionally, Dr. Arora recommends that if patient has an additional seizure while in the hospital, that the neurology service be contacted to request formal consult at that time.  Of note, the patient has been loaded with 1.5 g of IV Keppra  in the emergency department this evening.  Plan: Resume home Keppra  1 g p.o. twice daily, as above.  As needed IV Ativan .  Seizure precautions ordered.  Every 4 hour neurochecks x 3 occurrences.  I have placed order for transition of care consult to assist the patient with affordability/availability of his outpatient Keppra .  Further evaluation management of presenting hypomagnesemia, as below.  Check urinary drug screen.  Follow-up result of serum Keppra  level.  Repeat CMP, CBC in the morning.                 #) Subarachnoid  hemorrhage: There was question via radiology read on CT head and MRI brain of potential very small subarachnoid hemorrhage without any corresponding cerebral edema, midline shift, or mass effect.  He is at increased risk for traumatic subarachnoid hemorrhage in the setting of hitting his head as a component of fall due to a seizure today.  No evidence of acute focal neurologic deficits at this time.  Not on a blood thinners as an outpatient.  No clinical evidence to suggest increased intracranial pressure, including no evidence of bradycardia or worsening hypertension.  EDP d/w on neurosurgery, Dr. Gillie, who reviewed the imaging  and did not appreciate the minuscule subdural hematoma, conveying that the initial radiology read of a small SAH may have been on the basis of artifact. Dr. Gillie did not feel that a repeat CT head or additional neurosurgical involvement were warranted at this time.    Plan: Monitor on telemetry.  Head of bed at 30 degrees.  Prn IV labetalol  for systolic blood pressure greater than 160.  Every 4 hours neurochecks.  Prn IV fentanyl  for residual headache.  Prn IV Zofran .  Add on PTT, INR.  Repeat CBC in the morning.                 #) Hypokalemia: presenting potassium level noted to be 3.4.  Concomitant hypomagnesemia is also noted, as further detailed below.  Plan: monitor on tele. KCl 40 meq p.o. x 1 dose.  Further evaluation management of presenting hypomagnesemia, as below.  CMP, mag level in the AM.                     #) Hypomagnesemia: presenting serum mag level noted to be 1.4, which is notable in the context of the patient's presenting tonic-clonic seizure as well as his finding of mild hypokalemia..    Plan: magnesium  sulfate 3 g IV over 2 hours. Monitor on tele. Repeat serum mag level in the AM.                        #) Type 2 Diabetes Mellitus: documented history of such. Home insulin  regimen: None. Home oral  hypoglycemic agents: Metformin . presenting blood sugar: 178. Most recent A1c noted to be 7.2% when checked in February 2024.  Plan: accuchecks QAC and HS with low dose SSI.  Hold home metformin  during this hospitalization.  Add on hemoglobin A1c level.                       #) Benign Prostatic Hyperplasia:  documented h/o such; on tamsulosin  as outpatient.   Plan: monitor strict I's & O's and daily weights. Repeat CMP in AM.        DVT prophylaxis: SCD's   Code Status: Full code Family Communication: none Disposition Plan: Per Rounding Team Consults called: EDP d/w on neurosurgery, Dr. Gillie, who reviewed the imaging and did not appreciate the minuscule subdural hematoma, conveying that the initial radiology read of a small subdural hematoma may have been on the basis of artifact. Dr. Gillie did not feel that a repeat CT head or additional neurosurgical involvement were warranted at this time.  Additionally, EDP discussed patient's case with on-call neurology, Dr. Voncile, who recommends resumption of the patient's home Keppra  1 g p.o. twice daily, seizure precautions, and outpatient follow-up with neurology.  Additionally, Dr. Arora recommends that if patient has an additional seizure while in the hospital, that the neurology service be contacted to request formal consult at that time.;  Admission status: Observation     I SPENT GREATER THAN 75  MINUTES IN CLINICAL CARE TIME/MEDICAL DECISION-MAKING IN COMPLETING THIS ADMISSION.      Eva NOVAK Lakesia Dahle DO [facility]  From 7PM - 7AM   06/06/2024, 8:04 PM

## 2024-06-06 NOTE — ED Notes (Signed)
RN removed c-collar

## 2024-06-06 NOTE — ED Notes (Signed)
 Provided pt with a urinal, something to eat and drink.

## 2024-06-06 NOTE — ED Provider Notes (Addendum)
 Care taken over from Dr. Dasie.  Patient has a history of seizures.  He has been out of his Keppra  for about 4 days due to insurance reasons.  He had his seizure today and fell.  CT scan showed an abnormal looking area but was inconclusive.  MRI shows what appears to be a small subarachnoid hemorrhage.  Felt to be traumatic rather than aneurysmal.  Discussed with Dr. Gillie.  He does not feel that there is anything emergent to do about it.  Patient has had a pretty bad headache and some vomiting here in the ED.  He was loaded with IV Keppra .  Will plan admission for observation.  Discussed with Dr. Voncile.  He advises to keep the patient on Keppra  1000 mg p.o. twice daily and if he has another seizure, neurology would be happy to consult.  Will discuss with the hospitalist for admission.  CRITICAL CARE Performed by: Andrea Ness Total critical care time: 45 minutes Critical care time was exclusive of separately billable procedures and treating other patients. Critical care was necessary to treat or prevent imminent or life-threatening deterioration. Critical care was time spent personally by me on the following activities: development of treatment plan with patient and/or surrogate as well as nursing, discussions with consultants, evaluation of patient's response to treatment, examination of patient, obtaining history from patient or surrogate, ordering and performing treatments and interventions, ordering and review of laboratory studies, ordering and review of radiographic studies, pulse oximetry and re-evaluation of patient's condition.    Ness Andrea, MD 06/06/24 RENARD Ness Andrea, MD 06/06/24 [phone]

## 2024-06-06 NOTE — Progress Notes (Signed)
 Patient ID: [name], male   DOB: [birth year], 68 y.o.   MRN: [id number] BP (!) 152/84   Pulse 92   Temp 97.9 F (36.6 C) (Oral)   Resp 18   Ht 5' 6 (1.676 m)   Wt 81.6 kg   SpO2 100%   BMI 29.05 kg/m  Films reviewed. I cannot identify the minuscule blood reported by radiology. There are no reasons for a repeat scan, nor any neurosurgical concerns. Basal cisterns widely patent, no ventricular effacement, nor midline shift. Should be evaluated for seizure, though report is he is non compliant with medications. 1

## 2024-06-06 NOTE — ED Triage Notes (Addendum)
 Pt here for witnessed seizure by family 30 min ago that lasted 1 min.Family states pt had one yesterday too. Pt post ictal on arrival. Pt fell and hit head during seizure. No blood thinners. VSS. Axox4 on arrival. Pt arrives with c-collar.

## 2024-06-06 NOTE — Plan of Care (Addendum)
 On-call note  Patient with history of seizures, not taking his Keppra  for the last 4 days due to some insurance issues comes in with breakthrough seizure.  Head CT with concern for a minuscule subdural hematoma which neurosurgery thinks might be artifactual.  He is being admitted because he is still nauseous. I would recommend resuming his home Keppra  dose of 1 g twice daily and continuing 1 g twice daily at discharge.  Maintain seizure precautions.  Follow-up with outpatient neurology. If he has another seizure in the hospital, please reengage neurology for a formal consultation as needed. This was discussed with Dr. Lenor Eligio Lav, MD Neurology

## 2024-06-07 ENCOUNTER — Other Ambulatory Visit (HOSPITAL_COMMUNITY): Payer: Self-pay

## 2024-06-07 DIAGNOSIS — G40909 Epilepsy, unspecified, not intractable, without status epilepticus: Secondary | ICD-10-CM | POA: Diagnosis not present

## 2024-06-07 DIAGNOSIS — I609 Nontraumatic subarachnoid hemorrhage, unspecified: Principal | ICD-10-CM

## 2024-06-07 DIAGNOSIS — E876 Hypokalemia: Secondary | ICD-10-CM | POA: Diagnosis not present

## 2024-06-07 DIAGNOSIS — R569 Unspecified convulsions: Secondary | ICD-10-CM | POA: Diagnosis not present

## 2024-06-07 LAB — CBC WITH DIFFERENTIAL/PLATELET
Abs Immature Granulocytes: 0.01 K/uL (ref 0.00–0.07)
Basophils Absolute: 0 K/uL (ref 0.0–0.1)
Basophils Relative: 0 %
Eosinophils Absolute: 0 K/uL (ref 0.0–0.5)
Eosinophils Relative: 1 %
HCT: 35.6 % — ABNORMAL LOW (ref 39.0–52.0)
Hemoglobin: 11.3 g/dL — ABNORMAL LOW (ref 13.0–17.0)
Immature Granulocytes: 0 %
Lymphocytes Relative: 23 %
Lymphs Abs: 1.3 K/uL (ref 0.7–4.0)
MCH: 27.9 pg (ref 26.0–34.0)
MCHC: 31.7 g/dL (ref 30.0–36.0)
MCV: 87.9 fL (ref 80.0–100.0)
Monocytes Absolute: 0.6 K/uL (ref 0.1–1.0)
Monocytes Relative: 10 %
Neutro Abs: 3.6 K/uL (ref 1.7–7.7)
Neutrophils Relative %: 66 %
Platelets: 158 K/uL (ref 150–400)
RBC: 4.05 MIL/uL — ABNORMAL LOW (ref 4.22–5.81)
RDW: 13.6 % (ref 11.5–15.5)
WBC: 5.5 K/uL (ref 4.0–10.5)
nRBC: 0 % (ref 0.0–0.2)

## 2024-06-07 LAB — MRSA NEXT GEN BY PCR, NASAL: MRSA by PCR Next Gen: NOT DETECTED

## 2024-06-07 LAB — RAPID URINE DRUG SCREEN, HOSP PERFORMED
Amphetamines: NOT DETECTED
Barbiturates: NOT DETECTED
Benzodiazepines: NOT DETECTED
Cocaine: NOT DETECTED
Opiates: NOT DETECTED
Tetrahydrocannabinol: NOT DETECTED

## 2024-06-07 LAB — RENAL FUNCTION PANEL
Albumin: 2.7 g/dL — ABNORMAL LOW (ref 3.5–5.0)
Anion gap: 7 (ref 5–15)
BUN: 8 mg/dL (ref 8–23)
CO2: 25 mmol/L (ref 22–32)
Calcium: 8.6 mg/dL — ABNORMAL LOW (ref 8.9–10.3)
Chloride: 107 mmol/L (ref 98–111)
Creatinine, Ser: 1.06 mg/dL (ref 0.61–1.24)
GFR, Estimated: >60 mL/min (ref >60)
Glucose, Bld: 158 mg/dL — ABNORMAL HIGH (ref 70–99)
Phosphorus: 2.9 mg/dL (ref 2.5–4.6)
Potassium: 3.7 mmol/L (ref 3.5–5.1)
Sodium: 139 mmol/L (ref 135–145)

## 2024-06-07 LAB — COMPREHENSIVE METABOLIC PANEL WITH GFR
ALT: 13 U/L (ref 0–44)
AST: 17 U/L (ref 15–41)
Albumin: 2.4 g/dL — ABNORMAL LOW (ref 3.5–5.0)
Alkaline Phosphatase: 42 U/L (ref 38–126)
Anion gap: 9 (ref 5–15)
BUN: 7 mg/dL — ABNORMAL LOW (ref 8–23)
CO2: 25 mmol/L (ref 22–32)
Calcium: 8.4 mg/dL — ABNORMAL LOW (ref 8.9–10.3)
Chloride: 107 mmol/L (ref 98–111)
Creatinine, Ser: 0.99 mg/dL (ref 0.61–1.24)
GFR, Estimated: >60 mL/min (ref >60)
Glucose, Bld: 220 mg/dL — ABNORMAL HIGH (ref 70–99)
Potassium: 3.5 mmol/L (ref 3.5–5.1)
Sodium: 141 mmol/L (ref 135–145)
Total Bilirubin: 0.3 mg/dL (ref 0.0–1.2)
Total Protein: 5.3 g/dL — ABNORMAL LOW (ref 6.5–8.1)

## 2024-06-07 LAB — PROTEIN / CREATININE RATIO, URINE
Creatinine, Urine: 178 mg/dL
Protein Creatinine Ratio: 0.11 mg/mg{creat} (ref 0.00–0.15)
Total Protein, Urine: 19 mg/dL

## 2024-06-07 LAB — MAGNESIUM
Magnesium: 1.4 mg/dL — ABNORMAL LOW (ref 1.7–2.4)
Magnesium: 1.6 mg/dL — ABNORMAL LOW (ref 1.7–2.4)

## 2024-06-07 LAB — GLUCOSE, CAPILLARY
Glucose-Capillary: 220 mg/dL — ABNORMAL HIGH (ref 70–99)
Glucose-Capillary: 124 mg/dL — ABNORMAL HIGH (ref 70–99)

## 2024-06-07 LAB — APTT: aPTT: 31 s (ref 24–36)

## 2024-06-07 LAB — PROTIME-INR
INR: 1.1 (ref 0.8–1.2)
Prothrombin Time: 14.4 s (ref 11.4–15.2)

## 2024-06-07 MED ORDER — MAGNESIUM OXIDE -MG SUPPLEMENT 400 (240 MG) MG PO TABS
400.0000 mg | ORAL_TABLET | Freq: Every day | ORAL | 0 refills | Status: AC
  Filled 2024-06-07: qty 7, 7d supply, fill #0

## 2024-06-07 MED ORDER — METFORMIN HCL 500 MG PO TABS
500.0000 mg | ORAL_TABLET | Freq: Two times a day (BID) | ORAL | 2 refills | Status: AC
  Filled 2024-06-07: qty 60, 30d supply, fill #0

## 2024-06-07 MED ORDER — TAMSULOSIN HCL 0.4 MG PO CAPS: 0.4000 mg | ORAL_CAPSULE | Freq: Every day | ORAL | 2 refills | Status: AC

## 2024-06-07 MED ORDER — TAMSULOSIN HCL 0.4 MG PO CAPS
0.4000 mg | ORAL_CAPSULE | Freq: Every day | ORAL | Status: DC
Start: 2024-06-07 — End: 2024-06-07
  Administered 2024-06-07: 0.4 mg via ORAL
  Filled 2024-06-07: qty 1

## 2024-06-07 MED ORDER — MAGNESIUM SULFATE 50 % IJ SOLN
3.0000 g | Freq: Once | INTRAVENOUS | Status: DC
  Filled 2024-06-07: qty 6

## 2024-06-07 MED ORDER — POTASSIUM CHLORIDE 20 MEQ PO PACK
40.0000 meq | PACK | Freq: Once | ORAL | Status: AC
  Administered 2024-06-07: 40 meq via ORAL
  Filled 2024-06-07: qty 2

## 2024-06-07 MED ORDER — LEVETIRACETAM 1000 MG PO TABS
1000.0000 mg | ORAL_TABLET | Freq: Two times a day (BID) | ORAL | 1 refills | Status: DC
  Filled 2024-06-07: qty 60, 30d supply, fill #0

## 2024-06-07 MED ORDER — MAGNESIUM SULFATE 4 GM/100ML IV SOLN: 4.0000 g | Freq: Once | INTRAVENOUS | Status: DC

## 2024-06-07 NOTE — Discharge Summary (Addendum)
 Physician Discharge Summary  Patient ID: [name] MRN: [id number] DOB/AGE: 21-Oct-1956 68 y.o.  Admit date: 06/06/2024 Discharge date: 06/07/2024  Admission Diagnoses:  Discharge Diagnoses:  Principal Problem:   Seizure [facility]) Active Problems:   Hypokalemia   Hypomagnesemia   DM2 (diabetes mellitus, type 2) (HCC)   BPH (benign prostatic hyperplasia)   SAH (subarachnoid hemorrhage) (HCC)   Discharged Condition: stable  Hospital Course:  Patient is a 68 year old male with history seizure, but recently non-compliant with Keppra  due to financial challenge.  Patient was reported not to have taken his anti-seizure medication for 4 days.  Patient was admitted with break through seizure.  Patient was admitted and Keppra  restarted.  Neurosurgery was consulted due to concerns for possible subdural hemorrhage.  Neurosurgery team directed care of possible subdural hemorrhage (please see Neurosurgery team documentation).  Patient has remained seizure free.  Patient will follow up with PCP and Neurology team on discharge.  Transition of care team will assist with patient's discharge.  Possible subdural hemorrhage: -As per Neurosurgery -No further work up as per neurosurgery team.  Current information reveals the neurosurgery team, on reviewing the images, may not have appreciated subdural hemorrhage.  Hypomagnesemia: - Magnesium  of 1.4. - IV magnesium  3 g given during the hospital stay. - Discharge patient on oral magnesium . - Last magnesium  level prior to discharge was 1.6.  Breakthrough seizure: - See above documentation.  Consults:  Admitting provider discussed case with neurology team. Neurosurgery team  Significant Diagnostic Studies:  - Magnesium  of 1.4.  MRI brain with and without contrast revealed: 1. Small-volume acute subarachnoid hemorrhage scattered along the bilateral cerebral convexities, and within the right sylvian fissure. 2. Background parenchymal atrophy and  chronic small vessel ischemic disease, stable as compared to the MRI of 12/20/2022. 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 3. Mild generalized cerebral volume loss. 4. Right parietooccipital scalp hematoma.    CT CERVICAL SPINE WITHOUT CONTRAST   TECHNIQUE: Multidetector CT imaging of the cervical spine was performed without intravenous contrast. Multiplanar CT image reconstructions were also generated.   RADIATION DOSE REDUCTION: This exam was performed according to the departmental dose-optimization program which includes automated exposure control, adjustment of the mA and/or kV according to patient size and/or use of iterative reconstruction technique.   COMPARISON:  None Available.   FINDINGS: Alignment: Normal.   Skull base and vertebrae: No acute fracture. No primary bone lesion or focal pathologic process.   Soft tissues and spinal canal: No prevertebral fluid or swelling. No visible canal hematoma.   Disc levels: Moderate chronic degenerative disc disease at C4-5, C5-6 and C6-7, with mild-to-moderate central spinal canal stenosis at each level. There is also mild chronic degenerative disc disease at C3-4, with mild central spinal canal stenosis.   Upper chest: The lung apices are clear.   Other: None.   IMPRESSION: Multilevel chronic degenerative disc disease. No evidence of acute traumatic injury.     Electronically Signed   By: Evalene Coho M.D.   On: 06/06/2024 13:49   CT Head without contrast revealed: 1. There is a new area of increased density seen along the junction of the right parietal and temporal lobes, which is concerning for focal cortical/subcortical hemorrhage.  Treatments: Keppra  re-started.  Discharge Exam: Blood pressure (!) 143/83, pulse 79, temperature 98 F (36.7 C), temperature source Oral, resp. rate 20, height 5' 6 (1.676 m), weight 82.1 kg, SpO2 99%.   Disposition: Discharge  disposition: 01-Home or Self Care  Discharge Instructions     Diet - low sodium heart healthy   Complete by: As directed    Increase activity slowly   Complete by: As directed       Allergies as of 06/07/2024       Reactions   Nsaids Other (See Comments)   Crohn's disease, told to avoid NSAIDs        Medication List     STOP taking these medications    atorvastatin  40 MG tablet Commonly known as: LIPITOR   Humira (2 Pen) 40 MG/0.4ML pen Generic drug: adalimumab   pantoprazole  40 MG tablet Commonly known as: Protonix    Valtoco  10 MG Dose 10 MG/0.1ML Liqd Generic drug: diazePAM    VITAMIN B-12 PO   VITAMIN D -3 PO       TAKE these medications    acetaminophen  325 MG tablet Commonly known as: TYLENOL  Take 2 tablets (650 mg total) by mouth every 6 (six) hours as needed for mild pain, moderate pain or headache.   levETIRAcetam  1000 MG tablet Commonly known as: KEPPRA  Take 1 tablet (1,000 mg total) by mouth 2 (two) times daily.   Mag-Oxide 200 MG Tabs Generic drug: Magnesium  Oxide -Mg Supplement Take 2 tablets (400 mg total) by mouth daily for 7 days.   metFORMIN  500 MG tablet Commonly known as: GLUCOPHAGE  Take 1 tablet (500 mg total) by mouth 2 (two) times daily.   tamsulosin  0.4 MG Caps capsule Commonly known as: FLOMAX  Take 1 capsule (0.4 mg total) by mouth daily after supper.       Time spent: 35 Minutes.  SignedBETHA Leatrice LILLETTE Rosario 06/07/2024, 2:44 PM

## 2024-06-07 NOTE — Progress Notes (Signed)
 AVS and discharge education given.  Received paper prescription and TOC meds at bedside.   Patient given time to ask questions.  Verbally understands discharge plan of care.   No PIV present during discharge.  Transferred to Lounge.  D/C lounge notified.  Wife is OTW.

## 2024-06-07 NOTE — Care Management Obs Status (Signed)
 MEDICARE OBSERVATION STATUS NOTIFICATION   Patient Details  Name: [name] MRN: [id number] Date of Birth: 09/20/1956   Medicare Observation Status Notification Given:  Yes    Carletha Spruce, RN 06/07/2024, 2:27 PM

## 2024-06-08 LAB — LEVETIRACETAM LEVEL: Levetiracetam Lvl: <2 ug/mL — ABNORMAL LOW (ref 10.0–40.0)

## 2024-06-10 ENCOUNTER — Encounter (HOSPITAL_COMMUNITY): Payer: Self-pay

## 2024-06-10 ENCOUNTER — Emergency Department (HOSPITAL_COMMUNITY)

## 2024-06-10 ENCOUNTER — Telehealth: Payer: Self-pay | Admitting: Neurology

## 2024-06-10 ENCOUNTER — Inpatient Hospital Stay (HOSPITAL_COMMUNITY)
Admission: EM | Admit: 2024-06-10 | Discharge: 2024-06-12 | DRG: 101 | Disposition: A | Discharge status: Home / Self Care | Attending: Internal Medicine | Admitting: Internal Medicine

## 2024-06-10 DIAGNOSIS — K5090 Crohn's disease, unspecified, without complications: Secondary | ICD-10-CM | POA: Diagnosis present

## 2024-06-10 DIAGNOSIS — G40909 Epilepsy, unspecified, not intractable, without status epilepticus: Principal | ICD-10-CM | POA: Diagnosis present

## 2024-06-10 DIAGNOSIS — R4182 Altered mental status, unspecified: Principal | ICD-10-CM

## 2024-06-10 DIAGNOSIS — Z79899 Other long term (current) drug therapy: Secondary | ICD-10-CM | POA: Diagnosis not present

## 2024-06-10 DIAGNOSIS — Z886 Allergy status to analgesic agent status: Secondary | ICD-10-CM

## 2024-06-10 DIAGNOSIS — Z87891 Personal history of nicotine dependence: Secondary | ICD-10-CM

## 2024-06-10 DIAGNOSIS — E876 Hypokalemia: Secondary | ICD-10-CM | POA: Diagnosis present

## 2024-06-10 DIAGNOSIS — Z820 Family history of epilepsy and other diseases of the nervous system: Secondary | ICD-10-CM | POA: Diagnosis not present

## 2024-06-10 DIAGNOSIS — Z96642 Presence of left artificial hip joint: Secondary | ICD-10-CM | POA: Diagnosis present

## 2024-06-10 DIAGNOSIS — R410 Disorientation, unspecified: Secondary | ICD-10-CM | POA: Diagnosis not present

## 2024-06-10 DIAGNOSIS — Z8572 Personal history of non-Hodgkin lymphomas: Secondary | ICD-10-CM | POA: Diagnosis not present

## 2024-06-10 DIAGNOSIS — Z809 Family history of malignant neoplasm, unspecified: Secondary | ICD-10-CM | POA: Diagnosis not present

## 2024-06-10 DIAGNOSIS — Z91141 Patient's other noncompliance with medication regimen due to financial hardship: Secondary | ICD-10-CM

## 2024-06-10 DIAGNOSIS — E119 Type 2 diabetes mellitus without complications: Secondary | ICD-10-CM | POA: Diagnosis present

## 2024-06-10 DIAGNOSIS — R569 Unspecified convulsions: Secondary | ICD-10-CM | POA: Diagnosis not present

## 2024-06-10 DIAGNOSIS — Z7984 Long term (current) use of oral hypoglycemic drugs: Secondary | ICD-10-CM | POA: Diagnosis not present

## 2024-06-10 LAB — CBC WITH DIFFERENTIAL/PLATELET
Abs Immature Granulocytes: 0.03 K/uL (ref 0.00–0.07)
Basophils Absolute: 0 K/uL (ref 0.0–0.1)
Basophils Relative: 0 %
Eosinophils Absolute: 0 K/uL (ref 0.0–0.5)
Eosinophils Relative: 1 %
HCT: 39.1 % (ref 39.0–52.0)
Hemoglobin: 12.5 g/dL — ABNORMAL LOW (ref 13.0–17.0)
Immature Granulocytes: 1 %
Lymphocytes Relative: 24 %
Lymphs Abs: 1.4 K/uL (ref 0.7–4.0)
MCH: 28.5 pg (ref 26.0–34.0)
MCHC: 32 g/dL (ref 30.0–36.0)
MCV: 89.1 fL (ref 80.0–100.0)
Monocytes Absolute: 0.5 K/uL (ref 0.1–1.0)
Monocytes Relative: 9 %
Neutro Abs: 4 K/uL (ref 1.7–7.7)
Neutrophils Relative %: 65 %
Platelets: 182 K/uL (ref 150–400)
RBC: 4.39 MIL/uL (ref 4.22–5.81)
RDW: 13.4 % (ref 11.5–15.5)
WBC: 6 K/uL (ref 4.0–10.5)
nRBC: 0 % (ref 0.0–0.2)

## 2024-06-10 LAB — COMPREHENSIVE METABOLIC PANEL WITH GFR
ALT: 10 U/L (ref 0–44)
AST: 15 U/L (ref 15–41)
Albumin: 2.5 g/dL — ABNORMAL LOW (ref 3.5–5.0)
Alkaline Phosphatase: 44 U/L (ref 38–126)
Anion gap: 5 (ref 5–15)
BUN: 12 mg/dL (ref 8–23)
CO2: 24 mmol/L (ref 22–32)
Calcium: 8.1 mg/dL — ABNORMAL LOW (ref 8.9–10.3)
Chloride: 110 mmol/L (ref 98–111)
Creatinine, Ser: 0.86 mg/dL (ref 0.61–1.24)
GFR, Estimated: >60 mL/min (ref >60)
Glucose, Bld: 172 mg/dL — ABNORMAL HIGH (ref 70–99)
Potassium: 3.3 mmol/L — ABNORMAL LOW (ref 3.5–5.1)
Sodium: 139 mmol/L (ref 135–145)
Total Bilirubin: <0.2 mg/dL (ref 0.0–1.2)
Total Protein: 5.5 g/dL — ABNORMAL LOW (ref 6.5–8.1)

## 2024-06-10 LAB — MAGNESIUM: Magnesium: 1.6 mg/dL — ABNORMAL LOW (ref 1.7–2.4)

## 2024-06-10 LAB — RAPID URINE DRUG SCREEN, HOSP PERFORMED
Amphetamines: NOT DETECTED
Barbiturates: NOT DETECTED
Benzodiazepines: NOT DETECTED
Cocaine: NOT DETECTED
Opiates: NOT DETECTED
Tetrahydrocannabinol: NOT DETECTED

## 2024-06-10 LAB — URINALYSIS, ROUTINE W REFLEX MICROSCOPIC
Bacteria, UA: NONE SEEN
Bilirubin Urine: NEGATIVE
Glucose, UA: 50 mg/dL — AB
Hgb urine dipstick: NEGATIVE
Ketones, ur: NEGATIVE mg/dL
Leukocytes,Ua: NEGATIVE
Nitrite: NEGATIVE
Protein, ur: NEGATIVE mg/dL
Specific Gravity, Urine: 1.021 (ref 1.005–1.030)
pH: 5 (ref 5.0–8.0)

## 2024-06-10 LAB — ETHANOL: Alcohol, Ethyl (B): <15 mg/dL (ref <15)

## 2024-06-10 MED ORDER — MAGNESIUM SULFATE 2 GM/50ML IV SOLN: 2.0000 g | Freq: Once | INTRAVENOUS | Status: DC

## 2024-06-10 MED ORDER — POTASSIUM CHLORIDE CRYS ER 20 MEQ PO TBCR
40.0000 meq | EXTENDED_RELEASE_TABLET | Freq: Once | ORAL | Status: AC
  Administered 2024-06-10: 40 meq via ORAL
  Filled 2024-06-10: qty 2

## 2024-06-10 MED ORDER — MAGNESIUM OXIDE -MG SUPPLEMENT 400 (240 MG) MG PO TABS
400.0000 mg | ORAL_TABLET | Freq: Once | ORAL | Status: AC
  Administered 2024-06-10: 400 mg via ORAL
  Filled 2024-06-10: qty 1

## 2024-06-10 NOTE — ED Triage Notes (Signed)
 Clemens and hit his head on Thurs. Confirmed brain bleed on Thurs.   Altered mental status, LNW Thursday prior to fall. L side droop and mild aphasia.    170sBP 230 CBG 75HR, 98% RA, 18 L AC

## 2024-06-10 NOTE — Telephone Encounter (Signed)
 Attempted to call Pt wife. No answer, LVM that Pt should be urgently evaluated in ED and if further questions to call back.

## 2024-06-10 NOTE — ED Notes (Signed)
 Patient transported to CT

## 2024-06-10 NOTE — ED Provider Notes (Signed)
 [place] EMERGENCY DEPARTMENT AT [facility] Provider Note   CSN: [id number] Arrival date & time: 06/10/24  8146     Patient presents with: Altered Mental Status   [name] is a 68 y.o. male.  Patient presents to the emergency department with concerns of altered mental status.  Past history significant for type 2 diabetes, recent subarachnoid hemorrhage, seizure disorder.  Family called EMS with concerns that patient has had increasing feelings of confusion since he has been discharged on Saturday, 3 days prior.  No recurrent falls or seizure activity.  Last known normal was around the time of his fall but family reports that he has intermittently had some left-sided facial droop and some mild aphasia.  Patient endorses some slight dizziness/room spinning sensation.  Not currently on blood thinners.  Reports has been taking seizure medications prescribed.   Altered Mental Status Presenting symptoms: confusion        Prior to Admission medications   Medication Sig Start Date End Date Taking? Authorizing Provider  acetaminophen  (TYLENOL ) 325 MG tablet Take 2 tablets (650 mg total) by mouth every 6 (six) hours as needed for mild pain, moderate pain or headache. 12/28/22   [place], Camellia JINNY, DO  levETIRAcetam  (KEPPRA ) 1000 MG tablet Take 1 tablet (1,000 mg total) by mouth 2 (two) times daily. 06/07/24   Rosario Leatrice FERNS, MD  magnesium  oxide (MAG-OX) 400 (240 Mg) MG tablet Take 1 tablet (400 mg total) by mouth daily for 7 days. 06/07/24 06/14/24  Rosario Leatrice I, MD  metFORMIN  (GLUCOPHAGE ) 500 MG tablet Take 1 tablet (500 mg total) by mouth 2 (two) times daily. 06/07/24 09/05/24  Rosario Leatrice FERNS, MD  tamsulosin  (FLOMAX ) 0.4 MG CAPS capsule Take 1 capsule (0.4 mg total) by mouth daily after supper. 06/07/24   Rosario Leatrice FERNS, MD    Allergies: Nsaids    Review of Systems  Psychiatric/Behavioral:  Positive for confusion.   All other systems reviewed and are  negative.   Updated Vital Signs BP (!) 155/96   Pulse 74   Resp 18   Ht 5' 6 (1.676 m)   Wt 82 kg   SpO2 100%   BMI 29.18 kg/m   Physical Exam Vitals and nursing note reviewed.  Constitutional:      General: He is not in acute distress.    Appearance: He is well-developed.  HENT:     Head: Normocephalic and atraumatic.  Eyes:     Conjunctiva/sclera: Conjunctivae normal.  Cardiovascular:     Rate and Rhythm: Normal rate and regular rhythm.     Heart sounds: No murmur heard. Pulmonary:     Effort: Pulmonary effort is normal. No respiratory distress.     Breath sounds: Normal breath sounds.  Abdominal:     Palpations: Abdomen is soft.     Tenderness: There is no abdominal tenderness.  Musculoskeletal:        General: No swelling.     Cervical back: Neck supple.  Skin:    General: Skin is warm and dry.     Capillary Refill: Capillary refill takes less than 2 seconds.  Neurological:     General: No focal deficit present.     Mental Status: He is alert and oriented to person, place, and time. Mental status is at baseline.     Cranial Nerves: No cranial nerve deficit.     Motor: No weakness.     Comments: CN II-XII intact. No pronator drift. Symmetric strength and sensation  in bilateral upper and lower extremities. No slurred speech or facial droop.  Psychiatric:        Mood and Affect: Mood normal.     (all labs ordered are listed, but only abnormal results are displayed) Labs Reviewed  CBC WITH DIFFERENTIAL/PLATELET - Abnormal; Notable for the following components:      Result Value   Hemoglobin 12.5 (*)    All other components within normal limits  COMPREHENSIVE METABOLIC PANEL WITH GFR - Abnormal; Notable for the following components:   Potassium 3.3 (*)    Glucose, Bld 172 (*)    Calcium  8.1 (*)    Total Protein 5.5 (*)    Albumin  2.5 (*)    All other components within normal limits  URINALYSIS, ROUTINE W REFLEX MICROSCOPIC - Abnormal; Notable for the  following components:   APPearance HAZY (*)    Glucose, UA 50 (*)    All other components within normal limits  MAGNESIUM  - Abnormal; Notable for the following components:   Magnesium  1.6 (*)    All other components within normal limits  ETHANOL  RAPID URINE DRUG SCREEN, HOSP PERFORMED    EKG: EKG Interpretation Date/Time:  Tuesday June 10 2024 19:38:33 EDT Ventricular Rate:  81 PR Interval:  141 QRS Duration:  110 QT Interval:  332 QTC Calculation: 386 R Axis:   19  Text Interpretation: Sinus rhythm Probable left atrial enlargement Low voltage, precordial leads Confirmed by Neysa Clap [phone]) on 06/10/2024 10:19:31 PM  Radiology: CT Head Wo Contrast Result Date: 06/10/2024 CLINICAL DATA:  Altered mental status EXAM: CT HEAD WITHOUT CONTRAST TECHNIQUE: Contiguous axial images were obtained from the base of the skull through the vertex without intravenous contrast. RADIATION DOSE REDUCTION: This exam was performed according to the departmental dose-optimization program which includes automated exposure control, adjustment of the mA and/or kV according to patient size and/or use of iterative reconstruction technique. COMPARISON:  12/08/2023 FINDINGS: Brain: No evidence of acute infarction, hemorrhage, hydrocephalus, extra-axial collection or mass lesion/mass effect. Previously seen area of increased attenuation on the right shown to represent subarachnoid hemorrhage has resolved. No new hemorrhage is seen. Small lacunar infarct is noted posteriorly in the right thalamus. Vascular: No hyperdense vessel or unexpected calcification. Skull: Normal. Negative for fracture or focal lesion. Sinuses/Orbits: No acute finding. Other: None. IMPRESSION: Previously seen subarachnoid hemorrhage on the right has resolved in the interval. No new focal abnormality is seen. Electronically Signed   By: Oneil Devonshire M.D.   On: 06/10/2024 19:40   DG Chest Portable 1 View Result Date: 06/10/2024 CLINICAL DATA:   Altered mental status EXAM: PORTABLE CHEST 1 VIEW COMPARISON:  Chest x-ray 12/24/2022 FINDINGS: The heart size and mediastinal contours are within normal limits. Both lungs are clear. The visualized skeletal structures are unremarkable. IMPRESSION: No active disease. Electronically Signed   By: Greig Pique M.D.   On: 06/10/2024 19:23     Procedures   Medications Ordered in the ED  potassium chloride  SA (KLOR-CON  M) CR tablet 40 mEq (40 mEq Oral Given 06/10/24 2139)  magnesium  oxide (MAG-OX) tablet 400 mg (400 mg Oral Given 06/10/24 2139)                                    Medical Decision Making Amount and/or Complexity of Data Reviewed Labs: ordered. Radiology: ordered.  Risk OTC drugs. Prescription drug management.   This patient presents to the ED  for concern of AMS, this involves an extensive number of treatment options, and is a complaint that carries with it a high risk of complications and morbidity.  The differential diagnosis includes AMS, encephalopathy, UTI, acute stroke   Co morbidities that complicate the patient evaluation  Type 2 DM, AKI, seizures   Lab Tests:  I Ordered, and personally interpreted labs.  The pertinent results include:  CBC with hemoglobin at 12.5, CMP largely unremarkable, magnesium  at 1.6, UA unremarkable, UDS negative, ethanol negative   Imaging Studies ordered:  I ordered imaging studies including CT head, chest xray I independently visualized and interpreted imaging which showed previously seen subarachnoid hemorrhage on the right has resolved in the interval. No new focal abnormality is seen. Normal chest xray. I agree with the radiologist interpretation   Cardiac Monitoring: / EKG:  The patient was maintained on a cardiac monitor.  I personally viewed and interpreted the cardiac monitored which showed an underlying rhythm of: sinus rhythm   Consultations Obtained:  I requested consultation with the hospitalist,  and discussed  lab and imaging findings as well as pertinent plan - they recommend: Spoke with Dr. Debby who will be admitting patient.   Problem List / ED Course / Critical interventions / Medication management  Patient presents to the ED with concerns of AMS. He is 3 days post-discharge and 5 days post-admission for seizure and subarachnoid hemorrhage from head trauma. Family called EMS concerned for AMS with episodes of increasing confusion, slurred speech, and facial droop since being discharged. Has continued taking Keppra  since discharge. Denies any obvious seizure activity. Wife at bedside reports that he has not returned to baseline yet and appears to be persistently confused. On exam, he is awake and interacting appropriately, neurologic exam is largely reassuring with no obvious deficits or motor or sensory testing. He can follow commands appropriately, but is confused to location at this time. Will proceed with labs and repeat head CT to assess for possible stroke vs worsening SAH. Labs show slight hypokalemia and hypomagnesemia. Otherwise unremarkable. No UTI, UDS negative, and ethanol negative. CT head negative for acute changes and shows improvement/resolution of SAH from a few days prior. On reassessment, patient has developed some increased confusion and continues to state I don't know what I'm supposed to be doing unrelated to questions I was asking. This may be post-concussive symptoms. Family is concerned having him return home in this state. Will speak with neurology and hospitalist regarding patient. Spoke with Dr. Michaela, neurology, who does not feel that this likely requires MR imaging or has concerns for stroke given normal neurologic exam although did advise the MR imaging may be needed if he has recurrence in the ER. Spoke with Dr. Debby, hospitalist, who will be admitting patient. I ordered medication including magnesium , potassium  for hypomagnesemia, hypokalemia  Reevaluation of  the patient after these medicines showed that the patient improved I have reviewed the patients home medicines and have made adjustments as needed   Social Determinants of Health:  Recent SAH   Test / Admission - Considered:  Admitting to hospital  Final diagnoses:  Altered mental status, unspecified altered mental status type  Hypokalemia  Hypomagnesemia    ED Discharge Orders     None          Cecily Legrand LABOR, PA-C 06/10/24 2354    Neysa Caron PARAS, DO 06/13/24 1502

## 2024-06-10 NOTE — Telephone Encounter (Signed)
 Pt wife called to request for Pt to be seen today . Wife stated that Pt had went to ER  on Friday due to seizure.  Hospital release Pt Saturday , To day at 12:35 Pt wife called  office stating that Pt is on couch and was unresponsive for  a little bit but came to . Pt  is still zoning in and out . Wife will be taking Pt to ER .  Wife was not sure what to do  and wanted to  check with MD first . However Wife is taking Pt to ER

## 2024-06-11 ENCOUNTER — Inpatient Hospital Stay (HOSPITAL_COMMUNITY)

## 2024-06-11 DIAGNOSIS — R410 Disorientation, unspecified: Secondary | ICD-10-CM | POA: Diagnosis not present

## 2024-06-11 DIAGNOSIS — R569 Unspecified convulsions: Secondary | ICD-10-CM | POA: Diagnosis not present

## 2024-06-11 LAB — MAGNESIUM: Magnesium: 2.2 mg/dL (ref 1.7–2.4)

## 2024-06-11 LAB — BASIC METABOLIC PANEL WITH GFR
Anion gap: 9 (ref 5–15)
BUN: 9 mg/dL (ref 8–23)
CO2: 24 mmol/L (ref 22–32)
Calcium: 8.5 mg/dL — ABNORMAL LOW (ref 8.9–10.3)
Chloride: 106 mmol/L (ref 98–111)
Creatinine, Ser: 0.94 mg/dL (ref 0.61–1.24)
GFR, Estimated: >60 mL/min (ref >60)
Glucose, Bld: 187 mg/dL — ABNORMAL HIGH (ref 70–99)
Potassium: 3.7 mmol/L (ref 3.5–5.1)
Sodium: 139 mmol/L (ref 135–145)

## 2024-06-11 LAB — CBG MONITORING, ED: Glucose-Capillary: 129 mg/dL — ABNORMAL HIGH (ref 70–99)

## 2024-06-11 LAB — GLUCOSE, CAPILLARY
Glucose-Capillary: 213 mg/dL — ABNORMAL HIGH (ref 70–99)
Glucose-Capillary: 195 mg/dL — ABNORMAL HIGH (ref 70–99)

## 2024-06-11 LAB — HIV ANTIBODY (ROUTINE TESTING W REFLEX): HIV Screen 4th Generation wRfx: NONREACTIVE

## 2024-06-11 LAB — PHOSPHORUS: Phosphorus: 2 mg/dL — ABNORMAL LOW (ref 2.5–4.6)

## 2024-06-11 LAB — TSH: TSH: 1.233 u[IU]/mL (ref 0.350–4.500)

## 2024-06-11 MED ORDER — ORAL CARE MOUTH RINSE: 15.0000 mL | OROMUCOSAL | Status: DC | PRN

## 2024-06-11 MED ORDER — MAGNESIUM SULFATE 2 GM/50ML IV SOLN
2.0000 g | Freq: Once | INTRAVENOUS | Status: AC
  Administered 2024-06-11: 2 g via INTRAVENOUS
  Filled 2024-06-11: qty 50

## 2024-06-11 MED ORDER — LEVETIRACETAM 500 MG PO TABS
1000.0000 mg | ORAL_TABLET | Freq: Two times a day (BID) | ORAL | Status: DC
  Administered 2024-06-11 – 2024-06-12 (×3): 1000 mg via ORAL
  Filled 2024-06-11 (×3): qty 2

## 2024-06-11 MED ORDER — ACETAMINOPHEN 500 MG PO TABS
1000.0000 mg | ORAL_TABLET | Freq: Four times a day (QID) | ORAL | Status: AC | PRN
  Administered 2024-06-11 – 2024-06-12 (×2): 1000 mg via ORAL
  Filled 2024-06-11 (×2): qty 2

## 2024-06-11 MED ORDER — INSULIN ASPART 100 UNIT/ML IJ SOLN
0.0000 [IU] | Freq: Three times a day (TID) | INTRAMUSCULAR | Status: DC
  Administered 2024-06-11: 2 [IU] via SUBCUTANEOUS
  Administered 2024-06-11: 1 [IU] via SUBCUTANEOUS

## 2024-06-11 MED ORDER — MAGNESIUM OXIDE -MG SUPPLEMENT 400 (240 MG) MG PO TABS
400.0000 mg | ORAL_TABLET | Freq: Every day | ORAL | Status: DC
  Administered 2024-06-11 – 2024-06-12 (×2): 400 mg via ORAL
  Filled 2024-06-11 (×2): qty 1

## 2024-06-11 MED ORDER — POTASSIUM CHLORIDE 10 MEQ/100ML IV SOLN
10.0000 meq | INTRAVENOUS | Status: DC
  Administered 2024-06-11 (×3): 10 meq via INTRAVENOUS
  Filled 2024-06-11 (×3): qty 100

## 2024-06-11 MED ORDER — ALBUTEROL SULFATE (2.5 MG/3ML) 0.083% IN NEBU: 2.5000 mg | INHALATION_SOLUTION | RESPIRATORY_TRACT | Status: DC | PRN

## 2024-06-11 MED ORDER — METFORMIN HCL 500 MG PO TABS
500.0000 mg | ORAL_TABLET | Freq: Two times a day (BID) | ORAL | Status: DC
Start: 2024-06-11 — End: 2024-06-12
  Administered 2024-06-11 – 2024-06-12 (×3): 500 mg via ORAL
  Filled 2024-06-11 (×3): qty 1

## 2024-06-11 NOTE — Procedures (Signed)
 Patient Name: [name]  MRN: [id number]  Epilepsy Attending: Pastor Falling  Referring Physician/Provider: No ref. provider found      Date: 06/11/2024 Duration: 23 minutes   Patient history: 68 year old man with altered mental status and increase confusion   Level of alertness: Awake  AEDs during EEG study: LEV  Technical aspects: This EEG study was done with scalp electrodes positioned according to the 10-20 International system of electrode placement. Electrical activity was reviewed with band pass filter of 1-70Hz , sensitivity of 7 uV/mm, display speed of 84mm/sec with a 60Hz  notched filter applied as appropriate. EEG data were recorded continuously and digitally stored.  Video monitoring was available and reviewed as appropriate.  Description: The posterior dominant rhythm consists of 9 Hz activity of moderate voltage (25-35 uV) seen predominantly in posterior head regions, symmetric and reactive to eye opening and eye closing. Drowsiness was characterized by attenuation of the posterior background rhythm. Sleep was characterized by vertex waves, sleep spindles (12 to 14 Hz), maximal frontocentral region.   Physiologic photic driving was not seen during photic stimulation.  Hyperventilation was not performed.     ABNORMALITY -None    IMPRESSION: This study is within normal limits. No seizures or epileptiform discharges were seen throughout the recording. A normal interictal EEG does not exclude nor support the diagnosis of epilepsy.   Pastor Falling MD Neurology

## 2024-06-11 NOTE — Progress Notes (Signed)
EEG complete. Results pending.  ?

## 2024-06-11 NOTE — Progress Notes (Signed)
 Going to MRI.

## 2024-06-11 NOTE — ED Notes (Signed)
 [facility] called

## 2024-06-11 NOTE — Progress Notes (Signed)
 No charge note  Patient seen and examined this morning, just admitted with altered mental status and increased confusion.  Underwent an MRI and EEG today, both unremarkable.  He did have a recent hospitalization for bleed.  He tells me that he is not feeling back to baseline but close.  He reports an episode of dizziness when he transferred to the hospital bed.  Continue to monitor, if no further events anticipate discharge home tomorrow  Ajaya Crutchfield M. Trixie, MD, PhD [facility]  Between 7 am - 7 pm you can contact me via Amion (for emergencies) or Securechat (non urgent matters).  I am not available 7 pm - 7 am, please contact night coverage MD/APP via Amion

## 2024-06-11 NOTE — H&P (Addendum)
 History and Physical    [name] FMW:[id number] DOB: [birth year] DOA: 06/10/2024  PCP: Austin Mutton, MD  Patient coming from: home I have personally briefly reviewed patient's old medical records in [facility]  Chief Complaint: confusion   HPI: [name] is a 68 y.o. male with medical history significant of with history,Chron's disease, DMII,  seizure, hx of intermittent non-compliance with Keppra  due to financial barrier, who has interim history of admission 7/25-7/26 with diagnosis of seizure, head imaging with  SAH not requiring intervention per neurosurgery.  Patient returns to ED due to wife noting confusion and zoning out spells and ? Of left sided drop and mild aphasia. Patient notes he is complaint with his medications. He notes he feels off from his baseline, notes difficulty concentrating and getting his thoughts across and finding words which is different from his baseline. He notes no fever/ n/v/d/ chills / sob   ED Course:  Patient admitted for observation due to change from his baseline  Concern for post ictal period but noted very prolonged.  Patient is admitted for observation  Vitals: afeb, bp 169/89, hr 70, rr 22, sat 100%  Wbc 6, hgb 12.5, plt 182,  Na 139, K 3.3, cl 110, cr 0.86 Mag 1.6 cxrNAD CTH IMPRESSION: Previously seen subarachnoid hemorrhage on the right has resolved in the interval. No new focal abnormality is seen.  UA:neg UDS:neg  Tx 2gram mag, KCL 40, 400mg  mag-ox Review of Systems: As per HPI otherwise 10 point review of systems negative.   Past Medical History:  Diagnosis Date   Abdominal wall abscess 01/03/2017   Arthritis    Bladder stone    Cancer (HCC)    mycosis fungoides   Crohn's disease (HCC)    Crohn's disease with abscess (HCC) 07/23/2015   Diabetes mellitus without complication (HCC)    History of aseptic necrosis of bone BILATERAL HIPS   S/P BONE GRAFT   History of kidney stones    History of mycosis fungoides     S/P ileostomy (HCC)    Seizures (HCC)     Past Surgical History:  Procedure Laterality Date   BONE GRAFT OF LEFT HIP  11/13/1984   ASEPTIC NECROSIS   COLONOSCOPY     CYSTO/ BILATERAL RETROGRADE PYELOGRAM/ LEFT URETERAL STONE EXTRACTION / BILATERAL STENT PLACEMENT  10/07/2003   CYSTOSCOPY WITH LITHOLAPAXY  02/12/2012   Procedure: CYSTOSCOPY WITH LITHOLAPAXY;  Surgeon: Mark C Ottelin, MD;  Location: [facility] [place];  Service: Urology;  Laterality: N/A;   EXPL. LAP. W/ ENTEROLYSIS, RESECTION INFLAMMATORY MASS RLQ / TAKE-DOWN OF FISTULA WITH ENTEROENTEROSTOMY/ RESECTION WITH THE SMALL BOWEL TO ASCENDING COLON ANASTOMOSIS  10/29/2000   CROHN'S DISEASE W/ ANASTOMOTIC INFLAMMATORY MASS/    10-30-2000 EXPL. LAP. CONTROL POST-OP ABD. BLEEDING   EXPLORATORY LAPAROTOMY/ RESECTION OF ILEOCOLONIC ANASTOMOSIS AND CREATION OF ILEOSTOMY  11/04/2000   ABD. PERFORATION   HARDWARE REMOVAL Left 08/07/2022   Procedure: HARDWARE REMOVAL;  Surgeon: Yvone Rush, MD;  Location: [facility];  Service: Orthopedics;  Laterality: Left;   ILEOCECETOMY  1983   [employer] [facility]   ILEOSTOMY CLOSURE N/A 07/23/2015   Procedure: Takedown ileostomy and repair of ostomy hernia, extensive entrolysis (2.5 hrs), ileostransverse colon anastomosis; closure of massive ventral hernia with 20 x 30 Strattice mesh;  Surgeon: Donnice Lunger, MD;  Location: [facility];  Service: General;  Laterality: N/A;   INCISIONAL HERNIA REPAIR  07/23/2015   Strattice mesh   IRRIGATION AND DEBRIDEMENT ABSCESS Right  09/09/2014   Procedure: MINOR INCISION AND DRAINAGE OF ABSCESS;  Surgeon: Donnice Robinsons, MD;  Location: [facility];  Service: Orthopedics;  Laterality: Right;  incision and drainage right long finger    IRRIGATION AND DEBRIDEMENT ABSCESS N/A 01/03/2017   Procedure: IRRIGATION AND DEBRIDEMENT ABDOMINAL WALL ABSCESS;  Surgeon: Donnice Lunger, MD;  Location: [facility];  Service: General;  Laterality: N/A;    LAPAROSCOPY N/A 07/23/2015   Procedure: LAPAROSCOPY DIAGNOSTIC;  Surgeon: Donnice Lunger, MD;  Location: [facility];  Service: General;  Laterality: N/A;   ORIF ANKLE FRACTURE Left 08/27/2014   Procedure:  open reduction internal fixation left ankle;  Surgeon: Eva Elsie Herring, MD;  Location: [facility];  Service: Orthopedics;  Laterality: Left;  Left open reduction internal fixation ankle   RIGHT URETEROSCOPIC STONE EXTRACITON  04-24-2005  & 12-01-2002   TOTAL HIP ARTHROPLASTY Left 08/07/2022   Procedure: TOTAL HIP ARTHROPLASTY ANTERIOR APPROACH;  Surgeon: Yvone Rush, MD;  Location: [facility];  Service: Orthopedics;  Laterality: Left;     reports that he quit smoking about 29 years ago. His smoking use included cigarettes. He has never used smokeless tobacco. He reports that he does not drink alcohol  and does not use drugs.  Allergies  Allergen Reactions   Nsaids Other (See Comments)    Crohn's disease, told to avoid NSAIDs    Family History  Problem Relation Age of Onset   Alzheimer's disease Father    Cancer Father    Cancer Sister     Prior to Admission medications   Medication Sig Start Date End Date Taking? Authorizing Provider  levETIRAcetam  (KEPPRA ) 1000 MG tablet Take 1 tablet (1,000 mg total) by mouth 2 (two) times daily. 06/07/24  Yes Rosario Leatrice FERNS, MD  magnesium  oxide (MAG-OX) 400 (240 Mg) MG tablet Take 1 tablet (400 mg total) by mouth daily for 7 days. 06/07/24 06/14/24 Yes Rosario Leatrice FERNS, MD  metFORMIN  (GLUCOPHAGE ) 500 MG tablet Take 1 tablet (500 mg total) by mouth 2 (two) times daily. 06/07/24 09/05/24 Yes Rosario Leatrice FERNS, MD  tamsulosin  (FLOMAX ) 0.4 MG CAPS capsule Take 1 capsule (0.4 mg total) by mouth daily after supper. 06/07/24  Yes Rosario Leatrice FERNS, MD    Physical Exam: Vitals:   06/11/24 0515 06/11/24 0530 06/11/24 0600 06/11/24 0615  BP: 123/73 (!) 144/93 (!) 149/76 (!) 141/87  Pulse: 79 95 89 79  Resp: 18 (!) 31 16 17   Temp:      TempSrc:       SpO2: 98% 100% 97% 100%  Weight:      Height:        Constitutional: NAD, calm, comfortable Vitals:   06/11/24 0515 06/11/24 0530 06/11/24 0600 06/11/24 0615  BP: 123/73 (!) 144/93 (!) 149/76 (!) 141/87  Pulse: 79 95 89 79  Resp: 18 (!) 31 16 17   Temp:      TempSrc:      SpO2: 98% 100% 97% 100%  Weight:      Height:       Eyes: PERRL, lids and conjunctivae normal ENMT: Mucous membranes are moist. Posterior pharynx clear of any exudate or lesions.Normal dentition.  Neck: normal, supple, no masses, no thyromegaly Respiratory: clear to auscultation bilaterally, no wheezing, no crackles. Normal respiratory effort. No accessory muscle use.  Cardiovascular: Regular rate and rhythm, no murmurs / rubs / gallops. No extremity edema. 2+ pedal pulses. Abdomen: no tenderness, no masses palpated. No hepatosplenomegaly. Bowel sounds positive.  Musculoskeletal: no clubbing / cyanosis. No  joint deformity upper and lower extremities. Good ROM, no contractures. Normal muscle tone.  Skin: no rashes, lesions, ulcers. No induration Neurologic: CN grossly intact. Sensation intact, Strength 5/5 in all 4.  Psychiatric: . Alert and oriented x 3. Normal mood.    Labs on Admission: I have personally reviewed following labs and imaging studies  CBC: Recent Labs  Lab 06/06/24 1221 06/06/24 1910 06/07/24 0323 06/10/24 1908  WBC 6.3  --  5.5 6.0  NEUTROABS  --   --  3.6 4.0  HGB 12.8* 12.9* 11.3* 12.5*  HCT 40.8 38.0* 35.6* 39.1  MCV 90.1  --  87.9 89.1  PLT 162  --  158 182   Basic Metabolic Panel: Recent Labs  Lab 06/06/24 1221 06/06/24 1910 06/07/24 0323 06/07/24 1350 06/10/24 1908  NA 139 143 141 139 139  K 3.4* 3.2* 3.5 3.7 3.3*  CL 104 106 107 107 110  CO2 26  --  25 25 24   GLUCOSE 178* 111* 220* 158* 172*  BUN 9 6* 7* 8 12  CREATININE 1.10 1.00 0.99 1.06 0.86  CALCIUM  8.5*  --  8.4* 8.6* 8.1*  MG 1.4*  --  1.4* 1.6* 1.6*  PHOS  --   --   --  2.9  --    GFR: Estimated  Creatinine Clearance: 81.5 mL/min (by C-G formula based on SCr of 0.86 mg/dL). Liver Function Tests: Recent Labs  Lab 06/06/24 1221 06/07/24 0323 06/07/24 1350 06/10/24 1908  AST 20 17  --  15  ALT 16 13  --  10  ALKPHOS 51 42  --  44  BILITOT 0.7 0.3  --  <0.2  PROT 6.3* 5.3*  --  5.5*  ALBUMIN  3.0* 2.4* 2.7* 2.5*   No results for input(s): LIPASE, AMYLASE in the last 168 hours. No results for input(s): AMMONIA in the last 168 hours. Coagulation Profile: Recent Labs  Lab 06/07/24 0612  INR 1.1   Cardiac Enzymes: No results for input(s): CKTOTAL, CKMB, CKMBINDEX, TROPONINI in the last 168 hours. BNP (last 3 results) No results for input(s): PROBNP in the last 8760 hours. HbA1C: No results for input(s): HGBA1C in the last 72 hours. CBG: Recent Labs  Lab 06/06/24 1216 06/06/24 2058 06/07/24 0625 06/07/24 1204  GLUCAP 140* 106* 220* 124*   Lipid Profile: No results for input(s): CHOL, HDL, LDLCALC, TRIG, CHOLHDL, LDLDIRECT in the last 72 hours. Thyroid  Function Tests: No results for input(s): TSH, T4TOTAL, FREET4, T3FREE, THYROIDAB in the last 72 hours. Anemia Panel: No results for input(s): VITAMINB12, FOLATE, FERRITIN, TIBC, IRON, RETICCTPCT in the last 72 hours. Urine analysis:    Component Value Date/Time   COLORURINE YELLOW 06/10/2024 2131   APPEARANCEUR HAZY (A) 06/10/2024 2131   LABSPEC 1.021 06/10/2024 2131   PHURINE 5.0 06/10/2024 2131   GLUCOSEU 50 (A) 06/10/2024 2131   HGBUR NEGATIVE 06/10/2024 2131   BILIRUBINUR NEGATIVE 06/10/2024 2131   KETONESUR NEGATIVE 06/10/2024 2131   PROTEINUR NEGATIVE 06/10/2024 2131   UROBILINOGEN 0.2 02/16/2009 1931   NITRITE NEGATIVE 06/10/2024 2131   LEUKOCYTESUR NEGATIVE 06/10/2024 2131    Radiological Exams on Admission: CT Head Wo Contrast Result Date: 06/10/2024 CLINICAL DATA:  Altered mental status EXAM: CT HEAD WITHOUT CONTRAST TECHNIQUE: Contiguous axial  images were obtained from the base of the skull through the vertex without intravenous contrast. RADIATION DOSE REDUCTION: This exam was performed according to the departmental dose-optimization program which includes automated exposure control, adjustment of the mA and/or kV according to patient  size and/or use of iterative reconstruction technique. COMPARISON:  12/08/2023 FINDINGS: Brain: No evidence of acute infarction, hemorrhage, hydrocephalus, extra-axial collection or mass lesion/mass effect. Previously seen area of increased attenuation on the right shown to represent subarachnoid hemorrhage has resolved. No new hemorrhage is seen. Small lacunar infarct is noted posteriorly in the right thalamus. Vascular: No hyperdense vessel or unexpected calcification. Skull: Normal. Negative for fracture or focal lesion. Sinuses/Orbits: No acute finding. Other: None. IMPRESSION: Previously seen subarachnoid hemorrhage on the right has resolved in the interval. No new focal abnormality is seen. Electronically Signed   By: Oneil Devonshire M.D.   On: 06/10/2024 19:40   DG Chest Portable 1 View Result Date: 06/10/2024 CLINICAL DATA:  Altered mental status EXAM: PORTABLE CHEST 1 VIEW COMPARISON:  Chest x-ray 12/24/2022 FINDINGS: The heart size and mediastinal contours are within normal limits. Both lungs are clear. The visualized skeletal structures are unremarkable. IMPRESSION: No active disease. Electronically Signed   By: Greig Pique M.D.   On: 06/10/2024 19:23    EKG: Independently reviewed.   Assessment/Plan   Change in MS /Encephalopathy nos  -possible due to break through seizure ,post ictal - will f/u with EEG -neuro checks  -unclear cause  -neurology for further evaluation  -MRI   Hx of SAH POA No further work up per neuro surgery   Hypomagnesemia Hypokalemia -replete prn   Chron's disease -no acute flare    DMII -iss/fs    Seizure -continue keppra    DVT prophylaxis:  Code Status:  full/ as discussed per patient wishes in event of cardiac arrest  Family Communication: none at bedside Disposition Plan: patient  expected to be admitted less than 2 midnights  Consults called: Dr Librada Admission status: progressive      Camila DELENA Ned MD [facility]   If 7PM-7AM, please contact night-coverage [email or web address] Password [email or web address]  06/11/2024, 6:52 AM

## 2024-06-12 ENCOUNTER — Other Ambulatory Visit (HOSPITAL_COMMUNITY): Payer: Self-pay

## 2024-06-12 DIAGNOSIS — R410 Disorientation, unspecified: Secondary | ICD-10-CM | POA: Diagnosis not present

## 2024-06-12 LAB — CBC
HCT: 37.7 % — ABNORMAL LOW (ref 39.0–52.0)
Hemoglobin: 12.1 g/dL — ABNORMAL LOW (ref 13.0–17.0)
MCH: 27.8 pg (ref 26.0–34.0)
MCHC: 32.1 g/dL (ref 30.0–36.0)
MCV: 86.7 fL (ref 80.0–100.0)
Platelets: 164 K/uL (ref 150–400)
RBC: 4.35 MIL/uL (ref 4.22–5.81)
RDW: 13.2 % (ref 11.5–15.5)
WBC: 5.5 K/uL (ref 4.0–10.5)
nRBC: 0 % (ref 0.0–0.2)

## 2024-06-12 LAB — COMPREHENSIVE METABOLIC PANEL WITH GFR
ALT: 9 U/L (ref 0–44)
AST: 13 U/L — ABNORMAL LOW (ref 15–41)
Albumin: 2.5 g/dL — ABNORMAL LOW (ref 3.5–5.0)
Alkaline Phosphatase: 51 U/L (ref 38–126)
Anion gap: 7 (ref 5–15)
BUN: 10 mg/dL (ref 8–23)
CO2: 24 mmol/L (ref 22–32)
Calcium: 8.5 mg/dL — ABNORMAL LOW (ref 8.9–10.3)
Chloride: 107 mmol/L (ref 98–111)
Creatinine, Ser: 0.89 mg/dL (ref 0.61–1.24)
GFR, Estimated: >60 mL/min (ref >60)
Glucose, Bld: 190 mg/dL — ABNORMAL HIGH (ref 70–99)
Potassium: 3.9 mmol/L (ref 3.5–5.1)
Sodium: 138 mmol/L (ref 135–145)
Total Bilirubin: 0.5 mg/dL (ref 0.0–1.2)
Total Protein: 5.4 g/dL — ABNORMAL LOW (ref 6.5–8.1)

## 2024-06-12 LAB — GLUCOSE, CAPILLARY: Glucose-Capillary: 124 mg/dL — ABNORMAL HIGH (ref 70–99)

## 2024-06-12 MED ORDER — LEVETIRACETAM 1000 MG PO TABS
1000.0000 mg | ORAL_TABLET | Freq: Two times a day (BID) | ORAL | 0 refills | Status: DC
  Filled 2024-06-12: qty 180, 90d supply, fill #0
  Filled 2024-07-29: qty 60, 30d supply, fill #0

## 2024-06-12 NOTE — Discharge Instructions (Signed)
 Follow with Austin Mutton, MD in 5-7 days  Please get a complete blood count and chemistry panel checked by your Primary MD at your next visit, and again as instructed by your Primary MD. Please get your medications reviewed and adjusted by your Primary MD.  Please request your Primary MD to go over all Hospital Tests and Procedure/Radiological results at the follow up, please get all Hospital records sent to your Prim MD by signing hospital release before you go home.  In some cases, there will be blood work, cultures and biopsy results pending at the time of your discharge. Please request that your primary care M.D. goes through all the records of your hospital data and follows up on these results.  If you had Pneumonia of Lung problems at the Hospital: Please get a 2 view Chest X ray done in 6-8 weeks after hospital discharge or sooner if instructed by your Primary MD.  If you have Congestive Heart Failure: Please call your Cardiologist or Primary MD anytime you have any of the following symptoms:  1) 3 pound weight gain in 24 hours or 5 pounds in 1 week  2) shortness of breath, with or without a dry hacking cough  3) swelling in the hands, feet or stomach  4) if you have to sleep on extra pillows at night in order to breathe  Follow cardiac low salt diet and 1.5 lit/day fluid restriction.  If you have diabetes Accuchecks 4 times/day, Once in AM empty stomach and then before each meal. Log in all results and show them to your primary doctor at your next visit. If any glucose reading is under 80 or above 300 call your primary MD immediately.  If you have Seizure/Convulsions/Epilepsy: Please do not drive, operate heavy machinery, participate in activities at heights or participate in high speed sports until you have seen by Primary MD or a Neurologist and advised to do so again. Per [place]  DM[place] statutes, patients with seizures are not allowed to drive until they have been seizure-free  for six months.  Use caution when using heavy equipment or power tools. Avoid working on ladders or at heights. Take showers instead of baths. Ensure the water  temperature is not too high on the home water  heater. Do not go swimming alone. Do not lock yourself in a room alone (i.e. bathroom). When caring for infants or small children, sit down when holding, feeding, or changing them to minimize risk of injury to the child in the event you have a seizure. Maintain good sleep hygiene. Avoid alcohol .   If you had Gastrointestinal Bleeding: Please ask your Primary MD to check a complete blood count within one week of discharge or at your next visit. Your endoscopic/colonoscopic biopsies that are pending at the time of discharge, will also need to followed by your Primary MD.  Get Medicines reviewed and adjusted. Please take all your medications with you for your next visit with your Primary MD  Please request your Primary MD to go over all hospital tests and procedure/radiological results at the follow up, please ask your Primary MD to get all Hospital records sent to his/her office.  If you experience worsening of your admission symptoms, develop shortness of breath, life threatening emergency, suicidal or homicidal thoughts you must seek medical attention immediately by calling 911 or calling your MD immediately  if symptoms less severe.  You must read complete instructions/literature along with all the possible adverse reactions/side effects for all the Medicines you take  and that have been prescribed to you. Take any new Medicines after you have completely understood and accpet all the possible adverse reactions/side effects.   Do not drive or operate heavy machinery when taking Pain medications.   Do not take more than prescribed Pain, Sleep and Anxiety Medications  Special Instructions: If you have smoked or chewed Tobacco  in the last 2 yrs please stop smoking, stop any regular Alcohol   and or  any Recreational drug use.  Wear Seat belts while driving.  Please note You were cared for by a hospitalist during your hospital stay. If you have any questions about your discharge medications or the care you received while you were in the hospital after you are discharged, you can call the unit and asked to speak with the hospitalist on call if the hospitalist that took care of you is not available. Once you are discharged, your primary care physician will handle any further medical issues. Please note that NO REFILLS for any discharge medications will be authorized once you are discharged, as it is imperative that you return to your primary care physician (or establish a relationship with a primary care physician if you do not have one) for your aftercare needs so that they can reassess your need for medications and monitor your lab values.  You can reach the hospitalist office at phone [phone] or fax [phone]   If you do not have a primary care physician, you can call [phone] for a physician referral.  Activity: As tolerated with Full fall precautions use walker/cane & assistance as needed    Diet: regular  Disposition Home

## 2024-06-12 NOTE — Care Management (Signed)
  Transition of Care [facility]) Screening Note   Patient Details  Name: [name] Date of Birth: 1956/10/25   Transition of Care [facility]) CM/SW Contact:    Corean JAYSON Canary, RN Phone Number: 06/12/2024, 8:47 AM    Transition of Care Department [facility]) has reviewed patient and no TOC needs have been identified at this time. We will continue to monitor patient advancement through interdisciplinary progression rounds. If new patient transition needs arise, please place a TOC consult.  Patient is discharging home today

## 2024-06-12 NOTE — Discharge Summary (Signed)
 Physician Discharge Summary  [name] FMW:[id number] DOB: 1955-11-17 DOA: 06/10/2024  PCP: Austin Mutton, MD  Admit date: 06/10/2024 Discharge date: 06/12/2024  Admitted From: home Disposition:  home  Recommendations for Outpatient Follow-up:  Follow up with PCP in 1-2 weeks Follow up with neurology as an outpatient  Home Health: none Equipment/Devices: none  Discharge Condition: stable CODE STATUS: Full code Diet Orders (From admission, onward)     Start     Ordered   2024/06/15 0651  Diet heart healthy/carb modified Room service appropriate? Yes; Fluid consistency: Thin  Diet effective now       Question Answer Comment  Diet-HS Snack? Nothing   Room service appropriate? Yes   Fluid consistency: Thin      2024-06-15 0651            HPI: Per admitting MD,  [name] is a 68 y.o. male with medical history significant of with history,Chron's disease, DMII,  seizure, hx of intermittent non-compliance with Keppra  due to financial barrier, who has interim history of admission 7/25-7/26 with diagnosis of seizure, head imaging with  SAH not requiring intervention per neurosurgery. Patient returns to ED due to wife noting confusion and zoning out spells and ? Of left sided drop and mild aphasia. Patient notes he is complaint with his medications. He notes he feels off from his baseline, notes difficulty concentrating and getting his thoughts across and finding words which is different from his baseline. He notes no fever/ n/v/d/ chills / sob   Hospital Course / Discharge diagnoses: Principal problem Encephalopathy, confusion -patient presented to the hospital with slight confusion, possibly due to breakthrough seizures in the setting of not having Keppra  at home, postictal.  He underwent brain images with MRI which was negative for acute findings, EEG was negative for epileptiform discharges.  His Keppra  was resumed, he is feeling better has not had any further episodes and  will be discharged home in stable condition  Active problems History of SAH-seen by neurosurgery last time he was hospitalized, no further workup.  Repeat brain imaging with improvement in his prior bleeding Hypokalemia, hypomagnesemia-replaced Crohn's disease-no acute flare DM2-resume home medications Seizure disorder-continue Keppra , a 90-day supply has been refilled prior to discharge.  Sepsis ruled out   Discharge Instructions   Allergies as of 06/12/2024       Reactions   Nsaids Other (See Comments)   Crohn's disease, told to avoid NSAIDs        Medication List     TAKE these medications    levETIRAcetam  1000 MG tablet Commonly known as: KEPPRA  Take 1 tablet (1,000 mg total) by mouth 2 (two) times daily.   magnesium  oxide 400 (240 Mg) MG tablet Commonly known as: MAG-OX Take 1 tablet (400 mg total) by mouth daily for 7 days.   metFORMIN  500 MG tablet Commonly known as: GLUCOPHAGE  Take 1 tablet (500 mg total) by mouth 2 (two) times daily.   tamsulosin  0.4 MG Caps capsule Commonly known as: FLOMAX  Take 1 capsule (0.4 mg total) by mouth daily after supper.       Consultations: none  Procedures/Studies:  EEG adult Result Date: Jun 15, 2024 Gregg Lek, MD     06-15-24 12:39 PM Patient Name: [name] MRN: [id number] Epilepsy Attending: Lek Gregg Referring Physician/Provider: No ref. provider found     Date: 15-Jun-2024 Duration: 23 minutes Patient history: 69 year old man with altered mental status and increase confusion Level of alertness: Awake AEDs during EEG  study: LEV Technical aspects: This EEG study was done with scalp electrodes positioned according to the 10-20 International system of electrode placement. Electrical activity was reviewed with band pass filter of 1-70Hz , sensitivity of 7 uV/mm, display speed of 63mm/sec with a 60Hz  notched filter applied as appropriate. EEG data were recorded continuously and digitally stored.  Video monitoring  was available and reviewed as appropriate. Description: The posterior dominant rhythm consists of 9 Hz activity of moderate voltage (25-35 uV) seen predominantly in posterior head regions, symmetric and reactive to eye opening and eye closing. Drowsiness was characterized by attenuation of the posterior background rhythm. Sleep was characterized by vertex waves, sleep spindles (12 to 14 Hz), maximal frontocentral region.   Physiologic photic driving was not seen during photic stimulation.  Hyperventilation was not performed.   ABNORMALITY -None IMPRESSION: This study is within normal limits. No seizures or epileptiform discharges were seen throughout the recording. A normal interictal EEG does not exclude nor support the diagnosis of epilepsy. Pastor Falling MD Neurology    MR BRAIN WO CONTRAST Result Date: 06/11/2024 CLINICAL DATA:  Provided history: Mental status change, unknown cause. EXAM: MRI HEAD WITHOUT CONTRAST TECHNIQUE: Multiplanar, multiecho pulse sequences of the brain and surrounding structures were obtained without intravenous contrast. COMPARISON:  Head CT 06/10/2024. Brain MRI 06/06/2024. FINDINGS: Brain: Mild generalized cerebral atrophy. Persistent hemosiderin deposition scattered along the bilateral frontal and parietal lobes from prior subarachnoid hemorrhage. 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 Background mild-to-moderate for age multifocal T2 FLAIR hyperintense signal abnormality within the cerebral white matter, nonspecific but compatible with chronic small vessel ischemic disease. There is no acute infarct. No evidence of an intracranial mass. No extra-axial fluid collection. No midline shift. Vascular: Maintained flow voids within the proximal large arterial vessels. Skull and upper cervical spine: No focal worrisome marrow lesion. Incompletely assessed cervical spondylosis. Sinuses/Orbits: No mass or acute finding within the imaged orbits. No  significant paranasal sinus disease. Other: Trace fluid within bilateral mastoid air cells. IMPRESSION: 1. No evidence of an acute or recent subacute infarction. 2. Persistent hemosiderin deposition scattered along the bilateral frontal and parietal lobes from prior subarachnoid hemorrhage. 3. Stable background parenchymal atrophy and chronic small vessel ischemic disease. Electronically Signed   By: Rockey Childs D.O.   On: 06/11/2024 09:36   CT Head Wo Contrast Result Date: 06/10/2024 CLINICAL DATA:  Altered mental status EXAM: CT HEAD WITHOUT CONTRAST TECHNIQUE: Contiguous axial images were obtained from the base of the skull through the vertex without intravenous contrast. RADIATION DOSE REDUCTION: This exam was performed according to the departmental dose-optimization program which includes automated exposure control, adjustment of the mA and/or kV according to patient size and/or use of iterative reconstruction technique. COMPARISON:  12/08/2023 FINDINGS: Brain: No evidence of acute infarction, hemorrhage, hydrocephalus, extra-axial collection or mass lesion/mass effect. Previously seen area of increased attenuation on the right shown to represent subarachnoid hemorrhage has resolved. No new hemorrhage is seen. Small lacunar infarct is noted posteriorly in the right thalamus. Vascular: No hyperdense vessel or unexpected calcification. Skull: Normal. Negative for fracture or focal lesion. Sinuses/Orbits: No acute finding. Other: None. IMPRESSION: Previously seen subarachnoid hemorrhage on the right has resolved in the interval. No new focal abnormality is seen. Electronically Signed   By: Oneil Devonshire M.D.   On: 06/10/2024 19:40   DG Chest Portable 1 View Result Date: 06/10/2024 CLINICAL DATA:  Altered mental status EXAM: PORTABLE CHEST 1 VIEW COMPARISON:  Chest x-ray 12/24/2022 FINDINGS: The heart  size and mediastinal contours are within normal limits. Both lungs are clear. The visualized skeletal  structures are unremarkable. IMPRESSION: No active disease. Electronically Signed   By: Greig Pique M.D.   On: 06/10/2024 19:23   MR Brain W and Wo Contrast Result Date: 06/06/2024 CLINICAL DATA:  Provided history: Mental status change, unknown cause. Additional history provided: Known seizure disorder with witnessed seizure at home. Fall (with head trauma) during seizure. EXAM: MRI HEAD WITHOUT AND WITH CONTRAST TECHNIQUE: Multiplanar, multiecho pulse sequences of the brain and surrounding structures were obtained without and with intravenous contrast. CONTRAST:  8mL GADAVIST  GADOBUTROL  1 MMOL/ML IV SOLN COMPARISON:  Head CT 06/06/2024.  Brain MRI 12/20/2022. FINDINGS: Brain: Mild generalized cerebral volume loss. Small-volume acute subarachnoid hemorrhage along the right temporoparietal junction and within the posterior aspect of the right sylvian fissure. Additionally, there is this corresponds with the focus of hyperdensity described on the head CT performed earlier today. Small-volume acute subarachnoid hemorrhage scattered elsewhere along the bilateral frontal and parietal convexities. Redemonstrated small chronic lacunar infarct at the right thalamocapsular junction. Background mild-to-moderate for age multifocal T2 FLAIR hyperintense signal abnormality within the cerebral white matter and pons, nonspecific but compatible chronic small vessel ischemic disease. No cortical encephalomalacia is identified. No appreciable hippocampal size or signal asymmetry. There is no acute infarct. No evidence of an intracranial mass. No midline shift or hydrocephalus. No pathologic intracranial enhancement identified. Vascular: Maintained flow voids within the proximal large arterial vessels. Skull and upper cervical spine: No focal worrisome marrow lesion. Incompletely assessed cervical spondylosis. Sinuses/Orbits: No mass or acute finding within the imaged orbits. No significant paranasal sinus disease. Other: Right  parietooccipital scalp hematoma. Trace fluid within bilateral mastoid air cells. Impression #1 called by telephone at the time of interpretation on 06/06/2024 at 5:50 pm to provider Dr. Lenor, who verbally acknowledged these results. IMPRESSION: 1. Small-volume acute subarachnoid hemorrhage scattered along the bilateral cerebral convexities, and within the right sylvian fissure. 2. Background parenchymal atrophy and chronic small vessel ischemic disease, stable as compared to the MRI of 12/20/2022. 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 3. Mild generalized cerebral volume loss. 4. Right parietooccipital scalp hematoma. Electronically Signed   By: Rockey Childs D.O.   On: 06/06/2024 17:54   CT Head Wo Contrast Addendum Date: 06/06/2024 ADDENDUM REPORT: 06/06/2024 13:51 ADDENDUM: Traumatic Brain Injury Risk Stratification Skull Fracture: No - Low/mBIG 1 Subdural Hematoma (SDH): No - Low Subarachnoid Hemorrhage [facility]): No Epidural Hematoma (EDH): No - Low/mBIG 1 Cerebral contusion, intra-axial, intraparenchymal Hemorrhage (IPH): Yes Intraventricular Hemorrhage (IVH): No - Low/mBIG 1 Midline Shift > 1mm or Edema/effacement of sulci/vents: No - Low/mBIG 1 ---------------------------------------------------- These results were called by telephone at the time of interpretation on 06/06/2024 at 1:50 pm to provider Dr. CURTISTINE DAWN , who verbally acknowledged these results. Electronically Signed   By: Evalene Coho M.D.   On: 06/06/2024 13:51   Result Date: 06/06/2024 CLINICAL DATA:  Head trauma, intracranial arterial injury suspected EXAM: CT HEAD WITHOUT CONTRAST TECHNIQUE: Contiguous axial images were obtained from the base of the skull through the vertex without intravenous contrast. RADIATION DOSE REDUCTION: This exam was performed according to the departmental dose-optimization program which includes automated exposure control, adjustment of the mA and/or kV according to  patient size and/or use of iterative reconstruction technique. COMPARISON:  CT of the head dated August 12, 2023. FINDINGS: Brain: There is a new area of increased attenuation present at the right parietotemporal junction along  the posterior margin of the sylvian fissure, which appears represent cortical and subcortical hemorrhage or calcification. There are prominent dural calcifications along the cerebral vertices. No other definite areas of intracranial hemorrhage. Vascular: Mild vascular calcifications. Skull: Intact and unremarkable. Sinuses/Orbits: Clear paranasal sinuses.  Normal orbits. Other: None. IMPRESSION: 1. There is a new area of increased density seen along the junction of the right parietal and temporal lobes, which is concerning for focal cortical/subcortical hemorrhage. Electronically Signed: By: Evalene Coho M.D. On: 06/06/2024 13:45   CT Cervical Spine Wo Contrast Result Date: 06/06/2024 CLINICAL DATA:  Neck trauma (Age >= 65y) EXAM: CT CERVICAL SPINE WITHOUT CONTRAST TECHNIQUE: Multidetector CT imaging of the cervical spine was performed without intravenous contrast. Multiplanar CT image reconstructions were also generated. RADIATION DOSE REDUCTION: This exam was performed according to the departmental dose-optimization program which includes automated exposure control, adjustment of the mA and/or kV according to patient size and/or use of iterative reconstruction technique. COMPARISON:  None Available. FINDINGS: Alignment: Normal. Skull base and vertebrae: No acute fracture. No primary bone lesion or focal pathologic process. Soft tissues and spinal canal: No prevertebral fluid or swelling. No visible canal hematoma. Disc levels: Moderate chronic degenerative disc disease at C4-5, C5-6 and C6-7, with mild-to-moderate central spinal canal stenosis at each level. There is also mild chronic degenerative disc disease at C3-4, with mild central spinal canal stenosis. Upper chest: The  lung apices are clear. Other: None. IMPRESSION: Multilevel chronic degenerative disc disease. No evidence of acute traumatic injury. Electronically Signed   By: Evalene Coho M.D.   On: 06/06/2024 13:49     Subjective: - no chest pain, shortness of breath, no abdominal pain, nausea or vomiting.   Discharge Exam: BP 135/86 (BP Location: Right Arm)   Pulse 76   Temp 97.8 F (36.6 C) (Oral)   Resp 18   Ht 5' 6 (1.676 m)   Wt 82 kg   SpO2 97%   BMI 29.18 kg/m   General: Pt is alert, awake, not in acute distress Cardiovascular: RRR, S1/S2 +, no rubs, no gallops Respiratory: CTA bilaterally, no wheezing, no rhonchi Abdominal: Soft, NT, ND, bowel sounds + Extremities: no edema, no cyanosis    The results of significant diagnostics from this hospitalization (including imaging, microbiology, ancillary and laboratory) are listed below for reference.     Microbiology: Recent Results (from the past 240 hours)  MRSA Next Gen by PCR, Nasal     Status: None   Collection Time: 06/06/24 11:05 PM   Specimen: Nasal Mucosa; Nasal Swab  Result Value Ref Range Status   MRSA by PCR Next Gen NOT DETECTED NOT DETECTED Final    Comment: (NOTE) The GeneXpert MRSA Assay (FDA approved for NASAL specimens only), is one component of a comprehensive MRSA colonization surveillance program. It is not intended to diagnose MRSA infection nor to guide or monitor treatment for MRSA infections. Test performance is not FDA approved in patients less than 37 years old. Performed at [facility], [address] [place], [place] [address]      Labs: Basic Metabolic Panel: Recent Labs  Lab 06/06/24 1221 06/06/24 1910 06/07/24 0323 06/07/24 1350 06/10/24 1908 06/11/24 1414 06/12/24 0542  NA 139   < > 141 139 139 139 138  K 3.4*   < > 3.5 3.7 3.3* 3.7 3.9  CL 104   < > 107 107 110 106 107  CO2 26  --  25 25 24 24 24   GLUCOSE  178*   < > 220* 158* 172* 187* 190*  BUN 9   < > 7* 8 12 9 10    CREATININE 1.10   < > 0.99 1.06 0.86 0.94 0.89  CALCIUM  8.5*  --  8.4* 8.6* 8.1* 8.5* 8.5*  MG 1.4*  --  1.4* 1.6* 1.6* 2.2  --   PHOS  --   --   --  2.9  --  2.0*  --    < > = values in this interval not displayed.   Liver Function Tests: Recent Labs  Lab 06/06/24 1221 06/07/24 0323 06/07/24 1350 06/10/24 1908 06/12/24 0542  AST 20 17  --  15 13*  ALT 16 13  --  10 9  ALKPHOS 51 42  --  44 51  BILITOT 0.7 0.3  --  <0.2 0.5  PROT 6.3* 5.3*  --  5.5* 5.4*  ALBUMIN  3.0* 2.4* 2.7* 2.5* 2.5*   CBC: Recent Labs  Lab 06/06/24 1221 06/06/24 1910 06/07/24 0323 06/10/24 1908 06/12/24 0542  WBC 6.3  --  5.5 6.0 5.5  NEUTROABS  --   --  3.6 4.0  --   HGB 12.8* 12.9* 11.3* 12.5* 12.1*  HCT 40.8 38.0* 35.6* 39.1 37.7*  MCV 90.1  --  87.9 89.1 86.7  PLT 162  --  158 182 164   CBG: Recent Labs  Lab 06/07/24 0625 06/07/24 1204 06/11/24 0743 06/11/24 1334 06/11/24 1652  GLUCAP 220* 124* 129* 213* 195*   Hgb A1c No results for input(s): HGBA1C in the last 72 hours. Lipid Profile No results for input(s): CHOL, HDL, LDLCALC, TRIG, CHOLHDL, LDLDIRECT in the last 72 hours. Thyroid  function studies Recent Labs    06/11/24 1414  TSH 1.233   Urinalysis    Component Value Date/Time   COLORURINE YELLOW 06/10/2024 2131   APPEARANCEUR HAZY (A) 06/10/2024 2131   LABSPEC 1.021 06/10/2024 2131   PHURINE 5.0 06/10/2024 2131   GLUCOSEU 50 (A) 06/10/2024 2131   HGBUR NEGATIVE 06/10/2024 2131   BILIRUBINUR NEGATIVE 06/10/2024 2131   KETONESUR NEGATIVE 06/10/2024 2131   PROTEINUR NEGATIVE 06/10/2024 2131   UROBILINOGEN 0.2 02/16/2009 1931   NITRITE NEGATIVE 06/10/2024 2131   LEUKOCYTESUR NEGATIVE 06/10/2024 2131    FURTHER DISCHARGE INSTRUCTIONS:   Get Medicines reviewed and adjusted: Please take all your medications with you for your next visit with your Primary MD   Laboratory/radiological data: Please request your Primary MD to go over all hospital tests  and procedure/radiological results at the follow up, please ask your Primary MD to get all Hospital records sent to his/her office.   In some cases, they will be blood work, cultures and biopsy results pending at the time of your discharge. Please request that your primary care M.D. goes through all the records of your hospital data and follows up on these results.   Also Note the following: If you experience worsening of your admission symptoms, develop shortness of breath, life threatening emergency, suicidal or homicidal thoughts you must seek medical attention immediately by calling 911 or calling your MD immediately  if symptoms less severe.   You must read complete instructions/literature along with all the possible adverse reactions/side effects for all the Medicines you take and that have been prescribed to you. Take any new Medicines after you have completely understood and accpet all the possible adverse reactions/side effects.    Do not drive when taking Pain medications or sleeping medications (Benzodaizepines)   Do not take more than  prescribed Pain, Sleep and Anxiety Medications. It is not advisable to combine anxiety,sleep and pain medications without talking with your primary care practitioner   Special Instructions: If you have smoked or chewed Tobacco  in the last 2 yrs please stop smoking, stop any regular Alcohol   and or any Recreational drug use.   Wear Seat belts while driving.   Please note: You were cared for by a hospitalist during your hospital stay. Once you are discharged, your primary care physician will handle any further medical issues. Please note that NO REFILLS for any discharge medications will be authorized once you are discharged, as it is imperative that you return to your primary care physician (or establish a relationship with a primary care physician if you do not have one) for your post hospital discharge needs so that they can reassess your need for  medications and monitor your lab values.  Time coordinating discharge: 35 minutes  SIGNED:  Nilda Fendt, MD, PhD 06/12/2024, 8:21 AM

## 2024-06-12 NOTE — Progress Notes (Signed)
 Pt with orders to d/c home. PIV removed. Assessment documented. Pt discharge education and packet provided all questions answered. Pt and all belongings transported via wheelchair to private vehicle.

## 2024-06-17 ENCOUNTER — Telehealth: Payer: Self-pay | Admitting: Neurology

## 2024-06-17 NOTE — Telephone Encounter (Signed)
 Lvm 1st attempt by hf 06/17/24

## 2024-06-17 NOTE — Telephone Encounter (Signed)
 Pt wife called to request to  speak to MD. Pt  wife stated that  the firday before last Pt had a seizure  and fell and hit his head . Pt wife wants to know if Pt should be driving . Pt wife would like to know if that alright with MD .   Pt wife would like to be called about this due to Pt not listening .  Karna Remington [phone]

## 2024-06-17 NOTE — Telephone Encounter (Signed)
 Call to wife, she reports [facility] 2 Fridays a go and patient had head strike and went to ER. Non compliant with medications and still driving. Advised she was able to report to [facility], She denies alcohol  use and says he has recently passed kidney stones. She was not at home and unable to verify medications. She states she will call back when she gets home and have husband with her.

## 2024-07-29 ENCOUNTER — Other Ambulatory Visit (HOSPITAL_COMMUNITY): Payer: Self-pay

## 2024-10-07 ENCOUNTER — Emergency Department (HOSPITAL_COMMUNITY)

## 2024-10-07 ENCOUNTER — Other Ambulatory Visit: Payer: Self-pay

## 2024-10-07 ENCOUNTER — Ambulatory Visit: Payer: Self-pay

## 2024-10-07 ENCOUNTER — Emergency Department (HOSPITAL_COMMUNITY)
Admission: EM | Admit: 2024-10-07 | Discharge: 2024-10-07 | Disposition: A | Discharge status: Home / Self Care | Source: Ambulatory Visit | Attending: Emergency Medicine | Admitting: Emergency Medicine

## 2024-10-07 DIAGNOSIS — R471 Dysarthria and anarthria: Secondary | ICD-10-CM | POA: Diagnosis not present

## 2024-10-07 DIAGNOSIS — I6782 Cerebral ischemia: Secondary | ICD-10-CM | POA: Diagnosis not present

## 2024-10-07 DIAGNOSIS — K5090 Crohn's disease, unspecified, without complications: Secondary | ICD-10-CM | POA: Diagnosis not present

## 2024-10-07 DIAGNOSIS — E042 Nontoxic multinodular goiter: Secondary | ICD-10-CM | POA: Diagnosis not present

## 2024-10-07 DIAGNOSIS — Z7984 Long term (current) use of oral hypoglycemic drugs: Secondary | ICD-10-CM | POA: Insufficient documentation

## 2024-10-07 DIAGNOSIS — R4182 Altered mental status, unspecified: Secondary | ICD-10-CM | POA: Diagnosis not present

## 2024-10-07 DIAGNOSIS — G40909 Epilepsy, unspecified, not intractable, without status epilepticus: Secondary | ICD-10-CM | POA: Diagnosis not present

## 2024-10-07 DIAGNOSIS — R410 Disorientation, unspecified: Secondary | ICD-10-CM | POA: Diagnosis present

## 2024-10-07 DIAGNOSIS — Z8673 Personal history of transient ischemic attack (TIA), and cerebral infarction without residual deficits: Secondary | ICD-10-CM | POA: Diagnosis not present

## 2024-10-07 DIAGNOSIS — E119 Type 2 diabetes mellitus without complications: Secondary | ICD-10-CM | POA: Insufficient documentation

## 2024-10-07 LAB — COMPREHENSIVE METABOLIC PANEL WITH GFR
ALT: 10 U/L (ref 0–44)
AST: 22 U/L (ref 15–41)
Albumin: 4.1 g/dL (ref 3.5–5.0)
Alkaline Phosphatase: 70 U/L (ref 38–126)
Anion gap: 10 (ref 5–15)
BUN: 11 mg/dL (ref 8–23)
CO2: 29 mmol/L (ref 22–32)
Calcium: 9.5 mg/dL (ref 8.9–10.3)
Chloride: 103 mmol/L (ref 98–111)
Creatinine, Ser: 1.03 mg/dL (ref 0.61–1.24)
GFR, Estimated: >60 mL/min (ref >60)
Glucose, Bld: 136 mg/dL — ABNORMAL HIGH (ref 70–99)
Potassium: 4.4 mmol/L (ref 3.5–5.1)
Sodium: 143 mmol/L (ref 135–145)
Total Bilirubin: 0.3 mg/dL (ref 0.0–1.2)
Total Protein: 7.7 g/dL (ref 6.5–8.1)

## 2024-10-07 LAB — URINALYSIS, ROUTINE W REFLEX MICROSCOPIC
Bilirubin Urine: NEGATIVE
Glucose, UA: NEGATIVE mg/dL
Hgb urine dipstick: NEGATIVE
Ketones, ur: NEGATIVE mg/dL
Leukocytes,Ua: NEGATIVE
Nitrite: NEGATIVE
Protein, ur: NEGATIVE mg/dL
Specific Gravity, Urine: 1.018 (ref 1.005–1.030)
pH: 6 (ref 5.0–8.0)

## 2024-10-07 LAB — CBC WITH DIFFERENTIAL/PLATELET
Abs Immature Granulocytes: 0.01 K/uL (ref 0.00–0.07)
Basophils Absolute: 0 K/uL (ref 0.0–0.1)
Basophils Relative: 0 %
Eosinophils Absolute: 0 K/uL (ref 0.0–0.5)
Eosinophils Relative: 1 %
HCT: 40.2 % (ref 39.0–52.0)
Hemoglobin: 12.9 g/dL — ABNORMAL LOW (ref 13.0–17.0)
Immature Granulocytes: 0 %
Lymphocytes Relative: 21 %
Lymphs Abs: 1.2 K/uL (ref 0.7–4.0)
MCH: 28.4 pg (ref 26.0–34.0)
MCHC: 32.1 g/dL (ref 30.0–36.0)
MCV: 88.4 fL (ref 80.0–100.0)
Monocytes Absolute: 0.4 K/uL (ref 0.1–1.0)
Monocytes Relative: 7 %
Neutro Abs: 4 K/uL (ref 1.7–7.7)
Neutrophils Relative %: 71 %
Platelets: 177 K/uL (ref 150–400)
RBC: 4.55 MIL/uL (ref 4.22–5.81)
RDW: 13.7 % (ref 11.5–15.5)
WBC: 5.7 K/uL (ref 4.0–10.5)
nRBC: 0 % (ref 0.0–0.2)

## 2024-10-07 LAB — URINE DRUG SCREEN
Amphetamines: NEGATIVE
Barbiturates: NEGATIVE
Benzodiazepines: NEGATIVE
Cocaine: NEGATIVE
Fentanyl: NEGATIVE
Methadone Scn, Ur: NEGATIVE
Opiates: NEGATIVE
Tetrahydrocannabinol: NEGATIVE

## 2024-10-07 LAB — PROTIME-INR
INR: 1 (ref 0.8–1.2)
Prothrombin Time: 13.5 s (ref 11.4–15.2)

## 2024-10-07 LAB — TROPONIN T, HIGH SENSITIVITY: Troponin T High Sensitivity: <15 ng/L (ref 0–19)

## 2024-10-07 MED ORDER — LEVETIRACETAM 1000 MG PO TABS: 1000.0000 mg | ORAL_TABLET | Freq: Two times a day (BID) | ORAL | 0 refills | Status: DC

## 2024-10-07 MED ORDER — IOHEXOL 350 MG/ML SOLN
75.0000 mL | Freq: Once | INTRAVENOUS | Status: AC | PRN
  Administered 2024-10-07: 75 mL via INTRAVENOUS

## 2024-10-07 MED ORDER — SODIUM CHLORIDE (PF) 0.9 % IJ SOLN
INTRAMUSCULAR | Status: AC
Start: 2024-10-07 — End: 2024-10-07
  Filled 2024-10-07: qty 50

## 2024-10-07 MED ORDER — LEVETIRACETAM (KEPPRA) 500 MG/5 ML ADULT IV PUSH
1000.0000 mg | Freq: Once | INTRAVENOUS | Status: AC
  Administered 2024-10-07: 1000 mg via INTRAVENOUS
  Filled 2024-10-07: qty 10

## 2024-10-07 NOTE — Discharge Instructions (Addendum)
 Please continue to take Keppra  as previously prescribed.  Follow-up with neurologist in 1 week for reevaluation.  Return to ED if any symptoms worsen including new seizures, increased confusion, new fevers, severe headaches, syncopal episode.

## 2024-10-07 NOTE — ED Triage Notes (Signed)
 Pt arrives to triage via wheelchair. A caregiver to the pts mother reported to wife that the pt is not acting like himself. Pt seems to be confused and is not completing his sentences. Pt denies pain.

## 2024-10-07 NOTE — ED Provider Notes (Signed)
 [place] EMERGENCY DEPARTMENT AT [facility] Provider Note   CSN: [id number] Arrival date & time: 10/07/24  1629     Patient presents with: Altered Mental Status   [name] is a 68 y.o. male.  Patient is a 68 year old male with a history of type 2 diabetes, BPH, Crohn's disease, seizure disorder, and subarachnoid hemorrhage who presents to the ED with wife for altered mental status.  Patient notes that he woke up around 3 AM and thought something was going on with his mom.  He notes he was having difficulty speaking but does not believe it was him.  Wife states she last spoke to him at  930 pm  last evening and he was normal at that time.  The wife states the patient's mom called her because the patient was sitting in the floor staring at the ground and was not responding.  Wife called the nurse triage line and was told to go to the ED for evaluation.  Wife notes the patient is normally not like this and is able to carry on a conversation normally.  Patient notes he has been compliant with his medications.    Altered Mental Status Presenting symptoms: confusion   Associated symptoms: no abdominal pain, no fever, no headaches, no nausea, no seizures, no vomiting and no weakness        Prior to Admission medications   Medication Sig Start Date End Date Taking? Authorizing Provider  levETIRAcetam  (KEPPRA ) 1000 MG tablet Take 1 tablet (1,000 mg total) by mouth 2 (two) times daily. 10/07/24 01/05/25  Neysa Thersia RAMAN, PA-C  metFORMIN  (GLUCOPHAGE ) 500 MG tablet Take 1 tablet (500 mg total) by mouth 2 (two) times daily. 06/07/24 09/05/24  Rosario Leatrice FERNS, MD  tamsulosin  (FLOMAX ) 0.4 MG CAPS capsule Take 1 capsule (0.4 mg total) by mouth daily after supper. 06/07/24   Rosario Leatrice FERNS, MD    Allergies: Nsaids    Review of Systems  Constitutional:  Negative for chills and fever.  Respiratory:  Negative for shortness of breath.   Cardiovascular:  Negative for chest  pain.  Gastrointestinal:  Negative for abdominal pain, nausea and vomiting.  Neurological:  Positive for speech difficulty. Negative for dizziness, seizures, syncope, weakness and headaches.  Psychiatric/Behavioral:  Positive for confusion.   All other systems reviewed and are negative.   Updated Vital Signs BP 131/71 (BP Location: Right Arm)   Pulse 78   Temp 98.4 F (36.9 C) (Oral)   Resp 16   SpO2 97%   Physical Exam Constitutional:      Appearance: Normal appearance.  HENT:     Head: Normocephalic and atraumatic.     Nose: Nose normal.     Mouth/Throat:     Mouth: Mucous membranes are moist.     Pharynx: Oropharynx is clear.  Eyes:     Extraocular Movements: Extraocular movements intact.     Pupils: Pupils are equal, round, and reactive to light.  Cardiovascular:     Rate and Rhythm: Normal rate.  Pulmonary:     Effort: Pulmonary effort is normal.     Breath sounds: Normal breath sounds.  Skin:    General: Skin is warm and dry.  Neurological:     Mental Status: He is alert and oriented to person, place, and time.     Comments: Patient can answer questions but is slow to respond.  Dysarthria noted.  No slurred speech noted.  No other focal deficits noted.  Negative Romberg.  Normal finger-to-nose.  Equal strength bilaterally.  No cranial nerve deficits.     (all labs ordered are listed, but only abnormal results are displayed) Labs Reviewed  COMPREHENSIVE METABOLIC PANEL WITH GFR - Abnormal; Notable for the following components:      Result Value   Glucose, Bld 136 (*)    All other components within normal limits  CBC WITH DIFFERENTIAL/PLATELET - Abnormal; Notable for the following components:   Hemoglobin 12.9 (*)    All other components within normal limits  URINALYSIS, ROUTINE W REFLEX MICROSCOPIC  URINE DRUG SCREEN  PROTIME-INR  CBC WITH DIFFERENTIAL/PLATELET  LEVETIRACETAM  LEVEL  ETHANOL  TROPONIN T, HIGH SENSITIVITY    EKG: None  Radiology: CT  ANGIO HEAD NECK W WO CM Result Date: 10/07/2024 EXAM: CT HEAD WITHOUT CTA HEAD AND NECK WITH AND WITHOUT 10/07/2024 07:05:20 PM TECHNIQUE: CTA of the head and neck was performed with and without the administration of 75 mL of iohexol  (OMNIPAQUE ) 350 MG/ML injection. Noncontrast CT of the head with reconstructed 2-D images are also provided for review. Multiplanar 2D and/or 3D reformatted images are provided for review. Automated exposure control, iterative reconstruction, and/or weight based adjustment of the mA/kV was utilized to reduce the radiation dose to as low as reasonably achievable. COMPARISON: MRI head 06/11/2024 and CT head 06/10/2024. CLINICAL HISTORY: Neuro deficit, acute, stroke suspected. FINDINGS: CT HEAD: BRAIN AND VENTRICLES: No acute intracranial hemorrhage. 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 Mild chronic microvascular ischemia. No edema, mass effect, or midline shift. The basilar cisterns are patent. No extra-axial fluid collection. No evidence of acute infarct. No hydrocephalus. ORBITS: No acute abnormality. SINUSES AND MASTOIDS: No acute abnormality. CTA NECK: AORTIC ARCH AND ARCH VESSELS: No dissection or arterial injury. No significant stenosis of the brachiocephalic or subclavian arteries. CERVICAL CAROTID ARTERIES: Minimal atherosclerosis at the carotid bifurcations. There is no hemodynamically significant stenosis of the carotid arteries in the neck by NASCET criteria. CERVICAL VERTEBRAL ARTERIES: The left vertebral artery is dominant. The vertebral arteries are patent from the origins to the vertebrobasilar confluence. No dissection, arterial injury, or significant stenosis. LUNGS AND MEDIASTINUM: Unremarkable. SOFT TISSUES: Enlargement of the left thyroid  lobe. There are multiple thyroid  nodules noted with the largest in the anterior left thyroid  lobe measuring up to 1.5 cm. BONES: Degenerative changes in the visualized spine. CTA HEAD: ANTERIOR CIRCULATION:  Intracranial internal carotid arteries are patent bilaterally. The anterior cerebral arteries are patent bilaterally. The middle cerebral arteries are patent bilaterally. No aneurysm. POSTERIOR CIRCULATION: No significant stenosis of the posterior cerebral arteries. No significant stenosis of the basilar artery. No significant stenosis of the vertebral arteries. No aneurysm. OTHER: No dural venous sinus thrombosis on this non-dedicated study. IMPRESSION: 1. No acute intracranial abnormality. 2. No large vessel occlusion in the head or neck. 3. 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 4. Enlarged left thyroid  lobe with multiple thyroid  nodules, largest approximately 1.5 cm in the anterior left lobe. Recommend non-emergent thyroid  ultrasound per ACR guidelines. Electronically signed by: Donnice Mania MD 10/07/2024 07:44 PM EST RP Workstation: [id number]   DG Chest 2 View Result Date: 10/07/2024 EXAM: 2 VIEW(S) XRAY OF THE CHEST 10/07/2024 06:19:00 PM COMPARISON: 06/10/2024 CLINICAL HISTORY: weakness FINDINGS: LUNGS AND PLEURA: No focal pulmonary opacity. No pleural effusion. No pneumothorax. HEART AND MEDIASTINUM: No acute abnormality of the cardiac and mediastinal silhouettes. BONES AND SOFT TISSUES: Mild thoracic dextroscoliosis. IMPRESSION: 1. No acute cardiopulmonary process. Electronically signed by: Luke Bun MD 10/07/2024 06:40 PM EST  RP Workstation: [id number]      Medications Ordered in the ED  iohexol  (OMNIPAQUE ) 350 MG/ML injection 75 mL (75 mLs Intravenous Contrast Given 10/07/24 1856)  levETIRAcetam  (KEPPRA ) undiluted injection 1,000 mg (1,000 mg Intravenous Given 10/07/24 2030)    Clinical Course as of 10/07/24 2105  Tue Oct 07, 2024  2006 Spoke with Dr. Voncile on call with neurology: He reviewed patient's chart including previous EEG and neurology visits. Advised to give 1000mg  Keppra  dose, restart patient on medications, and can follow-up with [facility] in 1 week.   [AY]    Clinical Course User Index [AY] Neysa Thersia RAMAN, PA-C                                Medical Decision Making Amount and/or Complexity of Data Reviewed Labs: ordered. Radiology: ordered.  Risk Prescription drug management.   Patient is a 68 year old male with a history of type 2 diabetes, seizure disorder who presents to the ED with wife for altered mental status and difficulty speaking that began sometime in the middle of the night last evening.  Please see detailed HPI above.  On exam patient is alert and in no acute distress.  Physical exam as noted above.  He is having difficulty articulating words on initial examination but on reevaluation is back to his baseline.  No other acute neurologic deficits noted.  The wife does agree with this as well.  Lab workup overall unremarkable and reassuring.  EKG shows sinus rhythm.  Chest x-ray negative for acute process.  CT angio of the head and neck negative for acute intracranial abnormality or large vessel occlusion.  Differential includes breakthrough seizure, pseudoseizure, TIA, electrolyte abnormality, infectious process.  Case was discussed with neurologist on-call who reviewed patient's chart.  Patient has had similar episodes like this previously with the last being in July where he was admitted for TIA workup.  MRI was negative at that time.  As patient is back to baseline, neurology most likely agrees that he may have had a breakthrough seizure as he has been noncompliant with medication.  Recommended giving patient 1000 mg Keppra  dose, advising to restart on 1000 mg twice a day as previously prescribed and follow-up with his neurologist in 1 week.  Patient continues to be well-appearing with no deficits.  Stable for discharge home.  Refills on Keppra  provided.  Advised to follow-up with neurology in 1 week for reevaluation.  Return precautions provided.    Final diagnoses:  Confusion  Seizure disorder [facility])    ED Discharge  Orders          Ordered    levETIRAcetam  (KEPPRA ) 1000 MG tablet  2 times daily        10/07/24 2037               Neysa Thersia RAMAN, [place] 10/07/24 2105    Lenor Hollering, MD 10/07/24 2307

## 2024-10-07 NOTE — Telephone Encounter (Signed)
 FYI Only or Action Required?: FYI only for provider: ED advised.  Called Nurse Triage reporting Altered Mental Status.  Symptoms began today.  Interventions attempted: Nothing.  Symptoms are: gradually worsening.  Triage Disposition: Go to ED Now (Notify PCP)  Patient/caregiver understands and will follow disposition?: Yes, will follow disposition  Copied from CRM #[id number]. Topic: Clinical - Red Word Triage >> Oct 07, 2024  3:22 PM Nathanel DEL wrote: Red Word that prompted transfer to Nurse Triage: Mrs Burrows calling concerned for her husband on the community line.  He is staring off into space. Not talking except saying strange things.  He has had multiple seizures Reason for Disposition  Very strange or paranoid behavior  Answer Assessment - Initial Assessment Questions 1. LEVEL OF CONSCIOUSNESS: How are they (the patient) acting right now? (e.g., alert-oriented, confused, lethargic, stuporous, comatose)     Alert and oriented to people. Pt having episodes of staring and standing. Pt has hx of sz, per caller pt has [place] sz. RN advised ED dispo, call 911 if needed. Caller confirms understanding and is agreeable.  Protocols used: Confusion - Delirium-A-AH

## 2024-12-04 ENCOUNTER — Telehealth: Payer: Self-pay | Admitting: Adult Health

## 2024-12-04 NOTE — Telephone Encounter (Signed)
 I called pts wife.  He had episode of memory loss, confusion this morning when driving.  Wife was not sure if he was taking his medication levetiracetam  1000mg  po bid as he should.  Pt is living with his mother, as caregiver so she does not really know.  From ED visits this has happened previously.  He has not had any falls.  I relayed that he should not drive.  I made appt 12-15-2024 at 0915 with Harlene NP.   I told her that she can call [employer] about pt and what happened and let them know. If continued episodes seek care ED as we are not an acute care setting.   She verbalized understanding.

## 2024-12-04 NOTE — Telephone Encounter (Signed)
 Patient's wife said called [employer] and was advised neurologist fill out a re-evaluation form. If have any questions can call back.

## 2024-12-04 NOTE — Telephone Encounter (Signed)
 How would you like to proceed? I can pull the re-evaluation form to be filled if you need.

## 2024-12-04 NOTE — Telephone Encounter (Signed)
 Pt's wife LVM and I returned the call. She stated that the pt was driving with his son in the car and all of a sudden the pt forgot who he was, where he was and where he was going. Wife is wanting to speak to a nurse or provider because she does not know if he is having mini seizures or what is going on so she would like to be advised.

## 2024-12-04 NOTE — Telephone Encounter (Signed)
 Due to recent events since prior visit, request he be seen by Dr. Onita who also has an opening on 2/2. Thank you.

## 2024-12-08 NOTE — Telephone Encounter (Signed)
 Dr Onita has opening 2/3 at 130 pm. I called wife Karna (on [place]) and LVM informing her of appt change from [place] to Dr Onita, now on 2/3 at 1:30 pm check-in at 1:15. Office closed today but can call back tomorrow if appt does not work.

## 2024-12-15 ENCOUNTER — Ambulatory Visit: Admitting: Adult Health

## 2024-12-16 ENCOUNTER — Ambulatory Visit: Admitting: Neurology

## 2024-12-16 ENCOUNTER — Encounter: Payer: Self-pay | Admitting: Neurology

## 2024-12-16 ENCOUNTER — Other Ambulatory Visit: Payer: Self-pay

## 2024-12-16 VITALS — BP 135/84 | HR 87 | Ht 66.0 in | Wt 162.0 lb

## 2024-12-16 DIAGNOSIS — R413 Other amnesia: Secondary | ICD-10-CM | POA: Insufficient documentation

## 2024-12-16 DIAGNOSIS — G40909 Epilepsy, unspecified, not intractable, without status epilepticus: Secondary | ICD-10-CM | POA: Diagnosis not present

## 2024-12-16 DIAGNOSIS — R410 Disorientation, unspecified: Secondary | ICD-10-CM

## 2024-12-16 DIAGNOSIS — D509 Iron deficiency anemia, unspecified: Secondary | ICD-10-CM | POA: Diagnosis not present

## 2024-12-16 MED ORDER — LEVETIRACETAM 1000 MG PO TABS: 1000.0000 mg | ORAL_TABLET | Freq: Two times a day (BID) | ORAL | 3 refills | Status: AC

## 2024-12-19 ENCOUNTER — Telehealth: Payer: Self-pay | Admitting: Neurology

## 2024-12-19 LAB — IRON,TIBC AND FERRITIN PANEL
Ferritin: 52 ng/mL (ref 30–400)
Iron Saturation: 16 % (ref 15–55)
Iron: 47 ug/dL (ref 38–169)
Total Iron Binding Capacity: 294 ug/dL (ref 250–450)
UIBC: 247 ug/dL (ref 111–343)

## 2024-12-19 LAB — ATN PROFILE
A -- Beta-amyloid 42/40 Ratio: 0.112
Beta-amyloid 40: 219.29 pg/mL
Beta-amyloid 42: 24.58 pg/mL
N -- NfL, Plasma: 2.12 pg/mL (ref 0.00–3.65)
T -- p-tau181: 0.97 pg/mL (ref 0.00–0.97)

## 2024-12-19 LAB — FOLATE: Folate: 7.5 ng/mL

## 2024-12-19 LAB — SYPHILIS: RPR W/REFLEX TO RPR TITER AND TREPONEMAL ANTIBODIES, TRADITIONAL SCREENING AND DIAGNOSIS ALGORITHM: RPR Ser Ql: NONREACTIVE

## 2024-12-19 LAB — LEVETIRACETAM LEVEL: Levetiracetam Lvl: 24.2 ug/mL (ref 10.0–40.0)

## 2024-12-19 LAB — VITAMIN B12: Vitamin B-12: 334 pg/mL (ref 232–1245)

## 2024-12-19 NOTE — Telephone Encounter (Signed)
"  Patient voicemail is full  "

## 2024-12-19 NOTE — Telephone Encounter (Signed)
 Please call patient, laboratory evaluation showed no significant abnormalities.  He is to continue current medication, call clinic for recurrent confusion/seizure spells,

## 2025-03-09 ENCOUNTER — Ambulatory Visit: Admitting: Adult Health

## 2025-06-15 ENCOUNTER — Ambulatory Visit: Admitting: Neurology
